# Patient Record
Sex: Male | Born: 1975 | Race: White | Hispanic: No | Marital: Single | Smoking: Former smoker
Health system: Southern US, Community
[De-identification: ages and names within clinical notes are randomized; demographics above are authoritative.]

## PROBLEM LIST (undated history)

## (undated) DIAGNOSIS — G4730 Sleep apnea, unspecified: Secondary | ICD-10-CM

## (undated) DIAGNOSIS — F909 Attention-deficit hyperactivity disorder, unspecified type: Secondary | ICD-10-CM

## (undated) DIAGNOSIS — G4733 Obstructive sleep apnea (adult) (pediatric): Secondary | ICD-10-CM

## (undated) DIAGNOSIS — K5720 Diverticulitis of large intestine with perforation and abscess without bleeding: Secondary | ICD-10-CM

## (undated) DIAGNOSIS — E119 Type 2 diabetes mellitus without complications: Secondary | ICD-10-CM

## (undated) DIAGNOSIS — E785 Hyperlipidemia, unspecified: Secondary | ICD-10-CM

## (undated) DIAGNOSIS — Z860100 Personal history of colon polyps, unspecified: Secondary | ICD-10-CM

## (undated) DIAGNOSIS — G4700 Insomnia, unspecified: Secondary | ICD-10-CM

## (undated) DIAGNOSIS — K429 Umbilical hernia without obstruction or gangrene: Secondary | ICD-10-CM

## (undated) DIAGNOSIS — I10 Essential (primary) hypertension: Secondary | ICD-10-CM

## (undated) DIAGNOSIS — E669 Obesity, unspecified: Secondary | ICD-10-CM

## (undated) HISTORY — DX: Diverticulitis of large intestine with perforation and abscess without bleeding: K57.20

## (undated) HISTORY — DX: Essential (primary) hypertension: I10

## (undated) HISTORY — PX: LASIK: SHX215

## (undated) HISTORY — DX: Obesity, unspecified: E66.9

## (undated) HISTORY — PX: COLONOSCOPY: SHX174

## (undated) HISTORY — DX: Umbilical hernia without obstruction or gangrene: K42.9

## (undated) HISTORY — DX: Sleep apnea, unspecified: G47.30

## (undated) HISTORY — DX: Obstructive sleep apnea (adult) (pediatric): G47.33

## (undated) HISTORY — DX: Type 2 diabetes mellitus without complications: E11.9

## (undated) HISTORY — DX: Hyperlipidemia, unspecified: E78.5

## (undated) HISTORY — DX: Attention-deficit hyperactivity disorder, unspecified type: F90.9

## (undated) HISTORY — PX: EYE SURGERY: SHX253

## (undated) NOTE — *Deleted (*Deleted)
PCP - DR PAZ       HE IS AWARE OF SURGERY Cardiologist -NA     -  EKG - 04/20/20 Stress Test - NA ECHO NA-  Cardiac Cath - NA  Sleep Study - YES CPAP - YES  Fasting Blood Sugar - ? Checks Blood Sugar __2___ times a day  : ERAS Protcol -YES PRE-SURGERY Ensure or G2- WATER GIVE WITH INSTRUCTIONS  COVID TEST- FOR TODAY   Anesthesia review: HX HTN  Patient denies shortness of breath, fever, cough and chest pain at PAT appointment   All instructions explained to the patient, with a verbal understanding of the material. Patient agrees to go over the instructions while at home for a better understanding. Patient also instructed to self quarantine after being tested for COVID-19. The opportunity to ask questions was provided.

---

## 2006-06-11 ENCOUNTER — Emergency Department (HOSPITAL_COMMUNITY): Admission: EM | Admit: 2006-06-11 | Discharge: 2006-06-11 | Payer: Self-pay | Admitting: Family Medicine

## 2007-01-26 ENCOUNTER — Emergency Department (HOSPITAL_COMMUNITY): Admission: EM | Admit: 2007-01-26 | Discharge: 2007-01-26 | Payer: Self-pay | Admitting: Emergency Medicine

## 2007-08-05 ENCOUNTER — Emergency Department (HOSPITAL_COMMUNITY): Admission: EM | Admit: 2007-08-05 | Discharge: 2007-08-05 | Payer: Self-pay | Admitting: Emergency Medicine

## 2007-12-10 ENCOUNTER — Emergency Department (HOSPITAL_COMMUNITY): Admission: EM | Admit: 2007-12-10 | Discharge: 2007-12-10 | Payer: Self-pay | Admitting: Family Medicine

## 2009-05-10 ENCOUNTER — Emergency Department (HOSPITAL_COMMUNITY): Admission: EM | Admit: 2009-05-10 | Discharge: 2009-05-10 | Payer: Self-pay | Admitting: Family Medicine

## 2009-10-17 ENCOUNTER — Emergency Department (HOSPITAL_COMMUNITY): Admission: EM | Admit: 2009-10-17 | Discharge: 2009-10-17 | Payer: Self-pay | Admitting: Family Medicine

## 2010-06-29 ENCOUNTER — Ambulatory Visit
Admission: RE | Admit: 2010-06-29 | Discharge: 2010-06-29 | Payer: Self-pay | Source: Home / Self Care | Attending: Internal Medicine | Admitting: Internal Medicine

## 2010-06-29 ENCOUNTER — Encounter: Payer: Self-pay | Admitting: Internal Medicine

## 2010-06-29 DIAGNOSIS — F17200 Nicotine dependence, unspecified, uncomplicated: Secondary | ICD-10-CM | POA: Insufficient documentation

## 2010-06-29 LAB — CONVERTED CEMR LAB
ALT: 27 units/L (ref 0–53)
AST: 18 units/L (ref 0–37)
Albumin: 5.1 g/dL (ref 3.5–5.2)
Alkaline Phosphatase: 84 units/L (ref 39–117)
BUN: 22 mg/dL (ref 6–23)
Basophils Absolute: 0.1 10*3/uL (ref 0.0–0.1)
Basophils Relative: 1 % (ref 0–1)
Bilirubin, Direct: 0.1 mg/dL (ref 0.0–0.3)
CO2: 21 meq/L (ref 19–32)
Calcium: 10.1 mg/dL (ref 8.4–10.5)
Chloride: 104 meq/L (ref 96–112)
Cholesterol: 236 mg/dL — ABNORMAL HIGH (ref 0–200)
Creatinine, Ser: 0.93 mg/dL (ref 0.40–1.50)
Eosinophils Absolute: 0.4 10*3/uL (ref 0.0–0.7)
Eosinophils Relative: 4 % (ref 0–5)
Glucose, Bld: 86 mg/dL (ref 70–99)
HCT: 47.7 % (ref 39.0–52.0)
HDL: 31 mg/dL — ABNORMAL LOW (ref >39)
Hemoglobin: 16.4 g/dL (ref 13.0–17.0)
Indirect Bilirubin: 0.4 mg/dL (ref 0.0–0.9)
LDL Cholesterol: 162 mg/dL — ABNORMAL HIGH (ref 0–99)
Lymphocytes Relative: 43 % (ref 12–46)
Lymphs Abs: 4.7 10*3/uL — ABNORMAL HIGH (ref 0.7–4.0)
MCHC: 34.4 g/dL (ref 30.0–36.0)
MCV: 84 fL (ref 78.0–100.0)
Monocytes Absolute: 0.7 10*3/uL (ref 0.1–1.0)
Monocytes Relative: 6 % (ref 3–12)
Neutro Abs: 5.1 10*3/uL (ref 1.7–7.7)
Neutrophils Relative %: 47 % (ref 43–77)
Platelets: 222 10*3/uL (ref 150–400)
Potassium: 4.7 meq/L (ref 3.5–5.3)
RBC: 5.68 M/uL (ref 4.22–5.81)
RDW: 14.3 % (ref 11.5–15.5)
Sodium: 139 meq/L (ref 135–145)
TSH: 1.99 microintl units/mL (ref 0.350–4.500)
Total Bilirubin: 0.5 mg/dL (ref 0.3–1.2)
Total CHOL/HDL Ratio: 7.6
Total Protein: 7.9 g/dL (ref 6.0–8.3)
Triglycerides: 214 mg/dL — ABNORMAL HIGH (ref <150)
Uric Acid, Serum: 9.8 mg/dL — ABNORMAL HIGH (ref 4.0–7.8)
VLDL: 43 mg/dL — ABNORMAL HIGH (ref 0–40)
WBC: 10.9 10*3/uL — ABNORMAL HIGH (ref 4.0–10.5)

## 2010-07-03 ENCOUNTER — Telehealth: Payer: Self-pay | Admitting: Internal Medicine

## 2010-07-17 ENCOUNTER — Encounter: Payer: Self-pay | Admitting: Internal Medicine

## 2010-07-19 NOTE — Progress Notes (Signed)
Summary: Lab Results Request/Nutrition Referral  Phone Note Call from Patient Call back at Home Phone [phone]   Caller: Patient Summary of Call: Message left on triage voicemail: Patient was seen Friday and would like to know if lab results avaliable. Patient was also told to see Nutritionist off [place], patient would like contact info or was we going to set up.   Marland KitchenShonna Chock CMA  July 03, 2010 1:36 PM   Follow-up for Phone Call        advise patient: cholesterol and uric acid moderately elevated ----> diet and weight loss will  help to get  these numbers down arrange a referral to see a nutritionist, try Claude [facility] at [place] , [facility]. WBCs are slightly elevated, needs to beRecheck when he comes back in 6 months  Follow-up by: Yitta Gongaware E. Radonna Bracher MD,  July 03, 2010 5:03 PM  Additional Follow-up for Phone Call Additional follow up Details #1::        I spoke with pt he is aware. Referral put in. Army Fossa CMA  July 04, 2010 8:55 AM \\par 

## 2010-07-19 NOTE — Assessment & Plan Note (Signed)
Summary: new to estab//ph   Vital Signs:  Patient profile:   35 year old male Height:      70.25 inches Weight:      315.50 pounds BMI:     45.11 Pulse rate:   90 / minute Pulse rhythm:   regular BP sitting:   134 / 90  (left arm) Cuff size:   regular  Vitals Entered By: Army Fossa CMA (June 29, 2010 2:47 PM) `CC: New to est. CPX Comments discuss quiting smoking possible hernia? [facility] [place] [facility] td[facility] today   History of Present Illness: CPX tried  Chantix before, would like another prescription. He was able to quit for 9 months, had no side effects except vivid dreams Interested in losing weight, admits to  no exercise, diet is "okay" but admits to need a better portion control noticed a umbilical hernia in November 2011, able to push it in without problems  Preventive Screening-Counseling & Management  Alcohol-Tobacco     Alcohol type: socially      Smoking Status: current     Packs/Day: 1 pack  Caffeine-Diet-Exercise     Does Patient Exercise: no      Drug Use:  no.    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 , started age 89 Alcohol use--yes, socially  Drug use-no Regular exercise-no  Risk Factors: Exercise: no (06/29/2010)  Risk Factors: Smoking Status: current (06/29/2010) Packs/Day: 1 pack (06/29/2010)  Past Medical History: Gout  Past Surgical History: no major   Family History: DM-- G aunt   Hypertension-- M MI-- M side  Stroke-- ?F Lung cancer-- MGF colon ca-- no prostate ca--no  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 CV:  Denies chest pain or discomfort and swelling of feet. Resp:  Denies cough, shortness of breath, and wheezing. GI:  Denies bloody stools, diarrhea, and nausea. GU:  Denies dysuria, hematuria, urinary frequency, and urinary hesitancy. Psych:  Denies anxiety and depression.  Physical Exam  General:  alert, well-developed, and overweight-appearing.   Neck:  no masses and no thyromegaly.   Lungs:  normal respiratory effort, no intercostal retractions, no accessory muscle use, and normal breath sounds.   Heart:  normal rate, regular rhythm, no murmur, and no gallop.   Abdomen:  soft, non-tender, no distention, no masses, no guarding, and no rigidity.  1.5 inch reducible, nontender umbilical hernia Extremities:  no lower extremity edema Psych:  Cognition and judgment appear intact. Alert and cooperative with normal attention span and concentration.  not anxious appearing and not depressed appearing.     Impression & Recommendations:  Problem # 1:  ROUTINE GENERAL MEDICAL EXAM@HEALTH  CARE FACL (ICD-V70.0) Td today got a flu shot this year I counseled the patient about exercise With her about different options for a healthier diet: Self reading, nutritionist referral, Weight Watchers, Optifast. He elected self education.  Orders: Venipuncture (59563) Specimen Handling (87564)  Problem # 2:  TOBACCO USER (  ICD-305.1) counseled Chantix  His updated medication list for this problem includes:    Chantix Starting Month Pak 0.5 Mg X 11 & 1 Mg X 42 Tabs (Varenicline tartrate) .Marland Kitchen... As directed    Chantix 1 Mg Tabs (Varenicline tartrate) .Marland Kitchen... 1 by mouth two times a day umbilical hernia options: Observation versus surgical referral Elected observation Aware of incarceration symptoms  Problem # 4:   GOUT (ICD-274.9) has episodes very seldom Labs Orders: Specimen Handling (60454)  Complete Medication List: 1)  Chantix Starting Month Pak 0.5 Mg X 11 & 1 Mg X 42 Tabs (Varenicline tartrate) .... As directed 2)  Chantix 1 Mg Tabs (Varenicline tartrate) .Marland Kitchen.. 1 by mouth two times a day  Other Orders: Tdap => 54yrs IM (09811) Admin 1st Vaccine (91478)  Patient Instructions: 1)  Please schedule a follow-up appointment in 6 months  to recheck your BP 2)  chantix x 3 months 3)  quit 2 weeks after you start Prescriptions: CHANTIX 1 MG TABS (VARENICLINE TARTRATE) 1 by mouth two times a day  #60 x 1   Entered and Authorized by:   Elita Quick E. Kaoir Loree MD   Signed by:   Nolon Rod. Topacio Cella MD on 06/29/2010   Method used:   Print then Give to Patient   RxID:   [id number] CHANTIX STARTING MONTH PAK 0.5 MG X 11 & 1 MG X 42 TABS (VARENICLINE TARTRATE) as directed  #1 x 0   Entered and Authorized by:   Nolon Rod. Devin Ganaway MD   Signed by:   Nolon Rod. Donnita Farina MD on 06/29/2010   Method used:   Print then Give to Patient   RxID:   [id number]    Orders Added: 1)  Tdap => 66yrs IM [90715] 2)  Admin 1st Vaccine [90471] 3)  Venipuncture [72536] 4)  Specimen Handling [99000] 5)  New Patient 18-39 years [99385]   Immunizations Administered:  Tetanus Vaccine:    Vaccine Type: Tdap    Site: right deltoid    Mfr: GlaxoSmithKline    Dose: 0.5 ml    Route: IM    Given by: Army Fossa CMA    Exp. Date: 04/05/2012    Lot #: [id number]   Immunizations Administered:  Tetanus Vaccine:    Vaccine Type: Tdap    Site: right deltoid    Mfr: GlaxoSmithKline    Dose: 0.5 ml    Route: IM    Given by: Army Fossa CMA    Exp. Date: 04/05/2012    Lot #: [id number]   Risk Factors:  Tobacco use:  current    Cigarettes:  Yes -- 1 pack pack(s) per day Drug use:  no Alcohol use:  yes    Type:  socially  Exercise:  no

## 2010-08-08 NOTE — Consult Note (Signed)
Summary: Dr. [facility]  Dr. [facility]   Imported By: Maryln Gottron 07/24/2010 15:32:15  _____________________________________________________________________  External Attachment:    Type:   Image     Comment:   External Document

## 2010-08-30 ENCOUNTER — Telehealth: Payer: Self-pay | Admitting: Internal Medicine

## 2010-09-03 ENCOUNTER — Telehealth: Payer: Self-pay | Admitting: *Deleted

## 2010-09-04 NOTE — Progress Notes (Signed)
Summary: problems w/ med   Phone Note Call from Patient Call back at Home Phone [phone]   Caller: Patient Summary of Call: Spoke w/ patient would like to know what he could do says that Chantix is making him lose focus and finding it difficult to concentrate since he is in school. Says that he has been diagnosed w/ ADD in the past. Although he has stopped smoking would like to know if there are any other decision about something he could take that may help w/ smoking cessation and concentration. Initial call taken by: Doristine Devoid CMA,  August 30, 2010 2:57 PM  Follow-up for Phone Call         if he feels that the lack of concentration is due to Chantix,  he needs to be focused due to the school,  Then he needs to discontinue Chantix (unfortunately).  We could try wellbutrin  100 mg twice a day to help with tobacco cessation.  if he has chronic lack of concentration and suspect ADHD, he needs to see psychology , confirm the diagnoses and be treated. Follow-up by: Nolon Rod. Bernadett Milian MD,  August 31, 2010 12:19 PM  Additional Follow-up for Phone Call Additional follow up Details #1::        I spoke w/ pt he states that he is going to continue w/ the Chantix this weekend, if he notices that his focus is still a problem then he will call Monday and get the Wellbutrin. Army Fossa CMA  August 31, 2010 1:19 PM

## 2010-09-13 NOTE — Progress Notes (Signed)
  Phone Note Call from Patient Call back at [facility] Phone [phone]   Summary of Call: Pt called back and states he would like to try the Wellbutrin and he needs a referral to a Psycholigist.   New Problems: * ?ADHD   New Problems: * ?ADHD New/Updated Medications: WELLBUTRIN 100 MG TABS (BUPROPION HCL) 1 by mouth two times a day. Prescriptions: WELLBUTRIN 100 MG TABS (BUPROPION HCL) 1 by mouth two times a day.  #60 x 0   Entered by:   Army Fossa CMA   Authorized by:   Nolon Rod. Paz MD   Signed by:   Army Fossa CMA on 09/03/2010   Method used:   Electronically to        [facility] [phone]* (retail)       [address]       [place], [place]  [address]       Ph: [phone]       Fax: [phone]   RxID:   [id number]

## 2011-03-11 LAB — CULTURE, FUNGUS WITHOUT SMEAR

## 2011-03-11 LAB — KOH PREP

## 2011-03-29 ENCOUNTER — Encounter: Payer: Self-pay | Admitting: Internal Medicine

## 2011-03-29 ENCOUNTER — Ambulatory Visit (INDEPENDENT_AMBULATORY_CARE_PROVIDER_SITE_OTHER): Payer: BC Managed Care – PPO | Admitting: Internal Medicine

## 2011-03-29 VITALS — BP 128/82 | HR 111 | Temp 98.3°F | Resp 18 | Wt 331.0 lb

## 2011-03-29 DIAGNOSIS — M109 Gout, unspecified: Secondary | ICD-10-CM

## 2011-03-29 DIAGNOSIS — F17200 Nicotine dependence, unspecified, uncomplicated: Secondary | ICD-10-CM

## 2011-03-29 DIAGNOSIS — R059 Cough, unspecified: Secondary | ICD-10-CM

## 2011-03-29 DIAGNOSIS — R05 Cough: Secondary | ICD-10-CM | POA: Insufficient documentation

## 2011-03-29 LAB — URIC ACID: Uric Acid, Serum: 8.5 mg/dL — ABNORMAL HIGH (ref 4.0–7.8)

## 2011-03-29 MED ORDER — AZITHROMYCIN 250 MG PO TABS: ORAL_TABLET | ORAL | Status: AC

## 2011-03-29 MED ORDER — VARENICLINE TARTRATE 0.5 MG X 11 & 1 MG X 42 PO MISC: ORAL | Status: AC

## 2011-03-29 NOTE — Assessment & Plan Note (Addendum)
See HPI, ready to try chantix x 1 month, then is considering wellbutrin Plan: chantix x 1 months Then will call : change to wellbutrin or take 2 months of additional chantix?

## 2011-03-29 NOTE — Patient Instructions (Signed)
For cough, take Mucinex DM twice a day as needed  Take the antibiotic as prescribed. zithromax Call if no better in few days Call anytime if the symptoms are severe Call after 1 month of Chantix : 2 additional months of chantix? Change to Wellbutrin? You are due for a physical at your convenience

## 2011-03-29 NOTE — Progress Notes (Signed)
  Subjective:    Patient ID: Spencer Good, male    DOB: 1976-03-05, 35 y.o.   MRN: [id number]  HPI Several issues A month ago developed a cold, it is better but continue with cough with occasional sputum production, sometimes green other times yellow. Tobacco abuse, would like to retry Chantix, in the past it helped but he had to switch to Wellbutrin because it was affecting his study habits. He still is in a [facility] program and needs to be very concentrated on reading. History of gout, would like his uric acid check, concerned about the gout causing inflammation.  Past Medical History  Diagnosis Date  . Gout   . Tobacco abuse    Past Surgical History  Procedure Date  . No past surgeries     Review of Systems Denies any fever or chills No chest congestion or wheezing No sinus pain but some congestion without postnasal dripping. Denies any podagra or acute joint pains in the last few months.     Objective:   Physical Exam  Constitutional: He is oriented to person, place, and time. He appears well-developed. No distress.       Obese appearing  HENT:  Head: Normocephalic and atraumatic.  Right Ear: External ear normal.  Left Ear: External ear normal.  Mouth/Throat: No oropharyngeal exudate.       Face symmetric, not tender to palpation  Cardiovascular: Normal rate, regular rhythm and normal heart sounds.   No murmur heard. Pulmonary/Chest: Effort normal. No respiratory distress. He has no wheezes. He has no rales.       Few rhonchi with cough, no wheezing.  Musculoskeletal: He exhibits no edema.  Neurological: He is alert and oriented to person, place, and time.  Skin: He is not diaphoretic.  Psychiatric: He has a normal mood and affect. His behavior is normal. Thought content normal.       Assessment & Plan:

## 2011-03-29 NOTE — Assessment & Plan Note (Addendum)
H/o elevated uric acid w/o obvious gout attacks. Patient wonder if elevated uric acid is causing "inflammation", I think he needs to see the bigger picture---> "inflammation" (increased CRP)  is likely cause by sedentary lifestyle, obesity and smoking. Check U acid, will consider treatment with medication only if it is extremely high. Has  indocin left over that he uses as needed

## 2011-03-29 NOTE — Assessment & Plan Note (Signed)
Atypical bronchitis? See instructions

## 2011-03-30 ENCOUNTER — Encounter: Payer: Self-pay | Admitting: Internal Medicine

## 2011-06-06 ENCOUNTER — Telehealth: Payer: Self-pay | Admitting: Internal Medicine

## 2011-06-06 NOTE — Telephone Encounter (Signed)
Patient has about a 5 days of chantix left - he would like to switch to welbutrin - [facility] he said dr Drue Novel had suggested welbutrin at [facility]

## 2011-06-07 MED ORDER — BUPROPION HCL 100 MG PO TABS: ORAL_TABLET | ORAL | Status: DC

## 2011-06-07 NOTE — Telephone Encounter (Signed)
Once he finish chantix, ok to start Bupropion 100 mg  1 a day x 2 weeks, then 1 po bid #60, 1 RF (notice is the plain bupropion not the SR or XL) Needs OV within 2 months

## 2011-06-07 NOTE — Telephone Encounter (Signed)
Discuss with patient Rx sent to pharmacy.

## 2011-07-08 ENCOUNTER — Ambulatory Visit (INDEPENDENT_AMBULATORY_CARE_PROVIDER_SITE_OTHER): Payer: BC Managed Care – PPO | Admitting: Internal Medicine

## 2011-07-08 ENCOUNTER — Encounter: Payer: Self-pay | Admitting: Internal Medicine

## 2011-07-08 DIAGNOSIS — Z0000 Encounter for general adult medical examination without abnormal findings: Secondary | ICD-10-CM

## 2011-07-08 DIAGNOSIS — Z23 Encounter for immunization: Secondary | ICD-10-CM

## 2011-07-08 DIAGNOSIS — F17200 Nicotine dependence, unspecified, uncomplicated: Secondary | ICD-10-CM

## 2011-07-08 LAB — CBC WITH DIFFERENTIAL/PLATELET
Basophils Absolute: 0.1 10*3/uL (ref 0.0–0.1)
Basophils Relative: 0.7 % (ref 0.0–3.0)
Eosinophils Absolute: 0.3 10*3/uL (ref 0.0–0.7)
Eosinophils Relative: 3 % (ref 0.0–5.0)
HCT: 43.8 % (ref 39.0–52.0)
Hemoglobin: 15 g/dL (ref 13.0–17.0)
Lymphocytes Relative: 37 % (ref 12.0–46.0)
Lymphs Abs: 3.3 10*3/uL (ref 0.7–4.0)
MCHC: 34.2 g/dL (ref 30.0–36.0)
MCV: 85.2 fl (ref 78.0–100.0)
Monocytes Absolute: 0.6 10*3/uL (ref 0.1–1.0)
Monocytes Relative: 6.3 % (ref 3.0–12.0)
Neutro Abs: 4.8 10*3/uL (ref 1.4–7.7)
Neutrophils Relative %: 53 % (ref 43.0–77.0)
Platelets: 216 10*3/uL (ref 150.0–400.0)
RBC: 5.14 Mil/uL (ref 4.22–5.81)
RDW: 14.3 % (ref 11.5–14.6)
WBC: 9 10*3/uL (ref 4.5–10.5)

## 2011-07-08 LAB — LIPID PANEL
Cholesterol: 216 mg/dL — ABNORMAL HIGH (ref 0–200)
HDL: 32 mg/dL — ABNORMAL LOW (ref >39.00)
Total CHOL/HDL Ratio: 7
Triglycerides: 241 mg/dL — ABNORMAL HIGH (ref 0.0–149.0)
VLDL: 48.2 mg/dL — ABNORMAL HIGH (ref 0.0–40.0)

## 2011-07-08 LAB — COMPREHENSIVE METABOLIC PANEL
ALT: 50 U/L (ref 0–53)
AST: 29 U/L (ref 0–37)
Albumin: 4.3 g/dL (ref 3.5–5.2)
Alkaline Phosphatase: 78 U/L (ref 39–117)
BUN: 17 mg/dL (ref 6–23)
CO2: 25 mEq/L (ref 19–32)
Calcium: 9 mg/dL (ref 8.4–10.5)
Chloride: 102 mEq/L (ref 96–112)
Creatinine, Ser: 1 mg/dL (ref 0.4–1.5)
GFR: 92.3 mL/min (ref >60.00)
Glucose, Bld: 94 mg/dL (ref 70–99)
Potassium: 3.9 mEq/L (ref 3.5–5.1)
Sodium: 138 mEq/L (ref 135–145)
Total Bilirubin: 0.6 mg/dL (ref 0.3–1.2)
Total Protein: 7.6 g/dL (ref 6.0–8.3)

## 2011-07-08 LAB — TSH: TSH: 2.73 u[IU]/mL (ref 0.35–5.50)

## 2011-07-08 LAB — LDL CHOLESTEROL, DIRECT: Direct LDL: 145.3 mg/dL

## 2011-07-08 MED ORDER — HYDROCORTISONE 1 % EX LOTN: TOPICAL_LOTION | Freq: Every day | CUTANEOUS | Status: DC

## 2011-07-08 NOTE — Assessment & Plan Note (Addendum)
Developed a facial rash consistent with seborrheic dermatitis. He also has some dryness in the scalp (Symptoms as noted before Wellbutrin, doubt allergies). We'll try steroids, recheck in 2 months Discontinue vit E

## 2011-07-08 NOTE — Assessment & Plan Note (Signed)
Patient quit tobacco with the help of Chantix and now still on  Wellbutrin. Praised, RTC 3 months

## 2011-07-08 NOTE — Assessment & Plan Note (Addendum)
Td 2012  got a flu shot today Patient quit tobacco with the help of Chantix and now still on  Wellbutrin. Praised, RTC 3 months Has also quit caffeine 2 weeks ago. We discussed diet and exercise Labs

## 2011-07-08 NOTE — Progress Notes (Signed)
  Subjective:    Patient ID: Spencer Good, male    DOB: 05-15-1976, 36 y.o.   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 He is also taking vyvanse as prescribed by psychiatry, his psychiatrist is aware that he is taking Wellbutrin. Developed a rash in the face and scalp, is itchy and scaly. Symptoms started November 2012. No chest pain or shortness of breath. No cough No nausea, vomiting, diarrhea. No ascites or depression per se.     Objective:   Physical Exam  Constitutional: He is oriented to person, place, and time. He appears well-developed. No distress.       Overweight appearing  HENT:  Head: Normocephalic and atraumatic.  Neck: No thyromegaly present.  Cardiovascular: Normal rate, regular rhythm and normal heart sounds.   No murmur heard. Pulmonary/Chest: Effort normal and breath sounds normal. No respiratory distress. He has no wheezes. He has no rales.  Abdominal: Soft. Bowel sounds are normal. He exhibits no distension. There is no tenderness. There is no rebound and no guarding.       Has a reducible umbilical hernia  Musculoskeletal: He exhibits no edema.  Neurological: He is alert and oriented to person, place, and time.  Skin: He is not diaphoretic.       Erythematous macular scaly rash on the face, mostly around the nose and eyebrows. Also in the ear; similar but milder findings in the scalp   Psychiatric: He has a normal mood and affect. His behavior is  normal. Judgment and thought content normal.      Assessment & Plan:

## 2011-07-11 ENCOUNTER — Encounter: Payer: Self-pay | Admitting: Internal Medicine

## 2011-08-02 ENCOUNTER — Telehealth: Payer: Self-pay | Admitting: Internal Medicine

## 2011-08-02 ENCOUNTER — Ambulatory Visit (INDEPENDENT_AMBULATORY_CARE_PROVIDER_SITE_OTHER): Payer: BC Managed Care – PPO | Admitting: Family Medicine

## 2011-08-02 ENCOUNTER — Encounter: Payer: Self-pay | Admitting: Family Medicine

## 2011-08-02 DIAGNOSIS — L219 Seborrheic dermatitis, unspecified: Secondary | ICD-10-CM

## 2011-08-02 MED ORDER — CICLOPIROX 1 % EX SHAM: 1.0000 "application " | MEDICATED_SHAMPOO | Freq: Every day | CUTANEOUS | Status: AC

## 2011-08-02 NOTE — Patient Instructions (Signed)
Use the shampoo daily x1 week and then twice weekly This can also be applied to eyebrows and beard Call with any questions or concerns Hang in there!

## 2011-08-02 NOTE — Telephone Encounter (Signed)
Patient has an appointment this afternoon with Dr. Katharina Caper Triage Call Report Triage Record Num: [id number] Operator: Caswell Corwin Patient Name: Spencer Good Call Date & Time: 08/02/2011 12:00:47PM Patient Phone: [phone] PCP: Nolon Rod. Paz Patient Gender: Male PCP Fax : Patient DOB: September 03, 1975 Practice Name: [facility] Reason for Call: Caller: [name]/Patient; PCP: Willow Ora; CB#: [phone]; ; ; Call regarding Rash/Hives on the face,scalp and ears. Started 11/12. The itching is intermittent and skin is flaking continuously. Pt saw Dr. Sherlyn Lick 1/13 and inst to use Hydrocortisone cream and not working. Pt has a F/U appt 09/05/11, but rash is worse now. Needs to be seen in 4 hrs. Appt made with Tabori at 1400 today. Protocol(s) Used: Rash Recommended Outcome per Protocol: See Provider within 4 hours Reason for Outcome: Evaluated by provider AND symptoms worsening when following recommended treatment plan Care Advice: ~

## 2011-08-02 NOTE — Progress Notes (Signed)
  Subjective:    Patient ID: Spencer Good, male    DOB: [birth year], 36 y.o.   MRN: [id number]  HPI Facial 'rash'- reports sxs started in Nov.  Tried OTC lotion, dandruff shampoos, hydrocortisone cream w/out relief.  Flaking in eyebrows, beard, nose, scalp, ears.  Very itchy.  Embarrassed by sxs.   Review of Systems For ROS see HPI     Objective:   Physical Exam  Vitals reviewed. Constitutional: He appears well-developed and well-nourished.  Skin: Skin is warm and dry. Rash noted. There is erythema (pt w/ flaking and scaling of eyebrows, beard, and scalp).          Assessment & Plan:

## 2011-08-04 NOTE — Assessment & Plan Note (Signed)
Deteriorated.  Start ciclopirox to treat severe sxs.  Reviewed supportive care and red flags that should prompt return.  Pt expressed understanding and is in agreement w/ plan.

## 2011-08-14 ENCOUNTER — Other Ambulatory Visit: Payer: Self-pay | Admitting: Internal Medicine

## 2011-08-14 NOTE — Telephone Encounter (Signed)
Refill done.  

## 2011-08-27 ENCOUNTER — Telehealth: Payer: Self-pay | Admitting: *Deleted

## 2011-08-27 NOTE — Telephone Encounter (Signed)
No appt available today with any providers. Pt does have pending appt schedule for 08-29-11.Marland KitchenPlease advise

## 2011-08-27 NOTE — Telephone Encounter (Signed)
Discuss with patient, appt scheduled. 

## 2011-08-27 NOTE — Telephone Encounter (Signed)
[facility] Triage Call Report Triage Record Num: [id number] Operator: Di Kindle Patient Name: Spencer Good" [name] Call Date & Time: 08/27/2011 11:32:09AM Patient Phone: [phone] PCP: Nolon Rod. Paz Patient Gender: Male PCP Fax : Patient DOB: 03/02/1976 Practice Name: [facility] Reason for Call: Caller: [name]/Patient; PCP: Willow Ora; CB#: [phone]; Call regarding Cough/Congestion; onset ` 1 week, using OTC, with acid reflux. Green productive cough, has been a smoker, stopped last three months. Guideline: Cough, with color, appt advised to be seen, offered appt 08/28/11, pt declines due to being out of town, appt 08/29/11 @ 1130 with Dr Laury Axon. OFFICE PLEASE: PT requests call if appt becomes avaialable 08/27/11, please Protocol(s) Used: Cough - Adult Recommended Outcome per Protocol: See Provider within 24 hours Reason for Outcome: Productive cough with colored sputum (other than clear or white sputum) Care Advice: ~ Use a cool mist humidifier to moisten air. Be sure to clean according to manufacturer's instructions. Call provider if fever greater than 101.5 F (38.6 C) or 100.5 F (38.1C) in an immunocompromised patient (such as diabetes, HIV/AIDS, renal disease, chemotherapy, organ transplant, or chronic steroid use) has not improved in 24 hours.

## 2011-08-27 NOTE — Telephone Encounter (Signed)
Patient returned your call.

## 2011-08-27 NOTE — Telephone Encounter (Signed)
Add on tomorrow at 4pm if he still needs to be seen; UC is an option as well

## 2011-08-28 ENCOUNTER — Ambulatory Visit (INDEPENDENT_AMBULATORY_CARE_PROVIDER_SITE_OTHER): Payer: BC Managed Care – PPO | Admitting: Internal Medicine

## 2011-08-28 VITALS — BP 168/96 | HR 108 | Temp 98.3°F | Wt 350.0 lb

## 2011-08-28 DIAGNOSIS — F17200 Nicotine dependence, unspecified, uncomplicated: Secondary | ICD-10-CM

## 2011-08-28 DIAGNOSIS — R000 Tachycardia, unspecified: Secondary | ICD-10-CM

## 2011-08-28 DIAGNOSIS — J40 Bronchitis, not specified as acute or chronic: Secondary | ICD-10-CM

## 2011-08-28 MED ORDER — AZITHROMYCIN 250 MG PO TABS: ORAL_TABLET | ORAL | Status: AC

## 2011-08-28 NOTE — Patient Instructions (Signed)
Rest, fluids , tylenol For cough, take Mucinex DM twice a day as needed   Take the antibiotic as prescribed  (zithromax ) Call if no better in few days Call anytime if the symptoms are severe  

## 2011-08-28 NOTE — Progress Notes (Signed)
  Subjective:    Patient ID: Spencer Good, male    DOB: Nov 07, 1975, 36 y.o.   MRN: [id number]  HPI Acute visit 5 days history of chest and nose congestion, mild aches and headache. Benadryl and Nasonex have not helped.  Past Medical History:  Gout  Past Surgical History:  no major   Review of Systems Quit tobacco a few months ago, doing great. Complaining of weight gain. Denies any fever or chills. Also having green nasal discharge He is having some on-and-off cough for the last 2 days with occasional green sputum. No wheezing. No nausea, vomiting, diarrhea    Objective:   Physical Exam Alert oriented x3, no apparent distress HEENT face symmetric, nontender to palpation, nose congested. Throat without redness. Ears without redness or bulging of the tympanic membranes. Lungs: Few rhonchi otherwise clear Cardiovascular : Tachycardic with a heart rate of around 100 when I checked, no murmur.       Assessment & Plan:  Bronchitis-- see instruction

## 2011-08-29 ENCOUNTER — Ambulatory Visit: Payer: BC Managed Care – PPO | Admitting: Family Medicine

## 2011-08-29 ENCOUNTER — Encounter: Payer: Self-pay | Admitting: Internal Medicine

## 2011-08-29 DIAGNOSIS — R000 Tachycardia, unspecified: Secondary | ICD-10-CM | POA: Insufficient documentation

## 2011-08-29 NOTE — Assessment & Plan Note (Signed)
Tachycardia, chronic, asx , recent labs normal EKG today-- sinus rhythm, rate 92. Due to Vyvanse ? Plan is observation

## 2011-08-29 NOTE — Assessment & Plan Note (Signed)
Quit 3months ago 

## 2011-09-05 ENCOUNTER — Ambulatory Visit: Payer: BC Managed Care – PPO | Admitting: Internal Medicine

## 2011-10-24 ENCOUNTER — Encounter: Payer: Self-pay | Admitting: Internal Medicine

## 2011-10-24 ENCOUNTER — Ambulatory Visit (INDEPENDENT_AMBULATORY_CARE_PROVIDER_SITE_OTHER): Payer: BC Managed Care – PPO | Admitting: Internal Medicine

## 2011-10-24 VITALS — BP 152/94 | HR 78 | Temp 98.1°F | Wt 355.0 lb

## 2011-10-24 DIAGNOSIS — J069 Acute upper respiratory infection, unspecified: Secondary | ICD-10-CM

## 2011-10-24 MED ORDER — PREDNISONE 10 MG PO TABS: ORAL_TABLET | ORAL | Status: DC

## 2011-10-24 MED ORDER — AMOXICILLIN 500 MG PO CAPS: 1000.0000 mg | ORAL_CAPSULE | Freq: Two times a day (BID) | ORAL | Status: AC

## 2011-10-24 MED ORDER — AZELASTINE HCL 0.1 % NA SOLN: 2.0000 | Freq: Two times a day (BID) | NASAL | Status: DC

## 2011-10-24 NOTE — Progress Notes (Signed)
  Subjective:    Patient ID: Spencer Good, male    DOB: [birth year], 36 y.o.   MRN: [id number]  HPI Acute visit Patient was taking Zyrtec daily and was doing well up until a week ago when he suddenly developed nose congestion, hoarseness, cough. 5 days ago he discontinue Zyrtec because he felt it wasn't helping. Symptoms continue about the same.  Past Medical History:  Gout  Past Surgical History:  no major    Review of Systems No fever or chills Some sneezing, no itchy eyes, nose is slightly irritated. No sore throat. Ears feel clogged and congested. Not able to sleep much at night due to the symptoms,specifically d/t nose congestion    Objective:   Physical Exam  General -- alert, well-developed, and overweight appearing. No apparent distress.  HEENT -- TM R slt red-bulge, no d/c, TM L wnl,  throat w/o redness, face symmetric and not tender to palpation, nose   congested Lungs -- normal respiratory effort, no intercostal retractions, no accessory muscle use, and few ronchi w/ cough.   Heart-- normal rate, regular rhythm, no murmur, and no gallop.   Psych-- Cognition and judgment appear intact. Alert and cooperative with normal attention span and concentration.  not anxious appearing and not depressed appearing.        Assessment & Plan:  URI-allergies: Is hard to distinguish between any upper respiratory infections or allergies. Will actually treat him for both. See instructions.

## 2011-10-24 NOTE — Patient Instructions (Signed)
Rest, fluids , go back to Zyrtec daily. For cough, take Mucinex DM twice a day as needed  For congestion use astelin nasal spray twice a day until you feel better Take the antibiotic as prescribed  (Amoxicillin) Prednisone as prescribed for 9 days Call if no better in few days Call anytime if the symptoms are severe

## 2011-12-13 ENCOUNTER — Other Ambulatory Visit: Payer: Self-pay | Admitting: Internal Medicine

## 2012-01-15 ENCOUNTER — Other Ambulatory Visit: Payer: Self-pay | Admitting: Internal Medicine

## 2012-01-15 NOTE — Telephone Encounter (Signed)
Refill done.  

## 2012-02-10 ENCOUNTER — Telehealth: Payer: Self-pay | Admitting: *Deleted

## 2012-02-10 NOTE — Telephone Encounter (Signed)
Discuss with patient  

## 2012-02-10 NOTE — Telephone Encounter (Signed)
Pt states that he was taking the Wellbutrin to quit smoking. Pt notes that he has since stop smoking and would like to know how to go about stopping the Wellbutrin.Please advise

## 2012-02-10 NOTE — Telephone Encounter (Signed)
Decrease to one tablet daily for 2 weeks, then stop Wellbutrin. Call if problems

## 2012-10-01 ENCOUNTER — Encounter: Payer: Self-pay | Admitting: Family Medicine

## 2012-10-01 ENCOUNTER — Ambulatory Visit (INDEPENDENT_AMBULATORY_CARE_PROVIDER_SITE_OTHER): Payer: BC Managed Care – PPO | Admitting: Family Medicine

## 2012-10-01 VITALS — BP 122/76 | HR 113 | Temp 99.1°F | Wt 352.4 lb

## 2012-10-01 DIAGNOSIS — J40 Bronchitis, not specified as acute or chronic: Secondary | ICD-10-CM

## 2012-10-01 MED ORDER — CEFUROXIME AXETIL 500 MG PO TABS: 500.0000 mg | ORAL_TABLET | Freq: Two times a day (BID) | ORAL | Status: DC

## 2012-10-01 MED ORDER — GUAIFENESIN-CODEINE 100-10 MG/5ML PO SYRP: ORAL_SOLUTION | ORAL | Status: DC

## 2012-10-01 MED ORDER — AZITHROMYCIN 250 MG PO TABS: ORAL_TABLET | ORAL | Status: DC

## 2012-10-01 NOTE — Progress Notes (Signed)
  Subjective:     Spencer Good is a 37 y.o. male who presents for evaluation of sinus pain. Symptoms include: congestion, cough, facial pain, nasal congestion and sinus pressure. Onset of symptoms was 3 weeks ago. Symptoms have been gradually worsening since that time. Past history is significant for no history of pneumonia or bronchitis. Patient is a former smoker.   The following portions of the patient's history were reviewed and updated as appropriate: allergies, current medications, past family history, past medical history, past social history, past surgical history and problem list.  Review of Systems Pertinent items are noted in HPI.   Objective:    BP 122/76  Pulse 113  Temp(Src) 99.1 F (37.3 C) (Oral)  Wt 352 lb 6.4 oz (159.848 kg)  BMI 47.78 kg/m2  SpO2 96% General appearance: alert, cooperative, appears stated age and no distress Ears: normal TM's and external ear canals both ears Nose: green discharge, mild congestion, turbinates red, swollen, sinus tenderness bilateral Throat: lips, mucosa, and tongue normal; teeth and gums normal Neck: no adenopathy, no carotid bruit, no JVD, supple, symmetrical, trachea midline and thyroid not enlarged, symmetric, no tenderness/mass/nodules Lungs: dec breath sounds Heart: S1, S2 normal    Assessment:    Acute bacterial bronchitis.    Plan:    Nasal steroids per medication orders. Antihistamines per medication orders. Zithromax per medication orders.

## 2012-10-01 NOTE — Patient Instructions (Addendum)

## 2012-12-01 ENCOUNTER — Encounter: Payer: Self-pay | Admitting: Internal Medicine

## 2012-12-01 ENCOUNTER — Ambulatory Visit (INDEPENDENT_AMBULATORY_CARE_PROVIDER_SITE_OTHER): Payer: BC Managed Care – PPO | Admitting: Internal Medicine

## 2012-12-01 VITALS — BP 132/82 | HR 96 | Temp 98.6°F | Ht 72.5 in | Wt 325.0 lb

## 2012-12-01 DIAGNOSIS — Z0000 Encounter for general adult medical examination without abnormal findings: Secondary | ICD-10-CM

## 2012-12-01 DIAGNOSIS — K429 Umbilical hernia without obstruction or gangrene: Secondary | ICD-10-CM | POA: Insufficient documentation

## 2012-12-01 DIAGNOSIS — F17200 Nicotine dependence, unspecified, uncomplicated: Secondary | ICD-10-CM

## 2012-12-01 DIAGNOSIS — F909 Attention-deficit hyperactivity disorder, unspecified type: Secondary | ICD-10-CM

## 2012-12-01 LAB — CBC WITH DIFFERENTIAL/PLATELET
Basophils Absolute: 0 10*3/uL (ref 0.0–0.1)
Basophils Relative: 0.3 % (ref 0.0–3.0)
Eosinophils Absolute: 0.4 10*3/uL (ref 0.0–0.7)
Eosinophils Relative: 3.3 % (ref 0.0–5.0)
HCT: 43 % (ref 39.0–52.0)
Hemoglobin: 14.6 g/dL (ref 13.0–17.0)
Lymphocytes Relative: 29.5 % (ref 12.0–46.0)
Lymphs Abs: 3.8 10*3/uL (ref 0.7–4.0)
MCHC: 33.9 g/dL (ref 30.0–36.0)
MCV: 84.3 fl (ref 78.0–100.0)
Monocytes Absolute: 0.7 10*3/uL (ref 0.1–1.0)
Monocytes Relative: 5.3 % (ref 3.0–12.0)
Neutro Abs: 8 10*3/uL — ABNORMAL HIGH (ref 1.4–7.7)
Neutrophils Relative %: 61.6 % (ref 43.0–77.0)
Platelets: 255 10*3/uL (ref 150.0–400.0)
RBC: 5.1 Mil/uL (ref 4.22–5.81)
RDW: 14.8 % — ABNORMAL HIGH (ref 11.5–14.6)
WBC: 12.9 10*3/uL — ABNORMAL HIGH (ref 4.5–10.5)

## 2012-12-01 LAB — COMPREHENSIVE METABOLIC PANEL
ALT: 51 U/L (ref 0–53)
AST: 22 U/L (ref 0–37)
Albumin: 3.6 g/dL (ref 3.5–5.2)
Alkaline Phosphatase: 112 U/L (ref 39–117)
BUN: 8 mg/dL (ref 6–23)
CO2: 22 mEq/L (ref 19–32)
Calcium: 9.2 mg/dL (ref 8.4–10.5)
Chloride: 106 mEq/L (ref 96–112)
Creatinine, Ser: 0.9 mg/dL (ref 0.4–1.5)
GFR: 97.17 mL/min (ref >60.00)
Glucose, Bld: 142 mg/dL — ABNORMAL HIGH (ref 70–99)
Potassium: 4.3 mEq/L (ref 3.5–5.1)
Sodium: 138 mEq/L (ref 135–145)
Total Bilirubin: 0.4 mg/dL (ref 0.3–1.2)
Total Protein: 7.2 g/dL (ref 6.0–8.3)

## 2012-12-01 LAB — LIPID PANEL
Cholesterol: 212 mg/dL — ABNORMAL HIGH (ref 0–200)
HDL: 22.5 mg/dL — ABNORMAL LOW (ref >39.00)
Total CHOL/HDL Ratio: 9
Triglycerides: 190 mg/dL — ABNORMAL HIGH (ref 0.0–149.0)
VLDL: 38 mg/dL (ref 0.0–40.0)

## 2012-12-01 LAB — URIC ACID: Uric Acid, Serum: 6.3 mg/dL (ref 4.0–7.8)

## 2012-12-01 MED ORDER — VARENICLINE TARTRATE 0.5 MG X 11 & 1 MG X 42 PO MISC: ORAL | Status: DC

## 2012-12-01 MED ORDER — VARENICLINE TARTRATE 1 MG PO TABS: 1.0000 mg | ORAL_TABLET | Freq: Two times a day (BID) | ORAL | Status: DC

## 2012-12-01 NOTE — Assessment & Plan Note (Signed)
See CPX

## 2012-12-01 NOTE — Assessment & Plan Note (Signed)
Size seems stable compared to previous visit, at some point may need to be referred to surgery, incarceration  signs and symptoms Discussed

## 2012-12-01 NOTE — Assessment & Plan Note (Addendum)
Td 2012  Patient quit tobacco before with the help of Chantix , also used  Wellbutrin before. Likes to try Chantix again, rx provided  Diet-- was able to loose wt following a program, praised, rec to get a sustainable program Exercise-- starting to be more active, praised OSA ??: BMI 43, ++ snoring, not sleepy (d/t vyvanse?) , will consider sleep study, rec to continue efforts to loose wt Labs RTC 6 months

## 2012-12-01 NOTE — Patient Instructions (Addendum)
Next visit 6 months 

## 2012-12-01 NOTE — Progress Notes (Signed)
  Subjective:    Patient ID: Spencer Good, male    DOB: 26-Dec-1975, 37 y.o.   MRN: [id number]  HPI CPX  Past Medical History  Diagnosis Date  . Gout   . Tobacco abuse   . ADHD (attention deficit hyperactivity disorder)     per Dr Tomasa Rand   Past Surgical History  Procedure Laterality Date  . No past surgeries     History   Social History  . Marital Status: Single    Spouse Name: N/A    Number of Children: 0  . Years of Education: N/A   Occupational History  . auto finance    Social History Main Topics  . Smoking status: Light Tobacco Smoker  . Smokeless tobacco: Never Used     Comment: quits on-off  . Alcohol Use: Yes     Comment: rarely  . Drug Use: No  . Sexually Active: Not on file   Other Topics Concern  . Not on file   Social History Narrative  . No narrative on file   Family History  Problem Relation Age of Onset  . Colon cancer Neg Hx   . Prostate cancer Neg Hx   . CAD Other     MGF  . Stroke Father     F, PGF  . Diabetes Neg Hx     distant fam members    Review of Systems Diet, was able to lose approximately 75 pounds during 1500-calorie diet, was taking some natural remedies along with program. Started to be more active. No chest pain or shortness of breath No nausea, vomiting, diarrhea or blood in the stools. No dysuria gross hematuria. No anxiety or depression. Patient reports snoring, energy level is normal, does not feel sleepy regular basis (takes vyvanse)     Objective:   Physical Exam BP 132/82  Pulse 96  Temp(Src) 98.6 F (37 C) (Oral)  Ht 6' 0.5" (1.842 m)  Wt 325 lb (147.419 kg)  BMI 43.45 kg/m2  SpO2 97%  General -- alert, well-developed, NAD .   Neck --no thyromegaly , normal carotid pulse Lungs -- normal respiratory effort, no intercostal retractions, no accessory muscle use, and normal breath sounds.   Heart-- normal rate, regular rhythm, no murmur, and no gallop.   Abdomen--soft, non-tender, no distention;  reducible, nontender umbilical hernia size approximately 2 cm  Extremities-- no pretibial edema bilaterally  Neurologic-- alert & oriented X3 and strength normal in all extremities. Psych-- Cognition and judgment appear intact. Alert and cooperative with normal attention span and concentration.  not anxious appearing and not depressed appearing.       Assessment & Plan:

## 2012-12-02 LAB — LDL CHOLESTEROL, DIRECT: Direct LDL: 157.1 mg/dL

## 2012-12-02 LAB — TSH: TSH: 2.2 u[IU]/mL (ref 0.35–5.50)

## 2012-12-03 ENCOUNTER — Ambulatory Visit: Payer: BC Managed Care – PPO

## 2012-12-03 DIAGNOSIS — R7309 Other abnormal glucose: Secondary | ICD-10-CM

## 2012-12-03 LAB — HEMOGLOBIN A1C: Hgb A1c MFr Bld: 7.1 % — ABNORMAL HIGH (ref 4.6–6.5)

## 2012-12-04 ENCOUNTER — Telehealth: Payer: Self-pay | Admitting: Internal Medicine

## 2012-12-04 ENCOUNTER — Encounter: Payer: BC Managed Care – PPO | Admitting: Internal Medicine

## 2012-12-04 NOTE — Telephone Encounter (Signed)
Please call the patient: His results showed mild diabetes and elevated cholesterol. CBC show a slightly elevated white count. Definitely needs to keep  improve his diet and exercise more frequently. Plan: Arrange a nutritionist referral Start metformin 150 mg one tablet twice a day #60 and 3 refills. Advise patient to take only half tablet of metformin twice a day the first week so he get use to it. If he develops side effects (persistent nausea, vomiting, diarrhea) let me know OV 3 months , Will recheck his cholesterol then and decide if he needs medication

## 2012-12-04 NOTE — Telephone Encounter (Signed)
Called pt, he states he would have to call me back.

## 2012-12-08 NOTE — Telephone Encounter (Signed)
Thank you :)

## 2012-12-08 NOTE — Telephone Encounter (Signed)
Discussed with pt, he declined the nutritionist referral as well as the new medication metformin. Pt states he would like to work on losing weight first.  Scheduled pt for a 3 month follow-up.

## 2012-12-29 ENCOUNTER — Other Ambulatory Visit: Payer: Self-pay | Admitting: Internal Medicine

## 2012-12-29 NOTE — Telephone Encounter (Signed)
Spoke with pharmacy, refill still left on file. Request sent in error.

## 2013-02-17 ENCOUNTER — Ambulatory Visit (INDEPENDENT_AMBULATORY_CARE_PROVIDER_SITE_OTHER): Payer: BC Managed Care – PPO | Admitting: Internal Medicine

## 2013-02-17 ENCOUNTER — Encounter: Payer: Self-pay | Admitting: Internal Medicine

## 2013-02-17 VITALS — BP 127/87 | HR 92 | Temp 98.1°F | Wt 349.4 lb

## 2013-02-17 DIAGNOSIS — E119 Type 2 diabetes mellitus without complications: Secondary | ICD-10-CM

## 2013-02-17 MED ORDER — METFORMIN HCL 850 MG PO TABS: 850.0000 mg | ORAL_TABLET | Freq: Two times a day (BID) | ORAL | Status: DC

## 2013-02-17 NOTE — Assessment & Plan Note (Addendum)
Diagnosed with diabetes with an A1c 7.1 ~ 2 months ago. Today I spent more than 15 minutes counseling the patient about diet, exercise. We discussed the concept hemoglobin A1c- CBG goals Plan: Referral to a nutritionist Ms. Spagnola He was recommended to start metformin but he was reluctant to do that, at this time he is willing to start--->  side effects discussed Improve diet and exercise, do some self learning. Followup in 2 months

## 2013-02-17 NOTE — Patient Instructions (Signed)
Start metformin one tablet a day for one week, then one tablet twice a day Watch for side effects Next visit in 2 months

## 2013-02-17 NOTE — Progress Notes (Signed)
  Subjective:    Patient ID: Spencer Good, male    DOB: [birth year], 37 y.o.   MRN: [id number]  HPI Visit. Since the last time he was here he was diagnosed with diabetes.  Past Medical History  Diagnosis Date  . Gout   . Tobacco abuse   . ADHD (attention deficit hyperactivity disorder)     per Dr Tomasa Rand   Past Surgical History  Procedure Laterality Date  . No past surgeries        Review of Systems In general feels well he He quit tobacco almost completely, on Chantix. Since he learned he has diabetes he is eating better however has been weight he thinks related to quit tobacco. Still not exercising as much as he needs to.    Objective:   Physical Exam BP 127/87  Pulse 92  Temp(Src) 98.1 F (36.7 C)  Wt 349 lb 6.4 oz (158.487 kg)  BMI 46.71 kg/m2  SpO2 97%  General -- alert, well-developed, NAD.   Psych-- Cognition and judgment appear intact. Alert and cooperative with normal attention span and concentration. not anxious appearing and not depressed appearing.         Assessment & Plan:

## 2013-03-01 ENCOUNTER — Ambulatory Visit: Payer: BC Managed Care – PPO | Admitting: Internal Medicine

## 2013-04-16 ENCOUNTER — Encounter: Payer: Self-pay | Admitting: Dietician

## 2013-04-16 ENCOUNTER — Encounter: Payer: BC Managed Care – PPO | Attending: Internal Medicine | Admitting: Dietician

## 2013-04-16 VITALS — Ht 72.5 in | Wt 360.4 lb

## 2013-04-16 DIAGNOSIS — E119 Type 2 diabetes mellitus without complications: Secondary | ICD-10-CM | POA: Insufficient documentation

## 2013-04-16 DIAGNOSIS — Z713 Dietary counseling and surveillance: Secondary | ICD-10-CM | POA: Insufficient documentation

## 2013-04-16 NOTE — Progress Notes (Signed)
Appt start time: 1100 end time:  1200.  Assessment:  Patient was seen on 31 OCT for individual diabetes education. Pt comes in today with recent Dx of type II DM, some hx of effective weight loss with diet and appetite suppressant usage. He states his normal weight is about 350 lbs, with some recent wt gain likely related to quitting smoking. He also noted a previous appt with an RD which he considered unimpressive. He admits to issues with portion control and overeating during after dinner snack period. He also expresses a good amount of basic knowledge of high fat, high sugar, and high calorie foods and some ability to cook. He states he is still in a bit of a state of denial in regards to his DM Dx, and is struggling with the thought of referring to himself as a diabetic for the remainder of his life.   Current HbA1c: 7.1 Other pertinent labs: TC 212, TG 190, HDL 22.5  Preferred Learning Style:   Auditory  Visual  Learning Readiness:   Contemplating  MEDICATIONS: see list.  DIETARY INTAKE: Usual eating pattern includes 2 meals and 1 snacks per day. Everyday foods include unsweet tea, water.  Avoided foods include none noted.    24-hr recall:  B ( AM): nothing or fast food biscuit and hash browns. Unsweet tea.   Snk ( AM): none  L ( PM): fast food and buffet foods most of time.  Snk ( PM): none D ( PM): when cooking, will have grilled chix or steak, asparagus, salad. Cooking comes in bunches (1 week on, 1 week off). When cooks, may take leftovers to work, or cook a separate meal.  Snk ( PM): inconsistent, but he can "sit down and just destroy food". Choices include mostly sweets like cookies or salty snacks like chips.  Beverages: water, sweet tea, occasionally large dose of soda, few drinks EtOH per month.  Pt admits to portion and impulse control issues driving Hx of obesity.   Usual physical activity: minimal. He claims to hate walking, but wants to do more anyway for his dog.  He does enjoy cycling, and in the past could do 20-30 miles in a single ride without issue other than limited time.   Progress Towards Goal(s):  In progress.   Nutritional Diagnosis:  NI-5.8.2 Excessive carbohydrate intake as related to DM type II, poor portion control, bouts of overeating snack foods as evidenced by HgbA1c 7.1, BMI>40.    Intervention:  Nutrition counseling provided.  Discussed diabetes disease process and treatment options.  Discussed physiology of diabetes and role of obesity on insulin resistance.  Encouraged moderate weight reduction to improve glucose levels.  Discussed role of medications and diet in glucose control  Provided education on macronutrients on glucose levels.  Provided education on carb counting, importance of regularly scheduled meals/snacks, and meal planning  Discussed effects of physical activity on glucose levels and long-term glucose control.  Recommended 150 minutes of physical activity/week.  Reviewed patient medications.  Discussed role of medication on blood glucose and possible side effects  Discussed blood glucose monitoring and interpretation.  Discussed recommended target ranges and individual ranges.    Described short-term complications: hyper- and hypo-glycemia.  Discussed causes,symptoms, and treatment options.  Discussed prevention, detection, and treatment of long-term complications.  Discussed the role of prolonged elevated glucose levels on body systems.  Discussed role of stress on blood glucose levels and discussed strategies to manage psychosocial issues.  Discussed recommendations for long-term diabetes self-care.  Established checklist for medical, dental, and emotional self-care.  Teaching Method Utilized: Visual Auditory  Handouts given during visit include:  Living Well with DM  Lean Protein options  Barriers to learning/adherence to lifestyle change: self-control.   Diabetes self-care support plan:   [facility]  support group  RD also reviewed with pt best options for eating out at a variety of restaurants, snack options, goals for physical activity (150 minutes per week now, progressing towards 420 minutes per week within 6 months).  Demonstrated degree of understanding via:  Teach Back   Monitoring/Evaluation:  Dietary intake, exercise, portion control, and body weight in 6 week(s).

## 2013-04-21 ENCOUNTER — Encounter: Payer: Self-pay | Admitting: Internal Medicine

## 2013-04-21 ENCOUNTER — Ambulatory Visit (INDEPENDENT_AMBULATORY_CARE_PROVIDER_SITE_OTHER): Payer: BC Managed Care – PPO | Admitting: Internal Medicine

## 2013-04-21 VITALS — BP 156/99 | HR 95 | Temp 98.4°F

## 2013-04-21 DIAGNOSIS — R0683 Snoring: Secondary | ICD-10-CM

## 2013-04-21 DIAGNOSIS — E119 Type 2 diabetes mellitus without complications: Secondary | ICD-10-CM

## 2013-04-21 DIAGNOSIS — R0609 Other forms of dyspnea: Secondary | ICD-10-CM

## 2013-04-21 DIAGNOSIS — L219 Seborrheic dermatitis, unspecified: Secondary | ICD-10-CM

## 2013-04-21 DIAGNOSIS — F17200 Nicotine dependence, unspecified, uncomplicated: Secondary | ICD-10-CM

## 2013-04-21 DIAGNOSIS — R0989 Other specified symptoms and signs involving the circulatory and respiratory systems: Secondary | ICD-10-CM

## 2013-04-21 LAB — ALT: ALT: 77 U/L — ABNORMAL HIGH (ref 0–53)

## 2013-04-21 LAB — AST: AST: 36 U/L (ref 0–37)

## 2013-04-21 LAB — HEMOGLOBIN A1C: Hgb A1c MFr Bld: 8.6 % — ABNORMAL HIGH (ref 4.6–6.5)

## 2013-04-21 NOTE — Progress Notes (Signed)
  Subjective:    Patient ID: Spencer Good, male    DOB: 1976-06-12, 37 y.o.   MRN: [id number]  HPI Followup visit Diabetes- saw a nutritionist, it was helpful . Decided not to take ambulatory CBGs which is okay. Good compliance with metformin. Seborrheic dermatitis--patient likes to see a dermatologist, will arrange Snoring--in the past we talked about his heavy snoring and would like to be tested. Tobacco abuse--finished Chantix, he quit tobacco!  Past Medical History  Diagnosis Date  . Gout   . Tobacco abuse   . ADHD (attention deficit hyperactivity disorder)     per Dr Tomasa Rand  . Diabetes    Past Surgical History  Procedure Laterality Date  . No past surgeries     History   Social History  . Marital Status: Single    Spouse Name: N/A    Number of Children: 0  . Years of Education: N/A   Occupational History  . auto finance    Social History Main Topics  . Smoking status: Former Smoker    Types: Cigarettes    Quit date: 12/24/2012  . Smokeless tobacco: Never Used     Comment: quits on-off  . Alcohol Use: Yes     Comment: rarely  . Drug Use: No  . Sexual Activity: Not on file   Other Topics Concern  . Not on file   Social History Narrative  . No narrative on file     Review of Systems Still not exercising as much as he would like to. Denies feeling sleepy throughout the day, takes ADHD medication Denies any nausea, vomiting, diarrhea or abdominal pain. No lower extremity paresthesias.     Objective:   Physical Exam  BP 156/99  Pulse 95  Temp(Src) 98.4 F (36.9 C)  SpO2 99% General -- alert, well-developed, NAD.  HEENT-- scaly -dry skin throughout the face Lungs -- normal respiratory effort, no intercostal retractions, no accessory muscle use, and normal breath sounds.  Heart-- normal rate, regular rhythm, no murmur.   Extremities-- no pretibial edema bilaterally  Neurologic--  alert & oriented X3. Speech normal, gait normal, strength  normal in all extremities.   Psych-- Cognition and judgment appear intact. Cooperative with normal attention span and concentration. No anxious appearing , no depressed appearing.      Assessment & Plan:

## 2013-04-21 NOTE — Assessment & Plan Note (Signed)
Requests a referral to dermatology, will arrange

## 2013-04-21 NOTE — Assessment & Plan Note (Addendum)
Saw a nutritionist, diet improved. No ambulatory CBGs, good compliance with metformin Plan: A1c, LFTs, exercise

## 2013-04-21 NOTE — Patient Instructions (Signed)
Get your blood work before you leave  Next visit in 3 months for a  diabetes   follow up . Fasting Please make an appointment

## 2013-04-21 NOTE — Assessment & Plan Note (Signed)
Quit after chantix! Praised

## 2013-04-21 NOTE — Assessment & Plan Note (Addendum)
Patient is concerned about  sleep apnea, he snores heavily but not sleepy throughout the day probably b/c  he takes vyvanse which may mask his symptoms. Plan: Sleep study, needs to be done before the end of the year

## 2013-04-27 MED ORDER — CANAGLIFLOZIN 100 MG PO TABS: 100.0000 mg | ORAL_TABLET | Freq: Every day | ORAL | Status: DC

## 2013-04-27 NOTE — Addendum Note (Signed)
Addended by: Eustace Quail on: 04/27/2013 02:34 PM   Modules accepted: Orders

## 2013-05-07 ENCOUNTER — Telehealth: Payer: Self-pay | Admitting: Internal Medicine

## 2013-05-07 NOTE — Telephone Encounter (Signed)
Can you place pulmonary referral for this patient please? His order for home sleep study also needs to be deleted/cancelled. Thank you.

## 2013-05-07 NOTE — Telephone Encounter (Signed)
Message copied by Marshell Garfinkel on Fri May 07, 2013  3:37 PM ------      Message from: Wanda Plump      Created: Tue May 04, 2013  5:16 PM      Regarding: RE: home sleep study       Arrange a pulmonary referral please                  ----- Message -----         From: Marshell Garfinkel         Sent: 05/04/2013   9:29 AM           To: Wanda Plump, MD      Subject: home sleep study                                         Dr. Drue Novel,      Home sleep study for this patient was denied by [facility]. Would you like to refer the patient to [facility] and let them take over, or would you like to see if they will cover the study in the sleep lab?       ------

## 2013-05-11 ENCOUNTER — Other Ambulatory Visit: Payer: Self-pay | Admitting: *Deleted

## 2013-05-11 DIAGNOSIS — R0683 Snoring: Secondary | ICD-10-CM

## 2013-05-11 NOTE — Telephone Encounter (Signed)
Referral rec'd. Can you cancel order for home sleep study? Thank you!

## 2013-05-11 NOTE — Telephone Encounter (Signed)
Home sleep study discontinued. JG//CMA

## 2013-05-11 NOTE — Telephone Encounter (Signed)
Referral entered into epic. JG//CMA

## 2013-05-24 ENCOUNTER — Encounter: Payer: BC Managed Care – PPO | Attending: Internal Medicine | Admitting: Dietician

## 2013-05-24 ENCOUNTER — Encounter: Payer: Self-pay | Admitting: Dietician

## 2013-05-24 VITALS — Ht 72.5 in | Wt 354.6 lb

## 2013-05-24 DIAGNOSIS — E119 Type 2 diabetes mellitus without complications: Secondary | ICD-10-CM

## 2013-05-24 DIAGNOSIS — Z713 Dietary counseling and surveillance: Secondary | ICD-10-CM | POA: Insufficient documentation

## 2013-05-24 NOTE — Progress Notes (Signed)
Spencer Good A: Pt reports at 354 lbs 9.6 oz. This is about 6 lb wt loss since 04/16/13. Pt began eating breakfast more often, usually eggs over spinach. However, this is not daily yet.  Pt has been altering lunch and dinner choices, getting some balance between higher fat, higher kcal lunch with lower kcal dinner with more veg. He has enjoyed more cooking lately, but this has been interrupted some by work travel. The travel has also been interrupting his grocery store plans and buying.  He admits to some more regular soda intake.  No regular workout schedule as of yet, largely affected by travel schedule.  Pt has reported a good amount of stress related to work lately, but has made a number of positive steps, including much more water drinking.   I: Continue plan of care. RD emphasized introducing some low to moderate intensity exercises available at a hotel gym as a means of controlling stress, aiding weight loss progress. Pt will see the sleep center on the 18th of December, and is hopeful some improvements in his sleep patterns may aid his wt loss efforts. RD reviewed a number of eating out options discussed during last encounter, strategies to control intake.   M/E: Monitor portion control, weight, exercise, and labs. F/U within 3 months.

## 2013-05-28 ENCOUNTER — Ambulatory Visit: Payer: BC Managed Care – PPO | Admitting: *Deleted

## 2013-06-01 ENCOUNTER — Ambulatory Visit: Payer: BC Managed Care – PPO | Admitting: Internal Medicine

## 2013-06-03 ENCOUNTER — Ambulatory Visit (INDEPENDENT_AMBULATORY_CARE_PROVIDER_SITE_OTHER): Payer: BC Managed Care – PPO | Admitting: Pulmonary Disease

## 2013-06-03 ENCOUNTER — Encounter: Payer: Self-pay | Admitting: Pulmonary Disease

## 2013-06-03 VITALS — BP 144/86 | HR 95 | Temp 98.0°F | Ht 72.0 in | Wt 356.0 lb

## 2013-06-03 DIAGNOSIS — G4733 Obstructive sleep apnea (adult) (pediatric): Secondary | ICD-10-CM

## 2013-06-03 NOTE — Assessment & Plan Note (Signed)
The patient's history is very suggestive of clinically significant sleep apnea. He is obese, has an abnormal upper airway, loud snoring, nonrestorative sleep, and sleepiness with inactivity during the day on an intermittent basis. It had a long discussion with him about the pathophysiology of sleep apnea, including its impact to his quality of life and cardiovascular health. At this point, I think he needs to have a sleep study done, and is an excellent candidate for home sleep testing. Patient is agreeable to this approach.

## 2013-06-03 NOTE — Progress Notes (Signed)
Subjective:    Patient ID: Spencer Good, male    DOB: [birth year], 37 y.o.   MRN: [id number]  HPI The patient is a 37 year old male who I've been asked to see for possible obstructive sleep apnea. He has been noted to have loud snoring, but does not have a consistent bed partner who can comment on an abnormal breathing pattern during sleep. He has had a history of choking arousals. He has increased awakenings intermittently, and never feels rested in the mornings upon arising regardless of his quantity of sleep. He states that he has decreased concentration and alertness during the day while at work, but has intermittent true sleepiness primarily in the afternoons. He also has intermittent sleepiness in the evenings watching television or movies. He does admit to having sleep pressure driving longer distances. The patient states that his weight is up a few pounds over the last few years, and his Epworth score today is very abnormal at 14.   Sleep Questionnaire What time do you typically go to bed?( Between what hours) 11pm - 1am 11pm - 1am at 1132 on 06/03/13 by Raford Pitcher, RN How long does it take you to fall asleep? 10 minutes 10 minutes at 1132 on 06/03/13 by Raford Pitcher, RN How many times during the night do you wake up? 22 2-3 at 1132 on 06/03/13 by Raford Pitcher, RN What time do you get out of bed to start your day? [id number] 7am-8am at 1132 on 06/03/13 by Raford Pitcher, RN Do you drive or operate heavy machinery in your occupation? No No at 1132 on 06/03/13 by Raford Pitcher, RN How much has your weight changed (up or down) over the past two years? (In pounds) 0 oz (0 kg) 0 oz (0 kg) at 1132 on 06/03/13 by Raford Pitcher, RN Have you ever had a sleep study before? No No at 1132 on 06/03/13 by Raford Pitcher, RN Do you currently use CPAP? No No at 1132 on 06/03/13 by Raford Pitcher, RN Do you wear oxygen at any time? No No at 1132 on  06/03/13 by Raford Pitcher, RN   Review of Systems  Constitutional: Negative for fever and unexpected weight change.  HENT: Negative for congestion, dental problem, ear pain, nosebleeds, postnasal drip, rhinorrhea, sinus pressure, sneezing, sore throat and trouble swallowing.   Eyes: Positive for redness. Negative for itching.  Respiratory: Negative for cough, chest tightness, shortness of breath and wheezing.   Cardiovascular: Negative for palpitations and leg swelling.  Gastrointestinal: Negative for nausea and vomiting.  Genitourinary: Negative for dysuria.  Musculoskeletal: Negative for joint swelling.  Skin: Negative for rash.  Neurological: Negative for headaches.  Hematological: Does not bruise/bleed easily.  Psychiatric/Behavioral: Negative for dysphoric mood. The patient is not nervous/anxious.        Objective:   Physical Exam Constitutional:  Obese male, no acute distress  HENT:  Nares patent without discharge, +large turbinates  Oropharynx without exudate, palate and uvula are very thick and elongated.  Small posterior pharynx.   Eyes:  Perrla, eomi, no scleral icterus  Neck:  No JVD, no TMG  Cardiovascular:  Normal rate, regular rhythm, no rubs or gallops.  No murmurs        Intact distal pulses  Pulmonary :  Normal breath sounds, no stridor or respiratory distress   No rales, rhonchi, or wheezing  Abdominal:  Soft, nondistended, bowel sounds present.  No tenderness noted.   Musculoskeletal:  No lower extremity edema noted.  Lymph Nodes:  No cervical lymphadenopathy noted  Skin:  No cyanosis noted  Neurologic: appears sleepy but appropriate, moves all 4 extremities without obvious deficit.         Assessment & Plan:

## 2013-06-03 NOTE — Patient Instructions (Signed)
Will see if we can get home study approved with your insurance.  If not, will schedule for sleep center. Will arrange followup with me once results are available.

## 2013-06-16 DIAGNOSIS — G4733 Obstructive sleep apnea (adult) (pediatric): Secondary | ICD-10-CM

## 2013-06-25 DIAGNOSIS — G4733 Obstructive sleep apnea (adult) (pediatric): Secondary | ICD-10-CM

## 2013-06-26 ENCOUNTER — Other Ambulatory Visit: Payer: Self-pay | Admitting: Internal Medicine

## 2013-06-28 NOTE — Telephone Encounter (Signed)
Metformin refilled per protocol. JG//CMA

## 2013-06-29 ENCOUNTER — Encounter: Payer: Self-pay | Admitting: Pulmonary Disease

## 2013-07-28 ENCOUNTER — Other Ambulatory Visit: Payer: Self-pay | Admitting: Internal Medicine

## 2013-08-03 ENCOUNTER — Ambulatory Visit: Payer: BC Managed Care – PPO | Admitting: Internal Medicine

## 2013-08-05 ENCOUNTER — Telehealth: Payer: Self-pay

## 2013-08-05 NOTE — Telephone Encounter (Signed)
lvmom to reschedule apt we canceled due to snow.  Called pt several times, along with emailed.

## 2013-08-30 ENCOUNTER — Other Ambulatory Visit: Payer: Self-pay | Admitting: Internal Medicine

## 2013-08-30 ENCOUNTER — Telehealth: Payer: Self-pay | Admitting: Pulmonary Disease

## 2013-08-30 DIAGNOSIS — E119 Type 2 diabetes mellitus without complications: Secondary | ICD-10-CM

## 2013-08-30 NOTE — Telephone Encounter (Signed)
Pt had a home sleep study done in 05/2013. He was never contacted about the results. Pt was requesting these results be faxed to him. Advised him since these results were never discussed with him he should come in and see KC. ROV has been scheduled for 09/10/13 at 9:45am.

## 2013-08-31 NOTE — Telephone Encounter (Signed)
Refill for Invokana sent to [facility]

## 2013-09-10 ENCOUNTER — Encounter: Payer: Self-pay | Admitting: Pulmonary Disease

## 2013-09-10 ENCOUNTER — Ambulatory Visit (INDEPENDENT_AMBULATORY_CARE_PROVIDER_SITE_OTHER): Payer: BC Managed Care – PPO | Admitting: Pulmonary Disease

## 2013-09-10 VITALS — BP 140/84 | HR 93 | Temp 98.0°F | Ht 72.0 in | Wt 346.0 lb

## 2013-09-10 DIAGNOSIS — G4733 Obstructive sleep apnea (adult) (pediatric): Secondary | ICD-10-CM

## 2013-09-10 NOTE — Assessment & Plan Note (Signed)
The patient has moderate OSA by his recent study, but he is clearly very symptomatic both during sleep and during his waking hours. I have recommended a trial of weight loss while wearing CPAP, and the patient is agreeable to this approach. I will set the patient up on cpap at a moderate pressure level to allow for desensitization, and will troubleshoot the device over the next 4-6weeks if needed.  The pt is to call me if having issues with tolerance.  Will then optimize the pressure once patient is able to wear cpap on a consistent basis.

## 2013-09-10 NOTE — Progress Notes (Signed)
   Subjective:    Patient ID: Spencer Good, male    DOB: 1975/09/05, 38 y.o.   MRN: [id number]  HPI Patient comes in today for followup of his recent sleep study. He was found to have moderate OSA, with an AHI of 33 events per hour. I have reviewed the study with him in detail, and answered all of his questions.   Review of Systems  Constitutional: Negative for fever and unexpected weight change.  HENT: Negative for congestion, dental problem, ear pain, nosebleeds, postnasal drip, rhinorrhea, sinus pressure, sneezing, sore throat and trouble swallowing.   Eyes: Negative for redness and itching.  Respiratory: Negative for cough, chest tightness, shortness of breath and wheezing.   Cardiovascular: Negative for palpitations and leg swelling.  Gastrointestinal: Negative for nausea and vomiting.  Genitourinary: Negative for dysuria.  Musculoskeletal: Negative for joint swelling.  Skin: Negative for rash.  Neurological: Negative for headaches.  Hematological: Does not bruise/bleed easily.  Psychiatric/Behavioral: Negative for dysphoric mood. The patient is not nervous/anxious.        Objective:   Physical Exam Morbidly obese male in no acute distress Nose without purulence or discharge noted No skin breakdown or pressure necrosis from the CPAP Neck without lymphadenopathy or thyromegaly Lower extremities without significant edema, no cyanosis Alert and oriented, moves all 4 extremities       Assessment & Plan:

## 2013-09-10 NOTE — Patient Instructions (Signed)
Will start on cpap at a moderate pressure level.  Please call if tolerance issues. Work on weight loss followup with me in 8 weeks.  

## 2013-09-14 ENCOUNTER — Encounter: Payer: Self-pay | Admitting: Internal Medicine

## 2013-09-14 ENCOUNTER — Ambulatory Visit (INDEPENDENT_AMBULATORY_CARE_PROVIDER_SITE_OTHER): Payer: BC Managed Care – PPO | Admitting: Internal Medicine

## 2013-09-14 VITALS — BP 155/105 | HR 98 | Temp 98.4°F | Wt 344.0 lb

## 2013-09-14 DIAGNOSIS — G4733 Obstructive sleep apnea (adult) (pediatric): Secondary | ICD-10-CM

## 2013-09-14 DIAGNOSIS — F17200 Nicotine dependence, unspecified, uncomplicated: Secondary | ICD-10-CM

## 2013-09-14 DIAGNOSIS — L219 Seborrheic dermatitis, unspecified: Secondary | ICD-10-CM

## 2013-09-14 DIAGNOSIS — E119 Type 2 diabetes mellitus without complications: Secondary | ICD-10-CM

## 2013-09-14 LAB — COMPREHENSIVE METABOLIC PANEL
ALT: 40 U/L (ref 0–53)
AST: 28 U/L (ref 0–37)
Albumin: 4.4 g/dL (ref 3.5–5.2)
Alkaline Phosphatase: 73 U/L (ref 39–117)
BUN: 17 mg/dL (ref 6–23)
CO2: 25 mEq/L (ref 19–32)
Calcium: 9.3 mg/dL (ref 8.4–10.5)
Chloride: 102 mEq/L (ref 96–112)
Creatinine, Ser: 1 mg/dL (ref 0.4–1.5)
GFR: 89.04 mL/min (ref >60.00)
Glucose, Bld: 118 mg/dL — ABNORMAL HIGH (ref 70–99)
Potassium: 4.5 mEq/L (ref 3.5–5.1)
Sodium: 138 mEq/L (ref 135–145)
Total Bilirubin: 0.4 mg/dL (ref 0.3–1.2)
Total Protein: 7.7 g/dL (ref 6.0–8.3)

## 2013-09-14 LAB — HEMOGLOBIN A1C: Hgb A1c MFr Bld: 7.3 % — ABNORMAL HIGH (ref 4.6–6.5)

## 2013-09-14 MED ORDER — VARENICLINE TARTRATE 0.5 MG PO TABS: 0.5000 mg | ORAL_TABLET | Freq: Two times a day (BID) | ORAL | Status: DC

## 2013-09-14 MED ORDER — GLUCOSE BLOOD VI STRP: ORAL_STRIP | Status: DC

## 2013-09-14 MED ORDER — ONETOUCH LANCETS MISC: 1.0000 | Freq: Once | Status: DC

## 2013-09-14 MED ORDER — VARENICLINE TARTRATE 0.5 MG X 11 & 1 MG X 42 PO MISC: ORAL | Status: DC

## 2013-09-14 NOTE — Assessment & Plan Note (Addendum)
Based on the last A1c was prescribed invokana 100 mg, good compliance and tolerance, no apparent side effects. Feet exam normal today except for dry heels Plan: Labs, most likely will need to Increase invokana  Diet exercise! Recommend an eye exam Also rec to check amb BPs and CBGs Recheck cholesterol once diabetes is better

## 2013-09-14 NOTE — Patient Instructions (Signed)
Get your blood work before you leave   Next visit is for a physical exam in 3 months , fasting Please make an appointment    Start checking your blood sugars once a day: Sometimes before meals, sometimes after meals, write readings in a log, bring log when you come back   Check the  blood pressure 2 or 3 times a month be sure it is between 110/60 and 140/85. Ideal blood pressure is 120/80. If it is consistently higher or lower, let me know  See the eye doctor yearly (due to diabetes)

## 2013-09-14 NOTE — Progress Notes (Signed)
Pre visit review using our clinic review tool, if applicable. No additional management support is needed unless otherwise documented below in the visit note. 

## 2013-09-14 NOTE — Assessment & Plan Note (Signed)
Already saw pulmonary, was prescribed a CPAP which is pending

## 2013-09-14 NOTE — Assessment & Plan Note (Signed)
Patient had a relapse, ready to try Chantix again. Prescription provided, encourage and praised  for his efforts

## 2013-09-14 NOTE — Progress Notes (Signed)
Gave patient new meter and reviewed instructions on usage. Sent in new supplies to [facility] on [address]

## 2013-09-14 NOTE — Assessment & Plan Note (Signed)
Saw dermatology, prescribed shampoo, doing better

## 2013-09-14 NOTE — Progress Notes (Signed)
Subjective:    Patient ID: Spencer Good, male    DOB: July 18, 1975, 38 y.o.   MRN: [id number]  DOS:  09/14/2013 Type of  visit:  Followup from previous visit  Diabetes--started invokana, not ambulatory CBGs, has lost 2 pounds, has improved his diet to some extent Eczema, saw dermatology, prescribed a shampoo, doing better. Tobacco, went back to smoking, he was very stressed at work but is ready to quit again, needs a prescription for Chantix.  sleep apnea, diagnosed in 2014, already saw pulmonary, CPAP pending. BP today slightly elevated, ambulatory BPs usually well controlled   ROS Diet-- improved, healthier Exercise-- no improvment DM--- (-) LE paresthesias , (-) visual disturbances   No  CP, SOB Denies  nausea, vomiting diarrhea Denies  blood in the stools No genital rash. No dysuria, gross hematuria, difficulty urinating     Past Medical History  Diagnosis Date  . Gout   . Tobacco abuse   . ADHD (attention deficit hyperactivity disorder)     per Dr Candis Schatz  . Diabetes   . OSA (obstructive sleep apnea) dx 05-2013    Rx a Cpap 3-15    Past Surgical History  Procedure Laterality Date  . No past surgeries      History   Social History  . Marital Status: Single    Spouse Name: N/A    Number of Children: 0  . 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 Smoking status: Former Smoker -- 1.00 packs/day for 20 years    Types: Cigarettes    Quit date: 12/24/2012  . Smokeless tobacco: Never Used     Comment: quits on-off  . Alcohol Use: Yes     Comment: rarely 1-2 monthly  . Drug Use: No  . Sexual Activity: Not on file   Other Topics Concern  . Not on file   Social History Narrative  . No narrative on file        Medication List       This list is accurate as of: 09/14/13  5:26 PM.  Always use your most recent med list.               azelastine 137 MCG/SPRAY nasal spray   Commonly known as:  ASTELIN  Place 2 sprays into the nose 2 (two) times daily as needed. Use in each nostril as directed     b complex vitamins tablet  Take 1 tablet by mouth daily.     glucose blood test strip  Use as instructed     INVOKANA 100 MG Tabs  Generic drug:  Canagliflozin  TAKE 1 TABLET BY MOUTH EVERY DAY     lisdexamfetamine 40 MG capsule  Commonly known as:  VYVANSE  Take 50 mg by mouth every morning.     metFORMIN 850 MG tablet  Commonly known as:  GLUCOPHAGE  TAKE 1 TABLET BY MOUTH TWICE DAILY WITH A MEAL     multivitamin tablet  Take 1 tablet by mouth daily.     ONE TOUCH LANCETS Misc  1 each by Does not apply route once.     varenicline 0.5 MG X 11 & 1 MG X 42 tablet  Commonly known as:  CHANTIX STARTING MONTH PAK  Take one 0.5 mg tablet by mouth once daily for 3 days, then increase to one 0.5 mg tablet twice daily for 4 days, then increase  to one 1 mg tablet twice daily.     varenicline 0.5 MG tablet  Commonly known as:  CHANTIX  Take 1 tablet (0.5 mg total) by mouth 2 (two) times daily.     zaleplon 10 MG capsule  Commonly known as:  SONATA  Take 10 mg by mouth as needed.           Objective:   Physical Exam BP 155/105  Pulse 98  Temp(Src) 98.4 F (36.9 C)  Wt 344 lb (156.037 kg)  SpO2 99%  General -- alert, well-developed, NAD.   Lungs -- normal respiratory effort, no intercostal retractions, no accessory muscle use, and normal breath sounds.  Heart-- normal rate, regular rhythm, no murmur.  DIABETIC FEET EXAM: No lower extremity edema Normal pedal pulses bilaterally Skin : dry, callous heel; nails normal  Pinprick examination of the feet normal. Neurologic--  alert & oriented X3. Speech normal, gait normal, strength normal in all extremities.  Psych-- Cognition and judgment appear intact. Cooperative with normal attention span and concentration. No anxious or depressed appearing.       Assessment & Plan:

## 2013-09-16 ENCOUNTER — Telehealth: Payer: Self-pay | Admitting: Pulmonary Disease

## 2013-09-16 DIAGNOSIS — G4733 Obstructive sleep apnea (adult) (pediatric): Secondary | ICD-10-CM

## 2013-09-16 MED ORDER — CANAGLIFLOZIN 300 MG PO TABS: 300.0000 mg | ORAL_TABLET | Freq: Every day | ORAL | Status: DC

## 2013-09-16 NOTE — Addendum Note (Signed)
Addended by: Peggyann Shoals on: 09/16/2013 09:49 AM   Modules accepted: Orders

## 2013-09-16 NOTE — Telephone Encounter (Signed)
Spoke with pt. He states that he is wanting to change DME companies. Order was sent to [facility] for CPAP set up. Pt states that he is having a hard time with customer service. They are demanding to have a credit care on file for the pt. Pt refuses to do this so [facility] will not supply him with CPAP machine. He does have insurance and a HRA that should cover whatever insurance doesn't.    [place] - please advise on DME change.

## 2013-09-17 NOTE — Telephone Encounter (Signed)
Ok with me as long as it is not advanced.

## 2013-09-20 NOTE — Telephone Encounter (Signed)
Order was sent to [facility] for CPAP through any DME but [facility], [facility], or [facility]  LMTCB for the pt

## 2013-09-20 NOTE — Telephone Encounter (Signed)
Spoke with the pt and advised that we have sent order to [facility] for new DME  He verbalized understanding and nothing further needed

## 2013-09-20 NOTE — Telephone Encounter (Signed)
Pt has called back. °

## 2013-09-28 ENCOUNTER — Telehealth: Payer: Self-pay

## 2013-09-28 NOTE — Telephone Encounter (Signed)
Relevant patient education assigned to patient using Emmi. ° °

## 2013-11-11 ENCOUNTER — Encounter: Payer: Self-pay | Admitting: Pulmonary Disease

## 2013-11-11 ENCOUNTER — Ambulatory Visit (INDEPENDENT_AMBULATORY_CARE_PROVIDER_SITE_OTHER): Payer: BC Managed Care – PPO | Admitting: Pulmonary Disease

## 2013-11-11 VITALS — BP 160/98 | HR 95 | Temp 98.6°F | Ht 72.0 in | Wt 348.6 lb

## 2013-11-11 DIAGNOSIS — G4733 Obstructive sleep apnea (adult) (pediatric): Secondary | ICD-10-CM

## 2013-11-11 NOTE — Progress Notes (Signed)
   Subjective:    Patient ID: Spencer Good, male    DOB: 10-Sep-1975, 38 y.o.   MRN: [id number]  HPI The patient comes in today for followup of his obstructive sleep apnea. He was started on CPAP at the last visit, and has had significant issues with mask fit and possibly pressure. He is pulling the mask off during the middle of the night for unknown reasons, and his download shows that he is only getting about one to 2 hours of usage at night. He is trying to wear the device almost every night. The download also show significant mask leak. The patient feels that we have not found a mass that totally fits comfortably on his face.   Review of Systems  Constitutional: Negative for fever and unexpected weight change.  HENT: Negative for congestion, dental problem, ear pain, nosebleeds, postnasal drip, rhinorrhea, sinus pressure, sneezing, sore throat and trouble swallowing.   Eyes: Negative for redness and itching.  Respiratory: Negative for cough, chest tightness, shortness of breath and wheezing.   Cardiovascular: Negative for palpitations and leg swelling.  Gastrointestinal: Negative for nausea and vomiting.  Genitourinary: Negative for dysuria.  Musculoskeletal: Negative for joint swelling.  Skin: Negative for rash.  Neurological: Negative for headaches.  Hematological: Does not bruise/bleed easily.  Psychiatric/Behavioral: Negative for dysphoric mood. The patient is not nervous/anxious.        Objective:   Physical Exam Obese male in no acute distress Nose without purulence or discharge noted No skin breakdown or pressure necrosis from the CPAP mask Neck without lymphadenopathy or thyromegaly Lower extremities with minimal edema, no cyanosis Alert and oriented, moves all 4 extremities.       Assessment & Plan:

## 2013-11-11 NOTE — Assessment & Plan Note (Signed)
The patient is having significant issues with CPAP tolerance, and I suspect is a combination of mask fit with leaking and possibly his pressure delivery. I would like to try him on the automatic setting, and will also refer him to the sleep Center for a mask fitting. It may be that he simply needs more time to get used to the device. I've also encouraged him to work aggressively on weight loss.

## 2013-11-11 NOTE — Patient Instructions (Signed)
Will put your cpap machine on the auto setting to see if this is better tolerated. Will arrange for fitting session at the sleep center.  Please take your various masks with you. Work on weight loss Please call me after you have been on new pressure with better fitting mask for at least 3 weeks. followup with me again in 40mos.

## 2013-11-16 ENCOUNTER — Ambulatory Visit (HOSPITAL_BASED_OUTPATIENT_CLINIC_OR_DEPARTMENT_OTHER): Payer: BC Managed Care – PPO | Attending: Pulmonary Disease | Admitting: Radiology

## 2013-11-16 DIAGNOSIS — G4733 Obstructive sleep apnea (adult) (pediatric): Secondary | ICD-10-CM

## 2013-11-16 DIAGNOSIS — Z9989 Dependence on other enabling machines and devices: Secondary | ICD-10-CM

## 2013-11-22 ENCOUNTER — Telehealth: Payer: Self-pay | Admitting: Pulmonary Disease

## 2013-11-22 DIAGNOSIS — G4733 Obstructive sleep apnea (adult) (pediatric): Secondary | ICD-10-CM

## 2013-11-22 NOTE — Telephone Encounter (Signed)
It is supposed to go between 5-15cm.  Can change it to 5-20cm,.  He may be looking at the heater setting on his humidifier, which is a fixed number. Ok to send order to dme for 5-20cm  Thanks.

## 2013-11-22 NOTE — Telephone Encounter (Signed)
Per OV 11/11/13;  Patient Instructions      Will put your cpap machine on the auto setting to see if this is better tolerated. Will arrange for fitting session at the sleep center.  Please take your various masks with you. Work on weight loss Please call me after you have been on new pressure with better fitting mask for at least 3 weeks. followup with me again in 29mos  --  Called spoke with pt. He reports his machine is on auto setting 5-15CM. He noticed Friday he felt like he is not getting enough air. When he was taking a deep breathe he just felt the air was not enough. He is wanting pressure adjusted. He has been monitoring his machine and notices the pressure does not get above 4 cm. Please advise [place] thanks

## 2013-11-22 NOTE — Telephone Encounter (Signed)
Pt aware of recs. Order sent

## 2013-12-22 ENCOUNTER — Telehealth: Payer: Self-pay | Admitting: *Deleted

## 2013-12-22 NOTE — Telephone Encounter (Addendum)
Medication List and allergies: Reviewed and updated  90 Day supply/mail order: N/A Local prescriptions: [facility], [place]  Immunizations Due: PNA, Shingles  A/P FH, PSH, or Personal Hx: Reviewed and updated Flu vaccine: 03/2013 Tdap:Td 06/2010 ZOX:[id number] Shingles:Never   To Discuss with Provider: Hernia discomfort, wants to come off metformin

## 2013-12-23 ENCOUNTER — Encounter: Payer: Self-pay | Admitting: Internal Medicine

## 2013-12-23 ENCOUNTER — Ambulatory Visit (INDEPENDENT_AMBULATORY_CARE_PROVIDER_SITE_OTHER): Payer: BC Managed Care – PPO | Admitting: Internal Medicine

## 2013-12-23 VITALS — BP 153/91 | HR 80 | Temp 98.0°F | Ht 72.3 in | Wt 343.0 lb

## 2013-12-23 DIAGNOSIS — E119 Type 2 diabetes mellitus without complications: Secondary | ICD-10-CM

## 2013-12-23 DIAGNOSIS — G4733 Obstructive sleep apnea (adult) (pediatric): Secondary | ICD-10-CM

## 2013-12-23 DIAGNOSIS — Z0000 Encounter for general adult medical examination without abnormal findings: Secondary | ICD-10-CM

## 2013-12-23 LAB — CBC WITH DIFFERENTIAL/PLATELET
Basophils Absolute: 0.1 10*3/uL (ref 0.0–0.1)
Basophils Relative: 0.5 % (ref 0.0–3.0)
Eosinophils Absolute: 0.3 10*3/uL (ref 0.0–0.7)
Eosinophils Relative: 3 % (ref 0.0–5.0)
HCT: 44.7 % (ref 39.0–52.0)
Hemoglobin: 14.9 g/dL (ref 13.0–17.0)
Lymphocytes Relative: 37.3 % (ref 12.0–46.0)
Lymphs Abs: 4 10*3/uL (ref 0.7–4.0)
MCHC: 33.4 g/dL (ref 30.0–36.0)
MCV: 83.2 fl (ref 78.0–100.0)
Monocytes Absolute: 0.8 10*3/uL (ref 0.1–1.0)
Monocytes Relative: 7.2 % (ref 3.0–12.0)
Neutro Abs: 5.6 10*3/uL (ref 1.4–7.7)
Neutrophils Relative %: 52 % (ref 43.0–77.0)
Platelets: 239 10*3/uL (ref 150.0–400.0)
RBC: 5.37 Mil/uL (ref 4.22–5.81)
RDW: 15.4 % (ref 11.5–15.5)
WBC: 10.7 10*3/uL — ABNORMAL HIGH (ref 4.0–10.5)

## 2013-12-23 LAB — LIPID PANEL
Cholesterol: 205 mg/dL — ABNORMAL HIGH (ref 0–200)
HDL: 24 mg/dL — ABNORMAL LOW (ref >39.00)
NonHDL: 181
Total CHOL/HDL Ratio: 9
Triglycerides: 455 mg/dL — ABNORMAL HIGH (ref 0.0–149.0)
VLDL: 91 mg/dL — ABNORMAL HIGH (ref 0.0–40.0)

## 2013-12-23 LAB — MICROALBUMIN / CREATININE URINE RATIO
Creatinine,U: 128.6 mg/dL
Microalb Creat Ratio: 0.2 mg/g (ref 0.0–30.0)
Microalb, Ur: 0.3 mg/dL (ref 0.0–1.9)

## 2013-12-23 LAB — BASIC METABOLIC PANEL
BUN: 19 mg/dL (ref 6–23)
CO2: 26 mEq/L (ref 19–32)
Calcium: 9.3 mg/dL (ref 8.4–10.5)
Chloride: 102 mEq/L (ref 96–112)
Creatinine, Ser: 1.1 mg/dL (ref 0.4–1.5)
GFR: 83.17 mL/min (ref >60.00)
Glucose, Bld: 112 mg/dL — ABNORMAL HIGH (ref 70–99)
Potassium: 4.1 mEq/L (ref 3.5–5.1)
Sodium: 136 mEq/L (ref 135–145)

## 2013-12-23 LAB — HEMOGLOBIN A1C: Hgb A1c MFr Bld: 6.6 % — ABNORMAL HIGH (ref 4.6–6.5)

## 2013-12-23 NOTE — Progress Notes (Signed)
Subjective:    Patient ID: Spencer Good, male    DOB: 06-29-1975, 38 y.o.   MRN: [id number]  DOS:  12/23/2013 Type of visit - description: CPX   ROS No  CP, SOB No palpitations, no lower extremity edema  Denies  nausea, vomiting diarrhea, blood in the stools (-) cough, sputum production (-) wheezing, chest congestion No dysuria, gross hematuria, difficulty urinating  n o anxiety, depression    Past Medical History  Diagnosis Date  . Gout   . Tobacco abuse   . ADHD (attention deficit hyperactivity disorder)     per Dr Candis Schatz  . Diabetes   . OSA (obstructive sleep apnea) dx 05-2013    Rx a Cpap 3-15    Past Surgical History  Procedure Laterality Date  . No past surgeries      History   Social History  . Marital Status: Single    Spouse Name: N/A    Number of Children: 0  . 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 Smoking status: Former Smoker -- 1.00 packs/day for 20 years    Types: Cigarettes    Quit date: 12/24/2012  . Smokeless tobacco: Never Used     Comment: quits on-off   . Alcohol Use: Yes     Comment: rarely 1-2 monthly  . Drug Use: No  . Sexual Activity: Not on file   Other Topics Concern  . Not on file   Social History Narrative  . No narrative on file     Family History  Problem Relation Age of Onset  . Colon cancer Neg Hx   . Prostate cancer Neg Hx   . CAD Other     MGF  . Stroke Father     F, PGF  . Diabetes Neg Hx     distant fam members  . Heart disease Maternal Grandfather       Medication List       This list is accurate as of: 12/23/13  4:11 PM.  Always use your most recent med list.               b complex vitamins tablet  Take 1 tablet by mouth daily.     Canagliflozin 300 MG Tabs  Take 1 tablet (300 mg total) by mouth daily.     glucose blood test strip  Use as instructed     lisdexamfetamine 40 MG capsule  Commonly known as:   VYVANSE  Take 50 mg by mouth every morning.     metFORMIN 850 MG tablet  Commonly known as:  GLUCOPHAGE  TAKE 1 TABLET BY MOUTH TWICE DAILY WITH A MEAL     multivitamin tablet  Take 1 tablet by mouth daily.     ONE TOUCH LANCETS Misc  1 each by Does not apply route once.     varenicline 0.5 MG tablet  Commonly known as:  CHANTIX  Take 1 tablet (0.5 mg total) by mouth 2 (two) times daily.     zaleplon 10 MG capsule  Commonly known as:  SONATA  Take 10 mg by mouth as needed.           Objective:   Physical Exam BP 153/91  Pulse 80  Temp(Src) 98 F (36.7 C)  Ht 6' 0.3" (1.836 m)  Wt 343 lb (155.584 kg)  BMI 46.16 kg/m2  SpO2 95% General -- alert,  well-developed, NAD.  Neck --no thyromegaly  HEENT-- Not pale.  Lungs -- normal respiratory effort, no intercostal retractions, no accessory muscle use, and normal breath sounds.  Heart-- normal rate, regular rhythm, no murmur.  Abdomen-- Not distended, good bowel sounds,soft, non-tender. Extremities-- trace  pretibial edema bilaterally  Neurologic--  alert & oriented X3. Speech normal, gait appropriate for age, strength symmetric and appropriate for age.  Psych-- Cognition and judgment appear intact. Cooperative with normal attention span and concentration. No anxious or depressed appearing.        Assessment & Plan:

## 2013-12-23 NOTE — Assessment & Plan Note (Signed)
So far unable to use a cpap consistently

## 2013-12-23 NOTE — Progress Notes (Signed)
Pre visit review using our clinic review tool, if applicable. No additional management support is needed unless otherwise documented below in the visit note. 

## 2013-12-23 NOTE — Assessment & Plan Note (Signed)
CBG range 90-160, usually 120s

## 2013-12-23 NOTE — Patient Instructions (Signed)
Get your blood work before you leave     Next visit is for routine check up regards your blood sugar  in 4 months  No need to come back fasting Please make an appointment

## 2013-12-23 NOTE — Assessment & Plan Note (Addendum)
Td 2012  Still on Chantix, rarely smokes. Diet, Exercise--  Extensive discussion , a # of suggestions provided  BP slt elevated, at home consistently ~ 120/80  Labs RTC 4 months

## 2013-12-24 ENCOUNTER — Other Ambulatory Visit: Payer: Self-pay | Admitting: Internal Medicine

## 2013-12-24 DIAGNOSIS — D72829 Elevated white blood cell count, unspecified: Secondary | ICD-10-CM

## 2014-01-10 ENCOUNTER — Other Ambulatory Visit: Payer: Self-pay | Admitting: Internal Medicine

## 2014-01-11 ENCOUNTER — Telehealth: Payer: Self-pay | Admitting: *Deleted

## 2014-01-11 DIAGNOSIS — G4733 Obstructive sleep apnea (adult) (pediatric): Secondary | ICD-10-CM

## 2014-01-11 NOTE — Telephone Encounter (Signed)
Per pt email: Message ----- From: [name] Sent: 01/11/2014 10:31 AM EDT To: Kathee Delton, MD Subject: RE: Questionnaire I cannot seem to reach someone at your office or leave a voicemail, so I am sending this to let you know I will be turning in the cpap machine on July 31. Feel free to contact me if you have questions.

## 2014-01-11 NOTE — Telephone Encounter (Signed)
Pt reports he is turning in his cpap. Reports he takes it off in the middle of night w/o realizing this and then feels aweful the next day. Also reports he has tried 6 different masks and still can't find it comfortable. Pt reports he has now stopped wearing the CPAP. Pt aware [place] out of the office this week,. Please advise thanks

## 2014-01-12 NOTE — Telephone Encounter (Signed)
lmomtcb x1 for pt 

## 2014-01-12 NOTE — Telephone Encounter (Signed)
See if he would consider a dental appliance made by a dentist to help with sleep apnea.  Could also consider wearing just oxygen at night while sleeping.  Let me know what he decides.

## 2014-01-12 NOTE — Telephone Encounter (Signed)
Pt returned Mindy's call & can be reached at [phone].  Satira Anis

## 2014-01-12 NOTE — Telephone Encounter (Signed)
I called spoke with pt. He would like dental appliance. Please advise [place] thanks

## 2014-01-17 NOTE — Telephone Encounter (Signed)
Ok to refer to dental medicine ([facility]) for possible dental appliance.

## 2014-01-17 NOTE — Telephone Encounter (Signed)
Referral placed.  Pt aware of recs.  Nothing further needed.

## 2014-01-26 ENCOUNTER — Telehealth: Payer: Self-pay | Admitting: Hematology & Oncology

## 2014-01-26 NOTE — Telephone Encounter (Signed)
Spoke w NEW PATIENT today to remind them of their appointment with Dr. Ennever. Also, advised them to bring all medication bottles and insurance card information. ° °

## 2014-01-27 ENCOUNTER — Telehealth: Payer: Self-pay | Admitting: Hematology & Oncology

## 2014-01-27 ENCOUNTER — Other Ambulatory Visit (HOSPITAL_BASED_OUTPATIENT_CLINIC_OR_DEPARTMENT_OTHER): Payer: BC Managed Care – PPO | Admitting: Lab

## 2014-01-27 ENCOUNTER — Ambulatory Visit: Payer: BC Managed Care – PPO

## 2014-01-27 ENCOUNTER — Encounter: Payer: Self-pay | Admitting: Family

## 2014-01-27 ENCOUNTER — Ambulatory Visit (HOSPITAL_BASED_OUTPATIENT_CLINIC_OR_DEPARTMENT_OTHER): Payer: BC Managed Care – PPO | Admitting: Family

## 2014-01-27 VITALS — BP 156/103 | HR 89 | Temp 97.8°F | Resp 20 | Ht 71.0 in | Wt 346.0 lb

## 2014-01-27 DIAGNOSIS — D72829 Elevated white blood cell count, unspecified: Secondary | ICD-10-CM

## 2014-01-27 DIAGNOSIS — E119 Type 2 diabetes mellitus without complications: Secondary | ICD-10-CM

## 2014-01-27 DIAGNOSIS — D649 Anemia, unspecified: Secondary | ICD-10-CM

## 2014-01-27 LAB — CBC WITH DIFFERENTIAL (CANCER CENTER ONLY)
BASO#: 0 10*3/uL (ref 0.0–0.2)
BASO%: 0.4 % (ref 0.0–2.0)
EOS%: 2.1 % (ref 0.0–7.0)
Eosinophils Absolute: 0.2 10*3/uL (ref 0.0–0.5)
HCT: 45.3 % (ref 38.7–49.9)
HGB: 15.5 g/dL (ref 13.0–17.1)
LYMPH#: 4.3 10*3/uL — ABNORMAL HIGH (ref 0.9–3.3)
LYMPH%: 41.4 % (ref 14.0–48.0)
MCH: 27.8 pg — ABNORMAL LOW (ref 28.0–33.4)
MCHC: 34.2 g/dL (ref 32.0–35.9)
MCV: 81 fL — ABNORMAL LOW (ref 82–98)
MONO#: 0.7 10*3/uL (ref 0.1–0.9)
MONO%: 6.5 % (ref 0.0–13.0)
NEUT#: 5.2 10*3/uL (ref 1.5–6.5)
NEUT%: 49.6 % (ref 40.0–80.0)
Platelets: 217 10*3/uL (ref 145–400)
RBC: 5.57 10*6/uL (ref 4.20–5.70)
RDW: 14.7 % (ref 11.1–15.7)
WBC: 10.4 10*3/uL — ABNORMAL HIGH (ref 4.0–10.0)

## 2014-01-27 LAB — CHCC SATELLITE - SMEAR

## 2014-01-27 NOTE — Progress Notes (Signed)
Hematology/Oncology Consultation   Name: Spencer Good      MRN: [id number]    Location: Room/bed info not found  Date: 01/27/2014 Time:1:48 PM   REFERRING PHYSICIAN:  Jose E Paz  REASON FOR CONSULT:  Leukocytosis   DIAGNOSIS: Leukocytosis  HISTORY OF PRESENT ILLNESS:  [name] is a very pleasant 38 yo male with leukocytosis. His WBC today is 10.4. He has some issues with sleeping. He has obstructive sleep apnea. He has tried wearing a CPAP but takes it off in his sleep. He stopped smoking 2 months ago but isn't sure for how long. He denies fever, chills, n/v, cough, rash, headaches, dizziness, SOB, chest pain, palpitations, abdominal pain, constipation, diarrhea, blood in urine or stool. He has had no bleeding or pain. He denies swelling, tenderness, numbness or tingling in his extremities. He denies any recent infections. He was recently diagnosed with type II diabetes and is on Metformin and Vyvanse. He has no history of blood disorder. His Father was a smoker and had lung cancer and had a lobectomy and later died of a stroke. He is trying to lose weight.   ROS: All other 10 point review of systems is negative.   PAST MEDICAL HISTORY:   Past Medical History  Diagnosis Date  . Gout   . Tobacco abuse   . ADHD (attention deficit hyperactivity disorder)     per Dr Candis Schatz  . Diabetes   . OSA (obstructive sleep apnea) dx 05-2013    Rx a Cpap 3-15   ALLERGIES: No Known Allergies    MEDICATIONS:  Current Outpatient Prescriptions on File Prior to Visit  Medication Sig Dispense Refill  . b complex vitamins tablet Take 1 tablet by mouth daily.      . Canagliflozin 300 MG TABS Take 1 tablet (300 mg total) by mouth daily.  30 tablet  5  . glucose blood test strip Use as instructed  100 each  3  . metFORMIN (GLUCOPHAGE) 850 MG tablet TAKE 1 TABLET BY MOUTH TWICE DAILY WITH A MEAL  60 tablet  0  . Multiple Vitamin (MULTIVITAMIN) tablet Take 1 tablet by mouth daily.      . ONE TOUCH  LANCETS MISC 1 each by Does not apply route once.  1 each  3  . zaleplon (SONATA) 10 MG capsule Take 10 mg by mouth as needed.       No current facility-administered medications on file prior to visit.    PAST SURGICAL HISTORY Past Surgical History  Procedure Laterality Date  . No past surgeries     FAMILY HISTORY: Family History  Problem Relation Age of Onset  . Colon cancer Neg Hx   . Prostate cancer Neg Hx   . CAD Other     MGF  . Stroke Father     F, PGF  . Diabetes Neg Hx     distant fam members  . Heart disease Maternal Grandfather    SOCIAL HISTORY:  reports that he quit smoking about 13 months ago. His smoking use included Cigarettes. He has a 20 pack-year smoking history. He has never used smokeless tobacco. He reports that he drinks alcohol. He reports that he does not use illicit drugs.  PERFORMANCE STATUS: The patient's performance status is 0 - Asymptomatic  PHYSICAL EXAM: Most Recent Vital Signs: Blood pressure 156/103, pulse 89, temperature 97.8 F (36.6 C), temperature source Oral, resp. rate 20, height 5\' 11"  (1.803 m), weight 346 lb (156.945 kg). BP 156/103  Pulse 89  Temp(Src) 97.8 F (36.6 C) (Oral)  Resp 20  Ht 5\' 11"  (1.803 m)  Wt 346 lb (156.945 kg)  BMI 48.28 kg/m2  General Appearance:    Alert, cooperative, no distress, appears stated age  Head:    Normocephalic, without obvious abnormality, atraumatic  Eyes:    PERRL, conjunctiva/corneas clear, EOM's intact, fundi    benign, both eyes             Throat:   Lips, mucosa, and tongue normal; teeth and gums normal  Neck:   Supple, symmetrical, trachea midline, no adenopathy;       thyroid:  No enlargement/tenderness/nodules; no carotid   bruit or JVD  Back:     Symmetric, no curvature, ROM normal, no CVA tenderness  Lungs:     Clear to auscultation bilaterally, respirations unlabored  Chest wall:    No tenderness or deformity  Heart:    Regular rate and rhythm, S1 and S2 normal, no murmur,  rub   or gallop  Abdomen:     Soft, non-tender, bowel sounds active all four quadrants,    no masses, no organomegaly        Extremities:   Extremities normal, atraumatic, no cyanosis or edema  Pulses:   2+ and symmetric all extremities  Skin:   Skin color, texture, turgor normal, no rashes or lesions  Lymph nodes:   Cervical, supraclavicular, and axillary nodes normal  Neurologic:   CNII-XII intact. Normal strength, sensation and reflexes      throughout   LABORATORY DATA:  Results for orders placed in visit on 01/27/14 (from the past 48 hour(s))  CBC WITH DIFFERENTIAL ([place])     Status: Abnormal   Collection Time    01/27/14  1:12 PM      Result Value Ref Range   WBC 10.4 (*) 4.0 - 10.0 10e3/uL   RBC 5.57  4.20 - 5.70 10e6/uL   HGB 15.5  13.0 - 17.1 g/dL   HCT 45.3  38.7 - 49.9 %   MCV 81 (*) 82 - 98 fL   MCH 27.8 (*) 28.0 - 33.4 pg   MCHC 34.2  32.0 - 35.9 g/dL   RDW 14.7  11.1 - 15.7 %   Platelets 217  145 - 400 10e3/uL   NEUT# 5.2  1.5 - 6.5 10e3/uL   LYMPH# 4.3 (*) 0.9 - 3.3 10e3/uL   MONO# 0.7  0.1 - 0.9 10e3/uL   Eosinophils Absolute 0.2  0.0 - 0.5 10e3/uL   BASO# 0.0  0.0 - 0.2 10e3/uL   NEUT% 49.6  40.0 - 80.0 %   LYMPH% 41.4  14.0 - 48.0 %   MONO% 6.5  0.0 - 13.0 %   EOS% 2.1  0.0 - 7.0 %   BASO% 0.4  0.0 - 2.0 %  [facility] - SMEAR     Status: None   Collection Time    01/27/14  1:12 PM      Result Value Ref Range   Smear Result Smear Available       RADIOGRAPHY: No results found.     PATHOLOGY:  None   ASSESSMENT/PLAN:  [name] is a very pleasant 38 yo male with leukocytosis. His WBC today is 10.4. He has no recent infections. He is asymptomatic.  His blood smear was unremarkable.  We will see him back in 6 months for labs and follow-up.  All questions were answered. The patient knows to call the clinic with  any problems, questions or concerns. We can certainly see the patient much sooner if necessary.  The patient discussed with and  also seen by Dr. Marin Olp and he is in agreement with the aforementioned.   [place] by Dr. Marin Olp:    ADDENDUM:  I saw and examined the patient with Sarah. We looked at his blood under the microscope. His white cells were mature. He had a few large lymphocytes. I did not see any immature myeloid lymphoid cells. He had no hypersegmented polys. He had some microcytic red cells. I saw no nucleated red cells. He had normal platelets.  His physical exam was primus unremarkable.  He's had this mild leukocytosis for 3 years. It is possible that it might be related to her medications. I saw nothing on his blood smear that looked suspicious for a bone marrow disorder.  I do not see an indication for any bone marrow test that needs to be done. I don't see any indication for any x-ray studies.  I would like to see her back in about 6 months. If all looks stable at that point on, benefit we can let him go from the clinic.  We spent about 45 minutes with him. We answered all his questions.

## 2014-01-27 NOTE — Patient Instructions (Signed)
Smoking Cessation Quitting smoking is important to your health and has many advantages. However, it is not always easy to quit since nicotine is a very addictive drug. Oftentimes, people try 3 times or more before being able to quit. This document explains the best ways for you to prepare to quit smoking. Quitting takes hard work and a lot of effort, but you can do it. ADVANTAGES OF QUITTING SMOKING  You will live longer, feel better, and live better.  Your body will feel the impact of quitting smoking almost immediately.  Within 20 minutes, blood pressure decreases. Your pulse returns to its normal level.  After 8 hours, carbon monoxide levels in the blood return to normal. Your oxygen level increases.  After 24 hours, the chance of having a heart attack starts to decrease. Your breath, hair, and body stop smelling like smoke.  After 48 hours, damaged nerve endings begin to recover. Your sense of taste and smell improve.  After 72 hours, the body is virtually free of nicotine. Your bronchial tubes relax and breathing becomes easier.  After 2 to 12 weeks, lungs can hold more air. Exercise becomes easier and circulation improves.  The risk of having a heart attack, stroke, cancer, or lung disease is greatly reduced.  After 1 year, the risk of coronary heart disease is cut in half.  After 5 years, the risk of stroke falls to the same as a nonsmoker.  After 10 years, the risk of lung cancer is cut in half and the risk of other cancers decreases significantly.  After 15 years, the risk of coronary heart disease drops, usually to the level of a nonsmoker.  If you are pregnant, quitting smoking will improve your chances of having a healthy baby.  The people you live with, especially any children, will be healthier.  You will have extra money to spend on things other than cigarettes. QUESTIONS TO THINK ABOUT BEFORE ATTEMPTING TO QUIT You may want to talk about your answers with your  health care provider.  Why do you want to quit?  If you tried to quit in the past, what helped and what did not?  What will be the most difficult situations for you after you quit? How will you plan to handle them?  Who can help you through the tough times? Your family? Friends? A health care provider?  What pleasures do you get from smoking? What ways can you still get pleasure if you quit? Here are some questions to ask your health care provider:  How can you help me to be successful at quitting?  What medicine do you think would be best for me and how should I take it?  What should I do if I need more help?  What is smoking withdrawal like? How can I get information on withdrawal? GET READY  Set a quit date.  Change your environment by getting rid of all cigarettes, ashtrays, matches, and lighters in your home, car, or work. Do not let people smoke in your home.  Review your past attempts to quit. Think about what worked and what did not. GET SUPPORT AND ENCOURAGEMENT You have a better chance of being successful if you have help. You can get support in many ways.  Tell your family, friends, and coworkers that you are going to quit and need their support. Ask them not to smoke around you.  Get individual, group, or telephone counseling and support. Programs are available at local hospitals and health centers. Call   your local health department for information about programs in your area.  Spiritual beliefs and practices may help some smokers quit.  Download a "quit meter" on your computer to keep track of quit statistics, such as how long you have gone without smoking, cigarettes not smoked, and money saved.  Get a self-help book about quitting smoking and staying off tobacco. LEARN NEW SKILLS AND BEHAVIORS  Distract yourself from urges to smoke. Talk to someone, go for a walk, or occupy your time with a task.  Change your normal routine. Take a different route to work.  Drink tea instead of coffee. Eat breakfast in a different place.  Reduce your stress. Take a hot bath, exercise, or read a book.  Plan something enjoyable to do every day. Reward yourself for not smoking.  Explore interactive web-based programs that specialize in helping you quit. GET MEDICINE AND USE IT CORRECTLY Medicines can help you stop smoking and decrease the urge to smoke. Combining medicine with the above behavioral methods and support can greatly increase your chances of successfully quitting smoking.  Nicotine replacement therapy helps deliver nicotine to your body without the negative effects and risks of smoking. Nicotine replacement therapy includes nicotine gum, lozenges, inhalers, nasal sprays, and skin patches. Some may be available over-the-counter and others require a prescription.  Antidepressant medicine helps people abstain from smoking, but how this works is unknown. This medicine is available by prescription.  Nicotinic receptor partial agonist medicine simulates the effect of nicotine in your brain. This medicine is available by prescription. Ask your health care provider for advice about which medicines to use and how to use them based on your health history. Your health care provider will tell you what side effects to look out for if you choose to be on a medicine or therapy. Carefully read the information on the package. Do not use any other product containing nicotine while using a nicotine replacement product.  RELAPSE OR DIFFICULT SITUATIONS Most relapses occur within the first 3 months after quitting. Do not be discouraged if you start smoking again. Remember, most people try several times before finally quitting. You may have symptoms of withdrawal because your body is used to nicotine. You may crave cigarettes, be irritable, feel very hungry, cough often, get headaches, or have difficulty concentrating. The withdrawal symptoms are only temporary. They are strongest  when you first quit, but they will go away within 10-14 days. To reduce the chances of relapse, try to:  Avoid drinking alcohol. Drinking lowers your chances of successfully quitting.  Reduce the amount of caffeine you consume. Once you quit smoking, the amount of caffeine in your body increases and can give you symptoms, such as a rapid heartbeat, sweating, and anxiety.  Avoid smokers because they can make you want to smoke.  Do not let weight gain distract you. Many smokers will gain weight when they quit, usually less than 10 pounds. Eat a healthy diet and stay active. You can always lose the weight gained after you quit.  Find ways to improve your mood other than smoking. FOR MORE INFORMATION  www.smokefree.gov  Document Released: 05/28/2001 Document Revised: 10/18/2013 Document Reviewed: 09/12/2011 Exit[employer] Patient Information 2015 [employer]. This information is not intended to replace advice given to you by your health care provider. Make sure you discuss any questions you have with your health care provider.  

## 2014-01-27 NOTE — Telephone Encounter (Signed)
Mailed February schedule

## 2014-02-10 ENCOUNTER — Other Ambulatory Visit: Payer: Self-pay | Admitting: Physician Assistant

## 2014-02-11 ENCOUNTER — Other Ambulatory Visit: Payer: Self-pay | Admitting: Internal Medicine

## 2014-03-17 ENCOUNTER — Other Ambulatory Visit: Payer: Self-pay | Admitting: Internal Medicine

## 2014-04-25 ENCOUNTER — Ambulatory Visit (INDEPENDENT_AMBULATORY_CARE_PROVIDER_SITE_OTHER): Payer: BC Managed Care – PPO | Admitting: Internal Medicine

## 2014-04-25 ENCOUNTER — Encounter: Payer: Self-pay | Admitting: Internal Medicine

## 2014-04-25 ENCOUNTER — Ambulatory Visit (INDEPENDENT_AMBULATORY_CARE_PROVIDER_SITE_OTHER): Payer: BC Managed Care – PPO

## 2014-04-25 VITALS — BP 179/119 | HR 106 | Temp 97.9°F | Wt 341.4 lb

## 2014-04-25 DIAGNOSIS — F902 Attention-deficit hyperactivity disorder, combined type: Secondary | ICD-10-CM

## 2014-04-25 DIAGNOSIS — E119 Type 2 diabetes mellitus without complications: Secondary | ICD-10-CM

## 2014-04-25 DIAGNOSIS — G4733 Obstructive sleep apnea (adult) (pediatric): Secondary | ICD-10-CM

## 2014-04-25 DIAGNOSIS — D72829 Elevated white blood cell count, unspecified: Secondary | ICD-10-CM

## 2014-04-25 DIAGNOSIS — Z23 Encounter for immunization: Secondary | ICD-10-CM

## 2014-04-25 LAB — BASIC METABOLIC PANEL
BUN: 18 mg/dL (ref 6–23)
CO2: 21 mEq/L (ref 19–32)
Calcium: 9.4 mg/dL (ref 8.4–10.5)
Chloride: 102 mEq/L (ref 96–112)
Creatinine, Ser: 1.1 mg/dL (ref 0.4–1.5)
GFR: 83.92 mL/min (ref >60.00)
Glucose, Bld: 113 mg/dL — ABNORMAL HIGH (ref 70–99)
Potassium: 4 mEq/L (ref 3.5–5.1)
Sodium: 136 mEq/L (ref 135–145)

## 2014-04-25 LAB — HEMOGLOBIN A1C: Hgb A1c MFr Bld: 6.4 % (ref 4.6–6.5)

## 2014-04-25 NOTE — Progress Notes (Signed)
Subjective:    Patient ID: Spencer Good, male    DOB: [birth year], 38 y.o.   MRN: [id number]  DOS:  04/25/2014 Type of visit - description : rov  Interval history: Since the last office visit, he is doing well. Medications reviewed, good compliance Labs reviewed, due for a BMP and A1c.  ROS The patient is definitely exercising more, for the last 6 weeks is going to the gym almost daily. Has changed his diet as well and is loosing some weight. BP today is elevated but ambulatory BPs are usually in the 130s. Unable to use CPAP. Somehow concerned about drinking too much water, he drinks more than 3 liters a day. he also urinates a lot. Denies any genital rash, on invokana Back smoking, on Chantix  Past Medical History  Diagnosis Date  . Gout   . Tobacco abuse   . ADHD (attention deficit hyperactivity disorder)     per Dr Candis Schatz  . Diabetes   . OSA (obstructive sleep apnea) dx 05-2013    Rx a Cpap 3-15, can't use     Past Surgical History  Procedure Laterality Date  . No past surgeries      History   Social History  . Marital Status: Single    Spouse Name: N/A    Number of Children: 0  . 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 Smoking status: Former Smoker -- 1.00 packs/day for 20 years    Types: Cigarettes    Start date: 07/30/1992    Quit date: 12/24/2012  . Smokeless tobacco: Never Used     Comment: quit 1 year ago  then restarted  . Alcohol Use: Yes     Comment: rarely 1-2 monthly  . Drug Use: No  . Sexual Activity: Not on file   Other Topics Concern  . Not on file   Social History Narrative        Medication List       This list is accurate as of: 04/25/14  7:38 PM.  Always use your most recent med list.               b complex vitamins tablet  Take 1 tablet by mouth daily.     CHANTIX PO  Take 1 tablet by mouth 2 (two) times daily.     glucose blood test  strip  Use as instructed     INVOKANA 300 MG Tabs tablet  Generic drug:  canagliflozin  TAKE 1 TABLET BY MOUTH EVERY DAY     metFORMIN 850 MG tablet  Commonly known as:  GLUCOPHAGE  TAKE 1 TABLET BY MOUTH TWICE DAILY WITH MEALS     multivitamin tablet  Take 1 tablet by mouth daily.     ONE TOUCH LANCETS Misc  1 each by Does not apply route once.     VYVANSE 50 MG capsule  Generic drug:  lisdexamfetamine  Take 50 mg by mouth daily. Per Dr Karl Luke     zaleplon 10 MG capsule  Commonly known as:  SONATA  Take 10 mg by mouth as needed.           Objective:   Physical Exam BP 179/119 mmHg  Pulse 106  Temp(Src) 97.9 F (36.6 C) (Oral)  Wt 341 lb 6 oz (154.847 kg)  SpO2 99% General -- alert, well-developed, NAD.    Lungs -- normal respiratory effort,  no intercostal retractions, no accessory muscle use, and normal breath sounds.  Heart-- normal rate, regular rhythm, no murmur.   Extremities-- trace pretibial edema bilaterally  Neurologic--  alert & oriented X3. Speech normal, gait appropriate for age, strength symmetric and appropriate for age.  Psych-- Cognition and judgment appear intact. Cooperative with normal attention span and concentration. No anxious or depressed appearing.      Assessment & Plan:

## 2014-04-25 NOTE — Assessment & Plan Note (Signed)
Follow-up by psychiatry, Dr. Candis Schatz

## 2014-04-25 NOTE — Progress Notes (Signed)
Pre visit review using our clinic review tool, if applicable. No additional management support is needed unless otherwise documented below in the visit note. 

## 2014-04-25 NOTE — Patient Instructions (Signed)
Get your blood work before you leave    Check the  blood pressure 2 or 3 times a   Week  Be sure your blood pressure is between  145/85  and 110/65.  if it is consistently higher or lower, let me know     Please come back to the office in 4 months for a routine check up    Come back fasting

## 2014-04-25 NOTE — Assessment & Plan Note (Addendum)
Recently seen at hematology,no further w/u was needed at the time

## 2014-04-25 NOTE — Assessment & Plan Note (Addendum)
Doing better with diet and exercise, praised Drinking a lot of water, excessively?. Continue invokana and metformin Plan: BMP, A1c

## 2014-04-25 NOTE — Assessment & Plan Note (Signed)
CPAP intolerant, he returned the equipment

## 2014-05-15 ENCOUNTER — Other Ambulatory Visit: Payer: Self-pay | Admitting: Internal Medicine

## 2014-06-17 DIAGNOSIS — K429 Umbilical hernia without obstruction or gangrene: Secondary | ICD-10-CM

## 2014-06-17 HISTORY — DX: Umbilical hernia without obstruction or gangrene: K42.9

## 2014-07-17 ENCOUNTER — Other Ambulatory Visit: Payer: Self-pay | Admitting: Internal Medicine

## 2014-07-28 ENCOUNTER — Ambulatory Visit (HOSPITAL_BASED_OUTPATIENT_CLINIC_OR_DEPARTMENT_OTHER): Payer: BLUE CROSS/BLUE SHIELD | Admitting: Hematology & Oncology

## 2014-07-28 ENCOUNTER — Encounter: Payer: Self-pay | Admitting: Hematology & Oncology

## 2014-07-28 ENCOUNTER — Other Ambulatory Visit (HOSPITAL_BASED_OUTPATIENT_CLINIC_OR_DEPARTMENT_OTHER): Payer: BLUE CROSS/BLUE SHIELD | Admitting: Lab

## 2014-07-28 VITALS — BP 162/96 | HR 82 | Temp 98.0°F | Resp 20 | Ht 69.0 in | Wt 340.0 lb

## 2014-07-28 DIAGNOSIS — D649 Anemia, unspecified: Secondary | ICD-10-CM

## 2014-07-28 DIAGNOSIS — D72829 Elevated white blood cell count, unspecified: Secondary | ICD-10-CM

## 2014-07-28 LAB — CBC WITH DIFFERENTIAL (CANCER CENTER ONLY)
BASO#: 0.1 10*3/uL (ref 0.0–0.2)
BASO%: 0.5 % (ref 0.0–2.0)
EOS%: 3.4 % (ref 0.0–7.0)
Eosinophils Absolute: 0.4 10*3/uL (ref 0.0–0.5)
HCT: 44.5 % (ref 38.7–49.9)
HGB: 15.2 g/dL (ref 13.0–17.1)
LYMPH#: 4.4 10*3/uL — ABNORMAL HIGH (ref 0.9–3.3)
LYMPH%: 39.9 % (ref 14.0–48.0)
MCH: 27.8 pg — ABNORMAL LOW (ref 28.0–33.4)
MCHC: 34.2 g/dL (ref 32.0–35.9)
MCV: 82 fL (ref 82–98)
MONO#: 0.9 10*3/uL (ref 0.1–0.9)
MONO%: 8 % (ref 0.0–13.0)
NEUT#: 5.3 10*3/uL (ref 1.5–6.5)
NEUT%: 48.2 % (ref 40.0–80.0)
Platelets: 204 10*3/uL (ref 145–400)
RBC: 5.46 10*6/uL (ref 4.20–5.70)
RDW: 14.9 % (ref 11.1–15.7)
WBC: 10.9 10*3/uL — ABNORMAL HIGH (ref 4.0–10.0)

## 2014-07-28 LAB — CHCC SATELLITE - SMEAR

## 2014-07-28 NOTE — Progress Notes (Signed)
Hematology and Oncology Follow Up Visit  KEIL PICKERING [id number] 1976-05-30 39 y.o. 07/28/2014   Principle Diagnosis:   Mild, reactive leukocytosis  Current Therapy:    Observation     Interim History:  Mr.  Cerullo is in for his second office visit. We first saw back in August. His white count was minimally elevated at that point time.  He's doing okay. He is status post smoking. He is exercising more.  He's had no problems with infections. He's had no rashes. He's had no fever. He's had no change in bowel or bladder habits.  He's had no cough. He's had no leg swelling. He's had no palpable lymph glands.    Medications:  Current outpatient prescriptions:  .  lisdexamfetamine (VYVANSE) 50 MG capsule, Take 50 mg by mouth daily. Per Dr Karl Luke, Disp: , Rfl:  .  Multiple Vitamin (MULTIVITAMIN) tablet, Take 1 tablet by mouth daily., Disp: , Rfl:  .  zaleplon (SONATA) 10 MG capsule, Take 10 mg by mouth as needed., Disp: , Rfl:   Allergies: No Known Allergies  Past Medical History, Surgical history, Social history, and Family History were reviewed and updated.  Review of Systems: As above  Physical Exam:  height is 5\' 9"  (1.753 m) and weight is 340 lb (154.223 kg). His oral temperature is 98 F (36.7 C). His blood pressure is 162/96 and his pulse is 82. His respiration is 20.   Obese white gentleman in no obvious distress. Head and neck exam shows no ocular or oral lesions. He has no palpable cervical or supraclavicular lymph nodes. Lungs are clear. Cardiac exam regular rate and rhythm with no murmurs, rubs or bruits. Abdomen is soft. He is obese. He has no fluid wave. There is no palpable liver or spleen tip. Back exam shows no tenderness over the spine, ribs or hips. Extremities shows no clubbing, cyanosis or edema. He has good range of motion of his joints. He has good strength in his extremities. Skin exam shows no rashes, ecchymoses or petechia. Neurological exam is  nonfocal.  Lab Results  Component Value Date   WBC 10.9* 07/28/2014   HGB 15.2 07/28/2014   HCT 44.5 07/28/2014   MCV 82 07/28/2014   PLT 204 07/28/2014     Chemistry      Component Value Date/Time   NA 136 04/25/2014 0929   K 4.0 04/25/2014 0929   CL 102 04/25/2014 0929   CO2 21 04/25/2014 0929   BUN 18 04/25/2014 0929   CREATININE 1.1 04/25/2014 0929      Component Value Date/Time   CALCIUM 9.4 04/25/2014 0929   ALKPHOS 73 09/14/2013 0930   AST 28 09/14/2013 0930   ALT 40 09/14/2013 0930   BILITOT 0.4 09/14/2013 0930         Impression and Plan: Mr. Desai is 39 year old white gentleman with minimal leukocytosis. I looked his blood smear. He has good maturation of his white blood cells. I do not see any immature myeloid or lymphoid cells. There are no hypersegmented polys. He has normal red cell morphology. Platelets are adequate in number and size.  I just do not see any hematologic issue with him. his blood smear is unremarkable. His physical exam is unremarkable.  I just think that the leukocytosis is somewhat reactive. He is asymptomatic with this. I don't see any evidence of a bone marrow problem.  As nice as he is, I just don't think we have to get him back to the office.  I spent about 30 minutes with him I went over his labs and explained my recommendations. He is definitely okay with not having to come back here. Volanda Napoleon, MD 2/11/20165:32 PM

## 2014-07-29 LAB — IRON AND TIBC CHCC
%SAT: 22 % (ref 20–55)
Iron: 72 ug/dL (ref 42–163)
TIBC: 328 ug/dL (ref 202–409)
UIBC: 256 ug/dL (ref 117–376)

## 2014-07-29 LAB — FERRITIN CHCC: Ferritin: 154 ng/ml (ref 22–316)

## 2014-08-24 ENCOUNTER — Ambulatory Visit: Payer: BC Managed Care – PPO | Admitting: Internal Medicine

## 2014-09-29 ENCOUNTER — Ambulatory Visit (INDEPENDENT_AMBULATORY_CARE_PROVIDER_SITE_OTHER): Payer: BLUE CROSS/BLUE SHIELD | Admitting: Internal Medicine

## 2014-09-29 ENCOUNTER — Encounter: Payer: Self-pay | Admitting: Internal Medicine

## 2014-09-29 VITALS — BP 142/82 | HR 100 | Temp 98.1°F | Ht 69.0 in | Wt 339.5 lb

## 2014-09-29 DIAGNOSIS — E119 Type 2 diabetes mellitus without complications: Secondary | ICD-10-CM

## 2014-09-29 LAB — HM DIABETES FOOT EXAM: HM Diabetic Foot Exam: NORMAL

## 2014-09-29 LAB — HEMOGLOBIN A1C: Hgb A1c MFr Bld: 6.4 % (ref 4.6–6.5)

## 2014-09-29 NOTE — Assessment & Plan Note (Addendum)
Last A1c was excellent flow he was taking invokana-metformin. He improved his lifestyle significantly, lost 6 pounds and decided to stop the medication 06-2014. He plans to continue doing well with diet and exercise despite the fact that he will be traveling for work for the next 2 months. Feet exam negative today. Plan: Check A1c, depending on results he is willing to go back on medication. Follow-up 12-2014

## 2014-09-29 NOTE — Progress Notes (Signed)
   Subjective:    Patient ID: Spencer Good, male    DOB: 12/28/1975, 39 y.o.   MRN: [id number]  DOS:  09/29/2014 Type of visit - description : rov Interval history: Patient self discontinue diabetes medications 06-2014 as he improved his lifestyle  With medications, blood sugar was around 90, without meds but w/ a  better lifestyle sugar was around 110 postprandial.  Review of Systems  denies chest pain or difficulty breathing No nausea, vomiting, diarrhea   Past Medical History  Diagnosis Date  . Gout   . Tobacco abuse   . ADHD (attention deficit hyperactivity disorder)     per Dr Candis Schatz  . Diabetes   . OSA (obstructive sleep apnea) dx 05-2013    Rx a Cpap 3-15, can't use     Past Surgical History  Procedure Laterality Date  . No past surgeries      History   Social History  . Marital Status: Single    Spouse Name: N/A  . Number of Children: 0  . 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 Smoking status: Former Smoker -- 1.00 packs/day for 20 years    Types: Cigarettes    Start date: 07/30/1992    Quit date: 04/26/2014  . Smokeless tobacco: Never Used     Comment: Nov. 2015  . Alcohol Use: 0.0 oz/week    0 Standard drinks or equivalent per week     Comment: rarely 1-2 monthly  . Drug Use: No  . Sexual Activity: Not on file   Other Topics Concern  . Not on file   Social History Narrative        Medication List       This list is accurate as of: 09/29/14  6:09 PM.  Always use your most recent med list.               multivitamin tablet  Take 1 tablet by mouth daily.     VYVANSE 50 MG capsule  Generic drug:  lisdexamfetamine  Take 50 mg by mouth daily. Per Dr Karl Luke     zaleplon 10 MG capsule  Commonly known as:  SONATA  Take 10 mg by mouth as needed.           Objective:   Physical Exam BP 142/82 mmHg  Pulse 100  Temp(Src) 98.1 F (36.7 C) (Oral)  Ht  5\' 9"  (1.753 m)  Wt 339 lb 8 oz (153.996 kg)  BMI 50.11 kg/m2  SpO2 94% General:   Well developed, well nourished . NAD.  HEENT:  Normocephalic . Face symmetric, atraumatic Lungs:  CTA B Normal respiratory effort, no intercostal retractions, no accessory muscle use. Heart: RRR,  no murmur.  Diabetes foot exam: Good pedal pulses, normal pinprick examination, skin is rough and dry but no evidence of infection. Psych--  Cognition and judgment appear intact.  Cooperative with normal attention span and concentration.  Behavior appropriate. No anxious or depressed appearing.      Assessment & Plan:

## 2014-09-29 NOTE — Patient Instructions (Signed)
Get your blood work before you leave    Come back to the office by 12-2014   for a physical exam  Please schedule an appointment at the front desk    Come back fasting

## 2014-09-29 NOTE — Progress Notes (Signed)
Pre visit review using our clinic review tool, if applicable. No additional management support is needed unless otherwise documented below in the visit note. 

## 2014-10-09 ENCOUNTER — Encounter: Payer: Self-pay | Admitting: Internal Medicine

## 2014-10-18 ENCOUNTER — Ambulatory Visit: Payer: Self-pay | Admitting: Internal Medicine

## 2014-10-21 ENCOUNTER — Emergency Department (INDEPENDENT_AMBULATORY_CARE_PROVIDER_SITE_OTHER): Payer: BLUE CROSS/BLUE SHIELD

## 2014-10-21 ENCOUNTER — Encounter (HOSPITAL_COMMUNITY): Payer: Self-pay | Admitting: Emergency Medicine

## 2014-10-21 ENCOUNTER — Emergency Department (HOSPITAL_COMMUNITY)
Admission: EM | Admit: 2014-10-21 | Discharge: 2014-10-21 | Disposition: A | Discharge status: Home / Self Care | Payer: BLUE CROSS/BLUE SHIELD | Source: Home / Self Care | Attending: Family Medicine | Admitting: Family Medicine

## 2014-10-21 DIAGNOSIS — K429 Umbilical hernia without obstruction or gangrene: Secondary | ICD-10-CM

## 2014-10-21 DIAGNOSIS — R141 Gas pain: Secondary | ICD-10-CM | POA: Diagnosis not present

## 2014-10-21 DIAGNOSIS — R1084 Generalized abdominal pain: Secondary | ICD-10-CM

## 2014-10-21 NOTE — ED Provider Notes (Signed)
CSN: [id number]     Arrival date & time 10/21/14  0816 History   First MD Initiated Contact with Patient 10/21/14 [phone]     Chief Complaint  Patient presents with  . Abdominal Pain   (Consider location/radiation/quality/duration/timing/severity/associated sxs/prior Treatment) HPI Comments: 39 year old obese male complaining of generalized abdominal pain for one week. The pain has been coming and going. It is worse across the lower abdomen. Yesterday he took 4 doses of milk of magnesia and no bowel movement. He has had some liquid stool but still feels like he has a mass of stool in his lower abdomen. Yesterday while on the toilet he felt sweaty, weak and so he will going to pass out but did not lose consciousness or have a syncopal episode. He also has a chronic umbilical hernia which has been getting larger recently and now much more tender.   Past Medical History  Diagnosis Date  . Gout   . Tobacco abuse   . ADHD (attention deficit hyperactivity disorder)     per Dr Candis Schatz  . Diabetes   . OSA (obstructive sleep apnea) dx 05-2013    Rx a Cpap 3-15, can't use    Past Surgical History  Procedure Laterality Date  . No past surgeries     Family History  Problem Relation Age of Onset  . Colon cancer Neg Hx   . Prostate cancer Neg Hx   . CAD Other     MGF  . Stroke Father     F, PGF  . Diabetes Neg Hx     distant fam members  . Heart disease Maternal Grandfather    History  Substance Use Topics  . Smoking status: Former Smoker -- 1.00 packs/day for 20 years    Types: Cigarettes    Start date: 07/30/1992    Quit date: 04/26/2014  . Smokeless tobacco: Never Used     Comment: Nov. 2015  . Alcohol Use: 0.0 oz/week    0 Standard drinks or equivalent per week     Comment: rarely 1-2 monthly    Review of Systems  Constitutional: Positive for activity change. Negative for fever and fatigue.  Respiratory: Negative.   Cardiovascular: Negative.   Gastrointestinal: Positive for  abdominal pain, diarrhea, constipation and abdominal distention. Negative for nausea and vomiting.  Genitourinary: Negative.   Musculoskeletal: Negative.   Skin: Negative.     Allergies  Review of patient's allergies indicates no known allergies.  Home Medications   Prior to Admission medications   Medication Sig Start Date End Date Taking? Authorizing Provider  lisdexamfetamine (VYVANSE) 50 MG capsule Take 50 mg by mouth daily. Per Dr Karl Luke   Yes Historical Provider, MD  Multiple Vitamin (MULTIVITAMIN) tablet Take 1 tablet by mouth daily.    Historical Provider, MD  zaleplon (SONATA) 10 MG capsule Take 10 mg by mouth as needed.    Historical Provider, MD   BP 145/97 mmHg  Pulse 119  Temp(Src) 98.1 F (36.7 C) (Oral)  Resp 18  SpO2 95% Physical Exam  Constitutional: He is oriented to person, place, and time. He appears well-developed and well-nourished. No distress.  Neck: Normal range of motion. Neck supple.  Cardiovascular: Normal rate, regular rhythm and normal heart sounds.   Pulmonary/Chest: Effort normal and breath sounds normal. No respiratory distress. He has no wheezes.  Abdominal: Soft. Bowel sounds are normal. He exhibits distension.  Generalized abdominal tenderness. There is a small umbilical hernia which is tender. It enlarges with increase in intra  abd pressure. When supine recudible but with pain.  There is tenderness across the lower abdomen to light palpation. Most of the abdomen percusses dull to flat with tympany in the epigastrium.  Musculoskeletal: He exhibits no edema.  Neurological: He is alert and oriented to person, place, and time. He exhibits normal muscle tone.  Skin: Skin is warm and dry.  Psychiatric: He has a normal mood and affect.  Nursing note and vitals reviewed.   ED Course  Procedures (including critical care time) Labs Review Labs Reviewed - No data to display  Imaging Review Dg Abd 2 Views  10/21/2014   CLINICAL DATA:  39 year old  male with a history of stomach pain for 1 week  EXAM: ABDOMEN - 2 VIEW  COMPARISON:  None.  FINDINGS: Gas within central small bowel and colon. No abnormally distended small bowel loops or colonic loops. Small amount of gas within the stomach, which is not distended.  No abnormal air-fluid levels.  No unexpected radiopaque foreign bodies. No unexpected calcifications.  Visualized silhouette of the liver, spleen, bilateral kidneys unremarkable.  No displaced fracture.  IMPRESSION: Plain film is negative for acute abnormality.  .   Electronically Signed   By: Corrie Mckusick D.O.   On: 10/21/2014 09:32     MDM   1. Generalized abdominal pain   2. Abdominal gas pain   3. Umbilical hernia without obstruction or gangrene    MiraLAX as directed. If after having good bowel movements you continue to have abdominal pain follow-up with your doctor or if pain is moderate to severe go to the emergency department. No straining. After further discussion with the patient regarding his umbilical hernia he states it really no worse than it usually is. He states is always tender and has not changed and years. He is advised to see a surgeon for any problems that develop regarding hernia. Tickly, if there is a bulge that will not call away, if there is discoloration, increased pain he should seek medical attention promptly.    Janne Napoleon, NP 10/21/14 1011

## 2014-10-21 NOTE — Discharge Instructions (Signed)
Abdominal Pain Many things can cause abdominal pain. Usually, abdominal pain is not caused by a disease and will improve without treatment. It can often be observed and treated at home. Your health care provider will do a physical exam and possibly order blood tests and X-rays to help determine the seriousness of your pain. However, in many cases, more time must pass before a clear cause of the pain can be found. Before that point, your health care provider may not know if you need more testing or further treatment. HOME CARE INSTRUCTIONS  Monitor your abdominal pain for any changes. The following actions may help to alleviate any discomfort you are experiencing:  Only take over-the-counter or prescription medicines as directed by your health care provider.  Do not take laxatives unless directed to do so by your health care provider.  Try a clear liquid diet (broth, tea, or water) as directed by your health care provider. Slowly move to a bland diet as tolerated. SEEK MEDICAL CARE IF:  You have unexplained abdominal pain.  You have abdominal pain associated with nausea or diarrhea.  You have pain when you urinate or have a bowel movement.  You experience abdominal pain that wakes you in the night.  You have abdominal pain that is worsened or improved by eating food.  You have abdominal pain that is worsened with eating fatty foods.  You have a fever. SEEK IMMEDIATE MEDICAL CARE IF:   Your pain does not go away within 2 hours.  You keep throwing up (vomiting).  Your pain is felt only in portions of the abdomen, such as the right side or the left lower portion of the abdomen.  You pass bloody or black tarry stools. MAKE SURE YOU:  Understand these instructions.   Will watch your condition.   Will get help right away if you are not doing well or get worse.  Document Released: 03/13/2005 Document Revised: 06/08/2013 Document Reviewed: 02/10/2013 [facility] Patient Information  2015 [place], [place]. This information is not intended to replace advice given to you by your health care provider. Make sure you discuss any questions you have with your health care provider.  Hernia A hernia happens when an organ inside your body pushes out through a weak spot in your belly (abdominal) wall. Most hernias get worse over time. They can often be pushed back into place (reduced). Surgery may be needed to repair hernias that cannot be pushed into place. HOME CARE  Keep doing normal activities.  Avoid lifting more than 10 pounds (4.5 kilograms).  Cough gently and avoid straining. Over time, these things will:  Increase your hernia size.  Irritate your hernia.  Break down hernia repairs.  Stop smoking.  Do not wear anything tight over your hernia. Do not keep the hernia in with an outside bandage.  Eat food that is high in fiber (fruit, vegetables, whole grains).  Drink enough fluids to keep your pee (urine) clear or pale yellow.  Take medicines to make your poop soft (stool softeners) if you cannot poop (constipated). GET HELP RIGHT AWAY IF:   You have a fever.  You have belly pain that gets worse.  You feel sick to your stomach (nauseous) and throw up (vomit).  Your skin starts to bulge out.  Your hernia turns a different color, feels hard, or is tender.  You have increased pain or puffiness (swelling) around the hernia.  You poop more or less often.  Your poop does not look the way normally  does.  You have watery poop (diarrhea).  You cannot push the hernia back in place by applying gentle pressure while lying down. MAKE SURE YOU:   Understand these instructions.  Will watch your condition.  Will get help right away if you are not doing well or get worse. Document Released: 11/21/2009 Document Revised: 08/26/2011 Document Reviewed: 11/21/2009 [facility] Patient Information 2015 [place], [place]. This information is not intended to replace advice  given to you by your health care provider. Make sure you discuss any questions you have with your health care provider.

## 2014-10-21 NOTE — ED Notes (Signed)
C/o lower abd pain onset 1 week; last 2 days has been getting worse w/diarrhea Feels like he still has to go to the bathroom after each loose stools Yest sx included light headed, fevers, chills, diaphoresis Hx of hernia Alert, no signs of acute distress.

## 2014-10-23 ENCOUNTER — Emergency Department (HOSPITAL_COMMUNITY): Payer: BLUE CROSS/BLUE SHIELD

## 2014-10-23 ENCOUNTER — Inpatient Hospital Stay (HOSPITAL_COMMUNITY)
Admission: EM | Admit: 2014-10-23 | Discharge: 2014-10-25 | DRG: 392 | Disposition: A | Discharge status: Home / Self Care | Payer: BLUE CROSS/BLUE SHIELD | Attending: Internal Medicine | Admitting: Internal Medicine

## 2014-10-23 ENCOUNTER — Encounter (HOSPITAL_COMMUNITY): Payer: Self-pay | Admitting: Emergency Medicine

## 2014-10-23 DIAGNOSIS — D649 Anemia, unspecified: Secondary | ICD-10-CM

## 2014-10-23 DIAGNOSIS — K5720 Diverticulitis of large intestine with perforation and abscess without bleeding: Secondary | ICD-10-CM | POA: Diagnosis present

## 2014-10-23 DIAGNOSIS — M109 Gout, unspecified: Secondary | ICD-10-CM | POA: Diagnosis present

## 2014-10-23 DIAGNOSIS — E119 Type 2 diabetes mellitus without complications: Secondary | ICD-10-CM

## 2014-10-23 DIAGNOSIS — F909 Attention-deficit hyperactivity disorder, unspecified type: Secondary | ICD-10-CM | POA: Diagnosis present

## 2014-10-23 DIAGNOSIS — G4733 Obstructive sleep apnea (adult) (pediatric): Secondary | ICD-10-CM | POA: Diagnosis present

## 2014-10-23 DIAGNOSIS — F17200 Nicotine dependence, unspecified, uncomplicated: Secondary | ICD-10-CM | POA: Diagnosis present

## 2014-10-23 DIAGNOSIS — K5900 Constipation, unspecified: Secondary | ICD-10-CM | POA: Diagnosis present

## 2014-10-23 DIAGNOSIS — K429 Umbilical hernia without obstruction or gangrene: Secondary | ICD-10-CM | POA: Diagnosis present

## 2014-10-23 DIAGNOSIS — Z87891 Personal history of nicotine dependence: Secondary | ICD-10-CM

## 2014-10-23 DIAGNOSIS — R109 Unspecified abdominal pain: Secondary | ICD-10-CM | POA: Diagnosis not present

## 2014-10-23 DIAGNOSIS — D72829 Elevated white blood cell count, unspecified: Secondary | ICD-10-CM

## 2014-10-23 LAB — COMPREHENSIVE METABOLIC PANEL
ALT: 37 U/L (ref 17–63)
AST: 22 U/L (ref 15–41)
Albumin: 3.4 g/dL — ABNORMAL LOW (ref 3.5–5.0)
Alkaline Phosphatase: 92 U/L (ref 38–126)
Anion gap: 11 (ref 5–15)
BUN: 9 mg/dL (ref 6–20)
CO2: 24 mmol/L (ref 22–32)
Calcium: 9.1 mg/dL (ref 8.9–10.3)
Chloride: 103 mmol/L (ref 101–111)
Creatinine, Ser: 0.89 mg/dL (ref 0.61–1.24)
GFR calc Af Amer: >60 mL/min (ref >60)
GFR calc non Af Amer: >60 mL/min (ref >60)
Glucose, Bld: 134 mg/dL — ABNORMAL HIGH (ref 70–99)
Potassium: 3.8 mmol/L (ref 3.5–5.1)
Sodium: 138 mmol/L (ref 135–145)
Total Bilirubin: 0.9 mg/dL (ref 0.3–1.2)
Total Protein: 7 g/dL (ref 6.5–8.1)

## 2014-10-23 LAB — CBC WITH DIFFERENTIAL/PLATELET
Basophils Absolute: 0 10*3/uL (ref 0.0–0.1)
Basophils Relative: 0 % (ref 0–1)
Eosinophils Absolute: 0.3 10*3/uL (ref 0.0–0.7)
Eosinophils Relative: 2 % (ref 0–5)
HCT: 42 % (ref 39.0–52.0)
Hemoglobin: 14.1 g/dL (ref 13.0–17.0)
Lymphocytes Relative: 25 % (ref 12–46)
Lymphs Abs: 3 10*3/uL (ref 0.7–4.0)
MCH: 27.5 pg (ref 26.0–34.0)
MCHC: 33.6 g/dL (ref 30.0–36.0)
MCV: 82 fL (ref 78.0–100.0)
Monocytes Absolute: 0.9 10*3/uL (ref 0.1–1.0)
Monocytes Relative: 7 % (ref 3–12)
Neutro Abs: 7.9 10*3/uL — ABNORMAL HIGH (ref 1.7–7.7)
Neutrophils Relative %: 66 % (ref 43–77)
Platelets: 192 10*3/uL (ref 150–400)
RBC: 5.12 MIL/uL (ref 4.22–5.81)
RDW: 14.9 % (ref 11.5–15.5)
WBC: 12 10*3/uL — ABNORMAL HIGH (ref 4.0–10.5)

## 2014-10-23 LAB — GLUCOSE, CAPILLARY
Glucose-Capillary: 113 mg/dL — ABNORMAL HIGH (ref 70–99)
Glucose-Capillary: 96 mg/dL (ref 70–99)
Glucose-Capillary: 93 mg/dL (ref 70–99)

## 2014-10-23 MED ORDER — ENOXAPARIN SODIUM 40 MG/0.4ML ~~LOC~~ SOLN
40.0000 mg | Freq: Every day | SUBCUTANEOUS | Status: DC
  Filled 2014-10-23: qty 0.4

## 2014-10-23 MED ORDER — PIPERACILLIN-TAZOBACTAM 3.375 G IVPB
3.3750 g | Freq: Once | INTRAVENOUS | Status: DC
  Administered 2014-10-23: 3.375 g via INTRAVENOUS
  Filled 2014-10-23: qty 50

## 2014-10-23 MED ORDER — ENOXAPARIN SODIUM 80 MG/0.8ML ~~LOC~~ SOLN
70.0000 mg | SUBCUTANEOUS | Status: DC
  Administered 2014-10-23 – 2014-10-24 (×2): 70 mg via SUBCUTANEOUS
  Filled 2014-10-23 (×3): qty 0.8

## 2014-10-23 MED ORDER — ACETAMINOPHEN 325 MG PO TABS: 650.0000 mg | ORAL_TABLET | Freq: Four times a day (QID) | ORAL | Status: DC | PRN

## 2014-10-23 MED ORDER — SODIUM CHLORIDE 0.9 % IV SOLN
INTRAVENOUS | Status: DC
  Administered 2014-10-23 – 2014-10-24 (×2): via INTRAVENOUS

## 2014-10-23 MED ORDER — IOHEXOL 300 MG/ML  SOLN
25.0000 mL | Freq: Once | INTRAMUSCULAR | Status: AC | PRN
  Administered 2014-10-23: 25 mL via ORAL

## 2014-10-23 MED ORDER — ONDANSETRON HCL 4 MG/2ML IJ SOLN: 4.0000 mg | Freq: Four times a day (QID) | INTRAMUSCULAR | Status: DC | PRN

## 2014-10-23 MED ORDER — ONDANSETRON HCL 4 MG PO TABS: 4.0000 mg | ORAL_TABLET | Freq: Four times a day (QID) | ORAL | Status: DC | PRN

## 2014-10-23 MED ORDER — METRONIDAZOLE IN NACL 5-0.79 MG/ML-% IV SOLN
500.0000 mg | Freq: Three times a day (TID) | INTRAVENOUS | Status: DC
  Administered 2014-10-23 – 2014-10-25 (×6): 500 mg via INTRAVENOUS
  Filled 2014-10-23 (×8): qty 100

## 2014-10-23 MED ORDER — ZOLPIDEM TARTRATE 5 MG PO TABS
5.0000 mg | ORAL_TABLET | Freq: Every evening | ORAL | Status: DC | PRN
  Administered 2014-10-23 – 2014-10-24 (×2): 5 mg via ORAL
  Filled 2014-10-23 (×2): qty 1

## 2014-10-23 MED ORDER — IOHEXOL 300 MG/ML  SOLN
100.0000 mL | Freq: Once | INTRAMUSCULAR | Status: AC | PRN
  Administered 2014-10-23: 100 mL via INTRAVENOUS

## 2014-10-23 MED ORDER — ACETAMINOPHEN 650 MG RE SUPP: 650.0000 mg | Freq: Four times a day (QID) | RECTAL | Status: DC | PRN

## 2014-10-23 MED ORDER — CIPROFLOXACIN IN D5W 400 MG/200ML IV SOLN
400.0000 mg | Freq: Two times a day (BID) | INTRAVENOUS | Status: DC
Start: 2014-10-23 — End: 2014-10-25
  Administered 2014-10-23 – 2014-10-25 (×4): 400 mg via INTRAVENOUS
  Filled 2014-10-23 (×5): qty 200

## 2014-10-23 MED ORDER — MORPHINE SULFATE 2 MG/ML IJ SOLN: 1.0000 mg | INTRAMUSCULAR | Status: DC | PRN

## 2014-10-23 MED ORDER — PNEUMOCOCCAL VAC POLYVALENT 25 MCG/0.5ML IJ INJ
0.5000 mL | INJECTION | INTRAMUSCULAR | Status: AC
  Administered 2014-10-24: 0.5 mL via INTRAMUSCULAR
  Filled 2014-10-23: qty 0.5

## 2014-10-23 NOTE — ED Notes (Signed)
Hospitalist at bedside 

## 2014-10-23 NOTE — ED Provider Notes (Signed)
39 year old male, no prior abdominal surgical history, known umbilical hernia, presents with abdominal pain and intermittent colicky crampy pain, occasional liquid stools, acutely worsened after using milk of magnesia multiple times in one day. X-rays from 2 days ago reviewed showing no acute process, on exam has diffuse mildly tender abdomen without guarding or peritoneal signs, no focality, decreased bowel sounds, no tympanitic sounds to percussion, reproducible umbilical hernia mildly tender, heart and lung sounds normal, no acute distress. Due to the ongoing nature of the patient's symptoms and the diffuse nature of his pain we'll obtain a CT scan to further evaluate the source including colitis, inflammatory bowel disease or bowel obstruction though this would be less likely given no vomiting. Labs pending, pain medications for comfort.  CT with contained perforation, admitted for abx and pain control  Medical screening examination/treatment/procedure(s) were conducted as a shared visit with non-physician practitioner(s) and myself.  I personally evaluated the patient during the encounter.  Clinical Impression:   Final diagnoses:  Diverticulitis of large intestine with perforation without bleeding         Noemi Chapel, MD 10/23/14 [phone]

## 2014-10-23 NOTE — ED Notes (Signed)
Pt c/o abdominal pain and intermittent bouts of constipation for about 2 weeks. Pt has tried milk of magnesia without relief.  Pt has history of umbilical hernia and tried to push it in recently which caused him to feel lightheaded and become diaphoretic. Pt has had small episodes of slimy green diarrhea.

## 2014-10-23 NOTE — Progress Notes (Signed)
New A dmission Note:   Arrival Method:  Via wheelchair from ED Mental Orientation:  A & O x 4 Telemetry: None Assessment: Completed Skin:  Intact IV:  Left FA Pain: Refuses pain medication Tubes:  None Safety Measures: Safety Fall Prevention Plan has been given, discussed and signed Admission: Completed [address] Orientation: Patient has been orientated to the room, unit and staff.  Family:  None at bedside  Orders have been reviewed and implemented. Will continue to monitor the patient. Call light has been placed within reach and bed alarm has been activated.   Earleen Reaper RN- [place], [place] Phone number: [phone]

## 2014-10-23 NOTE — Progress Notes (Signed)
ANTICOAGULATION CONSULT NOTE - Initial Consult  Pharmacy Consult for Enoxaparin Indication: VTE prophylaxis  No Known Allergies  Patient Measurements: Height: 6' (182.9 cm) Weight: (!) 325 lb (147.419 kg) IBW/kg (Calculated) : 77.6  Vital Signs: Temp: 98.5 F (36.9 C) (05/08 1107) Temp Source: Oral (05/08 1107) BP: 144/84 mmHg (05/08 1107) Pulse Rate: 89 (05/08 1107)  Labs:  Recent Labs  10/23/14 0747  HGB 14.1  HCT 42.0  PLT 192  CREATININE 0.89    Estimated Creatinine Clearance: 171.2 mL/min (by C-G formula based on Cr of 0.89).   Medical History: Past Medical History  Diagnosis Date  . Gout   . Tobacco abuse   . ADHD (attention deficit hyperactivity disorder)     per Dr Candis Schatz  . Diabetes   . OSA (obstructive sleep apnea) dx 05-2013    Rx a Cpap 3-15, can't use    Assessment: 39 yo M presents on 5/8 with bilateral leg pain x 3 weeks. Pharmacy consulted to dose lovenox for VTE prophylaxis. Due to BMI > 30, will give 0.5mg /kg SQ Q24.  Goal of Therapy:  VTE prophylaxis Monitor platelets by anticoagulation protocol: Yes   Plan:  Start lovenox 70mg  SQ Q24 Monitor CBC, s/s of bleed  Jumana Paccione J 10/23/2014,1:19 PM

## 2014-10-23 NOTE — H&P (Signed)
[facility] History and Physical  TRAYSHAWN DURKIN LFY:[id number] DOB: September 03, 1975 DOA: 10/23/2014   PCP: Kathlene November, MD  Specialists: Followed by a psychiatrist  Chief Complaint: Abdominal pain, worse since Thursday  HPI: Spencer Good is a 39 y.o. male with a past medical history of diet-controlled diabetes, obstructive sleep apnea intolerant of CPAP, ADHD, who was in his usual state of health till about 2 weeks ago when he started noticing some discomfort in his lower abdomen and in the sides. However, he was able to eat and was having bowel movements, although he did have a feeling of incomplete evacuation at times. This past Wednesday he did not have a bowel movement. On Thursday, the pain got worse. He took milk of magnesia with no relief. He has a chronic umbilical hernia which he tried to push in and then got dizzy and lightheaded as a result of the same. He took some chicken broth with no relief. Finally, on Friday he went to the urgent care center. They did a x-ray and then prescribed him miralax. The pain got better. However, he did not have any bowel movements. Saturday afternoon he tried to eat and gave himself an enema. Did not have any relief, so decided to come into the hospital today. Pain has been 5-6/10 in intensity. Sharp pain without radiation. It got aggravated by pushing on his hernia. No relieving factors. No precipitant factors. Denies any nausea, vomiting. No fever, no chills. He's never had similar symptoms before. He's never had colonoscopy. Denies any chronic constipation. Does not strain in order to have a bowel movement usually.  Home Medications: Prior to Admission medications   Medication Sig Start Date End Date Taking? Authorizing Provider  lisdexamfetamine (VYVANSE) 50 MG capsule Take 50 mg by mouth daily. Per Dr Karl Luke   Yes Historical Provider, MD  Multiple Vitamin (MULTIVITAMIN) tablet Take 1 tablet by mouth daily.   Yes Historical Provider, MD  zaleplon  (SONATA) 10 MG capsule Take 10 mg by mouth as needed.   Yes Historical Provider, MD    Allergies: No Known Allergies  Past Medical History: Past Medical History  Diagnosis Date  . Gout   . Tobacco abuse   . ADHD (attention deficit hyperactivity disorder)     per Dr Candis Schatz  . Diabetes   . OSA (obstructive sleep apnea) dx 05-2013    Rx a Cpap 3-15, can't use     Past Surgical History  Procedure Laterality Date  . No past surgeries      Social History: Lives in [place] by himself. He works as a Occupational psychologist for [employer]. Smokes occasionally, drinks occasionally. No illicit drug use. Independent with daily activities.  Family History:  Family History  Problem Relation Age of Onset  . Colon cancer Neg Hx   . Prostate cancer Neg Hx   . Diabetes Neg Hx     distant fam members  . CAD Other     MGF  . Stroke Father     F, PGF  . Heart disease Maternal Grandfather      Review of Systems - History obtained from the patient General ROS: positive for  - fatigue Psychological ROS: negative Ophthalmic ROS: negative ENT ROS: negative Allergy and Immunology ROS: negative Hematological and Lymphatic ROS: negative Endocrine ROS: negative Respiratory ROS: no cough, shortness of breath, or wheezing Cardiovascular ROS: no chest pain or dyspnea on exertion Gastrointestinal ROS: as in hpi Genito-Urinary ROS: no dysuria, trouble voiding, or  hematuria Musculoskeletal ROS: negative Neurological ROS: no TIA or stroke symptoms Dermatological ROS: negative  Physical Examination  Filed Vitals:   10/23/14 0825 10/23/14 0900 10/23/14 0915 10/23/14 0945  BP:  146/84 143/75 152/85  Pulse:   90 91  Temp: 99.4 F (37.4 C)     TempSrc: Oral     Resp:      Height:      Weight:      SpO2:   95% 94%    BP 152/85 mmHg  Pulse 91  Temp(Src) 99.4 F (37.4 C) (Oral)  Resp 18  Ht 6' (1.829 m)  Wt 147.419 kg (325 lb)  BMI 44.07 kg/m2  SpO2 94%  General  appearance: alert, cooperative, appears stated age, no distress and moderately obese Head: Normocephalic, without obvious abnormality, atraumatic Eyes: conjunctivae/corneas clear. PERRL, EOM's intact.  Throat: lips, mucosa, and tongue normal; teeth and gums normal Neck: no adenopathy, no carotid bruit, no JVD, supple, symmetrical, trachea midline and thyroid not enlarged, symmetric, no tenderness/mass/nodules Resp: clear to auscultation bilaterally Cardio: regular rate and rhythm, S1, S2 normal, no murmur, click, rub or gallop GI: Abdomen is soft. Obese. Umbilical hernia is present, which is somewhat reducible. Tender in the lower abdomen without any rebound, rigidity or guarding. No masses or organomegaly. Extremities: extremities normal, atraumatic, no cyanosis or edema Pulses: 2+ and symmetric Skin: Skin color, texture, turgor normal. No rashes or lesions Lymph nodes: Cervical, supraclavicular, and axillary nodes normal. Neurologic: No focal deficits.  Laboratory Data: Results for orders placed or performed during the hospital encounter of 10/23/14 (from the past 48 hour(s))  CBC with Differential     Status: Abnormal   Collection Time: 10/23/14  7:47 AM  Result Value Ref Range   WBC 12.0 (H) 4.0 - 10.5 K/uL   RBC 5.12 4.22 - 5.81 MIL/uL   Hemoglobin 14.1 13.0 - 17.0 g/dL   HCT 42.0 39.0 - 52.0 %   MCV 82.0 78.0 - 100.0 fL   MCH 27.5 26.0 - 34.0 pg   MCHC 33.6 30.0 - 36.0 g/dL   RDW 14.9 11.5 - 15.5 %   Platelets 192 150 - 400 K/uL   Neutrophils Relative % 66 43 - 77 %   Neutro Abs 7.9 (H) 1.7 - 7.7 K/uL   Lymphocytes Relative 25 12 - 46 %   Lymphs Abs 3.0 0.7 - 4.0 K/uL   Monocytes Relative 7 3 - 12 %   Monocytes Absolute 0.9 0.1 - 1.0 K/uL   Eosinophils Relative 2 0 - 5 %   Eosinophils Absolute 0.3 0.0 - 0.7 K/uL   Basophils Relative 0 0 - 1 %   Basophils Absolute 0.0 0.0 - 0.1 K/uL  Comprehensive metabolic panel     Status: Abnormal   Collection Time: 10/23/14  7:47 AM    Result Value Ref Range   Sodium 138 135 - 145 mmol/L   Potassium 3.8 3.5 - 5.1 mmol/L   Chloride 103 101 - 111 mmol/L   CO2 24 22 - 32 mmol/L   Glucose, Bld 134 (H) 70 - 99 mg/dL   BUN 9 6 - 20 mg/dL   Creatinine, Ser 0.89 0.61 - 1.24 mg/dL   Calcium 9.1 8.9 - 10.3 mg/dL   Total Protein 7.0 6.5 - 8.1 g/dL   Albumin 3.4 (L) 3.5 - 5.0 g/dL   AST 22 15 - 41 U/L   ALT 37 17 - 63 U/L   Alkaline Phosphatase 92 38 - 126 U/L  Total Bilirubin 0.9 0.3 - 1.2 mg/dL   GFR calc non Af Amer >60 >60 mL/min   GFR calc Af Amer >60 >60 mL/min    Comment: (NOTE) The eGFR has been calculated using the CKD EPI equation. This calculation has not been validated in all clinical situations. eGFR's persistently <60 mL/min signify possible Chronic Kidney Disease.    Anion gap 11 5 - 15    Radiology Reports: Ct Abdomen Pelvis W Contrast  10/23/2014   CLINICAL DATA:  Abdominal pain for 1 week. Symptoms worsening over the last 2 days. Diarrhea. Constipation.  EXAM: CT ABDOMEN AND PELVIS WITH CONTRAST  TECHNIQUE: Multidetector CT imaging of the abdomen and pelvis was performed using the standard protocol following bolus administration of intravenous contrast.  CONTRAST:  121m OMNIPAQUE IOHEXOL 300 MG/ML  SOLN  COMPARISON:  Radiographs 10/21/2014.  FINDINGS: Musculoskeletal: No aggressive osseous lesions. Tiny benign bone island in the lumbar spine.  Lung Bases: Apparent 6 mm nodular density in the RIGHT lower lobe adjacent to the major fissure on axial image number 3 is artifactual. This is not seen on the coronal and sagittal reconstructions. There is respiratory motion artifact at this level.  Liver: Liver span is 22 cm, probably normal for body habitus. Nonspecific low-density lesion in the central RIGHT hepatic lobe measuring 16 mm. Statistically this probably represents either a cyst or benign hemangioma. Another low-density lesion is present measuring 3 cm near the porta hepatis. Although both of these are  nonspecific, the have benign imaging features and no further follow-up is recommended. This recommendation follows ACR consensus guidelines: Managing Incidental Findings on Abdominal CT: White Paper of the ACR Incidental Findings Committee. J Am Coll Radiol 2010;7:754-773  Spleen:  Normal.  Gallbladder:  Contracted.  No calcified stones.  Common bile duct:  Normal.  Pancreas:  Normal.  Adrenal glands:  Normal bilaterally.  Kidneys: Normal enhancement. Normal delayed excretion of contrast. LEFT ureter appears normal. RIGHT ureter appears normal.  Stomach:  Normal.  Small bowel:  Normal.  Colon: Normal appendix. Ascending colon appears normal. Transverse colon normal. Descending colon collapsed. Sigmoid diverticulosis is present. There is acute diverticulitis in a redundant loop of the sigmoid colon just to the RIGHT of midline in the anatomic pelvis. Contained perforation is present into the sigmoid mesocolon (image 76 series 2). No abscess.  Pelvic Genitourinary:  Normal.  Peritoneum: No free air.  No free fluid.  Vascular/lymphatic: Mesenteric phlegmon without adenopathy. 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  Body Wall: Moderate size fat containing periumbilical hernia. Fat containing LEFT inguinal hernia.  IMPRESSION: 1. Contained perforation of acute sigmoid diverticulitis. No abscess or intra-abdominal free air. 2. 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 3. Critical Value/emergent results were called by telephone at the time of interpretation on 10/23/2014 at 9:22 am to Dr. MSabra Heck who verbally acknowledged these results.   Electronically Signed   By: GDereck LigasM.D.   On: 10/23/2014 09:23     Problem List  Principal Problem:   Diverticulitis of colon with perforation Active Problems:   TOBACCO USER   Umbilical hernia   ADHD (attention deficit hyperactivity disorder)   OSA (obstructive sleep apnea)   Diet-controlled diabetes mellitus   Assessment: This is a 39year old Caucasian male with a past  medical history as stated earlier, who presents with abdominal pain ongoing for about 2 weeks. This got worse on Thursday. He has been constipated for these last few days. CT scan shows contained perforation of acute sigmoid diverticulitis.  Plan: #1 acute diverticulitis with  contained perforation: General surgery has been consulted and they would like for medicine to admit. We will place him on Cipro and Flagyl. Keep him nothing by mouth. IV fluids. Pain management. Check TSH due to mention of constipation.  #2 dehydration: Give IV fluids.  #3 Umbilical hernia. This is chronic for him. General surgery to address this further.  #4 Diet-controlled diabetes mellitus: He used to be on Invokana and metformin but hasn't taken this since December and, according to him the HbA1c is now below the diabetic range. We will check another HbA1c in the hospital. We will check his blood glucose levels periodically.  #5 history of obstructive sleep apnea: He does not tolerate CPAP. He's lost weight (which he has been trying to) and he says that he is sleeping better now. Continue to monitor.  #6 history of ADHD: Stable. Resume his medications when he is able to take orally.   DVT Prophylaxis: Lovenox adjusted for weight Code Status: Full code Family Communication: Discussed with the patient  Disposition Plan: Admit to MedSurg   Further management decisions will depend on results of further testing and patient's response to treatment.   [facility]  [facility] Pager [phone]  If 7PM-7AM, please contact night-coverage [email or web address] Password [email or web address]  10/23/2014, 10:07 AM

## 2014-10-23 NOTE — ED Provider Notes (Signed)
CSN: [id number]     Arrival date & time 10/23/14  0714 History   First MD Initiated Contact with Patient 10/23/14 [phone]     Chief Complaint  Patient presents with  . Abdominal Pain  . Constipation     (Consider location/radiation/quality/duration/timing/severity/associated sxs/prior Treatment) HPI Comments: Patient presents today with abdominal pain.  Pain has been for the past 4 days.  He states that the pain is generalized, but worse across the lower abdomen.  He states that the pain feels like a cramping pain, but at times becomes more sharp.  He thought that he was constipated so he took [place].  He states that the Milk of Mag caused some diarrhea, but did not help with the pain.  He states that the pain worsened two days ago, which caused him to go to Urgent Care.  At that time an abdominal xray was done, which did not show an acute abnormality.  No evidence of constipation.  However, he was instructed to take Miralax.  He states that he has been taking the Miralax without improvement.  He reports that his stool has been a green liquid.  He denies nausea or vomiting.  Denies fever or chills. Denies urinary symptoms.  Denies scrotal pain or swelling.  Denies prior abdominal surgeries.  He has not taken any pain medication.  Patient reports that he does not want any pain medication at this time.  Patient is a 39 y.o. male presenting with abdominal pain and constipation. The history is provided by the patient.  Abdominal Pain Associated symptoms: constipation   Constipation Associated symptoms: abdominal pain     Past Medical History  Diagnosis Date  . Gout   . Tobacco abuse   . ADHD (attention deficit hyperactivity disorder)     per Dr Candis Schatz  . Diabetes   . OSA (obstructive sleep apnea) dx 05-2013    Rx a Cpap 3-15, can't use    Past Surgical History  Procedure Laterality Date  . No past surgeries     Family History  Problem Relation Age of Onset  . Colon cancer  Neg Hx   . Prostate cancer Neg Hx   . CAD Other     MGF  . Stroke Father     F, PGF  . Diabetes Neg Hx     distant fam members  . Heart disease Maternal Grandfather    History  Substance Use Topics  . Smoking status: Former Smoker -- 1.00 packs/day for 20 years    Types: Cigarettes    Start date: 07/30/1992    Quit date: 04/26/2014  . Smokeless tobacco: Never Used     Comment: Nov. 2015  . Alcohol Use: 0.0 oz/week    0 Standard drinks or equivalent per week     Comment: rarely 1-2 monthly    Review of Systems  Gastrointestinal: Positive for abdominal pain and constipation.  All other systems reviewed and are negative.     Allergies  Review of patient's allergies indicates no known allergies.  Home Medications   Prior to Admission medications   Medication Sig Start Date End Date Taking? Authorizing Provider  lisdexamfetamine (VYVANSE) 50 MG capsule Take 50 mg by mouth daily. Per Dr Karl Luke    Historical Provider, MD  Multiple Vitamin (MULTIVITAMIN) tablet Take 1 tablet by mouth daily.    Historical Provider, MD  zaleplon (SONATA) 10 MG capsule Take 10 mg by mouth as needed.    Historical Provider, MD  BP 143/90 mmHg  Pulse 101  Resp 18  Ht 6' (1.829 m)  Wt 325 lb (147.419 kg)  BMI 44.07 kg/m2  SpO2 94% Physical Exam  Constitutional: He appears well-developed and well-nourished.  HENT:  Head: Normocephalic and atraumatic.  Mouth/Throat: Oropharynx is clear and moist.  Neck: Normal range of motion. Neck supple.  Cardiovascular: Normal rate, regular rhythm and normal heart sounds.   Pulmonary/Chest: Effort normal and breath sounds normal.  Abdominal: Soft. Bowel sounds are normal. There is generalized tenderness. There is no rebound and no guarding.    Genitourinary: Right testis shows no mass, no swelling and no tenderness. Right testis is descended. Left testis shows no mass, no swelling and no tenderness. Left testis is descended. Uncircumcised.    Musculoskeletal: Normal range of motion.  Neurological: He is alert.  Skin: Skin is warm and dry.  Psychiatric: He has a normal mood and affect.  Nursing note and vitals reviewed.   ED Course  Procedures (including critical care time) Labs Review Labs Reviewed  CBC WITH DIFFERENTIAL/PLATELET  COMPREHENSIVE METABOLIC PANEL    Imaging Review Ct Abdomen Pelvis W Contrast  10/23/2014   CLINICAL DATA:  Abdominal pain for 1 week. Symptoms worsening over the last 2 days. Diarrhea. Constipation.  EXAM: CT ABDOMEN AND PELVIS WITH CONTRAST  TECHNIQUE: Multidetector CT imaging of the abdomen and pelvis was performed using the standard protocol following bolus administration of intravenous contrast.  CONTRAST:  1110mL OMNIPAQUE IOHEXOL 300 MG/ML  SOLN  COMPARISON:  Radiographs 10/21/2014.  FINDINGS: Musculoskeletal: No aggressive osseous lesions. Tiny benign bone island in the lumbar spine.  Lung Bases: Apparent 6 mm nodular density in the RIGHT lower lobe adjacent to the major fissure on axial image number 3 is artifactual. This is not seen on the coronal and sagittal reconstructions. There is respiratory motion artifact at this level.  Liver: Liver span is 22 cm, probably normal for body habitus. Nonspecific low-density lesion in the central RIGHT hepatic lobe measuring 16 mm. Statistically this probably represents either a cyst or benign hemangioma. Another low-density lesion is present measuring 3 cm near the porta hepatis. Although both of these are nonspecific, the have benign imaging features and no further follow-up is recommended. This recommendation follows ACR consensus guidelines: Managing Incidental Findings on Abdominal CT: White Paper of the ACR Incidental Findings Committee. J Am Coll Radiol 2010;7:754-773  Spleen:  Normal.  Gallbladder:  Contracted.  No calcified stones.  Common bile duct:  Normal.  Pancreas:  Normal.  Adrenal glands:  Normal bilaterally.  Kidneys: Normal enhancement. Normal  delayed excretion of contrast. LEFT ureter appears normal. RIGHT ureter appears normal.  Stomach:  Normal.  Small bowel:  Normal.  Colon: Normal appendix. Ascending colon appears normal. Transverse colon normal. Descending colon collapsed. Sigmoid diverticulosis is present. There is acute diverticulitis in a redundant loop of the sigmoid colon just to the RIGHT of midline in the anatomic pelvis. Contained perforation is present into the sigmoid mesocolon (image 76 series 2). No abscess.  Pelvic Genitourinary:  Normal.  Peritoneum: No free air.  No free fluid.  Vascular/lymphatic: Mesenteric phlegmon without adenopathy. 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  Body Wall: Moderate size fat containing periumbilical hernia. Fat containing LEFT inguinal hernia.  IMPRESSION: 1. Contained perforation of acute sigmoid diverticulitis. No abscess or intra-abdominal free air. 2. 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 3. Critical Value/emergent results were called by telephone at the time of interpretation on 10/23/2014 at 9:22 am to Dr. Sabra Heck, who verbally acknowledged these results.  Electronically Signed   By: Dereck Ligas M.D.   On: 10/23/2014 09:23     EKG Interpretation None      9:30 AM Discussed CT results and plan with patient.  He continues to say that he does not want any medication for pain at this time.  9:38 AM Discussed with General Surgery.  They report that they will consult on the patient. 9:46 AM Discussed with Dr. [place] Pink with [facility] who has agreed to admit the patient.   MDM   Final diagnoses:  None   Patient presents today with a chief complaint of abdominal pain.  Pain is diffuse and has been present for the past 4 days.  He was seen at [facility] two days ago and had an xray, which did not show heavy stool burden.  He has been taking Miralax without improvement.  He is afebrile.  On exam, abdomen is soft with diffuse tenderness.  CT ab/pelvis today showing a contained perforated  Diverticulitis.  Patient started on IV Zosyn.  General surgery consulted.  Patient admitted to [facility] for further management.      Hyman Bible, PA-C 10/23/14 1000  Noemi Chapel, MD 10/23/14 [phone]

## 2014-10-23 NOTE — Consult Note (Signed)
Reason for Consult:ab pain Referring Physician: Dr Bonnielee Haff  Spencer Good is an 39 y.o. male.  HPI: 36 yom with history of add, diabetes, osa who for last three weeks has bilateral lq pain.  He was eating some and having bms (mostly loose he states).  Not passing as much flatus, No fevers.  He has known umbo hernia that is always out.  He tried to take mom and palpate hernia this week and had increased pain.  He came to er today to be evaluated. Was found on ct to have diverticulitis.  No n/v.  No prior csc.  Past Medical History  Diagnosis Date  . Gout   . Tobacco abuse   . ADHD (attention deficit hyperactivity disorder)     per Dr Candis Schatz  . Diabetes   . OSA (obstructive sleep apnea) dx 05-2013    Rx a Cpap 3-15, can't use     Past Surgical History  Procedure Laterality Date  . No past surgeries      Family History  Problem Relation Age of Onset  . Colon cancer Neg Hx   . Prostate cancer Neg Hx   . Diabetes Neg Hx     distant fam members  . CAD Other     MGF  . Stroke Father     F, PGF  . Heart disease Maternal Grandfather     Social History:  reports that he has been smoking Cigarettes.  He started smoking about 22 years ago. He has a 20 pack-year smoking history. He has never used smokeless tobacco. He reports that he drinks alcohol. He reports that he does not use illicit drugs.  Allergies: No Known Allergies  Medications: I have reviewed the patient's current medications.  Results for orders placed or performed during the hospital encounter of 10/23/14 (from the past 48 hour(s))  CBC with Differential     Status: Abnormal   Collection Time: 10/23/14  7:47 AM  Result Value Ref Range   WBC 12.0 (H) 4.0 - 10.5 K/uL   RBC 5.12 4.22 - 5.81 MIL/uL   Hemoglobin 14.1 13.0 - 17.0 g/dL   HCT 42.0 39.0 - 52.0 %   MCV 82.0 78.0 - 100.0 fL   MCH 27.5 26.0 - 34.0 pg   MCHC 33.6 30.0 - 36.0 g/dL   RDW 14.9 11.5 - 15.5 %   Platelets 192 150 - 400 K/uL    Neutrophils Relative % 66 43 - 77 %   Neutro Abs 7.9 (H) 1.7 - 7.7 K/uL   Lymphocytes Relative 25 12 - 46 %   Lymphs Abs 3.0 0.7 - 4.0 K/uL   Monocytes Relative 7 3 - 12 %   Monocytes Absolute 0.9 0.1 - 1.0 K/uL   Eosinophils Relative 2 0 - 5 %   Eosinophils Absolute 0.3 0.0 - 0.7 K/uL   Basophils Relative 0 0 - 1 %   Basophils Absolute 0.0 0.0 - 0.1 K/uL  Comprehensive metabolic panel     Status: Abnormal   Collection Time: 10/23/14  7:47 AM  Result Value Ref Range   Sodium 138 135 - 145 mmol/L   Potassium 3.8 3.5 - 5.1 mmol/L   Chloride 103 101 - 111 mmol/L   CO2 24 22 - 32 mmol/L   Glucose, Bld 134 (H) 70 - 99 mg/dL   BUN 9 6 - 20 mg/dL   Creatinine, Ser 0.89 0.61 - 1.24 mg/dL   Calcium 9.1 8.9 - 10.3 mg/dL   Total  Protein 7.0 6.5 - 8.1 g/dL   Albumin 3.4 (L) 3.5 - 5.0 g/dL   AST 22 15 - 41 U/L   ALT 37 17 - 63 U/L   Alkaline Phosphatase 92 38 - 126 U/L   Total Bilirubin 0.9 0.3 - 1.2 mg/dL   GFR calc non Af Amer >60 >60 mL/min   GFR calc Af Amer >60 >60 mL/min    Comment: (NOTE) The eGFR has been calculated using the CKD EPI equation. This calculation has not been validated in all clinical situations. eGFR's persistently <60 mL/min signify possible Chronic Kidney Disease.    Anion gap 11 5 - 15    Ct Abdomen Pelvis W Contrast  10/23/2014   CLINICAL DATA:  Abdominal pain for 1 week. Symptoms worsening over the last 2 days. Diarrhea. Constipation.  EXAM: CT ABDOMEN AND PELVIS WITH CONTRAST  TECHNIQUE: Multidetector CT imaging of the abdomen and pelvis was performed using the standard protocol following bolus administration of intravenous contrast.  CONTRAST:  174m OMNIPAQUE IOHEXOL 300 MG/ML  SOLN  COMPARISON:  Radiographs 10/21/2014.  FINDINGS: Musculoskeletal: No aggressive osseous lesions. Tiny benign bone island in the lumbar spine.  Lung Bases: Apparent 6 mm nodular density in the RIGHT lower lobe adjacent to the major fissure on axial image number 3 is artifactual. This  is not seen on the coronal and sagittal reconstructions. There is respiratory motion artifact at this level.  Liver: Liver span is 22 cm, probably normal for body habitus. Nonspecific low-density lesion in the central RIGHT hepatic lobe measuring 16 mm. Statistically this probably represents either a cyst or benign hemangioma. Another low-density lesion is present measuring 3 cm near the porta hepatis. Although both of these are nonspecific, the have benign imaging features and no further follow-up is recommended. This recommendation follows ACR consensus guidelines: Managing Incidental Findings on Abdominal CT: White Paper of the ACR Incidental Findings Committee. J Am Coll Radiol 2010;7:754-773  Spleen:  Normal.  Gallbladder:  Contracted.  No calcified stones.  Common bile duct:  Normal.  Pancreas:  Normal.  Adrenal glands:  Normal bilaterally.  Kidneys: Normal enhancement. Normal delayed excretion of contrast. LEFT ureter appears normal. RIGHT ureter appears normal.  Stomach:  Normal.  Small bowel:  Normal.  Colon: Normal appendix. Ascending colon appears normal. Transverse colon normal. Descending colon collapsed. Sigmoid diverticulosis is present. There is acute diverticulitis in a redundant loop of the sigmoid colon just to the RIGHT of midline in the anatomic pelvis. Contained perforation is present into the sigmoid mesocolon (image 76 series 2). No abscess.  Pelvic Genitourinary:  Normal.  Peritoneum: No free air.  No free fluid.  Vascular/lymphatic: Mesenteric phlegmon without adenopathy. 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  Body Wall: Moderate size fat containing periumbilical hernia. Fat containing LEFT inguinal hernia.  IMPRESSION: 1. Contained perforation of acute sigmoid diverticulitis. No abscess or intra-abdominal free air. 2. 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 3. Critical Value/emergent results were called by telephone at the time of interpretation on 10/23/2014 at 9:22 am to Dr. MSabra Heck who verbally  acknowledged these results.   Electronically Signed   By: GDereck LigasM.D.   On: 10/23/2014 09:23    Review of Systems  Constitutional: Negative for fever and chills.  Respiratory: Negative for cough and shortness of breath.   Cardiovascular: Negative for chest pain.  Gastrointestinal: Positive for abdominal pain and diarrhea. Negative for nausea, vomiting and blood in stool.  Genitourinary: Negative for dysuria, urgency and frequency.   Blood pressure 144/84, pulse 89,  temperature 98.5 F (36.9 C), temperature source Oral, resp. rate 18, height 6' (1.829 m), weight 147.419 kg (325 lb), SpO2 96 %. Physical Exam  Vitals reviewed. Constitutional: He appears well-developed and well-nourished.  Eyes: No scleral icterus.  Neck: Neck supple.  Cardiovascular: Normal rate, regular rhythm and normal heart sounds.   Respiratory: Effort normal and breath sounds normal. He has no wheezes. He has no rales.  GI: Soft. Bowel sounds are normal. He exhibits no distension. There is tenderness (mild bilateral lq). A hernia (chronically incarcerated umbo hernia) is present.    Assessment/Plan: Diverticulitis  Abx, npo and appears this will likely heal conservatively. Can discuss csc after this episode  Umbilical hernia  Would recommend elective repair once diverticulitis resolves  Cathalina Barcia 10/23/2014, 11:47 AM

## 2014-10-24 LAB — CBC
HCT: 40.6 % (ref 39.0–52.0)
Hemoglobin: 13.1 g/dL (ref 13.0–17.0)
MCH: 27.1 pg (ref 26.0–34.0)
MCHC: 32.3 g/dL (ref 30.0–36.0)
MCV: 83.9 fL (ref 78.0–100.0)
Platelets: 209 10*3/uL (ref 150–400)
RBC: 4.84 MIL/uL (ref 4.22–5.81)
RDW: 15.1 % (ref 11.5–15.5)
WBC: 9 10*3/uL (ref 4.0–10.5)

## 2014-10-24 LAB — TSH: TSH: 2.659 u[IU]/mL (ref 0.350–4.500)

## 2014-10-24 LAB — COMPREHENSIVE METABOLIC PANEL
ALT: 33 U/L (ref 17–63)
AST: 17 U/L (ref 15–41)
Albumin: 3.3 g/dL — ABNORMAL LOW (ref 3.5–5.0)
Alkaline Phosphatase: 81 U/L (ref 38–126)
Anion gap: 11 (ref 5–15)
BUN: 10 mg/dL (ref 6–20)
CO2: 24 mmol/L (ref 22–32)
Calcium: 8.9 mg/dL (ref 8.9–10.3)
Chloride: 104 mmol/L (ref 101–111)
Creatinine, Ser: 0.89 mg/dL (ref 0.61–1.24)
GFR calc Af Amer: >60 mL/min (ref >60)
GFR calc non Af Amer: >60 mL/min (ref >60)
Glucose, Bld: 105 mg/dL — ABNORMAL HIGH (ref 70–99)
Potassium: 4 mmol/L (ref 3.5–5.1)
Sodium: 139 mmol/L (ref 135–145)
Total Bilirubin: 1 mg/dL (ref 0.3–1.2)
Total Protein: 6.5 g/dL (ref 6.5–8.1)

## 2014-10-24 LAB — GLUCOSE, CAPILLARY
Glucose-Capillary: 109 mg/dL — ABNORMAL HIGH (ref 70–99)
Glucose-Capillary: 89 mg/dL (ref 70–99)

## 2014-10-24 NOTE — Progress Notes (Addendum)
[facility] PROGRESS NOTE  Spencer Good PZW:[id number] DOB: [birth year] DOA: 10/23/2014 PCP: Kathlene November, MD  Assessment/Plan: 1. Acute Diverticulitis with contained perforation -improving -continue IV Abx-Cipro/flagyl -start clears -appreciate CCS consult -needs interval colonoscopy  2. Chronic Umbilical hernia -needs non urgent repair  3. DM/diet controlled -FU Hbaic, controlled CBGs -also has age advanced atherosclerosis on CT-needs lifestyle modification, continued weight loss, monitor lipids  4. OSA  5. H/o ADHD -stable, vyvanse on hold now  DVT proph: lovenox  Code Status: Full COde Family Communication: none at bedside Disposition Plan: home tomorrow if stable   Consultants:  CCS  HPI/Subjective: abd pain continues to improve, NO N/V  Objective: Filed Vitals:   10/24/14 0616  BP: 119/90  Pulse: 91  Temp: 97.8 F (36.6 C)  Resp: 22    Intake/Output Summary (Last 24 hours) at 10/24/14 0950 Last data filed at 10/24/14 0650  Gross per 24 hour  Intake 2136.67 ml  Output    953 ml  Net 1183.67 ml   Filed Weights   10/23/14 0725 10/23/14 2133  Weight: 147.419 kg (325 lb) 143.382 kg (316 lb 1.6 oz)    Exam:   General:  AAOx3, no distress  Cardiovascular: S1S2/RRR  Respiratory: CTAB  Abdomen: soft, obese, NT, BS present, no peritoneal signs  Musculoskeletal: no edema c/c  Data Reviewed: Basic Metabolic Panel:  Recent Labs Lab 10/23/14 0747 10/24/14 0535  NA 138 139  K 3.8 4.0  CL 103 104  CO2 24 24  GLUCOSE 134* 105*  BUN 9 10  CREATININE 0.89 0.89  CALCIUM 9.1 8.9   Liver Function Tests:  Recent Labs Lab 10/23/14 0747 10/24/14 0535  AST 22 17  ALT 37 33  ALKPHOS 92 81  BILITOT 0.9 1.0  PROT 7.0 6.5  ALBUMIN 3.4* 3.3*   No results for input(s): LIPASE, AMYLASE in the last 168 hours. No results for input(s): AMMONIA in the last 168 hours. CBC:  Recent Labs Lab 10/23/14 0747 10/24/14 0535  WBC 12.0* 9.0   NEUTROABS 7.9*  --   HGB 14.1 13.1  HCT 42.0 40.6  MCV 82.0 83.9  PLT 192 209   Cardiac Enzymes: No results for input(s): CKTOTAL, CKMB, CKMBINDEX, TROPONINI in the last 168 hours. BNP (last 3 results) No results for input(s): BNP in the last 8760 hours.  ProBNP (last 3 results) No results for input(s): PROBNP in the last 8760 hours.  CBG:  Recent Labs Lab 10/23/14 1147 10/23/14 1623 10/23/14 2127 10/24/14 0822  GLUCAP 113* 96 93 109*    No results found for this or any previous visit (from the past 240 hour(s)).   Studies: Ct Abdomen Pelvis W Contrast  10/23/2014   CLINICAL DATA:  Abdominal pain for 1 week. Symptoms worsening over the last 2 days. Diarrhea. Constipation.  EXAM: CT ABDOMEN AND PELVIS WITH CONTRAST  TECHNIQUE: Multidetector CT imaging of the abdomen and pelvis was performed using the standard protocol following bolus administration of intravenous contrast.  CONTRAST:  140mL OMNIPAQUE IOHEXOL 300 MG/ML  SOLN  COMPARISON:  Radiographs 10/21/2014.  FINDINGS: Musculoskeletal: No aggressive osseous lesions. Tiny benign bone island in the lumbar spine.  Lung Bases: Apparent 6 mm nodular density in the RIGHT lower lobe adjacent to the major fissure on axial image number 3 is artifactual. This is not seen on the coronal and sagittal reconstructions. There is respiratory motion artifact at this level.  Liver: Liver span is 22 cm, probably normal for body habitus. Nonspecific low-density lesion  in the central RIGHT hepatic lobe measuring 16 mm. Statistically this probably represents either a cyst or benign hemangioma. Another low-density lesion is present measuring 3 cm near the porta hepatis. Although both of these are nonspecific, the have benign imaging features and no further follow-up is recommended. This recommendation follows ACR consensus guidelines: Managing Incidental Findings on Abdominal CT: White Paper of the ACR Incidental Findings Committee. J Am Coll Radiol  2010;7:754-773  Spleen:  Normal.  Gallbladder:  Contracted.  No calcified stones.  Common bile duct:  Normal.  Pancreas:  Normal.  Adrenal glands:  Normal bilaterally.  Kidneys: Normal enhancement. Normal delayed excretion of contrast. LEFT ureter appears normal. RIGHT ureter appears normal.  Stomach:  Normal.  Small bowel:  Normal.  Colon: Normal appendix. Ascending colon appears normal. Transverse colon normal. Descending colon collapsed. Sigmoid diverticulosis is present. There is acute diverticulitis in a redundant loop of the sigmoid colon just to the RIGHT of midline in the anatomic pelvis. Contained perforation is present into the sigmoid mesocolon (image 76 series 2). No abscess.  Pelvic Genitourinary:  Normal.  Peritoneum: No free air.  No free fluid.  Vascular/lymphatic: Mesenteric phlegmon without adenopathy. 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  Body Wall: Moderate size fat containing periumbilical hernia. Fat containing LEFT inguinal hernia.  IMPRESSION: 1. Contained perforation of acute sigmoid diverticulitis. No abscess or intra-abdominal free air. 2. 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 3. Critical Value/emergent results were called by telephone at the time of interpretation on 10/23/2014 at 9:22 am to Dr. Sabra Heck, who verbally acknowledged these results.   Electronically Signed   By: Dereck Ligas M.D.   On: 10/23/2014 09:23    Scheduled Meds: . ciprofloxacin  400 mg Intravenous Q12H  . enoxaparin (LOVENOX) injection  70 mg Subcutaneous Q24H  . metronidazole  500 mg Intravenous Q8H   Continuous Infusions: . sodium chloride 75 mL/hr at 10/24/14 0940   Antibiotics Given (last 72 hours)    Date/Time Action Medication Dose Rate   10/23/14 1521 Given   metroNIDAZOLE (FLAGYL) IVPB 500 mg 500 mg 100 mL/hr   10/23/14 1723 Given   ciprofloxacin (CIPRO) IVPB 400 mg 400 mg 200 mL/hr   10/23/14 2224 Given   metroNIDAZOLE (FLAGYL) IVPB 500 mg 500 mg 100 mL/hr   10/24/14 0255 Given    ciprofloxacin (CIPRO) IVPB 400 mg 400 mg 200 mL/hr   10/24/14 1610 Given   metroNIDAZOLE (FLAGYL) IVPB 500 mg 500 mg 100 mL/hr      Principal Problem:   Diverticulitis of colon with perforation Active Problems:   TOBACCO USER   Umbilical hernia   ADHD (attention deficit hyperactivity disorder)   OSA (obstructive sleep apnea)   Diet-controlled diabetes mellitus    Time spent: 16min    Lailanie Hasley  [facility] Pager [phone]. If 7PM-7AM, please contact night-coverage at [email or web address], password [facility] 10/24/2014, 9:50 AM  LOS: 1 day

## 2014-10-24 NOTE — Plan of Care (Signed)
Problem: Food- and Nutrition-Related Knowledge Deficit (NB-1.1) Goal: Nutrition education Formal process to instruct or train a patient/client in a skill or to impart knowledge to help patients/clients voluntarily manage or modify food choices and eating behavior to maintain or improve health.  Outcome: Completed/Met Date Met:  10/24/14 RD consulted for a diet education regarding diverticulitis.  Pt was given " Low Fiber Nutrition Therapy" from the Academy of Nutrition and Dietetics manual. A list of foods recommended and not recommended were reviewed and discussed. Of note pt is following the Atkins diet. Pt's primary MD is aware. Pt was recommended 6-10 grams of fiber per day and to increase to standard recommendations of 20-35 grams a day. Teach back method used. RD contact information given.   Body mass index is 42.86 kg/(m^2). Morbid obesity. Pt has been intentionally losing weight.   Pt's diet order is a full liquid diet with 100% meal completion. Labs and medications reviewed. No further nutrition interventions at this time. If nutritional issues arise, re-consult RD.  Kallie Locks, MS, RD, LDN Pager # [phone] After hours/ weekend pager # [phone]

## 2014-10-24 NOTE — Progress Notes (Signed)
Patient ID: Spencer Good, male   DOB: 10-24-1975, 39 y.o.   MRN: [id number]   LOS: 1 day   Subjective: Feels better, pain improved, denies N/V.   Objective: Vital signs in last 24 hours: Temp:  [97.8 F (36.6 C)-98.5 F (36.9 C)] 97.8 F (36.6 C) (05/09 0616) Pulse Rate:  [85-91] 91 (05/09 0616) Resp:  [18-22] 22 (05/09 0616) BP: (119-152)/(79-90) 119/90 mmHg (05/09 0616) SpO2:  [94 %-97 %] 97 % (05/09 0616) Weight:  [143.382 kg (316 lb 1.6 oz)] 143.382 kg (316 lb 1.6 oz) (05/08 2133) Last BM Date: 10/23/14   Laboratory  CBC  Recent Labs  10/23/14 0747 10/24/14 0535  WBC 12.0* 9.0  HGB 14.1 13.1  HCT 42.0 40.6  PLT 192 209   BMET  Recent Labs  10/23/14 0747 10/24/14 0535  NA 138 139  K 3.8 4.0  CL 103 104  CO2 24 24  GLUCOSE 134* 105*  BUN 9 10  CREATININE 0.89 0.89  CALCIUM 9.1 8.9    Physical Exam General appearance: alert and no distress Resp: clear to auscultation bilaterally Cardio: regular rate and rhythm GI: Soft, +BS, mild diffuse TTP   Assessment/Plan: Diverticulitis -- Will give clears, continue abx. Afebrile and WBC returned to normal. Umbilical hernia  -- Elective repair as OP    Lisette Abu, PA-C Pager: [phone] 10/24/2014

## 2014-10-25 LAB — CBC
HCT: 39.9 % (ref 39.0–52.0)
Hemoglobin: 13 g/dL (ref 13.0–17.0)
MCH: 27 pg (ref 26.0–34.0)
MCHC: 32.6 g/dL (ref 30.0–36.0)
MCV: 82.8 fL (ref 78.0–100.0)
Platelets: 219 10*3/uL (ref 150–400)
RBC: 4.82 MIL/uL (ref 4.22–5.81)
RDW: 14.7 % (ref 11.5–15.5)
WBC: 9.5 10*3/uL (ref 4.0–10.5)

## 2014-10-25 LAB — GLUCOSE, CAPILLARY: Glucose-Capillary: 94 mg/dL (ref 70–99)

## 2014-10-25 LAB — HEMOGLOBIN A1C
Hgb A1c MFr Bld: 6.2 % — ABNORMAL HIGH (ref 4.8–5.6)
Mean Plasma Glucose: 131 mg/dL

## 2014-10-25 MED ORDER — CIPROFLOXACIN HCL 500 MG PO TABS: 500.0000 mg | ORAL_TABLET | Freq: Two times a day (BID) | ORAL | Status: DC

## 2014-10-25 MED ORDER — METRONIDAZOLE 500 MG PO TABS: 500.0000 mg | ORAL_TABLET | Freq: Three times a day (TID) | ORAL | Status: DC

## 2014-10-25 NOTE — Care Management Note (Signed)
Case Management Note  Patient Details  Name: Spencer Good MRN: [id number] Date of Birth: August 05, 1975  Subjective/Objective:                    Action/Plan:   Expected Discharge Date:  10/25/2014           Expected Discharge Plan:  Home/Self Care  In-House Referral:  NA  Discharge planning Services  NA  Post Acute Care Choice:  NA Choice offered to:  NA  DME Arranged:    DME Agency:     HH Arranged:    [place] Agency:     Status of Service:     Medicare Important Message Given:  No Date Medicare IM Given:    Medicare IM give by:    Date Additional Medicare IM Given:    Additional Medicare Important Message give by:     If discussed at [place] of Stay Meetings, dates discussed:    Additional Comments:  Adron Bene, RN 10/25/2014, 2:44 PM

## 2014-10-25 NOTE — Progress Notes (Signed)
Patient ID: Spencer Good, male   DOB: Oct 17, 1975, 39 y.o.   MRN: [id number]    Subjective: Pt doing well.  Tolerated solid diet for breakfast  Objective: Vital signs in last 24 hours: Temp:  [98 F (36.7 C)-98.8 F (37.1 C)] 98 F (36.7 C) (05/10 0525) Pulse Rate:  [82-87] 85 (05/10 0525) Resp:  [19-20] 20 (05/10 0525) BP: (135-140)/(74-101) 140/78 mmHg (05/10 0525) SpO2:  [97 %-98 %] 97 % (05/10 0525) Weight:  [144.22 kg (317 lb 15.2 oz)] 144.22 kg (317 lb 15.2 oz) (05/09 2045) Last BM Date: 10/24/14  Intake/Output from previous day: 05/09 0701 - 05/10 0700 In: 1600 [P.O.:1200; IV Piggyback:400] Out: 3175 [Urine:3175] Intake/Output this shift:    PE: Abd: soft, Nt, Nd, +BS  Lab Results:   Recent Labs  10/24/14 0535 10/25/14 0553  WBC 9.0 9.5  HGB 13.1 13.0  HCT 40.6 39.9  PLT 209 219   BMET  Recent Labs  10/23/14 0747 10/24/14 0535  NA 138 139  K 3.8 4.0  CL 103 104  CO2 24 24  GLUCOSE 134* 105*  BUN 9 10  CREATININE 0.89 0.89  CALCIUM 9.1 8.9   PT/INR No results for input(s): LABPROT, INR in the last 72 hours. CMP     Component Value Date/Time   NA 139 10/24/2014 0535   K 4.0 10/24/2014 0535   CL 104 10/24/2014 0535   CO2 24 10/24/2014 0535   GLUCOSE 105* 10/24/2014 0535   BUN 10 10/24/2014 0535   CREATININE 0.89 10/24/2014 0535   CALCIUM 8.9 10/24/2014 0535   PROT 6.5 10/24/2014 0535   ALBUMIN 3.3* 10/24/2014 0535   AST 17 10/24/2014 0535   ALT 33 10/24/2014 0535   ALKPHOS 81 10/24/2014 0535   BILITOT 1.0 10/24/2014 0535   GFRNONAA >60 10/24/2014 0535   GFRAA >60 10/24/2014 0535   Lipase  No results found for: LIPASE     Studies/Results: No results found.  Anti-infectives: Anti-infectives    Start     Dose/Rate Route Frequency Ordered Stop   10/25/14 0000  ciprofloxacin (CIPRO) 500 MG tablet     500 mg Oral 2 times daily 10/25/14 0946     10/25/14 0000  metroNIDAZOLE (FLAGYL) 500 MG tablet     500 mg Oral 3 times daily  10/25/14 0946     10/23/14 1400  ciprofloxacin (CIPRO) IVPB 400 mg     400 mg 200 mL/hr over 60 Minutes Intravenous Every 12 hours 10/23/14 1309     10/23/14 1400  metroNIDAZOLE (FLAGYL) IVPB 500 mg     500 mg 100 mL/hr over 60 Minutes Intravenous Every 8 hours 10/23/14 1309     10/23/14 0945  piperacillin-tazobactam (ZOSYN) IVPB 3.375 g  Status:  Discontinued     3.375 g 12.5 mL/hr over 240 Minutes Intravenous  Once 10/23/14 0935 10/23/14 1309       Assessment/Plan   Diverticulitis -- tolerating a solid diet and minimal pain.  Ok from surgery standpoint for dc home. -cont abx therapy for a total of 7 days Umbilical hernia -- Elective repair as OP, may follow up with Dr. Donne Hazel prn   LOS: 2 days    Shruthi Northrup E 10/25/2014, 10:18 AM Pager: [phone]

## 2014-10-25 NOTE — Progress Notes (Signed)
Discharged instruction and d/c paper given to patient.Instructed to call MD for follow up.Instructed to pick up prescription for antibiotic at his [facility] pharmacy.Patient verbalized understanding. Arrietty Dercole, Wonda Cheng, Therapist, sports

## 2014-10-25 NOTE — Discharge Summary (Signed)
Physician Discharge Summary  Spencer Good JAS:[id number] DOB: 1976-03-29 DOA: 10/23/2014  PCP: Kathlene November, MD  Admit date: 10/23/2014 Discharge date: 10/25/2014  Time spent:45 minutes  Recommendations for Outpatient Follow-up:  1. PCP in 1 week, needs referral to GI for colonscopy 2. Dr.Wakefield for elective Umbilical hernia repair  Discharge Diagnoses:  Principal Problem:   Diverticulitis of colon with perforation Active Problems:   TOBACCO USER   Umbilical hernia   ADHD (attention deficit hyperactivity disorder)   OSA (obstructive sleep apnea)   Diet-controlled diabetes mellitus   Discharge Condition: stable  Diet recommendation: bland diet advance as tolerated  Filed Weights   10/23/14 0725 10/23/14 2133 10/24/14 2045  Weight: 147.419 kg (325 lb) 143.382 kg (316 lb 1.6 oz) 144.22 kg (317 lb 15.2 oz)    History of present illness:  Chief Complaint: Abdominal pain x 2days HPI: Spencer Good is a 39 y.o. male with a past medical history of diet-controlled diabetes, obstructive sleep apnea intolerant of CPAP, ADHD, who was in his usual state of health till about 2 weeks ago when he started noticing some discomfort in his lower abdomen and in the sides, then progressively worsened and presented to the ER  Hospital Course:  1. Acute Diverticulitis with contained perforation-noted on CT -improved with IV Abx, conservative management -Also seen by [facility] Dr.Wakefield in consultation -Afebrile without leukocytosis and tolerating diet with significant improvement in symptoms at the time of discharge. -Needs GI follow Up and interval colonoscopy -discharged home on PO Cipro/Flagyl to complete course  2. Chronic Umbilical hernia -needs non urgent repair -advised to FU with [facility]  3. DM/diet controlled -Hbaic 6.2 -also has age advanced atherosclerosis on CT-needs lifestyle modification, continued weight loss, monitor lipids-per PCP  4. OSA  5. H/o ADHD -stable, vyvanse resumed  at DC   Consultations:  [facility] Dr.Wakefield  Discharge Exam: Filed Vitals:   10/25/14 0915  BP: 136/76  Pulse: 90  Temp: 98.5 F (36.9 C)  Resp: 20    General:AAOx3 Cardiovascular: S1S2/RRR Respiratory: CTAB  Discharge Instructions   Discharge Instructions    Discharge instructions    Complete by:  As directed   Soft, bland diet, advance as tolerated     Increase activity slowly    Complete by:  As directed           Discharge Medication List as of 10/25/2014 10:57 AM    START taking these medications   Details  ciprofloxacin (CIPRO) 500 MG tablet Take 1 tablet (500 mg total) by mouth 2 (two) times daily. For 8days, Starting 10/25/2014, Until Discontinued, Normal    metroNIDAZOLE (FLAGYL) 500 MG tablet Take 1 tablet (500 mg total) by mouth 3 (three) times daily. For 8days, Starting 10/25/2014, Until Discontinued, Normal      CONTINUE these medications which have NOT CHANGED   Details  lisdexamfetamine (VYVANSE) 50 MG capsule Take 50 mg by mouth daily. Per Dr Karl Luke, Until Discontinued, Historical Med    Multiple Vitamin (MULTIVITAMIN) tablet Take 1 tablet by mouth daily., Until Discontinued, Historical Med    zaleplon (SONATA) 10 MG capsule Take 10 mg by mouth as needed., Until Discontinued, Historical Med       No Known Allergies Follow-up Information    Follow up with Kathlene November, MD. Schedule an appointment as soon as possible for a visit in 1 week.   Specialty:  Internal Medicine   Why:  also need referral to gastroenterologist for Colonoscopy   Contact information:   [address] [place] [address] [phone]       Follow up with Rolm Bookbinder, MD.   Specialty:  General Surgery   Why:  As needed, for umbilical hernia   Contact information:   [address] [place] [address] [phone]        The results of significant diagnostics from this hospitalization (including imaging, microbiology, ancillary and  laboratory) are listed below for reference.    Significant Diagnostic Studies: Ct Abdomen Pelvis W Contrast  10/23/2014   CLINICAL DATA:  Abdominal pain for 1 week. Symptoms worsening over the last 2 days. Diarrhea. Constipation.  EXAM: CT ABDOMEN AND PELVIS WITH CONTRAST  TECHNIQUE: Multidetector CT imaging of the abdomen and pelvis was performed using the standard protocol following bolus administration of intravenous contrast.  CONTRAST:  161mL OMNIPAQUE IOHEXOL 300 MG/ML  SOLN  COMPARISON:  Radiographs 10/21/2014.  FINDINGS: Musculoskeletal: No aggressive osseous lesions. Tiny benign bone island in the lumbar spine.  Lung Bases: Apparent 6 mm nodular density in the RIGHT lower lobe adjacent to the major fissure on axial image number 3 is artifactual. This is not seen on the coronal and sagittal reconstructions. There is respiratory motion artifact at this level.  Liver: Liver span is 22 cm, probably normal for body habitus. Nonspecific low-density lesion in the central RIGHT hepatic lobe measuring 16 mm. Statistically this probably represents either a cyst or benign hemangioma. Another low-density lesion is present measuring 3 cm near the porta hepatis. Although both of these are nonspecific, the have benign imaging features and no further follow-up is recommended. This recommendation follows ACR consensus guidelines: Managing Incidental Findings on Abdominal CT: White Paper of the ACR Incidental Findings Committee. J Am Coll Radiol 2010;7:754-773  Spleen:  Normal.  Gallbladder:  Contracted.  No calcified stones.  Common bile duct:  Normal.  Pancreas:  Normal.  Adrenal glands:  Normal bilaterally.  Kidneys: Normal enhancement. Normal delayed excretion of contrast. LEFT ureter appears normal. RIGHT ureter appears normal.  Stomach:  Normal.  Small bowel:  Normal.  Colon: Normal appendix. Ascending colon appears normal. Transverse colon normal. Descending colon collapsed. Sigmoid diverticulosis is present. There  is acute diverticulitis in a redundant loop of the sigmoid colon just to the RIGHT of midline in the anatomic pelvis. Contained perforation is present into the sigmoid mesocolon (image 76 series 2). No abscess.  Pelvic Genitourinary:  Normal.  Peritoneum: No free air.  No free fluid.  Vascular/lymphatic: Mesenteric phlegmon without adenopathy. 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  Body Wall: Moderate size fat containing periumbilical hernia. Fat containing LEFT inguinal hernia.  IMPRESSION: 1. Contained perforation of acute sigmoid diverticulitis. No abscess or intra-abdominal free air. 2. 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 3. Critical Value/emergent results were called by telephone at the time of interpretation on 10/23/2014 at 9:22 am to Dr. Sabra Heck, who verbally acknowledged these results.   Electronically Signed   By: Dereck Ligas M.D.   On: 10/23/2014 09:23   Dg Abd 2 Views  10/21/2014   CLINICAL DATA:  39 year old male with a history of stomach pain for 1 week  EXAM: ABDOMEN - 2 VIEW  COMPARISON:  None.  FINDINGS: Gas within central small bowel and colon. No abnormally distended small bowel loops or colonic loops. Small amount of gas within the stomach, which is not distended.  No abnormal air-fluid levels.  No unexpected radiopaque foreign bodies. No unexpected calcifications.  Visualized silhouette of the liver, spleen, bilateral kidneys unremarkable.  No displaced  fracture.  IMPRESSION: Plain film is negative for acute abnormality.  .   Electronically Signed   By: Corrie Mckusick D.O.   On: 10/21/2014 09:32    Microbiology: No results found for this or any previous visit (from the past 240 hour(s)).   Labs: Basic Metabolic Panel:  Recent Labs Lab 10/23/14 0747 10/24/14 0535  NA 138 139  K 3.8 4.0  CL 103 104  CO2 24 24  GLUCOSE 134* 105*  BUN 9 10  CREATININE 0.89 0.89  CALCIUM 9.1 8.9   Liver Function Tests:  Recent Labs Lab 10/23/14 0747 10/24/14 0535  AST 22 17  ALT 37 33   ALKPHOS 92 81  BILITOT 0.9 1.0  PROT 7.0 6.5  ALBUMIN 3.4* 3.3*   No results for input(s): LIPASE, AMYLASE in the last 168 hours. No results for input(s): AMMONIA in the last 168 hours. CBC:  Recent Labs Lab 10/23/14 0747 10/24/14 0535 10/25/14 0553  WBC 12.0* 9.0 9.5  NEUTROABS 7.9*  --   --   HGB 14.1 13.1 13.0  HCT 42.0 40.6 39.9  MCV 82.0 83.9 82.8  PLT 192 209 219   Cardiac Enzymes: No results for input(s): CKTOTAL, CKMB, CKMBINDEX, TROPONINI in the last 168 hours. BNP: BNP (last 3 results) No results for input(s): BNP in the last 8760 hours.  ProBNP (last 3 results) No results for input(s): PROBNP in the last 8760 hours.  CBG:  Recent Labs Lab 10/23/14 1623 10/23/14 2127 10/24/14 0822 10/24/14 1644 10/25/14 0001  GLUCAP 96 93 109* 89 94       Signed:  Brittny Spangle  [facility] 10/25/2014, 3:28 PM

## 2014-10-31 ENCOUNTER — Encounter: Payer: Self-pay | Admitting: Internal Medicine

## 2014-10-31 ENCOUNTER — Ambulatory Visit (INDEPENDENT_AMBULATORY_CARE_PROVIDER_SITE_OTHER): Payer: BLUE CROSS/BLUE SHIELD | Admitting: Internal Medicine

## 2014-10-31 VITALS — BP 132/84 | HR 98 | Temp 97.8°F | Ht 69.0 in | Wt 322.0 lb

## 2014-10-31 DIAGNOSIS — E119 Type 2 diabetes mellitus without complications: Secondary | ICD-10-CM | POA: Diagnosis not present

## 2014-10-31 DIAGNOSIS — K429 Umbilical hernia without obstruction or gangrene: Secondary | ICD-10-CM | POA: Diagnosis not present

## 2014-10-31 DIAGNOSIS — K5732 Diverticulitis of large intestine without perforation or abscess without bleeding: Secondary | ICD-10-CM | POA: Diagnosis not present

## 2014-10-31 NOTE — Progress Notes (Signed)
Subjective:    Patient ID: Spencer Good, male    DOB: 09-20-1975, 39 y.o.   MRN: [id number]  DOS:  10/31/2014 Type of visit - description : Hospital follow-up Interval history:  Admitted 10/23/2014, stay for 2 days. Diagnosis was diverticulitis with colon perforation, he was treated conservatively. He improved with IV antibiotics, surgery was consulted. Was recommended to see GI on follow-up. Discharge in Cipro and Flagyl. Also has a chronic umbilical hernia, needs a non-urgent repair.  Labs reviewed.. Kidney function stable,no anemia, CT scan IMPRESSION: 1. Contained perforation of acute sigmoid diverticulitis. No abscess or intra-abdominal free air. 2. Age advanced atherosclerosis @ the ileofemoral area  Liver: Liver span is 22 cm, probably normal for body habitus. Nonspecific low-density lesion in the central RIGHT hepatic lobe measuring 16 mm. Statistically this probably represents either a cyst or benign hemangioma. Another low-density lesion is present measuring 3 cm near the porta hepatis. Although both of these are nonspecific, the have benign imaging features and no further follow-up is recommended.      Review of Systems Since he left the hospital he is feeling better. No fever chills, appetite is decreased. No nausea, vomiting, he is having around 2 BMs daily, stools are loose/watery. No blood or mucus in the stools. No dysuria, gross hematuria difficulty urinating. Good compliance with antibiotics  Past Medical History  Diagnosis Date  . Gout   . Tobacco abuse   . ADHD (attention deficit hyperactivity disorder)     per Dr Candis Schatz  . Diabetes   . OSA (obstructive sleep apnea) dx 05-2013    Rx a Cpap 3-15, can't use     Past Surgical History  Procedure Laterality Date  . No past surgeries      History   Social History  . Marital Status: Single    Spouse Name: N/A  . Number of Children: 0  . Years of Education: N/A   Occupational History    . 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 Smoking status: Current Some Day Smoker -- 1.00 packs/day for 20 years    Types: Cigarettes    Start date: 07/30/1992    Last Attempt to Quit: 04/26/2014  . Smokeless tobacco: Never Used     Comment: Nov. 2015  . Alcohol Use: 0.0 oz/week    0 Standard drinks or equivalent per week     Comment: rarely 1-2 monthly  . Drug Use: No  . Sexual Activity: Not Currently   Other Topics Concern  . Not on file   Social History Narrative        Medication List       This list is accurate as of: 10/31/14 11:48 AM.  Always use your most recent med list.               ciprofloxacin 500 MG tablet  Commonly known as:  CIPRO  Take 1 tablet (500 mg total) by mouth 2 (two) times daily. For 8days     metroNIDAZOLE 500 MG tablet  Commonly known as:  FLAGYL  Take 1 tablet (500 mg total) by mouth 3 (three) times daily. For 8days     multivitamin tablet  Take 1 tablet by mouth daily.     VYVANSE 50 MG capsule  Generic drug:  lisdexamfetamine  Take 50 mg by mouth daily. Per Dr Karl Luke     zaleplon 10 MG capsule  Commonly known as:  SONATA  Take 10 mg  by mouth as needed.           Objective:   Physical Exam BP 132/84 mmHg  Pulse 98  Temp(Src) 97.8 F (36.6 C) (Oral)  Ht 5\' 9"  (1.753 m)  Wt 322 lb (146.058 kg)  BMI 47.53 kg/m2  SpO2 98%  General:   Well developed, well nourished . NAD.  HEENT:  Normocephalic . Face symmetric, atraumatic Lungs:  CTA B Normal respiratory effort, no intercostal retractions, no accessory muscle use. Heart: RRR,  no murmur.  no pretibial edema bilaterally  Abdomen:  Not distended, soft, slightly tender at the lower abdomen bilaterally without rebound or rigidity. No mass,organomegaly, reducible umbilical hernia. Skin: Not pale. Not jaundice Neurologic:  alert & oriented X3.  Speech normal, gait appropriate for age and unassisted Psych--  Cognition and judgment appear  intact.  Cooperative with normal attention span and concentration.  Behavior appropriate. No anxious or depressed appearing.      Assessment & Plan:    Diverticulitis, Admitted with the first episode of diverticulitis this month. Recovering well, good compliance with antibiotics. Having diarrhea without mucus or blood. Extensive discussion about his diet and what diverticulitis is. Plan: Finished antibiotics, refer to GI, gradually moved from a low fiber diet to a high-fiber diet. Call if few days if the diarrhea is not better, will consider stool studies for C. difficile etc.  Abnormal CT abdomen 10-2014 Shows extensive atherosclerosis, patient aware of the results, plan is to control cardiovascular risk factors, also, abnormal liver imaging but no further imaging is recommended.  Diabetes, No change, follow up in September.  Umbilical hernia Currently asymptomatic, plans to call surgery at some point this year for elective repair

## 2014-10-31 NOTE — Progress Notes (Signed)
Pre visit review using our clinic review tool, if applicable. No additional management support is needed unless otherwise documented below in the visit note. 

## 2014-10-31 NOTE — Patient Instructions (Signed)
Continue with your current diet and gradually go back to a more fiber rich-Atkins  diet.  Call if the diarrhea is not gradually resolving in the next 10 days  Okay to take a probiotic OTCs such as align  Next visit in 3 months

## 2014-11-02 ENCOUNTER — Encounter: Payer: Self-pay | Admitting: Physician Assistant

## 2014-11-15 ENCOUNTER — Other Ambulatory Visit: Payer: Self-pay | Admitting: Internal Medicine

## 2014-11-17 ENCOUNTER — Encounter: Payer: Self-pay | Admitting: Physician Assistant

## 2014-11-17 ENCOUNTER — Ambulatory Visit (INDEPENDENT_AMBULATORY_CARE_PROVIDER_SITE_OTHER)
Admission: RE | Admit: 2014-11-17 | Discharge: 2014-11-17 | Disposition: A | Discharge status: Home / Self Care | Payer: BLUE CROSS/BLUE SHIELD | Source: Ambulatory Visit | Attending: Physician Assistant | Admitting: Physician Assistant

## 2014-11-17 ENCOUNTER — Telehealth: Payer: Self-pay

## 2014-11-17 ENCOUNTER — Other Ambulatory Visit: Payer: Self-pay

## 2014-11-17 ENCOUNTER — Other Ambulatory Visit (INDEPENDENT_AMBULATORY_CARE_PROVIDER_SITE_OTHER): Payer: BLUE CROSS/BLUE SHIELD

## 2014-11-17 ENCOUNTER — Ambulatory Visit (INDEPENDENT_AMBULATORY_CARE_PROVIDER_SITE_OTHER): Payer: BLUE CROSS/BLUE SHIELD | Admitting: Physician Assistant

## 2014-11-17 VITALS — BP 138/86 | HR 88 | Ht 71.25 in | Wt 326.0 lb

## 2014-11-17 DIAGNOSIS — K5732 Diverticulitis of large intestine without perforation or abscess without bleeding: Secondary | ICD-10-CM | POA: Diagnosis not present

## 2014-11-17 DIAGNOSIS — R1032 Left lower quadrant pain: Secondary | ICD-10-CM

## 2014-11-17 LAB — CBC WITH DIFFERENTIAL/PLATELET
Basophils Absolute: 0.1 10*3/uL (ref 0.0–0.1)
Basophils Relative: 0.6 % (ref 0.0–3.0)
Eosinophils Absolute: 0.3 10*3/uL (ref 0.0–0.7)
Eosinophils Relative: 2.5 % (ref 0.0–5.0)
HCT: 43.6 % (ref 39.0–52.0)
Hemoglobin: 14.5 g/dL (ref 13.0–17.0)
Lymphocytes Relative: 35.1 % (ref 12.0–46.0)
Lymphs Abs: 3.8 10*3/uL (ref 0.7–4.0)
MCHC: 33.3 g/dL (ref 30.0–36.0)
MCV: 81.6 fl (ref 78.0–100.0)
Monocytes Absolute: 0.7 10*3/uL (ref 0.1–1.0)
Monocytes Relative: 6.7 % (ref 3.0–12.0)
Neutro Abs: 6 10*3/uL (ref 1.4–7.7)
Neutrophils Relative %: 55.1 % (ref 43.0–77.0)
Platelets: 278 10*3/uL (ref 150.0–400.0)
RBC: 5.35 Mil/uL (ref 4.22–5.81)
RDW: 14.7 % (ref 11.5–15.5)
WBC: 10.9 10*3/uL — ABNORMAL HIGH (ref 4.0–10.5)

## 2014-11-17 LAB — BASIC METABOLIC PANEL
BUN: 14 mg/dL (ref 6–23)
CO2: 29 mEq/L (ref 19–32)
Calcium: 9.8 mg/dL (ref 8.4–10.5)
Chloride: 102 mEq/L (ref 96–112)
Creatinine, Ser: 0.96 mg/dL (ref 0.40–1.50)
GFR: 93.79 mL/min (ref >60.00)
Glucose, Bld: 112 mg/dL — ABNORMAL HIGH (ref 70–99)
Potassium: 4.8 mEq/L (ref 3.5–5.1)
Sodium: 135 mEq/L (ref 135–145)

## 2014-11-17 MED ORDER — IOHEXOL 300 MG/ML  SOLN
100.0000 mL | Freq: Once | INTRAMUSCULAR | Status: AC | PRN
  Administered 2014-11-17: 100 mL via INTRAVENOUS

## 2014-11-17 MED ORDER — METRONIDAZOLE 500 MG PO TABS: ORAL_TABLET | ORAL | Status: DC

## 2014-11-17 MED ORDER — CIPROFLOXACIN HCL 500 MG PO TABS
ORAL_TABLET | ORAL | Status: DC
Start: 2014-11-17 — End: 2014-12-02

## 2014-11-17 NOTE — Progress Notes (Signed)
Patient ID: Spencer Good, male   DOB: April 01, 1976, 39 y.o.   MRN: [id number]   Subjective:    Patient ID: Spencer Good, male    DOB: [birth year], 39 y.o.   MRN: [id number]  HPI Spencer Good is a 39 year old white male new to GI today referred by Dr. Larose Good after recent hospitalization for acute diverticulitis with contained perforation. Patient has history of ADHD, obstructive sleep apnea, adult-onset diabetes mellitus and obesity. He was admitted to the hospital 58 through 10/25/2014 on the hospitalist service and was seen by Dr. Donne Good for surgery,GI was not involved. Patient says he had onset of some vague abdominal discomfort a couple of weeks prior to being admitted and then the day before his admission developed increased lower abdominal pain. He initially thought he was just constipated and had also been quite stressed over work issues. He tried taking a laxative that evening that worsened his pain. He went to urgent care and was told that he was constipated and to continue laxatives. The following morning he felt worse and also had no appetite which is quite unusual for him and went to the emergency room for evaluation. CT scan of the abdomen and pelvis showed an acute diverticulitis of the sigmoid Spencer with a contained perforation. With IV antibiotics and then discharged home on an eight-day course of Cipro and Flagyl which he has completed. Patient says he feels much better than he did but did notice some mild lower abdominal discomfort this past weekend while mowing his lawn. He also feels that his energy level has not returned to normal. His bowel movements have been fine his been eating without difficulty no fevers chills or sweats.  Review of Systems Pertinent positive and negative review of systems were noted in the above HPI section.  All other review of systems was otherwise negative.  Outpatient Encounter Prescriptions as of 11/17/2014  Medication Sig  . ciprofloxacin (CIPRO) 500 MG  tablet Take 1 tablet (500 mg total) by mouth 2 (two) times daily. For 8days  . fluticasone (FLONASE) 50 MCG/ACT nasal spray Place 1 spray into both nostrils as needed.   Marland Kitchen glucose blood (ONE TOUCH ULTRA TEST) test strip Check blood sugars no more than twice daily.  Marland Kitchen lisdexamfetamine (VYVANSE) 50 MG capsule Take 50 mg by mouth daily. Per Dr Spencer Good  . Multiple Vitamin (MULTIVITAMIN) tablet Take 1 tablet by mouth daily.  . zaleplon (SONATA) 10 MG capsule Take 10 mg by mouth as needed.  . [DISCONTINUED] metroNIDAZOLE (FLAGYL) 500 MG tablet Take 1 tablet (500 mg total) by mouth 3 (three) times daily. For 8days   No facility-administered encounter medications on file as of 11/17/2014.   No Known Allergies Patient Active Problem List   Diagnosis Date Noted  . Diverticulitis of Spencer with perforation 10/23/2014  . Diet-controlled diabetes mellitus 10/23/2014  . Elevated WBCs 01/27/2014  . OSA (obstructive sleep apnea) 04/21/2013  . Diabetes 02/17/2013  . Umbilical hernia 24/40/1027  . ADHD (attention deficit hyperactivity disorder)   . Tachycardia 08/29/2011  . Annual physical exam 07/08/2011  . Dermatitis, seborrheic 07/08/2011  . GOUT 06/29/2010  . TOBACCO USER 06/29/2010   History   Social History  . Marital Status: Single    Spouse Name: N/A  . Number of Children: 0  . 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 Smoking status: Current Some Day Smoker -- 1.00 packs/day for 20  years    Types: Cigarettes    Start date: 07/30/1992  . Smokeless tobacco: Never Used     Comment: Nov. 2015  . Alcohol Use: 0.0 oz/week    0 Standard drinks or equivalent per week     Comment: rarely 1-2 monthly  . Drug Use: No  . Sexual Activity: Not Currently   Other Topics Concern  . Not on file   Social History Narrative    Mr. Willetts's family history includes CAD in his other; Heart disease in his maternal  grandfather; Stroke in his father. There is no history of Spencer cancer, Prostate cancer, Diabetes, Spencer polyps, Esophageal cancer, Gallbladder disease, or Kidney disease.      Objective:    Filed Vitals:   11/17/14 1014  BP: 138/86  Pulse:     Physical Exam  well-developed white male, obese, in no acute distress quite pleasant blood pressure 136/86 pulse in the 80s. BMI 45.1. HEENT ;nontraumatic normocephalic EOMI PERRLA sclera anicteric, Supple; no JVD, Cardiovascular; regular rate and rhythm with S1-S2 no murmur or gallop, Pulmonary; clear bilaterally, Abdomen ;obese soft he does have a reducible umbilical hernia is tender in the suprapubic area guarding or rebound no palpable mass or hepatosplenomegaly bowel sounds are present, Rectal; exam not done, EXt;no clubbing cyanosis or edema skin warm and dry, Psych; mood and affect appropriate       Assessment & Plan:   #1 39 yo male with recent hospitalization with acute sigmoid diverticulitis with contained  Perforation. He is significantly improved but continues to have lower abdominal tenderness on exam- will need to r/o partially treated diverticulitis, doubt abscess but need to r/o #2 obesity #3 AODM #4 ADHD #5 umbilical hernia  Plan; Will check CBC, and BMET Schedule for CT adb/pelvis today, then determine if needs another coarse of antibiotics He will also be scheduled for Colonoscopy with DR. Henrene Good - thiw will be schedule 6-8 weeks from now to assure diverticulitis is completely resolved     Spencer Ferguson PA-C 11/17/2014   Cc: Spencer Branch, MD

## 2014-11-17 NOTE — Progress Notes (Signed)
Agree with initial assessment and plans. Need to make sure CT, as planned, okay before colonoscopy

## 2014-11-17 NOTE — Patient Instructions (Signed)
Please go to the basement level to have your labs drawn.  You have been scheduled for a colonoscopy. Please follow written instructions given to you at your visit today.  Please pick up your prep supplies at the pharmacy within the next 1-3 days. [facility] [address]/ [place] If you use inhalers (even only as needed), please bring them with you on the day of your procedure. Your physician has requested that you go to www.startemmi.com and enter the access code given to you at your visit today. This web site gives a general overview about your procedure. However, you should still follow specific instructions given to you by our office regarding your preparation for the procedure.   You have been scheduled for a CT scan of the abdomen and pelvis at [facility] ([address]---this is in the same building as Press [facility]).   You are scheduled on  11-17-2014 at  2:00 PM  You should arrive 15 minutes prior to your appointment time for registration. Please follow the written instructions below on the day of your exam:  WARNING: IF YOU ARE ALLERGIC TO IODINE/X-RAY DYE, PLEASE NOTIFY RADIOLOGY IMMEDIATELY AT [phone]! YOU WILL BE GIVEN A 13 HOUR PREMEDICATION PREP.  1) Do not eat or drink anything after 10:00 am (4 hours prior to your test) 2) You have been given 2 bottles of oral contrast to drink. The solution may taste  better if refrigerated, but do NOT add ice or any other liquid to this solution. Shake  well before drinking.    Drink 1 bottle of contrast @ 1:00 Pm  (2 hours prior to your exam)  Drink 1 bottle of contrast @ 2:00 Pm  (1 hour prior to your exam)  You may take any medications as prescribed with a small amount of water except for the following: Metformin, Glucophage, Glucovance, Avandamet, Riomet, Fortamet, Actoplus Met, Janumet, Glumetza or Metaglip. The above medications must be held the day of the exam AND 48 hours after the exam.  The purpose of  you drinking the oral contrast is to aid in the visualization of your intestinal tract. The contrast solution may cause some diarrhea. Before your exam is started, you will be given a small amount of fluid to drink. Depending on your individual set of symptoms, you may also receive an intravenous injection of x-ray contrast/dye. Plan on being at [facility] for 30 minutes or long, depending on the type of exam you are having performed.  If you have any questions regarding your exam or if you need to reschedule, you may call the CT department at [phone] between the hours of 8:00 am and 5:00 pm, Monday-Friday.  ________________________________________________________________________

## 2014-11-17 NOTE — Telephone Encounter (Signed)
Spoke with the patient about the CT results. Advised to pick up ATB's (Cipro and Flagyl) today and get started on them. Follow up appointment is 12/02/14. Patient expresses understanding of the plan.

## 2014-11-28 ENCOUNTER — Other Ambulatory Visit: Payer: Self-pay | Admitting: Internal Medicine

## 2014-11-28 ENCOUNTER — Encounter: Payer: Self-pay | Admitting: Internal Medicine

## 2014-11-28 MED ORDER — VARENICLINE TARTRATE 0.5 MG PO TABS: 0.5000 mg | ORAL_TABLET | Freq: Two times a day (BID) | ORAL | Status: DC

## 2014-11-28 MED ORDER — VARENICLINE TARTRATE 0.5 MG X 11 & 1 MG X 42 PO MISC: ORAL | Status: DC

## 2014-11-28 NOTE — Telephone Encounter (Signed)
Rx for Chantix faxed to [facility]. Pt informed via MyChart by Dr. Larose Kells that Rx has been sent.

## 2014-12-02 ENCOUNTER — Ambulatory Visit (INDEPENDENT_AMBULATORY_CARE_PROVIDER_SITE_OTHER): Payer: BLUE CROSS/BLUE SHIELD | Admitting: Physician Assistant

## 2014-12-02 ENCOUNTER — Encounter: Payer: Self-pay | Admitting: Physician Assistant

## 2014-12-02 VITALS — BP 132/92 | HR 107 | Ht 71.25 in | Wt 333.0 lb

## 2014-12-02 DIAGNOSIS — K5732 Diverticulitis of large intestine without perforation or abscess without bleeding: Secondary | ICD-10-CM

## 2014-12-02 MED ORDER — MOVIPREP 100 G PO SOLR: 1.0000 | ORAL | Status: DC

## 2014-12-02 NOTE — Patient Instructions (Signed)
You are scheduled for 01-02-2015 . Arrive at 1:00 Pm . Instructions given you today. We sent the prep to [facility] [address]   Make appointment to see Dr Donne Hazel regarding colon resection. [facility] [phone]

## 2014-12-02 NOTE — Progress Notes (Signed)
Patient ID: Spencer Good, male   DOB: 03/18/1976, 39 y.o.   MRN: [id number]   Subjective:    Patient ID: Spencer Good, male    DOB: 01-Feb-1976, 39 y.o.   MRN: [id number]  HPI Spencer Good is a pleasant 39 year old male who was initially recently seen on 11/17/2014 in referral by Dr. Tessie Good after recent hospitalization for acute diverticulitis with contained perforation. He had been hospitalized 10/23/2014 through 10/25/2014 on the hospitalist service and had been seen by Dr. Donne Good for surgery, GI was not involved. CT scan of the abdomen and pelvis on admission showed an acute diverticulitis of the sigmoid colon with a contained perforation.  He was treated with IV antibiotics and discharged home with an eight-day course of Cipro and Flagyl. He completed that in when seen on the office in the office on 11/17/2014 he was significantly improved but continued to have some mild lower abdominal tenderness on exam. CT scan of the abdomen and pelvis was repeated and this showed when compared to the recent exam inflammatory changes in the sigmoid mesocolon adjacent to numerous colonic diverticuli have decreased stents of colonic wall thickening throughout the mid sigmoid colon and 2 tiny locules of gas within the sigmoid mesocolon suggestive of a focally contained perforation no discrete abscess, also noticed age advanced atherosclerosis. Patient was given another 2 week course of Cipro and Flagyl which he completed yesterday. He says he feels well and hasn't had any abdominal discomfort over the last 10 days. His bowel movements are normal. No fevers chills or sweats. He says he really didn't have horrible of pain with the original episode of diverticulitis neck concerns him regarding how to no if it recurs. He does have a lot of intra-abdominal fat which may have contributed to decreased mesenteric sensitivity. Patient is artery scheduled for a colonoscopy with Dr. Henrene Good next month and plans to see Dr.  Donne Good in follow-up after that.   Review of Systems  Pertinent positive and negative review of systems were noted in the above HPI section.  All other review of systems was otherwise negative.  Outpatient Encounter Prescriptions as of 12/02/2014  Medication Sig  . fluticasone (FLONASE) 50 MCG/ACT nasal spray Place 1 spray into both nostrils as needed.   Marland Kitchen glucose blood (ONE TOUCH ULTRA TEST) test strip Check blood sugars no more than twice daily.  Marland Kitchen lisdexamfetamine (VYVANSE) 50 MG capsule Take 50 mg by mouth daily. Per Dr Spencer Good  . Multiple Vitamin (MULTIVITAMIN) tablet Take 1 tablet by mouth daily.  . varenicline (CHANTIX) 0.5 MG tablet Take 1 tablet (0.5 mg total) by mouth 2 (two) times daily.  . zaleplon (SONATA) 10 MG capsule Take 10 mg by mouth as needed.  Marland Kitchen MOVIPREP 100 G SOLR Take 1 kit (200 g total) by mouth as directed.  . varenicline (CHANTIX PAK) 0.5 MG X 11 & 1 MG X 42 tablet Take one 0.5 mg tablet by mouth once daily for 3 days, then increase to one 0.5 mg tablet twice daily for 4 days, then increase to one 1 mg tablet twice daily. (Patient not taking: Reported on 12/02/2014)  . [DISCONTINUED] ciprofloxacin (CIPRO) 500 MG tablet 1 tablet by mouth twice daily for 14 days  . [DISCONTINUED] metroNIDAZOLE (FLAGYL) 500 MG tablet Take 1 tablet by mouth twice daily for 14 days   No facility-administered encounter medications on file as of 12/02/2014.   No Known Allergies Patient Active Problem List   Diagnosis Date Noted  . Diverticulitis of colon  with perforation 10/23/2014  . Diet-controlled diabetes mellitus 10/23/2014  . Elevated WBCs 01/27/2014  . OSA (obstructive sleep apnea) 04/21/2013  . Diabetes 02/17/2013  . Umbilical hernia 51/89/8421  . ADHD (attention deficit hyperactivity disorder)   . Tachycardia 08/29/2011  . Annual physical exam 07/08/2011  . Dermatitis, seborrheic 07/08/2011  . GOUT 06/29/2010  . TOBACCO USER 06/29/2010   History   Social History  .  Marital Status: Single    Spouse Name: N/A  . Number of Children: 0  . 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 Smoking status: Current Some Day Smoker -- 1.00 packs/day for 20 years    Types: Cigarettes    Start date: 07/30/1992  . Smokeless tobacco: Never Used     Comment: Nov. 2015  . Alcohol Use: 0.0 oz/week    0 Standard drinks or equivalent per week     Comment: rarely 1-2 monthly  . Drug Use: No  . Sexual Activity: Not Currently   Other Topics Concern  . Not on file   Social History Narrative    Spencer Good's family history includes CAD in his other; Heart disease in his maternal grandfather; Stroke in his father. There is no history of Colon cancer, Prostate cancer, Diabetes, Colon polyps, Esophageal cancer, Gallbladder disease, or Kidney disease.      Objective:    Filed Vitals:   12/02/14 1509  BP: 132/92  Pulse: 107    Physical Exam  well-developed white male in no acute distress, pleasant blood pressure 132/90 pulse 100 height 5 foot 11 weight 333, BMI 46 . HEENT; nontraumatic normocephalic EOMI PERRLA sclera anicteric, Cardiovascular; regular rate and rhythm with S1-S2 no murmur or gallop, Pulmonary ;clear bilaterally, Abdomen; obese soft nontender nondistended bowel sounds are active there is no palpable mass or hepatosplenomegaly bowel, Rectal ;exam not done, Extremities ;no clubbing cyanosis or edema skin warm and dry, Psych ;mood and affect appropriate       Assessment & Plan:   #1  39 yo male with acute sigmoid diverticulitis with contained perforation 10/23/2014. Persistent diverticulitis on CT 11/17/2014 and has completed another 2 week course of Cipro/Flagyl. He is currently asymptomatic #2 morbid obesity #3 OSA #4 umbilical hernia  #5 ADHD  Plan;  Proceed with colonoscopy with Dr. Henrene Good which had previously been scheduled  Pt will make an appt to see Dr Spencer Good  After  colonoscopy to discuss elective segmental colon resection.  Pt aware to call for any problems with recurrent pain in the interim    Spencer Ferguson PA-C 12/02/2014   Cc: Colon Branch, MD

## 2014-12-06 NOTE — Progress Notes (Signed)
Agree with assessment and plans as outlined 

## 2014-12-26 ENCOUNTER — Telehealth: Payer: Self-pay | Admitting: Internal Medicine

## 2014-12-26 DIAGNOSIS — K429 Umbilical hernia without obstruction or gangrene: Secondary | ICD-10-CM

## 2014-12-26 NOTE — Telephone Encounter (Signed)
Referral placed to general surgery, Dr. Donne Hazel

## 2014-12-26 NOTE — Telephone Encounter (Signed)
Caller name: Petr  Relation to pt: Call back number: [phone] Pharmacy:  Reason for call:   Patient states that he is having a colonoscopy on Monday and is also wanting to schedule a surgical consult with the physician whom he saw while he was in the hospital.(he cannot remember name) to have his hernia fixed.

## 2014-12-26 NOTE — Telephone Encounter (Signed)
Please arrange a surgical referral, no urgent, DX umbilical hernia w/ Dr Rolm Bookbinder

## 2014-12-26 NOTE — Telephone Encounter (Signed)
Please advise 

## 2015-01-02 ENCOUNTER — Encounter: Payer: Self-pay | Admitting: Internal Medicine

## 2015-01-02 ENCOUNTER — Ambulatory Visit (AMBULATORY_SURGERY_CENTER): Payer: BLUE CROSS/BLUE SHIELD | Admitting: Internal Medicine

## 2015-01-02 VITALS — BP 147/90 | HR 81 | Temp 97.9°F | Resp 12 | Ht 71.0 in | Wt 333.0 lb

## 2015-01-02 DIAGNOSIS — R935 Abnormal findings on diagnostic imaging of other abdominal regions, including retroperitoneum: Secondary | ICD-10-CM | POA: Diagnosis not present

## 2015-01-02 DIAGNOSIS — K5732 Diverticulitis of large intestine without perforation or abscess without bleeding: Secondary | ICD-10-CM | POA: Diagnosis present

## 2015-01-02 DIAGNOSIS — D120 Benign neoplasm of cecum: Secondary | ICD-10-CM | POA: Diagnosis not present

## 2015-01-02 MED ORDER — SODIUM CHLORIDE 0.9 % IV SOLN: 500.0000 mL | INTRAVENOUS | Status: DC

## 2015-01-02 NOTE — Patient Instructions (Addendum)
YOU HAD AN ENDOSCOPIC PROCEDURE TODAY AT [facility]:   Refer to the procedure report that was given to you for any specific questions about what was found during the examination.  If the procedure report does not answer your questions, please call your gastroenterologist to clarify.  If you requested that your care partner not be given the details of your procedure findings, then the procedure report has been included in a sealed envelope for you to review at your convenience later.  YOU SHOULD EXPECT: Some feelings of bloating in the abdomen. Passage of more gas than usual.  Walking can help get rid of the air that was put into your GI tract during the procedure and reduce the bloating. If you had a lower endoscopy (such as a colonoscopy or flexible sigmoidoscopy) you may notice spotting of blood in your stool or on the toilet paper. If you underwent a bowel prep for your procedure, you may not have a normal bowel movement for a few days.  Please Note:  You might notice some irritation and congestion in your nose or some drainage.  This is from the oxygen used during your procedure.  There is no need for concern and it should clear up in a day or so.  SYMPTOMS TO REPORT IMMEDIATELY:   Following lower endoscopy (colonoscopy or flexible sigmoidoscopy):  Excessive amounts of blood in the stool  Significant tenderness or worsening of abdominal pains  Swelling of the abdomen that is new, acute  Fever of 100F or higher  For urgent or emergent issues, a gastroenterologist can be reached at any hour by calling [phone].   DIET: Your first meal following the procedure should be a small meal and then it is ok to progress to your normal diet. Heavy or fried foods are harder to digest and may make you feel nauseous or bloated.  Likewise, meals heavy in dairy and vegetables can increase bloating.  Drink plenty of fluids but you should avoid alcoholic beverages for 24  hours.  ACTIVITY:  You should plan to take it easy for the rest of today and you should NOT DRIVE or use heavy machinery until tomorrow (because of the sedation medicines used during the test).    FOLLOW UP: Our staff will call the number listed on your records the next business day following your procedure to check on you and address any questions or concerns that you may have regarding the information given to you following your procedure. If we do not reach you, we will leave a message.  However, if you are feeling well and you are not experiencing any problems, there is no need to return our call.  We will assume that you have returned to your regular daily activities without incident.  If any biopsies were taken you will be contacted by phone or by letter within the next 1-3 weeks.  Please call us at [phone] if you have not heard about the biopsies in 3 weeks.    SIGNATURES/CONFIDENTIALITY: You and/or your care partner have signed paperwork which will be entered into your electronic medical record.  These signatures attest to the fact that that the information above on your After Visit Summary has been reviewed and is understood.  Full responsibility of the confidentiality of this discharge information lies with you and/or your care-partner.    Handouts were given to your care partner on polyps, diverticulosis, and a high fiber diet with liberal fluid intake. You may resume your  current medications today. Await biopsy results. Keep surgical follow-up as already planned per Dr. Henrene Pastor. Please call if any questions or concerns.

## 2015-01-02 NOTE — Progress Notes (Signed)
Pt is HIPPA pt.  His mother was not brought back to the recovery room. maw

## 2015-01-02 NOTE — Progress Notes (Signed)
Called to room to assist during endoscopic procedure.  Patient ID and intended procedure confirmed with present staff. Received instructions for my participation in the procedure from the performing physician.  

## 2015-01-02 NOTE — Op Note (Signed)
[place]  [address]. [place], [address]   COLONOSCOPY PROCEDURE REPORT  PATIENT: Spencer Good, Spencer Good  MR#: [id number] BIRTHDATE: 1975-08-06 , 38  yrs. old GENDER: male ENDOSCOPIST: Eustace Quail, MD REFERRED BL:[id number] Donne Hazel, M.D. PROCEDURE DATE:  01/02/2015 PROCEDURE:   Colonoscopy, diagnostic and Colonoscopy with snare polypectomy x 1 First Screening Colonoscopy - Avg.  risk and is 50 yrs.  old or older - No.  Prior Negative Screening - Now for repeat screening. N/A  History of Adenoma - Now for follow-up colonoscopy & has been > or = to 3 yrs.  N/A  Polyps removed today? Yes ASA CLASS:   Class II INDICATIONS:an abnormal CT. Probable diverticulitis with microperforation. MEDICATIONS: Monitored anesthesia care and Propofol 450 mg IV  DESCRIPTION OF PROCEDURE:   After the risks benefits and alternatives of the procedure were thoroughly explained, informed consent was obtained.  The digital rectal exam revealed no abnormalities of the rectum.   The LB [id number]  endoscope was introduced through the anus and advanced to the cecum, which was identified by both the appendix and ileocecal valve. No adverse events experienced.   The quality of the prep was excellent. (MoviPrep was used)  The instrument was then slowly withdrawn as the colon was fully examined. Estimated blood loss is zero unless otherwise noted in this procedure report.    COLON FINDINGS: A single polyp measuring 2 mm in size was found at the cecum.  A polypectomy was performed with a cold snare.  The resection was complete, the polyp tissue was completely retrieved and sent to histology.   There was moderate diverticulosis noted in the left colon.   The examination was otherwise normal. Retroflexed views revealed internal hemorrhoids. The time to cecum = 1.8 Withdrawal time = 8.6   The scope was withdrawn and the procedure completed. COMPLICATIONS: There were no immediate  complications.  ENDOSCOPIC IMPRESSION: 1.   Single polyp was found at the cecum; polypectomy was performed with a cold snare 2.   Moderate diverticulosis was noted in the left colon 3.   The examination was otherwise normal  RECOMMENDATIONS: 1. Repeat colonoscopy in 5 years if polyp adenomatous; otherwise age 76 2. Keep surgical follow-up as already planned  eSigned:  Eustace Quail, MD 01/02/2015 1:59 PM   cc: Rolm Bookbinder, MD, Kathlene November, MD, and The Patient

## 2015-01-02 NOTE — Progress Notes (Signed)
A/ox3, pleased with MAC, report to RN 

## 2015-01-03 ENCOUNTER — Telehealth: Payer: Self-pay | Admitting: *Deleted

## 2015-01-03 NOTE — Telephone Encounter (Signed)
MESSAGE LEFT

## 2015-01-05 ENCOUNTER — Encounter: Payer: Self-pay | Admitting: Internal Medicine

## 2015-01-19 ENCOUNTER — Other Ambulatory Visit: Payer: Self-pay | Admitting: General Surgery

## 2015-01-24 ENCOUNTER — Other Ambulatory Visit: Payer: Self-pay

## 2015-01-24 ENCOUNTER — Encounter (HOSPITAL_COMMUNITY)
Admission: RE | Admit: 2015-01-24 | Discharge: 2015-01-24 | Disposition: A | Discharge status: Home / Self Care | Payer: BLUE CROSS/BLUE SHIELD | Source: Ambulatory Visit | Attending: General Surgery | Admitting: General Surgery

## 2015-01-24 ENCOUNTER — Encounter (HOSPITAL_COMMUNITY): Payer: Self-pay

## 2015-01-24 DIAGNOSIS — Z01812 Encounter for preprocedural laboratory examination: Secondary | ICD-10-CM | POA: Insufficient documentation

## 2015-01-24 DIAGNOSIS — Z01810 Encounter for preprocedural cardiovascular examination: Secondary | ICD-10-CM | POA: Diagnosis not present

## 2015-01-24 DIAGNOSIS — K429 Umbilical hernia without obstruction or gangrene: Secondary | ICD-10-CM | POA: Diagnosis not present

## 2015-01-24 HISTORY — DX: Personal history of colon polyps, unspecified: Z86.0100

## 2015-01-24 HISTORY — DX: Personal history of colonic polyps: Z86.010

## 2015-01-24 HISTORY — DX: Insomnia, unspecified: G47.00

## 2015-01-24 LAB — CBC WITH DIFFERENTIAL/PLATELET
Basophils Absolute: 0 10*3/uL (ref 0.0–0.1)
Basophils Relative: 0 % (ref 0–1)
Eosinophils Absolute: 0.3 10*3/uL (ref 0.0–0.7)
Eosinophils Relative: 3 % (ref 0–5)
HCT: 42.6 % (ref 39.0–52.0)
Hemoglobin: 14.7 g/dL (ref 13.0–17.0)
Lymphocytes Relative: 39 % (ref 12–46)
Lymphs Abs: 3.8 10*3/uL (ref 0.7–4.0)
MCH: 28.4 pg (ref 26.0–34.0)
MCHC: 34.5 g/dL (ref 30.0–36.0)
MCV: 82.4 fL (ref 78.0–100.0)
Monocytes Absolute: 0.5 10*3/uL (ref 0.1–1.0)
Monocytes Relative: 5 % (ref 3–12)
Neutro Abs: 5.2 10*3/uL (ref 1.7–7.7)
Neutrophils Relative %: 53 % (ref 43–77)
Platelets: 205 10*3/uL (ref 150–400)
RBC: 5.17 MIL/uL (ref 4.22–5.81)
RDW: 15.3 % (ref 11.5–15.5)
WBC: 9.8 10*3/uL (ref 4.0–10.5)

## 2015-01-24 LAB — BASIC METABOLIC PANEL
Anion gap: 12 (ref 5–15)
BUN: 15 mg/dL (ref 6–20)
CO2: 20 mmol/L — ABNORMAL LOW (ref 22–32)
Calcium: 8.9 mg/dL (ref 8.9–10.3)
Chloride: 104 mmol/L (ref 101–111)
Creatinine, Ser: 0.91 mg/dL (ref 0.61–1.24)
GFR calc Af Amer: >60 mL/min (ref >60)
GFR calc non Af Amer: >60 mL/min (ref >60)
Glucose, Bld: 158 mg/dL — ABNORMAL HIGH (ref 65–99)
Potassium: 4.6 mmol/L (ref 3.5–5.1)
Sodium: 136 mmol/L (ref 135–145)

## 2015-01-24 LAB — GLUCOSE, CAPILLARY: Glucose-Capillary: 168 mg/dL — ABNORMAL HIGH (ref 65–99)

## 2015-01-24 NOTE — Pre-Procedure Instructions (Signed)
NAEL PETROSYAN  01/24/2015      [facility] - [place], [place] - [address] [place] [address] Phone: [phone] Fax: [phone]    Your procedure is scheduled on Wed, Aug 17 @ 8:30 AM  Report to [facility] Admitting at 6:30 AM  Call this number if you have problems the morning of surgery:  [phone]   Remember:  Do not eat food or drink liquids after midnight.  Take these medicines the morning of surgery with A SIP OF WATER Flonase(Fluticasone)              No Goody's,BC's,Aleve,Aspirin,Ibuprofen,Fish Oil,or any Herbal Medications.   Do not wear jewelry.  Do not wear lotions, powders, or colognes.  You may wear deodorant.  Men may shave face and neck.  Do not bring valuables to the hospital.  [facility] is not responsible for any belongings or valuables.  Contacts, dentures or bridgework may not be worn into surgery.  Leave your suitcase in the car.  After surgery it may be brought to your room.  For patients admitted to the hospital, discharge time will be determined by your treatment team.  Patients discharged the day of surgery will not be allowed to drive home.    Special instructions:  [facility] - Preparing for Surgery  Before surgery, you can play an important role.  Because skin is not sterile, your skin needs to be as free of germs as possible.  You can reduce the number of germs on you skin by washing with CHG (chlorahexidine gluconate) soap before surgery.  CHG is an antiseptic cleaner which kills germs and bonds with the skin to continue killing germs even after washing.  Please DO NOT use if you have an allergy to CHG or antibacterial soaps.  If your skin becomes reddened/irritated stop using the CHG and inform your nurse when you arrive at Short Stay.  Do not shave (including legs and underarms) for at least 48 hours prior to the first CHG shower.  You may shave  your face.  Please follow these instructions carefully:   1.  Shower with CHG Soap the night before surgery and the                                morning of Surgery.  2.  If you choose to wash your hair, wash your hair first as usual with your       normal shampoo.  3.  After you shampoo, rinse your hair and body thoroughly to remove the                      Shampoo.  4.  Use CHG as you would any other liquid soap.  You can apply chg directly       to the skin and wash gently with scrungie or a clean washcloth.  5.  Apply the CHG Soap to your body ONLY FROM THE NECK DOWN.        Do not use on open wounds or open sores.  Avoid contact with your eyes,       ears, mouth and genitals (private parts).  Wash genitals (private parts)       with your normal soap.  6.  Wash thoroughly, paying special attention to the area  where your surgery        will be performed.  7.  Thoroughly rinse your body with warm water from the neck down.  8.  DO NOT shower/wash with your normal soap after using and rinsing off       the CHG Soap.  9.  Pat yourself dry with a clean towel.            10.  Wear clean pajamas.            11.  Place clean sheets on your bed the night of your first shower and do not        sleep with pets.  Day of Surgery  Do not apply any lotions/deoderants the morning of surgery.  Please wear clean clothes to the hospital/surgery center.    Please read over the following fact sheets that you were given. Pain Booklet, Coughing and Deep Breathing and Surgical Site Infection Prevention

## 2015-01-24 NOTE — Progress Notes (Signed)
Pt doesn't have a cardiologist  Medical Md is Dr.Joe Larose Kells  Denies ever having a heart cath,stress test,echo  Denies EKG or CXR in past yr

## 2015-01-25 LAB — HEMOGLOBIN A1C
Hgb A1c MFr Bld: 6.5 % — ABNORMAL HIGH (ref 4.8–5.6)
Mean Plasma Glucose: 140 mg/dL

## 2015-01-31 MED ORDER — HEPARIN SODIUM (PORCINE) 5000 UNIT/ML IJ SOLN
5000.0000 [IU] | INTRAMUSCULAR | Status: AC
Start: 2015-02-01 — End: 2015-02-01
  Administered 2015-02-01: 5000 [IU] via SUBCUTANEOUS
  Filled 2015-01-31: qty 1

## 2015-01-31 MED ORDER — DEXTROSE 5 % IV SOLN
3.0000 g | INTRAVENOUS | Status: AC
  Administered 2015-02-01: 3 g via INTRAVENOUS
  Filled 2015-01-31: qty 3000

## 2015-02-01 ENCOUNTER — Telehealth: Payer: Self-pay | Admitting: Internal Medicine

## 2015-02-01 ENCOUNTER — Ambulatory Visit (HOSPITAL_COMMUNITY): Payer: BLUE CROSS/BLUE SHIELD | Admitting: Anesthesiology

## 2015-02-01 ENCOUNTER — Observation Stay (HOSPITAL_COMMUNITY)
Admission: RE | Admit: 2015-02-01 | Discharge: 2015-02-02 | Disposition: A | Discharge status: Home / Self Care | Payer: BLUE CROSS/BLUE SHIELD | Source: Ambulatory Visit | Attending: General Surgery | Admitting: General Surgery

## 2015-02-01 ENCOUNTER — Encounter (HOSPITAL_COMMUNITY): Payer: Self-pay | Admitting: Certified Registered Nurse Anesthetist

## 2015-02-01 ENCOUNTER — Encounter (HOSPITAL_COMMUNITY)
Admission: RE | Disposition: A | Discharge status: Home / Self Care | Payer: Self-pay | Source: Ambulatory Visit | Attending: General Surgery

## 2015-02-01 DIAGNOSIS — Z87891 Personal history of nicotine dependence: Secondary | ICD-10-CM | POA: Insufficient documentation

## 2015-02-01 DIAGNOSIS — F329 Major depressive disorder, single episode, unspecified: Secondary | ICD-10-CM | POA: Insufficient documentation

## 2015-02-01 DIAGNOSIS — E119 Type 2 diabetes mellitus without complications: Secondary | ICD-10-CM | POA: Diagnosis not present

## 2015-02-01 DIAGNOSIS — K420 Umbilical hernia with obstruction, without gangrene: Principal | ICD-10-CM | POA: Diagnosis present

## 2015-02-01 DIAGNOSIS — Z6841 Body Mass Index (BMI) 40.0 and over, adult: Secondary | ICD-10-CM | POA: Insufficient documentation

## 2015-02-01 HISTORY — PX: INSERTION OF MESH: SHX5868

## 2015-02-01 HISTORY — PX: UMBILICAL HERNIA REPAIR: SHX196

## 2015-02-01 LAB — GLUCOSE, CAPILLARY
Glucose-Capillary: 162 mg/dL — ABNORMAL HIGH (ref 65–99)
Glucose-Capillary: 149 mg/dL — ABNORMAL HIGH (ref 65–99)
Glucose-Capillary: 198 mg/dL — ABNORMAL HIGH (ref 65–99)
Glucose-Capillary: 202 mg/dL — ABNORMAL HIGH (ref 65–99)
Glucose-Capillary: 123 mg/dL — ABNORMAL HIGH (ref 65–99)

## 2015-02-01 SURGERY — REPAIR, HERNIA, UMBILICAL, LAPAROSCOPIC
Anesthesia: General

## 2015-02-01 MED ORDER — PROMETHAZINE HCL 25 MG/ML IJ SOLN: 6.2500 mg | INTRAMUSCULAR | Status: DC | PRN

## 2015-02-01 MED ORDER — GLYCOPYRROLATE 0.2 MG/ML IJ SOLN
INTRAMUSCULAR | Status: AC
  Filled 2015-02-01: qty 3

## 2015-02-01 MED ORDER — VARENICLINE TARTRATE 0.5 MG PO TABS
0.5000 mg | ORAL_TABLET | Freq: Two times a day (BID) | ORAL | Status: DC
  Administered 2015-02-01: 0.5 mg via ORAL
  Filled 2015-02-01 (×4): qty 1

## 2015-02-01 MED ORDER — INSULIN ASPART 100 UNIT/ML ~~LOC~~ SOLN
0.0000 [IU] | Freq: Three times a day (TID) | SUBCUTANEOUS | Status: DC
  Administered 2015-02-01: 5 [IU] via SUBCUTANEOUS
  Administered 2015-02-02: 2 [IU] via SUBCUTANEOUS

## 2015-02-01 MED ORDER — ONDANSETRON HCL 4 MG/2ML IJ SOLN
INTRAMUSCULAR | Status: DC | PRN
  Administered 2015-02-01: 4 mg via INTRAVENOUS

## 2015-02-01 MED ORDER — GLYCOPYRROLATE 0.2 MG/ML IJ SOLN
INTRAMUSCULAR | Status: DC | PRN
Start: 2015-02-01 — End: 2015-02-01
  Administered 2015-02-01: 0.6 mg via INTRAVENOUS

## 2015-02-01 MED ORDER — NEOSTIGMINE METHYLSULFATE 10 MG/10ML IV SOLN
INTRAVENOUS | Status: AC
Start: 2015-02-01 — End: 2015-02-01
  Filled 2015-02-01: qty 1

## 2015-02-01 MED ORDER — MIDAZOLAM HCL 2 MG/2ML IJ SOLN
INTRAMUSCULAR | Status: AC
  Filled 2015-02-01: qty 4

## 2015-02-01 MED ORDER — SODIUM CHLORIDE 0.9 % IR SOLN
Status: DC | PRN
  Administered 2015-02-01: 1000 mL

## 2015-02-01 MED ORDER — LACTATED RINGERS IV SOLN
INTRAVENOUS | Status: DC | PRN
  Administered 2015-02-01 (×2): via INTRAVENOUS

## 2015-02-01 MED ORDER — ROCURONIUM BROMIDE 50 MG/5ML IV SOLN
INTRAVENOUS | Status: AC
  Filled 2015-02-01: qty 1

## 2015-02-01 MED ORDER — METHOCARBAMOL 500 MG PO TABS
500.0000 mg | ORAL_TABLET | Freq: Four times a day (QID) | ORAL | Status: DC | PRN
  Administered 2015-02-01 (×2): 500 mg via ORAL
  Filled 2015-02-01 (×2): qty 1

## 2015-02-01 MED ORDER — ROCURONIUM BROMIDE 100 MG/10ML IV SOLN
INTRAVENOUS | Status: DC | PRN
  Administered 2015-02-01: 50 mg via INTRAVENOUS

## 2015-02-01 MED ORDER — FENTANYL CITRATE (PF) 250 MCG/5ML IJ SOLN
INTRAMUSCULAR | Status: AC
  Filled 2015-02-01: qty 5

## 2015-02-01 MED ORDER — SODIUM CHLORIDE 0.9 % IV SOLN
INTRAVENOUS | Status: DC
  Administered 2015-02-01: 13:00:00 via INTRAVENOUS

## 2015-02-01 MED ORDER — MORPHINE SULFATE (PF) 2 MG/ML IV SOLN
INTRAVENOUS | Status: AC
  Filled 2015-02-01: qty 1

## 2015-02-01 MED ORDER — ACETAMINOPHEN 650 MG RE SUPP: 650.0000 mg | Freq: Four times a day (QID) | RECTAL | Status: DC | PRN

## 2015-02-01 MED ORDER — LISDEXAMFETAMINE DIMESYLATE 20 MG PO CAPS: 20.0000 mg | ORAL_CAPSULE | Freq: Every day | ORAL | Status: DC

## 2015-02-01 MED ORDER — SUCCINYLCHOLINE CHLORIDE 20 MG/ML IJ SOLN
INTRAMUSCULAR | Status: AC
  Filled 2015-02-01: qty 1

## 2015-02-01 MED ORDER — ONDANSETRON HCL 4 MG/2ML IJ SOLN: 4.0000 mg | Freq: Four times a day (QID) | INTRAMUSCULAR | Status: DC | PRN

## 2015-02-01 MED ORDER — ENOXAPARIN SODIUM 40 MG/0.4ML ~~LOC~~ SOLN
40.0000 mg | SUBCUTANEOUS | Status: DC
  Administered 2015-02-01: 40 mg via SUBCUTANEOUS
  Filled 2015-02-01: qty 0.4

## 2015-02-01 MED ORDER — ACETAMINOPHEN 325 MG PO TABS: 650.0000 mg | ORAL_TABLET | Freq: Four times a day (QID) | ORAL | Status: DC | PRN

## 2015-02-01 MED ORDER — HYDROMORPHONE HCL 1 MG/ML IJ SOLN
0.2500 mg | INTRAMUSCULAR | Status: DC | PRN
  Administered 2015-02-01: 0.5 mg via INTRAVENOUS

## 2015-02-01 MED ORDER — BUPIVACAINE-EPINEPHRINE (PF) 0.25% -1:200000 IJ SOLN
INTRAMUSCULAR | Status: AC
  Filled 2015-02-01: qty 30

## 2015-02-01 MED ORDER — MIDAZOLAM HCL 5 MG/5ML IJ SOLN
INTRAMUSCULAR | Status: DC | PRN
  Administered 2015-02-01: 2 mg via INTRAVENOUS

## 2015-02-01 MED ORDER — OXYCODONE HCL 5 MG PO TABS
5.0000 mg | ORAL_TABLET | ORAL | Status: DC | PRN
  Administered 2015-02-01 – 2015-02-02 (×3): 10 mg via ORAL
  Filled 2015-02-01 (×3): qty 2

## 2015-02-01 MED ORDER — LISDEXAMFETAMINE DIMESYLATE 50 MG PO CAPS
50.0000 mg | ORAL_CAPSULE | Freq: Every day | ORAL | Status: DC
  Filled 2015-02-01: qty 1

## 2015-02-01 MED ORDER — MORPHINE SULFATE (PF) 2 MG/ML IV SOLN: 2.0000 mg | INTRAVENOUS | Status: DC | PRN

## 2015-02-01 MED ORDER — HYDROMORPHONE HCL 1 MG/ML IJ SOLN
INTRAMUSCULAR | Status: AC
  Filled 2015-02-01: qty 1

## 2015-02-01 MED ORDER — BUPIVACAINE-EPINEPHRINE 0.25% -1:200000 IJ SOLN
INTRAMUSCULAR | Status: DC | PRN
  Administered 2015-02-01: 7 mL

## 2015-02-01 MED ORDER — PROPOFOL 10 MG/ML IV BOLUS
INTRAVENOUS | Status: DC | PRN
  Administered 2015-02-01: 50 mg via INTRAVENOUS
  Administered 2015-02-01: 200 mg via INTRAVENOUS

## 2015-02-01 MED ORDER — KETOROLAC TROMETHAMINE 15 MG/ML IJ SOLN
15.0000 mg | Freq: Four times a day (QID) | INTRAMUSCULAR | Status: DC | PRN
  Administered 2015-02-01: 15 mg via INTRAVENOUS
  Filled 2015-02-01: qty 1

## 2015-02-01 MED ORDER — NEOSTIGMINE METHYLSULFATE 10 MG/10ML IV SOLN
INTRAVENOUS | Status: DC | PRN
  Administered 2015-02-01: 4 mg via INTRAVENOUS

## 2015-02-01 MED ORDER — PROPOFOL 10 MG/ML IV BOLUS
INTRAVENOUS | Status: AC
  Filled 2015-02-01: qty 20

## 2015-02-01 MED ORDER — LISDEXAMFETAMINE DIMESYLATE 30 MG PO CAPS: 30.0000 mg | ORAL_CAPSULE | Freq: Every day | ORAL | Status: DC

## 2015-02-01 MED ORDER — FLUTICASONE PROPIONATE 50 MCG/ACT NA SUSP
1.0000 | NASAL | Status: DC | PRN
  Filled 2015-02-01: qty 16

## 2015-02-01 MED ORDER — DEXAMETHASONE SODIUM PHOSPHATE 4 MG/ML IJ SOLN
INTRAMUSCULAR | Status: AC
  Filled 2015-02-01: qty 1

## 2015-02-01 MED ORDER — ZOLPIDEM TARTRATE 5 MG PO TABS
5.0000 mg | ORAL_TABLET | Freq: Every evening | ORAL | Status: DC | PRN
  Administered 2015-02-01: 5 mg via ORAL
  Filled 2015-02-01: qty 1

## 2015-02-01 MED ORDER — SUCCINYLCHOLINE CHLORIDE 20 MG/ML IJ SOLN
INTRAMUSCULAR | Status: DC | PRN
  Administered 2015-02-01: 120 mg via INTRAVENOUS

## 2015-02-01 MED ORDER — INSULIN ASPART 100 UNIT/ML ~~LOC~~ SOLN: 0.0000 [IU] | Freq: Every day | SUBCUTANEOUS | Status: DC

## 2015-02-01 MED ORDER — FENTANYL CITRATE (PF) 100 MCG/2ML IJ SOLN
INTRAMUSCULAR | Status: DC | PRN
  Administered 2015-02-01: 100 ug via INTRAVENOUS
  Administered 2015-02-01: 150 ug via INTRAVENOUS
  Administered 2015-02-01: 100 ug via INTRAVENOUS
  Administered 2015-02-01: 50 ug via INTRAVENOUS

## 2015-02-01 MED ORDER — ONDANSETRON 4 MG PO TBDP: 4.0000 mg | ORAL_TABLET | Freq: Four times a day (QID) | ORAL | Status: DC | PRN

## 2015-02-01 SURGICAL SUPPLY — 56 items
APPLIER CLIP 5 13 M/L LIGAMAX5 (MISCELLANEOUS) ×2
APPLIER CLIP ROT 10 11.4 M/L (STAPLE)
CANISTER SUCTION 2500CC (MISCELLANEOUS) IMPLANT
CHLORAPREP W/TINT 26ML (MISCELLANEOUS) ×2 IMPLANT
CLIP APPLIE 5 13 M/L LIGAMAX5 (MISCELLANEOUS) ×1 IMPLANT
CLIP APPLIE ROT 10 11.4 M/L (STAPLE) IMPLANT
CLSR STERI-STRIP ANTIMIC 1/2X4 (GAUZE/BANDAGES/DRESSINGS) ×2 IMPLANT
COVER SURGICAL LIGHT HANDLE (MISCELLANEOUS) ×2 IMPLANT
DECANTER SPIKE VIAL GLASS SM (MISCELLANEOUS) ×2 IMPLANT
DEVICE RELIATACK FIXATION (MISCELLANEOUS) ×2 IMPLANT
DEVICE SECURE STRAP 25 ABSORB (INSTRUMENTS) ×2 IMPLANT
DEVICE TROCAR PUNCTURE CLOSURE (ENDOMECHANICALS) ×2 IMPLANT
DRAPE INCISE IOBAN 66X45 STRL (DRAPES) ×2 IMPLANT
DRAPE LAPAROSCOPIC ABDOMINAL (DRAPES) ×2 IMPLANT
DRSG TEGADERM 4X4.75 (GAUZE/BANDAGES/DRESSINGS) ×4 IMPLANT
ELECT REM PT RETURN 9FT ADLT (ELECTROSURGICAL) ×2
ELECTRODE REM PT RTRN 9FT ADLT (ELECTROSURGICAL) ×1 IMPLANT
GAUZE SPONGE 2X2 8PLY NS (GAUZE/BANDAGES/DRESSINGS) ×2 IMPLANT
GAUZE SPONGE 2X2 8PLY STRL LF (GAUZE/BANDAGES/DRESSINGS) ×1 IMPLANT
GAUZE SPONGE 4X4 12PLY STRL (GAUZE/BANDAGES/DRESSINGS) ×2 IMPLANT
GLOVE BIO SURGEON STRL SZ7 (GLOVE) ×2 IMPLANT
GLOVE BIOGEL PI IND STRL 7.0 (GLOVE) ×4 IMPLANT
GLOVE BIOGEL PI INDICATOR 7.0 (GLOVE) ×4
GLOVE SURG SS PI 7.0 STRL IVOR (GLOVE) ×6 IMPLANT
GOWN STRL REUS W/ TWL LRG LVL3 (GOWN DISPOSABLE) ×3 IMPLANT
GOWN STRL REUS W/TWL LRG LVL3 (GOWN DISPOSABLE) ×3
KIT BASIN OR (CUSTOM PROCEDURE TRAY) ×2 IMPLANT
KIT ROOM TURNOVER OR (KITS) ×2 IMPLANT
LIQUID BAND (GAUZE/BANDAGES/DRESSINGS) ×2 IMPLANT
MARKER SKIN DUAL TIP RULER LAB (MISCELLANEOUS) ×2 IMPLANT
MESH VENTRALIGHT ST 6IN CRC (Mesh General) ×2 IMPLANT
NEEDLE SPNL 22GX3.5 QUINCKE BK (NEEDLE) ×2 IMPLANT
NS IRRIG 1000ML POUR BTL (IV SOLUTION) ×2 IMPLANT
PAD ARMBOARD 7.5X6 YLW CONV (MISCELLANEOUS) ×4 IMPLANT
RELOAD RELIATACK 10 (MISCELLANEOUS) IMPLANT
RELOAD RELIATACK 5 (MISCELLANEOUS) ×2 IMPLANT
SCALPEL HARMONIC ACE (MISCELLANEOUS) IMPLANT
SCISSORS LAP 5X35 DISP (ENDOMECHANICALS) IMPLANT
SET IRRIG TUBING LAPAROSCOPIC (IRRIGATION / IRRIGATOR) IMPLANT
SLEEVE ENDOPATH XCEL 5M (ENDOMECHANICALS) ×2 IMPLANT
SPONGE GAUZE 2X2 STER 10/PKG (GAUZE/BANDAGES/DRESSINGS) ×1
STRIP CLOSURE SKIN 1/2X4 (GAUZE/BANDAGES/DRESSINGS) ×2 IMPLANT
SUT MNCRL AB 4-0 PS2 18 (SUTURE) ×4 IMPLANT
SUT PROLENE 0 CT 1 CR/8 (SUTURE) ×2 IMPLANT
SUT VICRYL 0 UR6 27IN ABS (SUTURE) ×2 IMPLANT
TACKER 5MM HERNIA 3.5CML NAB (ENDOMECHANICALS) ×4 IMPLANT
TOWEL OR 17X24 6PK STRL BLUE (TOWEL DISPOSABLE) ×2 IMPLANT
TOWEL OR 17X26 10 PK STRL BLUE (TOWEL DISPOSABLE) ×2 IMPLANT
TRAY FOLEY CATH 14FRSI W/METER (CATHETERS) ×2 IMPLANT
TRAY LAPAROSCOPIC MC (CUSTOM PROCEDURE TRAY) ×2 IMPLANT
TROCAR XCEL BLUNT TIP 100MML (ENDOMECHANICALS) ×2 IMPLANT
TROCAR XCEL NON-BLD 11X100MML (ENDOMECHANICALS) ×2 IMPLANT
TROCAR XCEL NON-BLD 5MMX100MML (ENDOMECHANICALS) ×2 IMPLANT
TUBING INSUF HEATED (TUBING) ×2 IMPLANT
TUBING INSUFF HIGH FLOW RTP (TUBING) ×2 IMPLANT
WATER STERILE IRR 1000ML POUR (IV SOLUTION) IMPLANT

## 2015-02-01 NOTE — Anesthesia Postprocedure Evaluation (Signed)
Anesthesia Post Note  Patient: Spencer Good  Procedure(s) Performed: Procedure(s) (LRB): LAPAROSCOPIC UMBILICAL HERNIA REPAIR WITH MESH (N/A) INSERTION OF MESH (N/A)  Anesthesia type: General  Patient location: PACU  Post pain: Pain level controlled  Post assessment: Post-op Vital signs reviewed  Last Vitals: BP 145/89 mmHg  Pulse 98  Temp(Src) 36.4 C (Oral)  Resp 20  Ht 6' (1.829 m)  Wt 348 lb 12 oz (158.192 kg)  BMI 47.29 kg/m2  SpO2 96%  Post vital signs: Reviewed  Level of consciousness: sedated  Complications: No apparent anesthesia complications

## 2015-02-01 NOTE — Op Note (Addendum)
Preoperative diagnosis: Incarcerated umbilical hernia Postoperative diagnosis: Same as above Procedure: Laparoscopic repair of incarcerated umbilical hernia with 15 cm circular ventralight mesh Surgeon: Dr. Serita Grammes Anesthesia: Gen. Estimated blood loss: Minimal Specimens: None Drains: None Complications: None Sponge and needle count was correct at end of operation Disposition to recovery in stable condition  Indications: This is a 39 year old male who was admitted for diverticular disease. This has resolved with a couple rounds of antibiotics and we decided to monitor this conservatively. He also has a umbilical hernia that has incarcerated omentum and is becoming increasingly symptomatic. We discussed a laparoscopic repair due to his body habitus.  Procedure: After informed consent was obtained the patient was taken to the operating room. He was given 3 g of cefazolin. Sequential compression devices were on his legs. He was then placed under general anesthesia without complication. An orogastric tube and a Foley were placed. He was then prepped and draped in the standard sterile surgical fashion. Charlie Pitter was placed over his abdomen. A surgical timeout was then performed.  I infiltrated Marcaine in his left upper quadrant. I made a small incision. I then accessed his peritoneal cavity using a 5 mm Optiview trocar. There was no evidence of an entry injury. His abdomen was then insufflated to 15 mmHg pressure. I then inserted a 10 mm trocar in the left mid abdomen as well as a 5 mm trocar in the left lower quadrant. He had a fair amount of his omentum incarcerated in this hernia. I was able to reduce this. I did place a clip on one small vessel. Once I had done this the hernia was about 1 cm. I elected due to his body habitus to place a 15 cm circular mesh. I placed a #1 Prolene suture in each of the cardinal positions around the mesh. I then inserted the mesh into the 10 mm trocar. I laid this  flat. I used the Endo Close device to bring my sutures through the abdominal wall and tied these down putting the hernia in the middle of the mesh. I then used the Reliatack tacker to tack the edges. The mesh was in good position. Hemostasis was observed. I then removed my 10 mm trocar. I closed this with a 0 Vicryl and the Endo Close device. The remaining trocars were removed and the abdomen was desufflated. I then closed the incisions with 4-0 Monocryl. All of them were closed with glue. A binder was placed. He tolerated this well was extubated and transferred to the recovery room in stable condition.

## 2015-02-01 NOTE — Discharge Instructions (Signed)
[facility] LAPAROSCOPIC SURGERY: POST OP INSTRUCTIONS  Always review your discharge instruction sheet given to you by the facility where your surgery was performed. IF YOU HAVE DISABILITY OR FAMILY LEAVE FORMS, YOU MUST BRING THEM TO THE OFFICE FOR PROCESSING.   DO NOT GIVE THEM TO YOUR DOCTOR.  1. A prescription for pain medication may be given to you upon discharge.  Take your pain medication as prescribed, if needed.  If narcotic pain medicine is not needed, then you may take acetaminophen (Tylenol), naprosyn (Alleve), or ibuprofen (Advil) as needed. 2. Take your usually prescribed medications unless otherwise directed. 3. If you need a refill on your pain medication, please contact your pharmacy.  They will contact our office to request authorization. Prescriptions will not be filled after 5pm or on week-ends. 4. You should follow a light diet the first few days after arrival home, such as soup and crackers, etc.  Be sure to include lots of fluids daily. 5. Most patients will experience some swelling and bruising in the area of the incisions.  Ice packs will help.  Swelling and bruising can take several days to resolve.  6. It is common to experience some constipation if taking pain medication after surgery.  Increasing fluid intake and taking a stool softener (such as Colace) will usually help or prevent this problem from occurring.  A mild laxative (Milk of Magnesia or Miralax) should be taken according to package instructions if there are no bowel movements after 48 hours. 7. Unless discharge instructions indicate otherwise, you may remove your bandages 48 hours after surgery, and you may shower at that time.  You may have steri-strips (small skin tapes) in place directly over the incision.  These strips should be left on the skin for 7-10 days.  If your surgeon used skin glue on the incision, you may shower in 24 hours.  The glue will flake  off over the next 2-3 weeks.  Any sutures or staples will be removed at the office during your follow-up visit. 8. ACTIVITIES:  You may resume regular (light) daily activities beginning the next day--such as daily self-care, walking, climbing stairs--gradually increasing activities as tolerated.  You may have sexual intercourse when it is comfortable.  Refrain from any heavy lifting or straining until approved by your doctor. a. You may drive when you are no longer taking prescription pain medication, you can comfortably wear a seatbelt, and you can safely maneuver your car and apply brakes. b. RETURN TO WORK:  __________________________________________________________ 9. You should see your doctor in the office for a follow-up appointment approximately 2-3 weeks after your surgery.  Make sure that you call for this appointment within a day or two after you arrive home to insure a convenient appointment time. 10. OTHER INSTRUCTIONS: __________________________________________________________________________________________________________________________ __________________________________________________________________________________________________________________________ WHEN TO CALL YOUR DOCTOR: 1. Fever over 101.0 2. Inability to urinate 3. Continued bleeding from incision. 4. Increased pain, redness, or drainage from the incision. 5. Increasing abdominal pain  The clinic staff is available to answer your questions during regular business hours.  Please don't hesitate to call and ask to speak to one of the nurses for clinical concerns.  If you have a medical emergency, go to the nearest emergency room or call 911.  A surgeon from [facility] is always on call at the hospital. [address], [address], [place], [place]  [address] ? [address] [address], [place], [place]   [address] [phone] ? [phone] ? [phone] Web site: [email or web address]  

## 2015-02-01 NOTE — Anesthesia Procedure Notes (Signed)
Procedure Name: Intubation Date/Time: 02/01/2015 8:43 AM Performed by: [name], Jeannine Pennisi Pre-anesthesia Checklist: Patient identified, Patient being monitored, Timeout performed, Emergency Drugs available and Suction available Patient Re-evaluated:Patient Re-evaluated prior to inductionOxygen Delivery Method: Circle System Utilized Preoxygenation: Pre-oxygenation with 100% oxygen Intubation Type: IV induction Ventilation: Mask ventilation without difficulty Laryngoscope Size: Miller and 2 Grade View: Grade I Tube type: Oral Tube size: 7.0 mm Number of attempts: 1 Airway Equipment and Method: Stylet Placement Confirmation: ETT inserted through vocal cords under direct vision,  positive ETCO2 and breath sounds checked- equal and bilateral Secured at: 21 cm Tube secured with: Tape Dental Injury: Teeth and Oropharynx as per pre-operative assessment

## 2015-02-01 NOTE — Anesthesia Preprocedure Evaluation (Addendum)
Anesthesia Evaluation  Patient identified by MRN, date of birth, ID band Patient awake    Reviewed: Allergy & Precautions, NPO status   Airway Mallampati: II  TM Distance: >3 FB Neck ROM: Full    Dental  (+) Teeth Intact   Pulmonary Current Smoker,  breath sounds clear to auscultation        Cardiovascular Rhythm:Regular Rate:Normal     Neuro/Psych    GI/Hepatic   Endo/Other  diabetesMorbid obesity  Renal/GU      Musculoskeletal   Abdominal (+) + obese,   Peds  Hematology   Anesthesia Other Findings   Reproductive/Obstetrics                            Anesthesia Physical Anesthesia Plan  ASA: III  Anesthesia Plan: General   Post-op Pain Management:    Induction: Intravenous  Airway Management Planned: Oral ETT and Video Laryngoscope Planned  Additional Equipment:   Intra-op Plan:   Post-operative Plan: Extubation in OR  Informed Consent: I have reviewed the patients History and Physical, chart, labs and discussed the procedure including the risks, benefits and alternatives for the proposed anesthesia with the patient or authorized representative who has indicated his/her understanding and acceptance.   Dental advisory given  Plan Discussed with: CRNA and Surgeon  Anesthesia Plan Comments:        Anesthesia Quick Evaluation

## 2015-02-01 NOTE — Progress Notes (Signed)
assisted pt to stand at bedside attempted to void in urinal voided scant amt..placed back to recliner instead of bed.pt states more comfortable and breaqths better in recliner

## 2015-02-01 NOTE — Interval H&P Note (Signed)
History and Physical Interval Note:  02/01/2015 8:11 AM  Spencer Good  has presented today for surgery, with the diagnosis of UMBILICAL HERNIA  The various methods of treatment have been discussed with the patient and family. After consideration of risks, benefits and other options for treatment, the patient has consented to  Procedure(s): [place] (N/A) INSERTION OF MESH (N/A) as a surgical intervention .  The patient's history has been reviewed, patient examined, no change in status, stable for surgery.  I have reviewed the patient's chart and labs.  Questions were answered to the patient's satisfaction.     Kabria Hetzer

## 2015-02-01 NOTE — H&P (Signed)
28 yom who was admitted to [facility] for diverticulitis with contained perforation. He was in hospital from 5/8-5/10. he completed abx and still had some tenderness. had a ct repeated that showed some inflammation and was treated with an additional 2 week course of c/f. he now feels fine and having bms. underwent a colonoscopy with single polyp in cecum and moderate diverticulosis. this was tubular adenoma. He returns today to discuss his UH. this has been enlarging, never reduces and he is interested in having this fixed due to symptoms. he has quit smoking.   Other Problems Elbert Ewings, CMA; 01/13/2015 9:32 AM) Depression Diabetes Mellitus Diverticulosis Sleep Apnea Umbilical Hernia Repair  Past Surgical History Elbert Ewings, CMA; 01/13/2015 9:32 AM) Colon Polyp Removal - Colonoscopy  Diagnostic Studies History Elbert Ewings, CMA; 01/13/2015 9:32 AM) Colonoscopy within last year  Allergies Elbert Ewings, CMA; 01/13/2015 9:33 AM) No Known Drug Allergies07/29/2016  Medication History Elbert Ewings, CMA; 01/13/2015 9:34 AM) Chantix (0.5MG  Tablet, Oral) Active. Zaleplon (10MG  Capsule, Oral) Active. Vyvanse (50MG  Capsule, Oral) Active. OneTouch Ultra Blue (In Vitro) Active. Flonase (50MCG/ACT Suspension, Nasal) Active. Medications Reconciled  Social History Elbert Ewings, [place]; 01/13/2015 9:32 AM) Alcohol use Occasional alcohol use. Caffeine use Carbonated beverages, Tea. Illicit drug use Remotely quit drug use. Tobacco use Former smoker.  Family History Elbert Ewings, [place]; 01/13/2015 9:32 AM) Alcohol Abuse Father. Cancer Family Members In General. Cerebrovascular Accident Father. Hypertension Father, Mother.  Review of Systems Elbert Ewings CMA; 01/13/2015 9:32 AM) General Not Present- Appetite Loss, Chills, Fatigue, Fever, Night Sweats, Weight Gain and Weight Loss. Skin Not Present- Change in Wart/Mole, Dryness, Hives, Jaundice, New Lesions, Non-Healing Wounds,  Rash and Ulcer. HEENT Not Present- Earache, Hearing Loss, Hoarseness, Nose Bleed, Oral Ulcers, Ringing in the Ears, Seasonal Allergies, Sinus Pain, Sore Throat, Visual Disturbances, Wears glasses/contact lenses and Yellow Eyes. Respiratory Present- Snoring. Not Present- Bloody sputum, Chronic Cough, Difficulty Breathing and Wheezing. Breast Not Present- Breast Mass, Breast Pain, Nipple Discharge and Skin Changes. Cardiovascular Not Present- Chest Pain, Difficulty Breathing Lying Down, Leg Cramps, Palpitations, Rapid Heart Rate, Shortness of Breath and Swelling of Extremities. Gastrointestinal Not Present- Abdominal Pain, Bloating, Bloody Stool, Change in Bowel Habits, Chronic diarrhea, Constipation, Difficulty Swallowing, Excessive gas, Gets full quickly at meals, Hemorrhoids, Indigestion, Nausea, Rectal Pain and Vomiting. Male Genitourinary Not Present- Blood in Urine, Change in Urinary Stream, Frequency, Impotence, Nocturia, Painful Urination, Urgency and Urine Leakage. Musculoskeletal Not Present- Back Pain, Joint Pain, Joint Stiffness, Muscle Pain, Muscle Weakness and Swelling of Extremities. Neurological Not Present- Decreased Memory, Fainting, Headaches, Numbness, Seizures, Tingling, Tremor, Trouble walking and Weakness. Psychiatric Not Present- Anxiety, Bipolar, Change in Sleep Pattern, Depression, Fearful and Frequent crying. Endocrine Not Present- Cold Intolerance, Excessive Hunger, Hair Changes, Heat Intolerance, Hot flashes and New Diabetes. Hematology Not Present- Easy Bruising, Excessive bleeding, Gland problems, HIV and Persistent Infections.   Vitals Elbert Ewings CMA; 01/13/2015 9:34 AM) 01/13/2015 9:34 AM Weight: 341 lb Height: 72in Body Surface Area: 2.8 m Body Mass Index: 46.25 kg/m Temp.: 98.49F(Oral)  Pulse: 92 (Regular)  BP: 152/82 (Sitting, Left Arm, Standard)    Physical Exam Rolm Bookbinder MD; 01/13/2015 2:30 PM) General Mental  Status-Alert. Orientation-Oriented X3.  Chest and Lung Exam Chest and lung exam reveals -on auscultation, normal breath sounds, no adventitious sounds and normal vocal resonance.  Cardiovascular Cardiovascular examination reveals -normal heart sounds, regular rate and rhythm with no murmurs.  Abdomen Note: obese soft incarcerated (preperitoneal fat) uh that is mildly tender  Assessment & Plan (  Rolm Bookbinder MD; 01/13/2015 2:32 PM) HERNIA, UMBILICAL (034.9  Y11.9) Story: Laparoscopic umbilical hernia repair with mesh He has quit smoking. Ideally weight loss beforehand would be ideal but this is incarcerated now and symptomatic. I dont think he needs colectomy at this point and we discussed that today. We discussed risks/recovery of a laparoscopic uh repair and rationale for mesh. Will plan on doing this soon. Understands specifically there is recurrence risk.

## 2015-02-01 NOTE — Transfer of Care (Signed)
Immediate Anesthesia Transfer of Care Note  Patient: Spencer Good  Procedure(s) Performed: Procedure(s): LAPAROSCOPIC UMBILICAL HERNIA REPAIR WITH MESH (N/A) INSERTION OF MESH (N/A)  Patient Location: PACU  Anesthesia Type:General  Level of Consciousness: awake, alert , oriented, patient cooperative and responds to stimulation  Airway & Oxygen Therapy: Patient Spontanous Breathing and Patient connected to nasal cannula oxygen  Post-op Assessment: Report given to RN, Post -op Vital signs reviewed and stable and Patient moving all extremities X 4  Post vital signs: Reviewed  Last Vitals:  Filed Vitals:   02/01/15 0708  BP: 154/102  Pulse: 94  Temp: 36.7 C  Resp: 20    Complications: No apparent anesthesia complications

## 2015-02-01 NOTE — Telephone Encounter (Signed)
pre visit letter mailed 01/26/15 °

## 2015-02-01 NOTE — Progress Notes (Signed)
Report given to sahron rn as caregiver

## 2015-02-02 ENCOUNTER — Encounter (HOSPITAL_COMMUNITY): Payer: Self-pay | Admitting: General Surgery

## 2015-02-02 DIAGNOSIS — K420 Umbilical hernia with obstruction, without gangrene: Secondary | ICD-10-CM | POA: Diagnosis not present

## 2015-02-02 LAB — GLUCOSE, CAPILLARY: Glucose-Capillary: 135 mg/dL — ABNORMAL HIGH (ref 65–99)

## 2015-02-02 MED ORDER — OXYCODONE HCL 5 MG PO TABS: 5.0000 mg | ORAL_TABLET | ORAL | Status: DC | PRN

## 2015-02-02 NOTE — Progress Notes (Signed)
Patient discharged to home with instructions. 

## 2015-02-02 NOTE — Discharge Summary (Signed)
Physician Discharge Summary  Patient ID: Spencer Good MRN: [id number] DOB/AGE: 37-Jan-1977 37 y.o.  Admit date: 02/01/2015 Discharge date: 02/02/2015  Admission Diagnoses: Incarcerated uh History diverticulitis Diabetes mellitus  Discharge Diagnoses:  Active Problems:   Umbilical hernia, incarcerated   Discharged Condition: good  Hospital Course: 39 yom who I know from episode of diverticulitis which has resolved after two rounds of abx.  He also has chronically incarcerated UH that is becoming increasingly symptomatic.  We did a laparoscopic repair with mesh.  The following day he is tolerating a regular diet, voiding, ambulating and pain is controlled. Will plan on dc home.   Consults: None  Significant Diagnostic Studies: none  Treatments: surgery: as above  Discharge Exam: Blood pressure 137/90, pulse 96, temperature 97.7 F (36.5 C), temperature source Oral, resp. rate 17, height 6' (1.829 m), weight 158.192 kg (348 lb 12 oz), SpO2 97 %. abdomen soft approp tender incisions clean  Disposition: 01-Home or Self Care     Medication List    TAKE these medications        ALIGN 4 MG Caps  Take 1 capsule by mouth daily.     fluticasone 50 MCG/ACT nasal spray  Commonly known as:  FLONASE  Place 1 spray into both nostrils as needed.     glucose blood test strip  Commonly known as:  ONE TOUCH ULTRA TEST  Check blood sugars no more than twice daily.     multivitamin tablet  Take 1 tablet by mouth daily. Vegan Multi-Pak     oxyCODONE 5 MG immediate release tablet  Commonly known as:  Oxy IR/ROXICODONE  Take 1-2 tablets (5-10 mg total) by mouth every 4 (four) hours as needed for moderate pain.     varenicline 0.5 MG tablet  Commonly known as:  CHANTIX  Take 1 tablet (0.5 mg total) by mouth 2 (two) times daily.     VYVANSE 50 MG capsule  Generic drug:  lisdexamfetamine  Take 50 mg by mouth daily. Per Dr Karl Luke     zaleplon 10 MG capsule  Commonly known as:   SONATA  Take 10 mg by mouth at bedtime as needed for sleep.           Follow-up Information    Follow up with [facility], MD In 3 weeks.   Specialty:  General Surgery   Contact information:   [address] [place] [address] [phone]       Signed: Rolm Bookbinder 02/02/2015, 7:31 AM

## 2015-02-16 ENCOUNTER — Encounter: Payer: BLUE CROSS/BLUE SHIELD | Admitting: Internal Medicine

## 2015-05-09 ENCOUNTER — Ambulatory Visit: Payer: Self-pay | Admitting: Internal Medicine

## 2015-09-06 ENCOUNTER — Ambulatory Visit (INDEPENDENT_AMBULATORY_CARE_PROVIDER_SITE_OTHER): Payer: BLUE CROSS/BLUE SHIELD | Admitting: Internal Medicine

## 2015-09-06 ENCOUNTER — Encounter: Payer: Self-pay | Admitting: Internal Medicine

## 2015-09-06 ENCOUNTER — Inpatient Hospital Stay (HOSPITAL_COMMUNITY)
Admission: EM | Admit: 2015-09-06 | Discharge: 2015-09-15 | DRG: 378 | Disposition: A | Discharge status: Home / Self Care | Payer: BLUE CROSS/BLUE SHIELD | Attending: Internal Medicine | Admitting: Internal Medicine

## 2015-09-06 ENCOUNTER — Encounter (HOSPITAL_COMMUNITY): Payer: Self-pay | Admitting: *Deleted

## 2015-09-06 ENCOUNTER — Emergency Department (HOSPITAL_COMMUNITY): Payer: BLUE CROSS/BLUE SHIELD

## 2015-09-06 VITALS — BP 144/84 | HR 133 | Temp 100.4°F | Ht 71.25 in | Wt 340.0 lb

## 2015-09-06 DIAGNOSIS — E876 Hypokalemia: Secondary | ICD-10-CM | POA: Diagnosis not present

## 2015-09-06 DIAGNOSIS — Z794 Long term (current) use of insulin: Secondary | ICD-10-CM | POA: Diagnosis not present

## 2015-09-06 DIAGNOSIS — K922 Gastrointestinal hemorrhage, unspecified: Secondary | ICD-10-CM | POA: Diagnosis not present

## 2015-09-06 DIAGNOSIS — E872 Acidosis, unspecified: Secondary | ICD-10-CM

## 2015-09-06 DIAGNOSIS — Z86010 Personal history of colonic polyps: Secondary | ICD-10-CM | POA: Diagnosis not present

## 2015-09-06 DIAGNOSIS — K5792 Diverticulitis of intestine, part unspecified, without perforation or abscess without bleeding: Secondary | ICD-10-CM

## 2015-09-06 DIAGNOSIS — K651 Peritoneal abscess: Secondary | ICD-10-CM

## 2015-09-06 DIAGNOSIS — K625 Hemorrhage of anus and rectum: Secondary | ICD-10-CM | POA: Diagnosis not present

## 2015-09-06 DIAGNOSIS — E1165 Type 2 diabetes mellitus with hyperglycemia: Secondary | ICD-10-CM | POA: Diagnosis present

## 2015-09-06 DIAGNOSIS — K5720 Diverticulitis of large intestine with perforation and abscess without bleeding: Secondary | ICD-10-CM | POA: Diagnosis not present

## 2015-09-06 DIAGNOSIS — E871 Hypo-osmolality and hyponatremia: Secondary | ICD-10-CM | POA: Diagnosis present

## 2015-09-06 DIAGNOSIS — E875 Hyperkalemia: Secondary | ICD-10-CM

## 2015-09-06 DIAGNOSIS — F17210 Nicotine dependence, cigarettes, uncomplicated: Secondary | ICD-10-CM | POA: Diagnosis present

## 2015-09-06 DIAGNOSIS — K5733 Diverticulitis of large intestine without perforation or abscess with bleeding: Secondary | ICD-10-CM | POA: Diagnosis not present

## 2015-09-06 DIAGNOSIS — G4700 Insomnia, unspecified: Secondary | ICD-10-CM | POA: Diagnosis present

## 2015-09-06 DIAGNOSIS — E669 Obesity, unspecified: Secondary | ICD-10-CM | POA: Diagnosis present

## 2015-09-06 DIAGNOSIS — G4733 Obstructive sleep apnea (adult) (pediatric): Secondary | ICD-10-CM

## 2015-09-06 DIAGNOSIS — R1031 Right lower quadrant pain: Secondary | ICD-10-CM | POA: Diagnosis not present

## 2015-09-06 DIAGNOSIS — Z9114 Patient's other noncompliance with medication regimen: Secondary | ICD-10-CM

## 2015-09-06 DIAGNOSIS — K5732 Diverticulitis of large intestine without perforation or abscess without bleeding: Secondary | ICD-10-CM | POA: Diagnosis not present

## 2015-09-06 DIAGNOSIS — F909 Attention-deficit hyperactivity disorder, unspecified type: Secondary | ICD-10-CM | POA: Diagnosis present

## 2015-09-06 DIAGNOSIS — R630 Anorexia: Secondary | ICD-10-CM | POA: Diagnosis present

## 2015-09-06 DIAGNOSIS — E860 Dehydration: Secondary | ICD-10-CM

## 2015-09-06 DIAGNOSIS — K5721 Diverticulitis of large intestine with perforation and abscess with bleeding: Principal | ICD-10-CM | POA: Diagnosis present

## 2015-09-06 DIAGNOSIS — Z6841 Body Mass Index (BMI) 40.0 and over, adult: Secondary | ICD-10-CM | POA: Diagnosis not present

## 2015-09-06 DIAGNOSIS — R109 Unspecified abdominal pain: Secondary | ICD-10-CM | POA: Diagnosis present

## 2015-09-06 DIAGNOSIS — N5089 Other specified disorders of the male genital organs: Secondary | ICD-10-CM | POA: Diagnosis not present

## 2015-09-06 DIAGNOSIS — E119 Type 2 diabetes mellitus without complications: Secondary | ICD-10-CM

## 2015-09-06 DIAGNOSIS — Z09 Encounter for follow-up examination after completed treatment for conditions other than malignant neoplasm: Secondary | ICD-10-CM

## 2015-09-06 DIAGNOSIS — D62 Acute posthemorrhagic anemia: Secondary | ICD-10-CM | POA: Diagnosis not present

## 2015-09-06 LAB — COMPREHENSIVE METABOLIC PANEL
ALT: 52 U/L (ref 17–63)
AST: 98 U/L — ABNORMAL HIGH (ref 15–41)
Albumin: 3.8 g/dL (ref 3.5–5.0)
Alkaline Phosphatase: 95 U/L (ref 38–126)
Anion gap: 14 (ref 5–15)
BUN: 7 mg/dL (ref 6–20)
CO2: 15 mmol/L — ABNORMAL LOW (ref 22–32)
Calcium: 8.8 mg/dL — ABNORMAL LOW (ref 8.9–10.3)
Chloride: 100 mmol/L — ABNORMAL LOW (ref 101–111)
Creatinine, Ser: 0.92 mg/dL (ref 0.61–1.24)
GFR calc Af Amer: >60 mL/min (ref >60)
GFR calc non Af Amer: >60 mL/min (ref >60)
Glucose, Bld: 441 mg/dL — ABNORMAL HIGH (ref 65–99)
Potassium: 6.9 mmol/L (ref 3.5–5.1)
Sodium: 129 mmol/L — ABNORMAL LOW (ref 135–145)
Total Bilirubin: 4.2 mg/dL — ABNORMAL HIGH (ref 0.3–1.2)
Total Protein: 7.2 g/dL (ref 6.5–8.1)

## 2015-09-06 LAB — BASIC METABOLIC PANEL
Anion gap: 12 (ref 5–15)
BUN: 7 mg/dL (ref 6–20)
CO2: 15 mmol/L — ABNORMAL LOW (ref 22–32)
Calcium: 8.4 mg/dL — ABNORMAL LOW (ref 8.9–10.3)
Chloride: 101 mmol/L (ref 101–111)
Creatinine, Ser: 0.88 mg/dL (ref 0.61–1.24)
GFR calc Af Amer: >60 mL/min (ref >60)
GFR calc non Af Amer: >60 mL/min (ref >60)
Glucose, Bld: 334 mg/dL — ABNORMAL HIGH (ref 65–99)
Potassium: >7.5 mmol/L (ref 3.5–5.1)
Sodium: 128 mmol/L — ABNORMAL LOW (ref 135–145)

## 2015-09-06 LAB — URINALYSIS, ROUTINE W REFLEX MICROSCOPIC
Bilirubin Urine: NEGATIVE
Glucose, UA: >1000 mg/dL — AB
Hgb urine dipstick: NEGATIVE
Ketones, ur: >80 mg/dL — AB
Leukocytes, UA: NEGATIVE
Nitrite: NEGATIVE
Protein, ur: NEGATIVE mg/dL
Specific Gravity, Urine: >1.046 — ABNORMAL HIGH (ref 1.005–1.030)
pH: 5.5 (ref 5.0–8.0)

## 2015-09-06 LAB — URINE MICROSCOPIC-ADD ON: WBC, UA: NONE SEEN WBC/hpf (ref 0–5)

## 2015-09-06 LAB — CBC
HCT: 42.5 % (ref 39.0–52.0)
Hemoglobin: 15.4 g/dL (ref 13.0–17.0)
MCH: 29.8 pg (ref 26.0–34.0)
MCHC: 36.2 g/dL — ABNORMAL HIGH (ref 30.0–36.0)
MCV: 82.4 fL (ref 78.0–100.0)
Platelets: 179 10*3/uL (ref 150–400)
RBC: 5.16 MIL/uL (ref 4.22–5.81)
RDW: 15.4 % (ref 11.5–15.5)
WBC: 14.6 10*3/uL — ABNORMAL HIGH (ref 4.0–10.5)

## 2015-09-06 LAB — SODIUM, URINE, RANDOM: Sodium, Ur: 23 mmol/L

## 2015-09-06 LAB — I-STAT CG4 LACTIC ACID, ED
Lactic Acid, Venous: 1.99 mmol/L (ref 0.5–2.0)
Lactic Acid, Venous: 1.28 mmol/L (ref 0.5–2.0)

## 2015-09-06 LAB — CREATININE, URINE, RANDOM: Creatinine, Urine: 94.61 mg/dL

## 2015-09-06 LAB — CBG MONITORING, ED: Glucose-Capillary: 268 mg/dL — ABNORMAL HIGH (ref 65–99)

## 2015-09-06 LAB — LIPASE, BLOOD: Lipase: 18 U/L (ref 11–51)

## 2015-09-06 LAB — OSMOLALITY, URINE: Osmolality, Ur: 933 mOsm/kg — ABNORMAL HIGH (ref 300–900)

## 2015-09-06 MED ORDER — MORPHINE SULFATE (PF) 4 MG/ML IV SOLN
4.0000 mg | INTRAVENOUS | Status: DC | PRN
  Administered 2015-09-07 – 2015-09-10 (×8): 4 mg via INTRAVENOUS
  Filled 2015-09-06 (×8): qty 1

## 2015-09-06 MED ORDER — HYDROMORPHONE HCL 1 MG/ML IJ SOLN
1.0000 mg | Freq: Once | INTRAMUSCULAR | Status: AC
  Administered 2015-09-06: 1 mg via INTRAVENOUS
  Filled 2015-09-06: qty 1

## 2015-09-06 MED ORDER — IOHEXOL 300 MG/ML  SOLN
100.0000 mL | Freq: Once | INTRAMUSCULAR | Status: AC | PRN
  Administered 2015-09-06: 100 mL via INTRAVENOUS

## 2015-09-06 MED ORDER — ONDANSETRON HCL 4 MG/2ML IJ SOLN
4.0000 mg | Freq: Four times a day (QID) | INTRAMUSCULAR | Status: DC | PRN
  Administered 2015-09-07: 4 mg via INTRAVENOUS
  Filled 2015-09-06: qty 2

## 2015-09-06 MED ORDER — FENTANYL CITRATE (PF) 100 MCG/2ML IJ SOLN
50.0000 ug | INTRAMUSCULAR | Status: AC | PRN
  Administered 2015-09-06 (×2): 50 ug via NASAL
  Filled 2015-09-06: qty 2

## 2015-09-06 MED ORDER — FENTANYL CITRATE (PF) 100 MCG/2ML IJ SOLN
INTRAMUSCULAR | Status: AC
  Filled 2015-09-06: qty 2

## 2015-09-06 MED ORDER — SODIUM CHLORIDE 0.9 % IV BOLUS (SEPSIS)
1000.0000 mL | Freq: Once | INTRAVENOUS | Status: AC
  Administered 2015-09-06: 1000 mL via INTRAVENOUS

## 2015-09-06 MED ORDER — HYDROCODONE-ACETAMINOPHEN 5-325 MG PO TABS
1.0000 | ORAL_TABLET | ORAL | Status: DC | PRN
  Administered 2015-09-07 – 2015-09-10 (×12): 2 via ORAL
  Administered 2015-09-10: 1 via ORAL
  Administered 2015-09-10 – 2015-09-15 (×21): 2 via ORAL
  Filled 2015-09-06 (×34): qty 2

## 2015-09-06 MED ORDER — INSULIN ASPART 100 UNIT/ML ~~LOC~~ SOLN
0.0000 [IU] | SUBCUTANEOUS | Status: DC
  Administered 2015-09-07: 3 [IU] via SUBCUTANEOUS
  Administered 2015-09-07: 2 [IU] via SUBCUTANEOUS
  Administered 2015-09-07 (×2): 5 [IU] via SUBCUTANEOUS
  Administered 2015-09-07 (×2): 3 [IU] via SUBCUTANEOUS
  Administered 2015-09-08 (×6): 2 [IU] via SUBCUTANEOUS
  Administered 2015-09-08: 3 [IU] via SUBCUTANEOUS
  Administered 2015-09-09 (×2): 2 [IU] via SUBCUTANEOUS
  Administered 2015-09-09: 1 [IU] via SUBCUTANEOUS
  Administered 2015-09-09 – 2015-09-11 (×11): 2 [IU] via SUBCUTANEOUS

## 2015-09-06 MED ORDER — SODIUM CHLORIDE 0.9 % IV SOLN
INTRAVENOUS | Status: AC
  Administered 2015-09-06: via INTRAVENOUS

## 2015-09-06 MED ORDER — CIPROFLOXACIN IN D5W 400 MG/200ML IV SOLN
400.0000 mg | Freq: Two times a day (BID) | INTRAVENOUS | Status: DC
  Filled 2015-09-06: qty 200

## 2015-09-06 MED ORDER — MORPHINE SULFATE (PF) 4 MG/ML IV SOLN
4.0000 mg | INTRAVENOUS | Status: DC | PRN
  Administered 2015-09-06: 4 mg via INTRAVENOUS
  Filled 2015-09-06: qty 1

## 2015-09-06 MED ORDER — CIPROFLOXACIN IN D5W 400 MG/200ML IV SOLN
400.0000 mg | Freq: Once | INTRAVENOUS | Status: AC
  Administered 2015-09-06: 400 mg via INTRAVENOUS
  Filled 2015-09-06: qty 200

## 2015-09-06 MED ORDER — METRONIDAZOLE IN NACL 5-0.79 MG/ML-% IV SOLN
500.0000 mg | Freq: Three times a day (TID) | INTRAVENOUS | Status: DC
  Administered 2015-09-07 – 2015-09-09 (×7): 500 mg via INTRAVENOUS
  Filled 2015-09-06 (×8): qty 100

## 2015-09-06 MED ORDER — ACETAMINOPHEN 325 MG PO TABS: 650.0000 mg | ORAL_TABLET | Freq: Once | ORAL | Status: DC

## 2015-09-06 MED ORDER — METRONIDAZOLE IN NACL 5-0.79 MG/ML-% IV SOLN
500.0000 mg | Freq: Once | INTRAVENOUS | Status: AC
  Administered 2015-09-06: 500 mg via INTRAVENOUS
  Filled 2015-09-06: qty 100

## 2015-09-06 MED ORDER — SODIUM BICARBONATE 8.4 % IV SOLN
50.0000 meq | Freq: Once | INTRAVENOUS | Status: AC
  Administered 2015-09-06: 50 meq via INTRAVENOUS
  Filled 2015-09-06: qty 50

## 2015-09-06 MED ORDER — ACETAMINOPHEN 325 MG PO TABS
ORAL_TABLET | ORAL | Status: AC
  Filled 2015-09-06: qty 2

## 2015-09-06 MED ORDER — ACETAMINOPHEN 325 MG PO TABS
650.0000 mg | ORAL_TABLET | Freq: Once | ORAL | Status: AC | PRN
  Administered 2015-09-06: 650 mg via ORAL

## 2015-09-06 MED ORDER — ONDANSETRON HCL 4 MG PO TABS: 4.0000 mg | ORAL_TABLET | Freq: Four times a day (QID) | ORAL | Status: DC | PRN

## 2015-09-06 MED ORDER — LORAZEPAM 2 MG/ML IJ SOLN
1.0000 mg | Freq: Once | INTRAMUSCULAR | Status: AC | PRN
  Administered 2015-09-06: 1 mg via INTRAVENOUS
  Filled 2015-09-06: qty 1

## 2015-09-06 MED ORDER — SODIUM CHLORIDE 0.9% FLUSH
3.0000 mL | Freq: Two times a day (BID) | INTRAVENOUS | Status: DC
  Administered 2015-09-11 – 2015-09-14 (×2): 3 mL via INTRAVENOUS

## 2015-09-06 MED ORDER — INSULIN ASPART 100 UNIT/ML ~~LOC~~ SOLN
10.0000 [IU] | Freq: Once | SUBCUTANEOUS | Status: DC
  Filled 2015-09-06: qty 1

## 2015-09-06 MED ORDER — ACETAMINOPHEN 650 MG RE SUPP: 650.0000 mg | Freq: Four times a day (QID) | RECTAL | Status: DC | PRN

## 2015-09-06 MED ORDER — SODIUM CHLORIDE 0.9 % IV SOLN
1.0000 g | Freq: Once | INTRAVENOUS | Status: AC
  Administered 2015-09-06: 1 g via INTRAVENOUS
  Filled 2015-09-06: qty 10

## 2015-09-06 MED ORDER — ZOLPIDEM TARTRATE 5 MG PO TABS: 5.0000 mg | ORAL_TABLET | Freq: Once | ORAL | Status: DC

## 2015-09-06 MED ORDER — ACETAMINOPHEN 325 MG PO TABS: 650.0000 mg | ORAL_TABLET | Freq: Four times a day (QID) | ORAL | Status: DC | PRN

## 2015-09-06 NOTE — ED Notes (Signed)
Attempted report 

## 2015-09-06 NOTE — Patient Instructions (Signed)
Please go to the ER at [facility] now, I already spoke with the ER doctor, they will have your records

## 2015-09-06 NOTE — H&P (Addendum)
PCP: Kathlene November, MD  [facility]  Referring provider [place]   Chief Complaint: Abdominal pain  HPI: Spencer Good is a 40 y.o. male   has a past medical history of Gout; ADHD (attention deficit hyperactivity disorder); OSA (obstructive sleep apnea) (dx 05-2013); Diverticulitis; Hernia, umbilical ([id number]); Insomnia; History of colon polyps; Diabetes ([place]); and Obesity.   Presented with right flank pain for the past 2 days patient has been having waxing and waning pain.  This is similar to his prior symptoms of the diverticulitis but worse. He's been having occasional minor blood per rectum it was on the surface of the stool not enough to change the water color.  Had a bowel movement this morning. Have not had any vomiting although somewhat nauseous report some  fevers associated with this.  IN ER: Was noted to have sodium of 128 potassium initially above 7.5 although thought to be phenolized Bicarbonate 15 anion gap 12. Lactic acid within normal limits ketones in urine glucose 334. WBC elevated 14.6  CT of abdomen was done showing persistent recurrent diverticulitis involving sigmoid colon cannot rule out thumb x-ray changes to adjacent terminal ileum and appendix  Spoke to Dr. Georgette Dover recommend conservative management with antibiotics for now. Will see patient in consult He was started on Cipro and Flagyl Given hyperkalemia he was given calcium gluconate insulin 10 units and bicarbonate Regarding pertinent past history: In May patient was admitted for diverticulitis with contained perforation  patient has history of chronically incarcerated umbilical hernia has been increasingly symptomatic status post laparoscopic repair with mesh in August. Has hx of DM but has not been checking his BG or taking any medications for it states diet controlled.  Has sleep apenia but not on CPAP  Hospitalist was called for admission for diverticulitis and  hyperkalemia  Review of Systems:    Pertinent positives include: Fevers, abdominal pain, nausea, blood in stool,  Constitutional:  No weight loss, night sweats,  chills, fatigue, weight loss  HEENT:  No headaches, Difficulty swallowing,Tooth/dental problems,Sore throat,  No sneezing, itching, ear ache, nasal congestion, post nasal drip,  Cardio-vascular:  No chest pain, Orthopnea, PND, anasarca, dizziness, palpitations.no Bilateral lower extremity swelling  GI:  No heartburn, indigestion, vomiting, diarrhea, change in bowel habits, loss of appetite, melena,  hematemesis Resp:  no shortness of breath at rest. No dyspnea on exertion, No excess mucus, no productive cough, No non-productive cough, No coughing up of blood.No change in color of mucus.No wheezing. Skin:  no rash or lesions. No jaundice GU:  no dysuria, change in color of urine, no urgency or frequency. No straining to urinate.  No flank pain.  Musculoskeletal:  No joint pain or no joint swelling. No decreased range of motion. No back pain.  Psych:  No change in mood or affect. No depression or anxiety. No memory loss.  Neuro: no localizing neurological complaints, no tingling, no weakness, no double vision, no gait abnormality, no slurred speech, no confusion  Otherwise ROS are negative except for above, 10 systems were reviewed  Past Medical History: Past Medical History  Diagnosis Date  . Gout     doesn't take any meds   . ADHD (attention deficit hyperactivity disorder)     takes Vyvanse daily  . OSA (obstructive sleep apnea) dx 05-2013    Rx a Cpap 3-15, can't use   . Diverticulitis     takes Electronics engineer daily  . Hernia, umbilical [id number]    Laparoscopic umbilical  hernia repair with mesh  . Insomnia     takes Sonata nightly as needed  . History of colon polyps     precancerous  . Diabetes ([place])     but doesn't take any meds  . Obesity    Past Surgical History  Procedure Laterality Date  . Colonoscopy    .  Lasik    . Umbilical hernia repair  02/01/2015    with mesh  . Umbilical hernia repair N/A 02/01/2015    Procedure: LAPAROSCOPIC UMBILICAL HERNIA REPAIR WITH MESH;  Surgeon: Rolm Bookbinder, MD;  Location: [place];  Service: General;  Laterality: N/A;  . Insertion of mesh N/A 02/01/2015    Procedure: INSERTION OF MESH;  Surgeon: Rolm Bookbinder, MD;  Location: [place];  Service: General;  Laterality: N/A;     Medications: Prior to Admission medications   Medication Sig Start Date End Date Taking? Authorizing Provider  Multiple Vitamin (MULTIVITAMIN) tablet Take 1 tablet by mouth daily. Vegan Multi-Pak   Yes Historical Provider, MD  oxyCODONE (OXY IR/ROXICODONE) 5 MG immediate release tablet Take 1-2 tablets (5-10 mg total) by mouth every 4 (four) hours as needed for moderate pain. 02/02/15  Yes Rolm Bookbinder, MD  glucose blood (ONE TOUCH ULTRA TEST) test strip Check blood sugars no more than twice daily. Patient not taking: Reported on 09/06/2015 11/15/14   Colon Branch, MD    Allergies:  No Known Allergies  Social History:  Ambulatory  Independently  Lives at home alone      reports that he has been smoking Cigarettes.  He started smoking about 23 years ago. He has a 20 pack-year smoking history. He has never used smokeless tobacco. He reports that he does not drink alcohol or use illicit drugs.    Family History: family history includes CAD in his other; Heart disease in his maternal grandfather; Stroke in his father. There is no history of Colon cancer, Prostate cancer, Diabetes, Colon polyps, Esophageal cancer, Gallbladder disease, or Kidney disease.    Physical Exam: Patient Vitals for the past 24 hrs:  BP Temp Temp src Pulse Resp SpO2  09/06/15 1800 133/88 mmHg - - (!) 124 21 95 %  09/06/15 1748 - 98.1 F (36.7 C) Oral - - -  09/06/15 1745 139/90 mmHg - - 118 (!) 28 93 %  09/06/15 1730 136/81 mmHg - - (!) 124 (!) 34 94 %  09/06/15 1715 115/71 mmHg - - 118 18 94 %   09/06/15 1700 128/92 mmHg - - (!) 125 19 95 %  09/06/15 1645 136/91 mmHg - - (!) 123 20 96 %  09/06/15 1556 138/88 mmHg 100.4 F (38 C) Oral (!) 125 20 95 %    1. General:  in No Acute distress 2. Psychological: Alert and  Oriented 3. Head/ENT:    Dry Mucous Membranes                          Head Non traumatic, neck supple                          Normal   Dentition 4. SKIN:   decreased Skin turgor,  Skin clean Dry and intact no rash 5. Heart: Regular rate and rhythm no Murmur, Rub or gallop 6. Lungs: no wheezes or crackles  distant 7. Abdomen: Soft, RLQ tenderness, Non distended, obese 8. Lower extremities: no clubbing, cyanosis, or edema 9. Neurologically Grossly  intact, moving all 4 extremities equally 10. MSK: Normal range of motion  body mass index is unknown because there is no weight on file.   Labs on Admission:   Results for orders placed or performed during the hospital encounter of 09/06/15 (from the past 24 hour(s))  Lipase, blood     Status: None   Collection Time: 09/06/15  3:55 PM  Result Value Ref Range   Lipase 18 11 - 51 U/L  Comprehensive metabolic panel     Status: Abnormal   Collection Time: 09/06/15  3:55 PM  Result Value Ref Range   Sodium 129 (L) 135 - 145 mmol/L   Potassium 6.9 (HH) 3.5 - 5.1 mmol/L   Chloride 100 (L) 101 - 111 mmol/L   CO2 15 (L) 22 - 32 mmol/L   Glucose, Bld 441 (H) 65 - 99 mg/dL   BUN 7 6 - 20 mg/dL   Creatinine, Ser 0.92 0.61 - 1.24 mg/dL   Calcium 8.8 (L) 8.9 - 10.3 mg/dL   Total Protein 7.2 6.5 - 8.1 g/dL   Albumin 3.8 3.5 - 5.0 g/dL   AST 98 (H) 15 - 41 U/L   ALT 52 17 - 63 U/L   Alkaline Phosphatase 95 38 - 126 U/L   Total Bilirubin 4.2 (H) 0.3 - 1.2 mg/dL   GFR calc non Af Amer >60 >60 mL/min   GFR calc Af Amer >60 >60 mL/min   Anion gap 14 5 - 15  CBC     Status: Abnormal   Collection Time: 09/06/15  3:55 PM  Result Value Ref Range   WBC 14.6 (H) 4.0 - 10.5 K/uL   RBC 5.16 4.22 - 5.81 MIL/uL   Hemoglobin 15.4  13.0 - 17.0 g/dL   HCT 42.5 39.0 - 52.0 %   MCV 82.4 78.0 - 100.0 fL   MCH 29.8 26.0 - 34.0 pg   MCHC 36.2 (H) 30.0 - 36.0 g/dL   RDW 15.4 11.5 - 15.5 %   Platelets 179 150 - 400 K/uL  I-Stat CG4 Lactic Acid, ED     Status: None   Collection Time: 09/06/15  4:05 PM  Result Value Ref Range   Lactic Acid, Venous 1.99 0.5 - 2.0 mmol/L  Basic metabolic panel     Status: Abnormal   Collection Time: 09/06/15  6:16 PM  Result Value Ref Range   Sodium 128 (L) 135 - 145 mmol/L   Potassium >7.5 (HH) 3.5 - 5.1 mmol/L   Chloride 101 101 - 111 mmol/L   CO2 15 (L) 22 - 32 mmol/L   Glucose, Bld 334 (H) 65 - 99 mg/dL   BUN 7 6 - 20 mg/dL   Creatinine, Ser 0.88 0.61 - 1.24 mg/dL   Calcium 8.4 (L) 8.9 - 10.3 mg/dL   GFR calc non Af Amer >60 >60 mL/min   GFR calc Af Amer >60 >60 mL/min   Anion gap 12 5 - 15  I-Stat CG4 Lactic Acid, ED     Status: None   Collection Time: 09/06/15  6:36 PM  Result Value Ref Range   Lactic Acid, Venous 1.28 0.5 - 2.0 mmol/L    UA no evidence of UTI positive ketones  Lab Results  Component Value Date   HGBA1C 6.5* 01/24/2015    Estimated Creatinine Clearance: 174.3 mL/min (by C-G formula based on Cr of 0.88).  BNP (last 3 results) No results for input(s): PROBNP in the last 8760 hours.  Other results:  I have  pearsonaly reviewed this: ECG REPORT  Rate: 117  Rhythm: Sinus tachycardia ST&T Change: No ischemic changes QTC 445  There were no vitals filed for this visit.   Cultures:    Component Value Date/Time   SDES OTHER 08/05/2007 1055   SDES OTHER 08/05/2007 1055   SPECREQUEST SKIN SCRAPINGS 08/05/2007 1055   SPECREQUEST SKIN SCRAPINGS 08/05/2007 1055   CULT No Fungi Isolated in 4 Weeks 08/05/2007 1055   REPTSTATUS 08/06/2007 FINAL 08/05/2007 1055   REPTSTATUS 09/03/2007 FINAL 08/05/2007 1055     Radiological Exams on Admission: Ct Abdomen Pelvis W Contrast  09/06/2015  CLINICAL DATA:  Right lower quadrant pain since Monday, worsening  over the past 2 days. EXAM: CT ABDOMEN AND PELVIS WITH CONTRAST TECHNIQUE: Multidetector CT imaging of the abdomen and pelvis was performed using the standard protocol following bolus administration of intravenous contrast. CONTRAST:  151mL OMNIPAQUE IOHEXOL 300 MG/ML  SOLN COMPARISON:  11/17/2014 FINDINGS: The lung bases are clear.  Focal coronary artery calcifications. Prominent diffuse fatty infiltration of the liver with enlarged liver. The gallbladder, spleen, pancreas, adrenal glands, kidneys, abdominal aorta, inferior vena cava, and retroperitoneal lymph nodes are unremarkable. Stomach, small bowel, and colon are not abnormally distended. No free air or free fluid in the abdomen. Small periumbilical hernia containing fat. Pelvis: The sigmoid colon swings over to the right side of the pelvis. In the right sigmoid colon, there is focal wall thickening associated with multiple diverticula. Infiltration in the pelvic fat around the sigmoid colon and extending to the right lower quadrant. Changes are consistent with acute diverticulitis. Appearance is similar to previous study. No abscess is identified but extraluminal gas is present adjacent to the Foley. Colonic wall thickening may be due to inflammatory process although underlying neoplasm should be excluded and evaluation the colon after resolution of acute process is suggested. There is associated inflammatory wall thickening of the adjacent terminal ileum. Scattered mesenteric lymph nodes are present. These are likely reactive. The appendix is retrocecal and is thickened since previous study, with diameter now measuring 11 mm. Reactive or coexisting appendicitis may be present. Bladder wall is not thickened. Prostate gland is not enlarged. No free or loculated pelvic fluid collections. No destructive bone lesions. IMPRESSION: Persistent or recurrent diverticulitis involving the sigmoid colon. Colonic wall thickening may be due to inflammation although on  underlying colon lesion is not excluded. Edema and stranding around the sigmoid colon. No discrete abscess. There is reactive inflammatory wall thickening of the adjacent terminal ileum. The appendix is also dilated and reactive or coexisting appendicitis is not excluded. Diffuse fatty infiltration of the liver. Electronically Signed   By: Lucienne Capers M.D.   On: 09/06/2015 19:03    Chart has been reviewed  Family not at  Bedside    Assessment/Plan  40 year old gentleman for recurrent diverticulitis, presents with another episode  concomitant appendicitis could not be excluded. surgery is aware we'll see in consult continue with IV antibiotics. Was noted to have evidence of metabolic acidosis and hyperkalemia as well as bright red blood per rectum  Present on Admission:   . Hyperkalemia - has been treated in emergency department will repeat, received calcium and bicarb in ER  . Hyponatremia most likely secondary to dehydration will obtain urine electrolytes and repeat . Diverticulitis continue the IV antibiotics Flagyl and Cipro and keep NPO, surgical consult. If no imporvement will likely need laparoscopic investigation.  . Dehydration - administer IV fluids and follow creatinine  . Blood per rectum - likely  diverticular bleed, very mild,  CBC stable, continue to follow . OSA (obstructive sleep apnea) - not on CPAP Metabolic acidosis will repeat labs after was given bicarb and IV fluids Prophylaxis: SCD    CODE STATUS:  FULL CODE  as per patient    Disposition:  To home once workup is complete and patient is stable  Other plan as per orders.  I have spent a total of 65 min on this admission   Extra time was spent to discuss case with Dr. Erick Blinks 09/06/2015, 8:44 PM    [facility]  Pager [phone]   after 2 AM please page floor coverage PA If 7AM-7PM, please contact the day team taking care of the patient  [email or web address]  Password [email or web address]

## 2015-09-06 NOTE — ED Provider Notes (Signed)
CSN: [id number]     Arrival date & time 09/06/15  1538 History   First MD Initiated Contact with Patient 09/06/15 1623     Chief Complaint  Patient presents with  . Abdominal Pain     (Consider location/radiation/quality/duration/timing/severity/associated sxs/prior Treatment) HPI  39yM with abdominal pain. Gradual onset yesterday. It feels like "there is a midget in there running around with a Tiki torch." Constant and progressive since yesterday. Has a past history of diverticulitis and says it feels similar. Reports pain was also R sided with previous sigmoid diverticulitis. No appreciable exacerbating or relieving factors. No urinary complaints. Has felt hot/sweaty, but "I'm a fat guy and I always sweat a lot. Anorexia. No n/v. Abdominal surgical hx significant for umbilical hernia repair. Has noticed some BRB in toilet with BM recently. No rectal pain. Didn't notice blood when wiped. Seen by PCP prior to arrival and referred to ED because felt that he may need imaging.   Past Medical History  Diagnosis Date  . Gout     doesn't take any meds   . ADHD (attention deficit hyperactivity disorder)     takes Vyvanse daily  . OSA (obstructive sleep apnea) dx 05-2013    Rx a Cpap 3-15, can't use   . Diverticulitis     takes Electronics engineer daily  . Hernia, umbilical [id number]    Laparoscopic umbilical hernia repair with mesh  . Insomnia     takes Sonata nightly as needed  . History of colon polyps     precancerous  . Diabetes ([place])     but doesn't take any meds  . Obesity    Past Surgical History  Procedure Laterality Date  . Colonoscopy    . Lasik    . Umbilical hernia repair  02/01/2015    with mesh  . Umbilical hernia repair N/A 02/01/2015    Procedure: LAPAROSCOPIC UMBILICAL HERNIA REPAIR WITH MESH;  Surgeon: Rolm Bookbinder, MD;  Location: [facility];  Service: General;  Laterality: N/A;  . Insertion of mesh N/A 02/01/2015    Procedure: INSERTION OF MESH;  Surgeon: Rolm Bookbinder, MD;   Location: [facility] OR;  Service: General;  Laterality: N/A;   Family History  Problem Relation Age of Onset  . Colon cancer Neg Hx   . Prostate cancer Neg Hx   . Diabetes Neg Hx     distant fam members  . CAD Other     MGF  . Stroke Father     F, PGF  . Heart disease Maternal Grandfather   . Colon polyps Neg Hx   . Esophageal cancer Neg Hx   . Gallbladder disease Neg Hx   . Kidney disease Neg Hx    Social History  Substance Use Topics  . Smoking status: Current Some Day Smoker -- 1.00 packs/day for 20 years    Types: Cigarettes    Start date: 07/30/1992  . Smokeless tobacco: Never Used     Comment: quit smoiking July 1,2016  . Alcohol Use: No    Review of Systems All systems reviewed and negative, other than as noted in HPI.    Allergies  Review of patient's allergies indicates no known allergies.  Home Medications   Prior to Admission medications   Medication Sig Start Date End Date Taking? Authorizing Provider  glucose blood (ONE TOUCH ULTRA TEST) test strip Check blood sugars no more than twice daily. Patient not taking: Reported on 09/06/2015 11/15/14   Colon Branch, MD  Multiple Vitamin (MULTIVITAMIN)  tablet Take 1 tablet by mouth daily. [place]    Historical Provider, MD  oxyCODONE (OXY IR/ROXICODONE) 5 MG immediate release tablet Take 1-2 tablets (5-10 mg total) by mouth every 4 (four) hours as needed for moderate pain. Patient not taking: Reported on 09/06/2015 02/02/15   Rolm Bookbinder, MD  zaleplon (SONATA) 10 MG capsule Take 10 mg by mouth at bedtime as needed for sleep. Reported on 09/06/2015    Historical Provider, MD   BP 138/88 mmHg  Pulse 125  Temp(Src) 100.4 F (38 C) (Oral)  Resp 20  SpO2 95% Physical Exam  Constitutional: He appears well-developed and well-nourished. No distress.  sitting up in bed.  Pleasant. Doesn't seem distressed.  HENT:  Head: Normocephalic and atraumatic.  Eyes: Conjunctivae are normal. Right eye exhibits no discharge.  Left eye exhibits no discharge.  Neck: Neck supple.  Cardiovascular: Regular rhythm and normal heart sounds.  Exam reveals no gallop and no friction rub.   No murmur heard. tachycardia  Pulmonary/Chest: Effort normal and breath sounds normal. No respiratory distress.  Abdominal: Soft. He exhibits no distension. There is tenderness.  Suprapubic and right lower quadrant tenderness without rebound or guarding. No distention.  Musculoskeletal: He exhibits no edema or tenderness.  Neurological: He is alert.  Skin: Skin is warm and dry.  Psychiatric: He has a normal mood and affect. His behavior is normal. Thought content normal.  Nursing note and vitals reviewed.   ED Course  Procedures (including critical care time) Labs Review Labs Reviewed  COMPREHENSIVE METABOLIC PANEL - Abnormal; Notable for the following:    Sodium 129 (*)    Potassium 6.9 (*)    Chloride 100 (*)    CO2 15 (*)    Glucose, Bld 441 (*)    Calcium 8.8 (*)    AST 98 (*)    Total Bilirubin 4.2 (*)    All other components within normal limits  CBC - Abnormal; Notable for the following:    WBC 14.6 (*)    MCHC 36.2 (*)    All other components within normal limits  URINALYSIS, ROUTINE W REFLEX MICROSCOPIC (NOT AT [facility]) - Abnormal; Notable for the following:    Specific Gravity, Urine >1.046 (*)    Glucose, UA >1000 (*)    Ketones, ur >80 (*)    All other components within normal limits  BASIC METABOLIC PANEL - Abnormal; Notable for the following:    Sodium 128 (*)    Potassium >7.5 (*)    CO2 15 (*)    Glucose, Bld 334 (*)    Calcium 8.4 (*)    All other components within normal limits  HEMOGLOBIN A1C - Abnormal; Notable for the following:    Hgb A1c MFr Bld 13.1 (*)    All other components within normal limits  URINE MICROSCOPIC-ADD ON - Abnormal; Notable for the following:    Squamous Epithelial / LPF 0-5 (*)    Bacteria, UA RARE (*)    All other components within normal limits  OSMOLALITY, URINE -  Abnormal; Notable for the following:    Osmolality, Ur 933 (*)    All other components within normal limits  COMPREHENSIVE METABOLIC PANEL - Abnormal; Notable for the following:    Sodium 128 (*)    Potassium 5.9 (*)    Chloride 98 (*)    CO2 19 (*)    Glucose, Bld 285 (*)    BUN 5 (*)    Calcium 8.1 (*)    Total  Protein >12.0 (*)    Albumin 3.1 (*)    AST 75 (*)    Total Bilirubin 3.2 (*)    All other components within normal limits  HEMOGLOBIN A1C - Abnormal; Notable for the following:    Hgb A1c MFr Bld 13.9 (*)    All other components within normal limits  PHOSPHORUS - Abnormal; Notable for the following:    Phosphorus 2.4 (*)    All other components within normal limits  COMPREHENSIVE METABOLIC PANEL - Abnormal; Notable for the following:    Sodium 134 (*)    Glucose, Bld 249 (*)    BUN <5 (*)    Calcium 8.4 (*)    Total Protein 6.1 (*)    Albumin 3.2 (*)    AST 59 (*)    Total Bilirubin 2.1 (*)    All other components within normal limits  CBC - Abnormal; Notable for the following:    WBC 10.9 (*)    HCT 38.6 (*)    Platelets 141 (*)    All other components within normal limits  PROTIME-INR - Abnormal; Notable for the following:    Prothrombin Time 15.3 (*)    All other components within normal limits  GLUCOSE, CAPILLARY - Abnormal; Notable for the following:    Glucose-Capillary 267 (*)    All other components within normal limits  GLUCOSE, CAPILLARY - Abnormal; Notable for the following:    Glucose-Capillary 189 (*)    All other components within normal limits  GLUCOSE, CAPILLARY - Abnormal; Notable for the following:    Glucose-Capillary 219 (*)    All other components within normal limits  GLUCOSE, CAPILLARY - Abnormal; Notable for the following:    Glucose-Capillary 241 (*)    All other components within normal limits  GLUCOSE, CAPILLARY - Abnormal; Notable for the following:    Glucose-Capillary 271 (*)    All other components within normal limits   CBC - Abnormal; Notable for the following:    WBC 11.2 (*)    Hemoglobin 12.8 (*)    HCT 38.2 (*)    Platelets 125 (*)    All other components within normal limits  GLUCOSE, CAPILLARY - Abnormal; Notable for the following:    Glucose-Capillary 246 (*)    All other components within normal limits  GLUCOSE, CAPILLARY - Abnormal; Notable for the following:    Glucose-Capillary 179 (*)    All other components within normal limits  GLUCOSE, CAPILLARY - Abnormal; Notable for the following:    Glucose-Capillary 200 (*)    All other components within normal limits  GLUCOSE, CAPILLARY - Abnormal; Notable for the following:    Glucose-Capillary 205 (*)    All other components within normal limits  GLUCOSE, CAPILLARY - Abnormal; Notable for the following:    Glucose-Capillary 181 (*)    All other components within normal limits  GLUCOSE, CAPILLARY - Abnormal; Notable for the following:    Glucose-Capillary 172 (*)    All other components within normal limits  GLUCOSE, CAPILLARY - Abnormal; Notable for the following:    Glucose-Capillary 200 (*)    All other components within normal limits  GLUCOSE, CAPILLARY - Abnormal; Notable for the following:    Glucose-Capillary 161 (*)    All other components within normal limits  GLUCOSE, CAPILLARY - Abnormal; Notable for the following:    Glucose-Capillary 185 (*)    All other components within normal limits  GLUCOSE, CAPILLARY - Abnormal; Notable for the following:  Glucose-Capillary 179 (*)    All other components within normal limits  GLUCOSE, CAPILLARY - Abnormal; Notable for the following:    Glucose-Capillary 194 (*)    All other components within normal limits  CBC - Abnormal; Notable for the following:    Hemoglobin 11.5 (*)    HCT 35.5 (*)    RDW 15.8 (*)    All other components within normal limits  BASIC METABOLIC PANEL - Abnormal; Notable for the following:    Potassium 3.0 (*)    Glucose, Bld 193 (*)    BUN <5 (*)     Calcium 8.2 (*)    All other components within normal limits  GLUCOSE, CAPILLARY - Abnormal; Notable for the following:    Glucose-Capillary 150 (*)    All other components within normal limits  GLUCOSE, CAPILLARY - Abnormal; Notable for the following:    Glucose-Capillary 175 (*)    All other components within normal limits  GLUCOSE, CAPILLARY - Abnormal; Notable for the following:    Glucose-Capillary 173 (*)    All other components within normal limits  GLUCOSE, CAPILLARY - Abnormal; Notable for the following:    Glucose-Capillary 168 (*)    All other components within normal limits  GLUCOSE, CAPILLARY - Abnormal; Notable for the following:    Glucose-Capillary 166 (*)    All other components within normal limits  BASIC METABOLIC PANEL - Abnormal; Notable for the following:    Potassium 3.4 (*)    Glucose, Bld 202 (*)    BUN <5 (*)    Calcium 8.3 (*)    All other components within normal limits  GLUCOSE, CAPILLARY - Abnormal; Notable for the following:    Glucose-Capillary 162 (*)    All other components within normal limits  GLUCOSE, CAPILLARY - Abnormal; Notable for the following:    Glucose-Capillary 168 (*)    All other components within normal limits  GLUCOSE, CAPILLARY - Abnormal; Notable for the following:    Glucose-Capillary 199 (*)    All other components within normal limits  GLUCOSE, CAPILLARY - Abnormal; Notable for the following:    Glucose-Capillary 172 (*)    All other components within normal limits  GLUCOSE, CAPILLARY - Abnormal; Notable for the following:    Glucose-Capillary 170 (*)    All other components within normal limits  CBC - Abnormal; Notable for the following:    Hemoglobin 11.6 (*)    HCT 36.5 (*)    RDW 15.8 (*)    All other components within normal limits  BASIC METABOLIC PANEL - Abnormal; Notable for the following:    Glucose, Bld 179 (*)    BUN <5 (*)    Calcium 8.4 (*)    All other components within normal limits  GLUCOSE,  CAPILLARY - Abnormal; Notable for the following:    Glucose-Capillary 185 (*)    All other components within normal limits  GLUCOSE, CAPILLARY - Abnormal; Notable for the following:    Glucose-Capillary 217 (*)    All other components within normal limits  GLUCOSE, CAPILLARY - Abnormal; Notable for the following:    Glucose-Capillary 184 (*)    All other components within normal limits  CBG MONITORING, ED - Abnormal; Notable for the following:    Glucose-Capillary 268 (*)    All other components within normal limits  LIPASE, BLOOD  CREATININE, URINE, RANDOM  SODIUM, URINE, RANDOM  MAGNESIUM  TSH  I-STAT CG4 LACTIC ACID, ED  I-STAT CG4 LACTIC ACID, ED  Imaging Review No results found. I have personally reviewed and evaluated these images and lab results as part of my medical decision-making.   EKG Interpretation None      MDM   Final diagnoses:  None    Discussed with Dr Georgette Dover. No good way to discriminate reactive inflammatory changes from adjacent diverticulitis versus acute appendicitis. With active diverticulitis, would not be in any hurry to take to OR at this point. He is about to start case. Will evaluate patient once available. With additional medical problems, will admit to hospitalist service.   Repeat potassium elevated. Blood is lipemic. Sample has to be spun which results in hemolysis. Not sure if this is "real." Renal function is ok. Will treat as such though. Insulin. He is hyperglycemic anyways. Loel Dubonnet, MD 09/12/15 1044

## 2015-09-06 NOTE — Progress Notes (Signed)
Subjective:    Patient ID: Spencer Good, male    DOB: December 02, 1975, 40 y.o.   MRN: [id number]  DOS:  09/06/2015 Type of visit - description : Acute visit Interval history: Sx started 2 days ago, complaining of RLQ and R flank, pain, pain is  steady  with on and off exacerbation. Symptoms were right-sided from the onset. Also has seen some red blood per rectum, not when he wipes but at the commode.  He thinks he has another episode of diverticulitis. Last bowel movement: This morning, a very small amount. Has been taking some oxycodone left over from previous surgery.  Review of Systems  Admits to fevers, appetite is gone. Has some nausea but no vomiting today. No dysuria, gross hematuria or difficulty urinating Past Medical History  Diagnosis Date  . Gout     doesn't take any meds   . ADHD (attention deficit hyperactivity disorder)     takes Vyvanse daily  . OSA (obstructive sleep apnea) dx 05-2013    Rx a Cpap 3-15, can't use   . Diverticulitis     takes Electronics engineer daily  . Hernia, umbilical [id number]    Laparoscopic umbilical hernia repair with mesh  . Insomnia     takes Sonata nightly as needed  . History of colon polyps     precancerous  . Diabetes ([place])     but doesn't take any meds  . Obesity     Past Surgical History  Procedure Laterality Date  . Colonoscopy    . Lasik    . Umbilical hernia repair  02/01/2015    with mesh  . Umbilical hernia repair N/A 02/01/2015    Procedure: LAPAROSCOPIC UMBILICAL HERNIA REPAIR WITH MESH;  Surgeon: Rolm Bookbinder, MD;  Location: [facility];  Service: General;  Laterality: N/A;  . Insertion of mesh N/A 02/01/2015    Procedure: INSERTION OF MESH;  Surgeon: Rolm Bookbinder, MD;  Location: [facility];  Service: General;  Laterality: N/A;    Social History   Social History  . Marital Status: Single    Spouse Name: N/A  . Number of Children: 0  . Years of Education: N/A   Occupational History  . 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 Smoking status: Former Smoker -- 1.00 packs/day for 20 years    Types: Cigarettes    Start date: 07/30/1992  . Smokeless tobacco: Never Used     Comment: quit smoiking July 1,2016  . Alcohol Use: No  . Drug Use: No  . Sexual Activity: Not Currently   Other Topics Concern  . Not on file   Social History Narrative        Medication List       This list is accurate as of: 09/06/15  4:11 PM.  Always use your most recent med list.               glucose blood test strip  Commonly known as:  ONE TOUCH ULTRA TEST  Check blood sugars no more than twice daily.     multivitamin tablet  Take 1 tablet by mouth daily. Vegan Multi-Pak     oxyCODONE 5 MG immediate release tablet  Commonly known as:  Oxy IR/ROXICODONE  Take 1-2 tablets (5-10 mg total) by mouth every 4 (four) hours as needed for moderate pain.     zaleplon 10 MG capsule  Commonly known as:  SONATA  Take 10 mg by mouth at  bedtime as needed for sleep. Reported on 09/06/2015           Objective:   Physical Exam  Abdominal:     BP 144/84 mmHg  Pulse 133  Temp(Src) 100.4 F (38 C) (Axillary)  Ht 5' 11.25" (1.81 m)  Wt 340 lb (154.223 kg)  BMI 47.08 kg/m2  SpO2 93%  General:   Well developed, well nourished, febrile, tachycardic but not toxic appearing.  HEENT:  Normocephalic . Face symmetric, atraumatic. Not pale or jaundice. Lungs:  CTA B Normal respiratory effort, no intercostal retractions, no accessory muscle use. Heart: RRR,  no murmur.  No pretibial edema bilaterally  Abdomen:  Not distended, soft, quite tender at the right lower quadrant with ? Mild  rebound. Some tenderness at the mid right abdomen as well. No mass. Bowel sounds present. Skin: Exposed areas without rash. Not pale. Not jaundice Neurologic:  alert & oriented X3.  Speech normal, gait appropriate for age and unassisted Strength symmetric and appropriate for age.  Psych: Cognition and judgment  appear intact.  Cooperative with normal attention span and concentration.  Behavior appropriate. No anxious or depressed appearing.    Assessment & Plan:   Assessment DM Gout OSA ADHD Insomnia H/o diverticulitis, sigmoid,w/ perf, conservative Rx 10-2014   Plan: Right lower quadrant abdominal pain: Could be related to diverticulitis (last year, he had a sigmoid diverticulitis but sx were more on the right side) versus an acute appendicitis. Needs to be seen immediately at the ER. I contacted Dr. Alvino Chapel who agreed to see the patient. We discussed transfer by ambulance, he states that he is fine to drive, he does not look toxic. We agreed he will go to the ER now. Encourage to come back for routine checkups,  has diabetes etc.

## 2015-09-06 NOTE — Progress Notes (Signed)
Pre visit review using our clinic review tool, if applicable. No additional management support is needed unless otherwise documented below in the visit note. 

## 2015-09-06 NOTE — ED Notes (Signed)
Pt sent here from pcp office due to RLQ pain since Monday, radiates into lower abd. Also reports nausea and blood in stools recently.

## 2015-09-06 NOTE — Consult Note (Signed)
Reason for Consult: Recurrent diverticulitis versus possible appendicitis Referring Physician: [name] is an 40 y.o. male.  HPI: This is a 40 year old male who was admitted in May 2016 for sigmoid diverticulitis with contained perforation. The patient has a fairly redundant sigmoid colon that tracks over to the right side of the abdomen. His diverticulitis was treated with antibiotics. He had a fairly symptomatic umbilical hernia and this was fixed laparoscopically by Dr. Rolm Bookbinder on 02/01/15.  He presents now with a 2 day history of right lower quadrant and right flank pain. This pain has not been migrating. He has seen some bright red blood per rectum. He had a bowel movement this morning. He was referred to the emergency department for evaluation. He states that this pain hurts more than previous episodes.  Past Medical History  Diagnosis Date  . Gout     doesn't take any meds   . ADHD (attention deficit hyperactivity disorder)     takes Vyvanse daily  . OSA (obstructive sleep apnea) dx 05-2013    Rx a Cpap 3-15, can't use   . Diverticulitis     takes Electronics engineer daily  . Hernia, umbilical 8938    Laparoscopic umbilical hernia repair with mesh  . Insomnia     takes Sonata nightly as needed  . History of colon polyps     precancerous  . Diabetes ([place])     but doesn't take any meds  . Obesity     Past Surgical History  Procedure Laterality Date  . Colonoscopy    . Lasik    . Umbilical hernia repair  02/01/2015    with mesh  . Umbilical hernia repair N/A 02/01/2015    Procedure: LAPAROSCOPIC UMBILICAL HERNIA REPAIR WITH MESH;  Surgeon: Rolm Bookbinder, MD;  Location: [place];  Service: General;  Laterality: N/A;  . Insertion of mesh N/A 02/01/2015    Procedure: INSERTION OF MESH;  Surgeon: Rolm Bookbinder, MD;  [facility] OR;  Service: General;  Laterality: N/A;    Family History  Problem Relation Age of Onset  . Colon cancer Neg Hx   . Prostate cancer  Neg Hx   . Diabetes Neg Hx     distant fam members  . CAD Other     MGF  . Stroke Father     F, PGF  . Heart disease Maternal Grandfather   . Colon polyps Neg Hx   . Esophageal cancer Neg Hx   . Gallbladder disease Neg Hx   . Kidney disease Neg Hx     Social History:  reports that he has quit smoking. His smoking use included Cigarettes. He started smoking about 23 years ago. He has a 20 pack-year smoking history. He has never used smokeless tobacco. He reports that he does not drink alcohol or use illicit drugs.  Allergies: No Known Allergies  Medications:  Prior to Admission medications   Medication Sig Start Date End Date Taking? Authorizing Provider  Multiple Vitamin (MULTIVITAMIN) tablet Take 1 tablet by mouth daily. Vegan Multi-Pak   Yes Historical Provider, MD  oxyCODONE (OXY IR/ROXICODONE) 5 MG immediate release tablet Take 1-2 tablets (5-10 mg total) by mouth every 4 (four) hours as needed for moderate pain. 02/02/15  Yes Rolm Bookbinder, MD  glucose blood (ONE TOUCH ULTRA TEST) test strip Check blood sugars no more than twice daily. Patient not taking: Reported on 09/06/2015 11/15/14   Colon Branch, MD     Results for orders placed  or performed during the hospital encounter of 09/06/15 (from the past 48 hour(s))  Lipase, blood     Status: None   Collection Time: 09/06/15  3:55 PM  Result Value Ref Range   Lipase 18 11 - 51 U/L  Comprehensive metabolic panel     Status: Abnormal   Collection Time: 09/06/15  3:55 PM  Result Value Ref Range   Sodium 129 (L) 135 - 145 mmol/L   Potassium 6.9 (HH) 3.5 - 5.1 mmol/L    Comment: HEMOLYSIS AT THIS LEVEL MAY AFFECT RESULT POST-ULTRACENTRIFUGATION CRITICAL RESULT CALLED TO, READ BACK BY AND VERIFIED WITH: N STEPHENSON,RN [id number] 09/05/05 WBOND    Chloride 100 (L) 101 - 111 mmol/L   CO2 15 (L) 22 - 32 mmol/L   Glucose, Bld 441 (H) 65 - 99 mg/dL   BUN 7 6 - 20 mg/dL   Creatinine, Ser 0.92 0.61 - 1.24 mg/dL   Calcium 8.8 (L) 8.9  - 10.3 mg/dL   Total Protein 7.2 6.5 - 8.1 g/dL   Albumin 3.8 3.5 - 5.0 g/dL   AST 98 (H) 15 - 41 U/L   ALT 52 17 - 63 U/L   Alkaline Phosphatase 95 38 - 126 U/L   Total Bilirubin 4.2 (H) 0.3 - 1.2 mg/dL   GFR calc non Af Amer >60 >60 mL/min   GFR calc Af Amer >60 >60 mL/min    Comment: (NOTE) The eGFR has been calculated using the CKD EPI equation. This calculation has not been validated in all clinical situations. eGFR's persistently <60 mL/min signify possible Chronic Kidney Disease.    Anion gap 14 5 - 15  CBC     Status: Abnormal   Collection Time: 09/06/15  3:55 PM  Result Value Ref Range   WBC 14.6 (H) 4.0 - 10.5 K/uL   RBC 5.16 4.22 - 5.81 MIL/uL   Hemoglobin 15.4 13.0 - 17.0 g/dL   HCT 42.5 39.0 - 52.0 %   MCV 82.4 78.0 - 100.0 fL   MCH 29.8 26.0 - 34.0 pg   MCHC 36.2 (H) 30.0 - 36.0 g/dL   RDW 15.4 11.5 - 15.5 %   Platelets 179 150 - 400 K/uL    Comment: PLATELET COUNT CONFIRMED BY SMEAR  I-Stat CG4 Lactic Acid, ED     Status: None   Collection Time: 09/06/15  4:05 PM  Result Value Ref Range   Lactic Acid, Venous 1.99 0.5 - 2.0 mmol/L  Basic metabolic panel     Status: Abnormal   Collection Time: 09/06/15  6:16 PM  Result Value Ref Range   Sodium 128 (L) 135 - 145 mmol/L   Potassium >7.5 (HH) 3.5 - 5.1 mmol/L    Comment: POST-ULTRACENTRIFUGATION HEMOLYSIS AT THIS LEVEL MAY AFFECT RESULT CRITICAL RESULT CALLED TO, READ BACK BY AND VERIFIED WITH: Melina Modena [id number] 09/06/2015 WBOND    Chloride 101 101 - 111 mmol/L   CO2 15 (L) 22 - 32 mmol/L   Glucose, Bld 334 (H) 65 - 99 mg/dL   BUN 7 6 - 20 mg/dL   Creatinine, Ser 0.88 0.61 - 1.24 mg/dL   Calcium 8.4 (L) 8.9 - 10.3 mg/dL   GFR calc non Af Amer >60 >60 mL/min   GFR calc Af Amer >60 >60 mL/min    Comment: (NOTE) The eGFR has been calculated using the CKD EPI equation. This calculation has not been validated in all clinical situations. eGFR's persistently <60 mL/min signify possible Chronic  Kidney Disease.  Anion gap 12 5 - 15  I-Stat CG4 Lactic Acid, ED     Status: None   Collection Time: 09/06/15  6:36 PM  Result Value Ref Range   Lactic Acid, Venous 1.28 0.5 - 2.0 mmol/L  Urinalysis, Routine w reflex microscopic (not at [facility])     Status: Abnormal   Collection Time: 09/06/15  7:24 PM  Result Value Ref Range   Color, Urine YELLOW YELLOW   APPearance CLEAR CLEAR   Specific Gravity, Urine >1.046 (H) 1.005 - 1.030   pH 5.5 5.0 - 8.0   Glucose, UA >1000 (A) NEGATIVE mg/dL   Hgb urine dipstick NEGATIVE NEGATIVE   Bilirubin Urine NEGATIVE NEGATIVE   Ketones, ur >80 (A) NEGATIVE mg/dL   Protein, ur NEGATIVE NEGATIVE mg/dL   Nitrite NEGATIVE NEGATIVE   Leukocytes, UA NEGATIVE NEGATIVE  Urine microscopic-add on     Status: Abnormal   Collection Time: 09/06/15  7:24 PM  Result Value Ref Range   Squamous Epithelial / LPF 0-5 (A) NONE SEEN   WBC, UA NONE SEEN 0 - 5 WBC/hpf   RBC / HPF 0-5 0 - 5 RBC/hpf   Bacteria, UA RARE (A) NONE SEEN    Ct Abdomen Pelvis W Contrast  09/06/2015  CLINICAL DATA:  Right lower quadrant pain since Monday, worsening over the past 2 days. EXAM: CT ABDOMEN AND PELVIS WITH CONTRAST TECHNIQUE: Multidetector CT imaging of the abdomen and pelvis was performed using the standard protocol following bolus administration of intravenous contrast. CONTRAST:  149m OMNIPAQUE IOHEXOL 300 MG/ML  SOLN COMPARISON:  11/17/2014 FINDINGS: The lung bases are clear.  Focal coronary artery calcifications. Prominent diffuse fatty infiltration of the liver with enlarged liver. The gallbladder, spleen, pancreas, adrenal glands, kidneys, abdominal aorta, inferior vena cava, and retroperitoneal lymph nodes are unremarkable. Stomach, small bowel, and colon are not abnormally distended. No free air or free fluid in the abdomen. Small periumbilical hernia containing fat. Pelvis: The sigmoid colon swings over to the right side of the pelvis. In the right sigmoid colon, there is  focal wall thickening associated with multiple diverticula. Infiltration in the pelvic fat around the sigmoid colon and extending to the right lower quadrant. Changes are consistent with acute diverticulitis. Appearance is similar to previous study. No abscess is identified but extraluminal gas is present adjacent to the Foley. Colonic wall thickening may be due to inflammatory process although underlying neoplasm should be excluded and evaluation the colon after resolution of acute process is suggested. There is associated inflammatory wall thickening of the adjacent terminal ileum. Scattered mesenteric lymph nodes are present. These are likely reactive. The appendix is retrocecal and is thickened since previous study, with diameter now measuring 11 mm. Reactive or coexisting appendicitis may be present. Bladder wall is not thickened. Prostate gland is not enlarged. No free or loculated pelvic fluid collections. No destructive bone lesions. IMPRESSION: Persistent or recurrent diverticulitis involving the sigmoid colon. Colonic wall thickening may be due to inflammation although on underlying colon lesion is not excluded. Edema and stranding around the sigmoid colon. No discrete abscess. There is reactive inflammatory wall thickening of the adjacent terminal ileum. The appendix is also dilated and reactive or coexisting appendicitis is not excluded. Diffuse fatty infiltration of the liver. Electronically Signed   By: WLucienne CapersM.D.   On: 09/06/2015 19:03    Review of Systems  Constitutional: Negative for weight loss.  HENT: Negative for ear discharge, ear pain, hearing loss and tinnitus.   Eyes: Negative  for blurred vision, double vision, photophobia and pain.  Respiratory: Negative for cough, sputum production and shortness of breath.   Cardiovascular: Negative for chest pain.  Gastrointestinal: Positive for abdominal pain and blood in stool. Negative for nausea and vomiting.  Genitourinary:  Positive for flank pain. Negative for dysuria, urgency and frequency.  Musculoskeletal: Negative for myalgias, back pain, joint pain, falls and neck pain.  Neurological: Negative for dizziness, tingling, sensory change, focal weakness, loss of consciousness and headaches.  Endo/Heme/Allergies: Does not bruise/bleed easily.  Psychiatric/Behavioral: Negative for depression, memory loss and substance abuse. The patient is not nervous/anxious.    Blood pressure 133/88, pulse 124, temperature 98.1 F (36.7 C), temperature source Oral, resp. rate 21, SpO2 95 %. Physical Exam WDWN in NAD HEENT:  EOMI, sclera anicteric Neck:  No masses, no thyromegaly Lungs:  CTA bilaterally; normal respiratory effort CV:  Regular rate and rhythm; no murmurs Abd:  Obese; tender in suprapubic region extending towards the right lower quadrant and around the right flank Ext:  Well-perfused; no edema Skin:  Warm, dry; no sign of jaundice  Assessment/Plan: 1.  Recurrent sigmoid diverticulitis (with extension of the sigmoid colon into the right side of the pelvis) with reactive inflammation of the terminal ileum 2.  Possible concurrent appendicitis versus reactive appendicitis  Recommendations: Would treat the patient for diverticulitis with intravenous antibiotics Would keep the patient nothing by mouth except for ice chips Repeat labs in a.m. Will reevaluate. If his abdominal exam worsens, will consider diagnostic laparoscopy with possible appendectomy. This may be complicated by the fact that he has ventral hernia mesh in place.  TSUEI,MATTHEW K. 09/06/2015, 9:07 PM

## 2015-09-07 DIAGNOSIS — Z09 Encounter for follow-up examination after completed treatment for conditions other than malignant neoplasm: Secondary | ICD-10-CM | POA: Insufficient documentation

## 2015-09-07 LAB — COMPREHENSIVE METABOLIC PANEL
ALT: 18 U/L (ref 17–63)
ALT: 36 U/L (ref 17–63)
AST: 75 U/L — ABNORMAL HIGH (ref 15–41)
AST: 59 U/L — ABNORMAL HIGH (ref 15–41)
Albumin: 3.1 g/dL — ABNORMAL LOW (ref 3.5–5.0)
Albumin: 3.2 g/dL — ABNORMAL LOW (ref 3.5–5.0)
Alkaline Phosphatase: 76 U/L (ref 38–126)
Alkaline Phosphatase: 78 U/L (ref 38–126)
Anion gap: 11 (ref 5–15)
Anion gap: 9 (ref 5–15)
BUN: 5 mg/dL — ABNORMAL LOW (ref 6–20)
BUN: <5 mg/dL — ABNORMAL LOW (ref 6–20)
CO2: 19 mmol/L — ABNORMAL LOW (ref 22–32)
CO2: 23 mmol/L (ref 22–32)
Calcium: 8.1 mg/dL — ABNORMAL LOW (ref 8.9–10.3)
Calcium: 8.4 mg/dL — ABNORMAL LOW (ref 8.9–10.3)
Chloride: 98 mmol/L — ABNORMAL LOW (ref 101–111)
Chloride: 102 mmol/L (ref 101–111)
Creatinine, Ser: 0.78 mg/dL (ref 0.61–1.24)
Creatinine, Ser: 0.84 mg/dL (ref 0.61–1.24)
GFR calc Af Amer: >60 mL/min (ref >60)
GFR calc Af Amer: >60 mL/min (ref >60)
GFR calc non Af Amer: >60 mL/min (ref >60)
GFR calc non Af Amer: >60 mL/min (ref >60)
Glucose, Bld: 285 mg/dL — ABNORMAL HIGH (ref 65–99)
Glucose, Bld: 249 mg/dL — ABNORMAL HIGH (ref 65–99)
Potassium: 5.9 mmol/L — ABNORMAL HIGH (ref 3.5–5.1)
Potassium: 4.9 mmol/L (ref 3.5–5.1)
Sodium: 128 mmol/L — ABNORMAL LOW (ref 135–145)
Sodium: 134 mmol/L — ABNORMAL LOW (ref 135–145)
Total Bilirubin: 3.2 mg/dL — ABNORMAL HIGH (ref 0.3–1.2)
Total Bilirubin: 2.1 mg/dL — ABNORMAL HIGH (ref 0.3–1.2)
Total Protein: >12 g/dL — ABNORMAL HIGH (ref 6.5–8.1)
Total Protein: 6.1 g/dL — ABNORMAL LOW (ref 6.5–8.1)

## 2015-09-07 LAB — CBC
HCT: 38.6 % — ABNORMAL LOW (ref 39.0–52.0)
Hemoglobin: 13.6 g/dL (ref 13.0–17.0)
MCH: 29.2 pg (ref 26.0–34.0)
MCHC: 35.2 g/dL (ref 30.0–36.0)
MCV: 82.8 fL (ref 78.0–100.0)
Platelets: 141 10*3/uL — ABNORMAL LOW (ref 150–400)
RBC: 4.66 MIL/uL (ref 4.22–5.81)
RDW: 15.1 % (ref 11.5–15.5)
WBC: 10.9 10*3/uL — ABNORMAL HIGH (ref 4.0–10.5)

## 2015-09-07 LAB — PROTIME-INR
INR: 1.19 (ref 0.00–1.49)
Prothrombin Time: 15.3 seconds — ABNORMAL HIGH (ref 11.6–15.2)

## 2015-09-07 LAB — GLUCOSE, CAPILLARY
Glucose-Capillary: 189 mg/dL — ABNORMAL HIGH (ref 65–99)
Glucose-Capillary: 219 mg/dL — ABNORMAL HIGH (ref 65–99)
Glucose-Capillary: 241 mg/dL — ABNORMAL HIGH (ref 65–99)
Glucose-Capillary: 246 mg/dL — ABNORMAL HIGH (ref 65–99)
Glucose-Capillary: 267 mg/dL — ABNORMAL HIGH (ref 65–99)
Glucose-Capillary: 271 mg/dL — ABNORMAL HIGH (ref 65–99)

## 2015-09-07 LAB — MAGNESIUM: Magnesium: 1.7 mg/dL (ref 1.7–2.4)

## 2015-09-07 LAB — TSH: TSH: 3.244 u[IU]/mL (ref 0.350–4.500)

## 2015-09-07 LAB — PHOSPHORUS: Phosphorus: 2.4 mg/dL — ABNORMAL LOW (ref 2.5–4.6)

## 2015-09-07 LAB — HEMOGLOBIN A1C
Hgb A1c MFr Bld: 13.1 % — ABNORMAL HIGH (ref 4.8–5.6)
Mean Plasma Glucose: 329 mg/dL

## 2015-09-07 MED ORDER — ENOXAPARIN SODIUM 40 MG/0.4ML ~~LOC~~ SOLN
40.0000 mg | SUBCUTANEOUS | Status: DC
  Administered 2015-09-07: 40 mg via SUBCUTANEOUS
  Filled 2015-09-07: qty 0.4

## 2015-09-07 MED ORDER — CEFTRIAXONE SODIUM 2 G IJ SOLR
2.0000 g | Freq: Every day | INTRAMUSCULAR | Status: DC
  Administered 2015-09-07 – 2015-09-08 (×2): 2 g via INTRAVENOUS
  Filled 2015-09-07 (×4): qty 2

## 2015-09-07 MED ORDER — ZOLPIDEM TARTRATE 5 MG PO TABS: 5.0000 mg | ORAL_TABLET | Freq: Once | ORAL | Status: DC

## 2015-09-07 NOTE — Progress Notes (Signed)
Inpatient Diabetes Program Recommendations  AACE/ADA: New Consensus Statement on Inpatient Glycemic Control (2015)  Target Ranges:  Prepandial:   less than 140 mg/dL      Peak postprandial:   less than 180 mg/dL (1-2 hours)      Critically ill patients:  140 - 180 mg/dL   Review of Glycemic Control Results for Spencer Good, Spencer Good (MRN [id number]) as of 09/07/2015 14:04  Ref. Range 09/06/2015 20:57 09/07/2015 00:13 09/07/2015 04:21 09/07/2015 08:16 09/07/2015 12:08  Glucose-Capillary Latest Ref Range: 65-99 mg/dL 268 (H) 267 (H) 189 (H) 219 (H) 241 (H)  Results for Spencer Good, Spencer Good (MRN [id number]) as of 09/07/2015 14:04  Ref. Range 09/14/2013 09:30 12/23/2013 08:45 04/25/2014 09:29 09/29/2014 09:26 10/24/2014 05:35 01/24/2015 09:41 09/06/2015 18:16  Hemoglobin A1C Latest Ref Range: 4.8-5.6 % 7.3 (H) 6.6 (H) 6.4 6.4 6.2 (H) 6.5 (H) 13.1 (H)   Diabetes history: DM Type 2 Outpatient Diabetes medications: Pt. Was prescribed Invokamet but had stopped taking several months ago. Current orders for Inpatient glycemic control: Novolog sensitive correction 0-9.  Inpatient Diabetes Program Recommendations:  Spoke with pt. Pt. States he is under enormous stress with his job and cannot add any classes or anything to his day. Discussed changes since his last A1c 01/24/15 was 6.5. Pt. Shared that he has stopped going to the gym, changed nutrition (including low fiber due to diverticulitis), stopped Invokamet. Reviewed with patient need for diabetes management and risk factors of elevated blood sugars. Pt. Acknowledges understanding and states he know what he needs to do. Pt's weight has increased by 20 lbs since Oct 31, 2014. Please consider adding lantus insulin 30 units daily (0.2 units/kg) to start in hospital and on discharge, and labs to include cholesterol level.  Thank you, Spencer Good. Spencer Thier, RN, MSN, CDE Inpatient Glycemic Control Team Team Pager [phone] (8am-5pm) 09/07/2015 2:14 PM

## 2015-09-07 NOTE — Assessment & Plan Note (Signed)
Right lower quadrant abdominal pain: Could be related to diverticulitis (last year, he had a sigmoid diverticulitis but sx were more on the right side) versus an acute appendicitis. Needs to be seen immediately at the ER. I contacted Dr. Alvino Chapel who agreed to see the patient. We discussed transfer by ambulance, he states that he is fine to drive, he does not look toxic. We agreed he will go to the ER now.

## 2015-09-07 NOTE — Care Management Note (Signed)
Case Management Note  Patient Details  Name: Spencer Good MRN: [id number] Date of Birth: Nov 23, 1975  Subjective/Objective:                    Action/Plan:  Initial UR completed  Expected Discharge Date:                  Expected Discharge Plan:  Home/Self Care  In-House Referral:     Discharge planning Services     Post Acute Care Choice:    Choice offered to:     DME Arranged:    DME Agency:     HH Arranged:    [place] Agency:     Status of Service:  In process, will continue to follow  Medicare Important Message Given:    Date Medicare IM Given:    Medicare IM give by:    Date Additional Medicare IM Given:    Additional Medicare Important Message give by:     If discussed at [place] of Stay Meetings, dates discussed:    Additional Comments:  Marilu Favre, RN 09/07/2015, 1:52 PM

## 2015-09-07 NOTE — Progress Notes (Signed)
Patient ID: Spencer Good, male   DOB: 13-Aug-1975, 40 y.o.   MRN: [id number]     [facility]      [address] [place], [place] [address]    Phone: [phone] FAX: [phone]     Subjective: Pain is down from 5 to 4.  Waxes and wanes in severity up to a 7.  No flatus. No n/v.  Afebrile.  WBC down to 10.9k from 14k.   Objective:  Vital signs:  Filed Vitals:   09/06/15 2130 09/06/15 2230 09/06/15 2326 09/07/15 0659  BP: 141/89 148/88 170/86 119/79  Pulse: 123 116 128 119  Temp:   99 F (37.2 C) 98.2 F (36.8 C)  TempSrc:   Oral Oral  Resp: _0 Height:   6' (1.829 m)   Weight:   155.6 kg (343 lb 0.6 oz)   SpO2: 97% 95% 96% 95%    Last BM Date: 09/05/15  Intake/Output   Yesterday:  03/22 0701 - 03/23 0700 In: 4041 [I.V.:4041] Out: -  This shift:    I/O last 3 completed shifts: In: 4041 [I.V.:4041] Out: -      Physical Exam: General: Pt awake/alert/oriented x4 in no acute distress  Abdomen: Soft.  Nondistended.  Moderate ttp suprapubic region, rlq, ruq.  No evidence of peritonitis.  No incarcerated hernias.    Problem List:   Active Problems:   Diabetes (HCC)   OSA (obstructive sleep apnea)   Hyperkalemia   Hyponatremia   Diverticulitis   Dehydration   Blood per rectum   Metabolic acidosis    Results:   Labs: Results for orders placed or performed during the hospital encounter of 09/06/15 (from the past 48 hour(s))  Lipase, blood     Status: None   Collection Time: 09/06/15  3:55 PM  Result Value Ref Range   Lipase 18 11 - 51 U/L  Comprehensive metabolic panel     Status: Abnormal   Collection Time: 09/06/15  3:55 PM  Result Value Ref Range   Sodium 129 (L) 135 - 145 mmol/L   Potassium 6.9 (HH) 3.5 - 5.1 mmol/L    Comment: HEMOLYSIS AT THIS LEVEL MAY AFFECT RESULT POST-ULTRACENTRIFUGATION CRITICAL RESULT CALLED TO, READ BACK BY AND VERIFIED WITH: N STEPHENSON,RN 1746 09/05/05  WBOND    Chloride 100 (L) 101 - 111 mmol/L   CO2 15 (L) 22 - 32 mmol/L   Glucose, Bld 441 (H) 65 - 99 mg/dL   BUN 7 6 - 20 mg/dL   Creatinine, Ser 0.92 0.61 - 1.24 mg/dL   Calcium 8.8 (L) 8.9 - 10.3 mg/dL   Total Protein 7.2 6.5 - 8.1 g/dL   Albumin 3.8 3.5 - 5.0 g/dL   AST 98 (H) 15 - 41 U/L   ALT 52 17 - 63 U/L   Alkaline Phosphatase 95 38 - 126 U/L   Total Bilirubin 4.2 (H) 0.3 - 1.2 mg/dL   GFR calc non Af Amer >60 >60 mL/min   GFR calc Af Amer >60 >60 mL/min    Comment: (NOTE) The eGFR has been calculated using the CKD EPI equation. This calculation has not been validated in all clinical situations. eGFR's persistently <60 mL/min signify possible Chronic Kidney Disease.    Anion gap 14 5 - 15  CBC     Status: Abnormal   Collection Time: 09/06/15  3:55 PM  Result Value Ref Range   WBC 14.6 (H) 4.0 - 10.5  K/uL   RBC 5.16 4.22 - 5.81 MIL/uL   Hemoglobin 15.4 13.0 - 17.0 g/dL   HCT 42.5 39.0 - 52.0 %   MCV 82.4 78.0 - 100.0 fL   MCH 29.8 26.0 - 34.0 pg   MCHC 36.2 (H) 30.0 - 36.0 g/dL   RDW 15.4 11.5 - 15.5 %   Platelets 179 150 - 400 K/uL    Comment: PLATELET COUNT CONFIRMED BY SMEAR  I-Stat CG4 Lactic Acid, ED     Status: None   Collection Time: 09/06/15  4:05 PM  Result Value Ref Range   Lactic Acid, Venous 1.99 0.5 - 2.0 mmol/L  Basic metabolic panel     Status: Abnormal   Collection Time: 09/06/15  6:16 PM  Result Value Ref Range   Sodium 128 (L) 135 - 145 mmol/L   Potassium >7.5 (HH) 3.5 - 5.1 mmol/L    Comment: POST-ULTRACENTRIFUGATION HEMOLYSIS AT THIS LEVEL MAY AFFECT RESULT CRITICAL RESULT CALLED TO, READ BACK BY AND VERIFIED WITH: Melina Modena 0960 09/06/2015 WBOND    Chloride 101 101 - 111 mmol/L   CO2 15 (L) 22 - 32 mmol/L   Glucose, Bld 334 (H) 65 - 99 mg/dL   BUN 7 6 - 20 mg/dL   Creatinine, Ser 0.88 0.61 - 1.24 mg/dL   Calcium 8.4 (L) 8.9 - 10.3 mg/dL   GFR calc non Af Amer >60 >60 mL/min   GFR calc Af Amer >60 >60 mL/min    Comment:  (NOTE) The eGFR has been calculated using the CKD EPI equation. This calculation has not been validated in all clinical situations. eGFR's persistently <60 mL/min signify possible Chronic Kidney Disease.    Anion gap 12 5 - 15  Hemoglobin A1c     Status: Abnormal   Collection Time: 09/06/15  6:16 PM  Result Value Ref Range   Hgb A1c MFr Bld 13.1 (H) 4.8 - 5.6 %    Comment: (NOTE)         Pre-diabetes: 5.7 - 6.4         Diabetes: >6.4         Glycemic control for adults with diabetes: <7.0    Mean Plasma Glucose 329 mg/dL    Comment: (NOTE) Performed At: [facility] [place], [place] [id number] Lindon Romp MD [id number]   I-Stat CG4 Lactic Acid, ED     Status: None   Collection Time: 09/06/15  6:36 PM  Result Value Ref Range   Lactic Acid, Venous 1.28 0.5 - 2.0 mmol/L  Urinalysis, Routine w reflex microscopic (not at [facility])     Status: Abnormal   Collection Time: 09/06/15  7:24 PM  Result Value Ref Range   Color, Urine YELLOW YELLOW   APPearance CLEAR CLEAR   Specific Gravity, Urine >1.046 (H) 1.005 - 1.030   pH 5.5 5.0 - 8.0   Glucose, UA >1000 (A) NEGATIVE mg/dL   Hgb urine dipstick NEGATIVE NEGATIVE   Bilirubin Urine NEGATIVE NEGATIVE   Ketones, ur >80 (A) NEGATIVE mg/dL   Protein, ur NEGATIVE NEGATIVE mg/dL   Nitrite NEGATIVE NEGATIVE   Leukocytes, UA NEGATIVE NEGATIVE  Urine microscopic-add on     Status: Abnormal   Collection Time: 09/06/15  7:24 PM  Result Value Ref Range   Squamous Epithelial / LPF 0-5 (A) NONE SEEN   WBC, UA NONE SEEN 0 - 5 WBC/hpf   RBC / HPF 0-5 0 - 5 RBC/hpf   Bacteria, UA RARE (A) NONE SEEN  Creatinine, urine, random     Status: None   Collection Time: 09/06/15  8:45 PM  Result Value Ref Range   Creatinine, Urine 94.61 mg/dL  Sodium, urine, random     Status: None   Collection Time: 09/06/15  8:45 PM  Result Value Ref Range   Sodium, Ur 23 mmol/L  Osmolality, urine     Status: Abnormal   Collection  Time: 09/06/15  8:46 PM  Result Value Ref Range   Osmolality, Ur 933 (H) 300 - 900 mOsm/kg  CBG monitoring, ED     Status: Abnormal   Collection Time: 09/06/15  8:57 PM  Result Value Ref Range   Glucose-Capillary 268 (H) 65 - 99 mg/dL  Comprehensive metabolic panel     Status: Abnormal   Collection Time: 09/06/15 10:21 PM  Result Value Ref Range   Sodium 128 (L) 135 - 145 mmol/L    Comment: LIPEMIC SPECIMEN   Potassium 5.9 (H) 3.5 - 5.1 mmol/L    Comment: DELTA CHECK NOTED HEMOLYSIS AT THIS LEVEL MAY AFFECT RESULT LIPEMIC SPECIMEN    Chloride 98 (L) 101 - 111 mmol/L   CO2 19 (L) 22 - 32 mmol/L   Glucose, Bld 285 (H) 65 - 99 mg/dL   BUN 5 (L) 6 - 20 mg/dL   Creatinine, Ser 0.78 0.61 - 1.24 mg/dL    Comment: POST-ULTRACENTRIFUGATION LIPEMIC SPECIMEN    Calcium 8.1 (L) 8.9 - 10.3 mg/dL   Total Protein >12.0 (H) 6.5 - 8.1 g/dL    Comment: HEMOLYSIS AT THIS LEVEL MAY AFFECT RESULT LIPEMIC SPECIMEN POST-ULTRACENTRIFUGATION    Albumin 3.1 (L) 3.5 - 5.0 g/dL   AST 75 (H) 15 - 41 U/L   ALT 18 17 - 63 U/L   Alkaline Phosphatase 76 38 - 126 U/L   Total Bilirubin 3.2 (H) 0.3 - 1.2 mg/dL   GFR calc non Af Amer >60 >60 mL/min   GFR calc Af Amer >60 >60 mL/min    Comment: (NOTE) The eGFR has been calculated using the CKD EPI equation. This calculation has not been validated in all clinical situations. eGFR's persistently <60 mL/min signify possible Chronic Kidney Disease.    Anion gap 11 5 - 15  Glucose, capillary     Status: Abnormal   Collection Time: 09/07/15 12:13 AM  Result Value Ref Range   Glucose-Capillary 267 (H) 65 - 99 mg/dL  Magnesium     Status: None   Collection Time: 09/07/15  3:30 AM  Result Value Ref Range   Magnesium 1.7 1.7 - 2.4 mg/dL  Phosphorus     Status: Abnormal   Collection Time: 09/07/15  3:30 AM  Result Value Ref Range   Phosphorus 2.4 (L) 2.5 - 4.6 mg/dL  TSH     Status: None   Collection Time: 09/07/15  3:30 AM  Result Value Ref Range   TSH  3.244 0.350 - 4.500 uIU/mL  Comprehensive metabolic panel     Status: Abnormal   Collection Time: 09/07/15  3:30 AM  Result Value Ref Range   Sodium 134 (L) 135 - 145 mmol/L    Comment: LIPEMIC SPECIMEN SPECIMEN HEMOLYZED. HEMOLYSIS MAY AFFECT INTEGRITY OF RESULTS. POST-ULTRACENTRIFUGATION    Potassium 4.9 3.5 - 5.1 mmol/L    Comment: DELTA CHECK NOTED   Chloride 102 101 - 111 mmol/L   CO2 23 22 - 32 mmol/L   Glucose, Bld 249 (H) 65 - 99 mg/dL   BUN <5 (L) 6 - 20 mg/dL  Comment: REPEATED TO VERIFY   Creatinine, Ser 0.84 0.61 - 1.24 mg/dL   Calcium 8.4 (L) 8.9 - 10.3 mg/dL   Total Protein 6.1 (L) 6.5 - 8.1 g/dL   Albumin 3.2 (L) 3.5 - 5.0 g/dL   AST 59 (H) 15 - 41 U/L   ALT 36 17 - 63 U/L   Alkaline Phosphatase 78 38 - 126 U/L   Total Bilirubin 2.1 (H) 0.3 - 1.2 mg/dL   GFR calc non Af Amer >60 >60 mL/min   GFR calc Af Amer >60 >60 mL/min    Comment: (NOTE) The eGFR has been calculated using the CKD EPI equation. This calculation has not been validated in all clinical situations. eGFR's persistently <60 mL/min signify possible Chronic Kidney Disease.    Anion gap 9 5 - 15  CBC     Status: Abnormal   Collection Time: 09/07/15  3:30 AM  Result Value Ref Range   WBC 10.9 (H) 4.0 - 10.5 K/uL   RBC 4.66 4.22 - 5.81 MIL/uL   Hemoglobin 13.6 13.0 - 17.0 g/dL   HCT 38.6 (L) 39.0 - 52.0 %   MCV 82.8 78.0 - 100.0 fL   MCH 29.2 26.0 - 34.0 pg   MCHC 35.2 30.0 - 36.0 g/dL   RDW 15.1 11.5 - 15.5 %   Platelets 141 (L) 150 - 400 K/uL  Protime-INR     Status: Abnormal   Collection Time: 09/07/15  3:30 AM  Result Value Ref Range   Prothrombin Time 15.3 (H) 11.6 - 15.2 seconds   INR 1.19 0.00 - 1.49  Glucose, capillary     Status: Abnormal   Collection Time: 09/07/15  4:21 AM  Result Value Ref Range   Glucose-Capillary 189 (H) 65 - 99 mg/dL    Imaging / Studies: Ct Abdomen Pelvis W Contrast  09/06/2015  CLINICAL DATA:  Right lower quadrant pain since Monday, worsening over  the past 2 days. EXAM: CT ABDOMEN AND PELVIS WITH CONTRAST TECHNIQUE: Multidetector CT imaging of the abdomen and pelvis was performed using the standard protocol following bolus administration of intravenous contrast. CONTRAST:  116m OMNIPAQUE IOHEXOL 300 MG/ML  SOLN COMPARISON:  11/17/2014 FINDINGS: The lung bases are clear.  Focal coronary artery calcifications. Prominent diffuse fatty infiltration of the liver with enlarged liver. The gallbladder, spleen, pancreas, adrenal glands, kidneys, abdominal aorta, inferior vena cava, and retroperitoneal lymph nodes are unremarkable. Stomach, small bowel, and colon are not abnormally distended. No free air or free fluid in the abdomen. Small periumbilical hernia containing fat. Pelvis: The sigmoid colon swings over to the right side of the pelvis. In the right sigmoid colon, there is focal wall thickening associated with multiple diverticula. Infiltration in the pelvic fat around the sigmoid colon and extending to the right lower quadrant. Changes are consistent with acute diverticulitis. Appearance is similar to previous study. No abscess is identified but extraluminal gas is present adjacent to the Foley. Colonic wall thickening may be due to inflammatory process although underlying neoplasm should be excluded and evaluation the colon after resolution of acute process is suggested. There is associated inflammatory wall thickening of the adjacent terminal ileum. Scattered mesenteric lymph nodes are present. These are likely reactive. The appendix is retrocecal and is thickened since previous study, with diameter now measuring 11 mm. Reactive or coexisting appendicitis may be present. Bladder wall is not thickened. Prostate gland is not enlarged. No free or loculated pelvic fluid collections. No destructive bone lesions. IMPRESSION: Persistent or recurrent diverticulitis  involving the sigmoid colon. Colonic wall thickening may be due to inflammation although on  underlying colon lesion is not excluded. Edema and stranding around the sigmoid colon. No discrete abscess. There is reactive inflammatory wall thickening of the adjacent terminal ileum. The appendix is also dilated and reactive or coexisting appendicitis is not excluded. Diffuse fatty infiltration of the liver. Electronically Signed   By: Lucienne Capers M.D.   On: 09/06/2015 19:03    Medications / Allergies:  Scheduled Meds: . ciprofloxacin  400 mg Intravenous Q12H  . insulin aspart  0-9 Units Subcutaneous 6 times per day  . insulin aspart  10 Units Intravenous Once  . metronidazole  500 mg Intravenous Q8H  . sodium chloride flush  3 mL Intravenous Q12H   Continuous Infusions: . sodium chloride 150 mL/hr at 09/06/15 2331   PRN Meds:.acetaminophen **OR** acetaminophen, HYDROcodone-acetaminophen, morphine injection, ondansetron **OR** ondansetron (ZOFRAN) IV  Antibiotics: Anti-infectives    Start     Dose/Rate Route Frequency Ordered Stop   09/07/15 0800  ciprofloxacin (CIPRO) IVPB 400 mg     400 mg 200 mL/hr over 60 Minutes Intravenous Every 12 hours 09/06/15 2309     09/07/15 0600  metroNIDAZOLE (FLAGYL) IVPB 500 mg     500 mg 100 mL/hr over 60 Minutes Intravenous Every 8 hours 09/06/15 2309     09/06/15 1930  ciprofloxacin (CIPRO) IVPB 400 mg     400 mg 200 mL/hr over 60 Minutes Intravenous  Once 09/06/15 1920 09/06/15 2201   09/06/15 1930  metroNIDAZOLE (FLAGYL) IVPB 500 mg     500 mg 100 mL/hr over 60 Minutes Intravenous  Once 09/06/15 1920 09/06/15 2330        Assessment/Plan Recurrent sigmoid diverticulitis v appendicitis -WBC down afebrile, tenderness is unchanged.  Likely continue with bowel rest and IV antibiotics.  Dr. Ninfa Linden to evaluate soon for final recommendations.  ID-change cipro to rocephin, continue flagyl FEN-NPO, IVF, pain control VTE prophylaxis-SCD, add lovenox     Erby Pian, ANP-BC [facility] Pager [phone](7A-4:30P) For  consults and floor pages call [phone](7A-4:30P)  09/07/2015 7:55 AM

## 2015-09-07 NOTE — Progress Notes (Signed)
[facility] PROGRESS NOTE  Spencer Good [id number] DOB: 02-21-1976 DOA: 09/06/2015 PCP: Kathlene November, MD  Assessment/Plan:  Diverticulitis versus appendicitis- continue with ciprofloxacin, Flagyl. General surgery following. Patient will be kept nothing by mouth for possible diagnostic laparoscope in a.m.  Hyperkalemia- resolved, today potassium is 4.9.  Blood per rectum- likely diverticular. Hemoglobin is 13.6 this morning.  Hyponatremia- resolved, likely from dehydration.  DVT prophylaxis- Lovenox    Code Status: Full code Family Communication: No family at bedside Disposition Plan: Home when medically stable   Consultants:  General surgery  Procedures:  None  Antibiotics: Ciprofloxacin Flagyl  HPI/Subjective: 39yM with abdominal pain. Gradual onset yesterday. It feels like "there is a midget in there running around with a Tiki torch." Constant and progressive since yesterday. Has a past history of diverticulitis and says it feels similar. Reports pain was also R sided with previous sigmoid diverticulitis. No appreciable exacerbating or relieving factors. No urinary complaints. Has felt hot/sweaty, but "I'm a fat guy and I always sweat a lot. Anorexia. No n/v. Abdominal surgical hx significant for umbilical hernia repair.  This morning patient still complains of right lower quadrant pain.  Objective: Filed Vitals:   09/07/15 0659 09/07/15 1108  BP: 119/79 146/85  Pulse: 119 115  Temp: 98.2 F (36.8 C) 98.6 F (37 C)  Resp: 21 20    Intake/Output Summary (Last 24 hours) at 09/07/15 1507 Last data filed at 09/07/15 1048  Gross per 24 hour  Intake   4041 ml  Output    725 ml  Net   3316 ml   Filed Weights   09/06/15 2326  Weight: 155.6 kg (343 lb 0.6 oz)    Exam:   General:  Appears in no acute distress  Cardiovascular: S1-S2 regular  Respiratory: Clear to auscultation bilaterally  Abdomen: Soft, nontender, no  organomegaly  Musculoskeletal: No cyanosis/clubbing/edema of lower extremities   Data Reviewed: Basic Metabolic Panel:  Recent Labs Lab 09/06/15 1555 09/06/15 1816 09/06/15 2221 09/07/15 0330  NA 129* 128* 128* 134*  K 6.9* >7.5* 5.9* 4.9  CL 100* 101 98* 102  CO2 15* 15* 19* 23  GLUCOSE 441* 334* 285* 249*  BUN 7 7 5* <5*  CREATININE 0.92 0.88 0.78 0.84  CALCIUM 8.8* 8.4* 8.1* 8.4*  MG  --   --   --  1.7  PHOS  --   --   --  2.4*   Liver Function Tests:  Recent Labs Lab 09/06/15 1555 09/06/15 2221 09/07/15 0330  AST 98* 75* 59*  ALT 52 18 36  ALKPHOS 95 76 78  BILITOT 4.2* 3.2* 2.1*  PROT 7.2 >12.0* 6.1*  ALBUMIN 3.8 3.1* 3.2*    Recent Labs Lab 09/06/15 1555  LIPASE 18   CBC:  Recent Labs Lab 09/06/15 1555 09/07/15 0330  WBC 14.6* 10.9*  HGB 15.4 13.6  HCT 42.5 38.6*  MCV 82.4 82.8  PLT 179 141*    CBG:  Recent Labs Lab 09/06/15 2057 09/07/15 0013 09/07/15 0421 09/07/15 0816 09/07/15 1208  GLUCAP 268* 267* 189* 219* 241*    No results found for this or any previous visit (from the past 240 hour(s)).   Studies: Ct Abdomen Pelvis W Contrast  09/06/2015  CLINICAL DATA:  Right lower quadrant pain since Monday, worsening over the past 2 days. EXAM: CT ABDOMEN AND PELVIS WITH CONTRAST TECHNIQUE: Multidetector CT imaging of the abdomen and pelvis was performed using the standard protocol following bolus administration of intravenous contrast. CONTRAST:  153mL OMNIPAQUE IOHEXOL 300 MG/ML  SOLN COMPARISON:  11/17/2014 FINDINGS: The lung bases are clear.  Focal coronary artery calcifications. Prominent diffuse fatty infiltration of the liver with enlarged liver. The gallbladder, spleen, pancreas, adrenal glands, kidneys, abdominal aorta, inferior vena cava, and retroperitoneal lymph nodes are unremarkable. Stomach, small bowel, and colon are not abnormally distended. No free air or free fluid in the abdomen. Small periumbilical hernia containing fat.  Pelvis: The sigmoid colon swings over to the right side of the pelvis. In the right sigmoid colon, there is focal wall thickening associated with multiple diverticula. Infiltration in the pelvic fat around the sigmoid colon and extending to the right lower quadrant. Changes are consistent with acute diverticulitis. Appearance is similar to previous study. No abscess is identified but extraluminal gas is present adjacent to the Foley. Colonic wall thickening may be due to inflammatory process although underlying neoplasm should be excluded and evaluation the colon after resolution of acute process is suggested. There is associated inflammatory wall thickening of the adjacent terminal ileum. Scattered mesenteric lymph nodes are present. These are likely reactive. The appendix is retrocecal and is thickened since previous study, with diameter now measuring 11 mm. Reactive or coexisting appendicitis may be present. Bladder wall is not thickened. Prostate gland is not enlarged. No free or loculated pelvic fluid collections. No destructive bone lesions. IMPRESSION: Persistent or recurrent diverticulitis involving the sigmoid colon. Colonic wall thickening may be due to inflammation although on underlying colon lesion is not excluded. Edema and stranding around the sigmoid colon. No discrete abscess. There is reactive inflammatory wall thickening of the adjacent terminal ileum. The appendix is also dilated and reactive or coexisting appendicitis is not excluded. Diffuse fatty infiltration of the liver. Electronically Signed   By: Lucienne Capers M.D.   On: 09/06/2015 19:03    Scheduled Meds: . cefTRIAXone (ROCEPHIN)  IV  2 g Intravenous Daily  . enoxaparin (LOVENOX) injection  40 mg Subcutaneous Q24H  . insulin aspart  0-9 Units Subcutaneous 6 times per day  . insulin aspart  10 Units Intravenous Once  . metronidazole  500 mg Intravenous Q8H  . sodium chloride flush  3 mL Intravenous Q12H   Continuous  Infusions:   Active Problems:   Diabetes (HCC)   OSA (obstructive sleep apnea)   Hyperkalemia   Hyponatremia   Diverticulitis   Dehydration   Blood per rectum   Metabolic acidosis    Time spent: 25 min    [facility]  [facility] Pager [phone]*. If 7PM-7AM, please contact night-coverage at [email or web address], password [facility] 09/07/2015, 3:07 PM  LOS: 1 day

## 2015-09-08 ENCOUNTER — Encounter (HOSPITAL_COMMUNITY): Payer: Self-pay | Admitting: General Practice

## 2015-09-08 LAB — CBC
HCT: 38.2 % — ABNORMAL LOW (ref 39.0–52.0)
Hemoglobin: 12.8 g/dL — ABNORMAL LOW (ref 13.0–17.0)
MCH: 27.7 pg (ref 26.0–34.0)
MCHC: 33.5 g/dL (ref 30.0–36.0)
MCV: 82.7 fL (ref 78.0–100.0)
Platelets: 125 10*3/uL — ABNORMAL LOW (ref 150–400)
RBC: 4.62 MIL/uL (ref 4.22–5.81)
RDW: 15.5 % (ref 11.5–15.5)
WBC: 11.2 10*3/uL — ABNORMAL HIGH (ref 4.0–10.5)

## 2015-09-08 LAB — GLUCOSE, CAPILLARY
Glucose-Capillary: 172 mg/dL — ABNORMAL HIGH (ref 65–99)
Glucose-Capillary: 179 mg/dL — ABNORMAL HIGH (ref 65–99)
Glucose-Capillary: 181 mg/dL — ABNORMAL HIGH (ref 65–99)
Glucose-Capillary: 200 mg/dL — ABNORMAL HIGH (ref 65–99)
Glucose-Capillary: 200 mg/dL — ABNORMAL HIGH (ref 65–99)
Glucose-Capillary: 205 mg/dL — ABNORMAL HIGH (ref 65–99)

## 2015-09-08 LAB — HEMOGLOBIN A1C
Hgb A1c MFr Bld: 13.9 % — ABNORMAL HIGH (ref 4.8–5.6)
Mean Plasma Glucose: 352 mg/dL

## 2015-09-08 MED ORDER — SODIUM CHLORIDE 0.9 % IV SOLN
INTRAVENOUS | Status: DC
  Administered 2015-09-08 – 2015-09-10 (×6): via INTRAVENOUS

## 2015-09-08 MED ORDER — INSULIN GLARGINE 100 UNIT/ML ~~LOC~~ SOLN
10.0000 [IU] | Freq: Every day | SUBCUTANEOUS | Status: DC
  Administered 2015-09-08 – 2015-09-14 (×7): 10 [IU] via SUBCUTANEOUS
  Filled 2015-09-08 (×8): qty 0.1

## 2015-09-08 MED ORDER — INSULIN GLARGINE 100 UNITS/ML SOLOSTAR PEN: 10.0000 [IU] | PEN_INJECTOR | Freq: Every day | SUBCUTANEOUS | Status: DC

## 2015-09-08 MED ORDER — ENOXAPARIN SODIUM 80 MG/0.8ML ~~LOC~~ SOLN
80.0000 mg | SUBCUTANEOUS | Status: DC
  Administered 2015-09-09 – 2015-09-12 (×4): 80 mg via SUBCUTANEOUS
  Filled 2015-09-08 (×4): qty 0.8

## 2015-09-08 MED ORDER — ZOLPIDEM TARTRATE 5 MG PO TABS
5.0000 mg | ORAL_TABLET | Freq: Every evening | ORAL | Status: DC | PRN
  Administered 2015-09-08 – 2015-09-15 (×7): 5 mg via ORAL
  Filled 2015-09-08 (×7): qty 1

## 2015-09-08 NOTE — Progress Notes (Signed)
[facility] PROGRESS NOTE  Spencer Good [id number] DOB: 1976/05/11 DOA: 09/06/2015 PCP: Kathlene November, MD  Assessment/Plan:  Diverticulitis versus appendicitis- continue with ciprofloxacin, Flagyl. General surgery following. Patient will be kept nothing by mouth for possible diagnostic laparoscope in a.m.  Hyperkalemia- resolved,  potassium is 4.9. Check bmp in am.  Blood per rectum- likely diverticular. Hemoglobin is 13.6   Diabetes mellitus- start sliding scale insulin with NovoLog, will start Lantus 10 units subcutaneous daily.  Hyponatremia- resolved, likely from dehydration.  DVT prophylaxis- Lovenox    Code Status: Full code Family Communication: No family at bedside Disposition Plan: Home when medically stable   Consultants:  General surgery  Procedures:  None  Antibiotics: Ciprofloxacin Flagyl  HPI/Subjective: 39yM with abdominal pain. Gradual onset yesterday. It feels like "there is a midget in there running around with a Tiki torch." Constant and progressive since yesterday. Has a past history of diverticulitis and says it feels similar. Reports pain was also R sided with previous sigmoid diverticulitis. No appreciable exacerbating or relieving factors. No urinary complaints. Has felt hot/sweaty, but "I'm a fat guy and I always sweat a lot. Anorexia. No n/v. Abdominal surgical hx significant for umbilical hernia repair.  This morning patient still complains of right lower quadrant pain. No surgery planned for today.  Objective: Filed Vitals:   09/08/15 0559 09/08/15 1330  BP: 144/83 154/95  Pulse: 110 110  Temp: 98.3 F (36.8 C) 98.3 F (36.8 C)  Resp: 21 20    Intake/Output Summary (Last 24 hours) at 09/08/15 1533 Last data filed at 09/08/15 1447  Gross per 24 hour  Intake    960 ml  Output    450 ml  Net    510 ml   Filed Weights   09/06/15 2326  Weight: 155.6 kg (343 lb 0.6 oz)    Exam:   General:  Appears in no acute  distress  Cardiovascular: S1-S2 regular  Respiratory: Clear to auscultation bilaterally  Abdomen: Soft, nontender, no organomegaly  Musculoskeletal: No cyanosis/clubbing/edema of lower extremities   Data Reviewed: Basic Metabolic Panel:  Recent Labs Lab 09/06/15 1555 09/06/15 1816 09/06/15 2221 09/07/15 0330  NA 129* 128* 128* 134*  K 6.9* >7.5* 5.9* 4.9  CL 100* 101 98* 102  CO2 15* 15* 19* 23  GLUCOSE 441* 334* 285* 249*  BUN 7 7 5* <5*  CREATININE 0.92 0.88 0.78 0.84  CALCIUM 8.8* 8.4* 8.1* 8.4*  MG  --   --   --  1.7  PHOS  --   --   --  2.4*   Liver Function Tests:  Recent Labs Lab 09/06/15 1555 09/06/15 2221 09/07/15 0330  AST 98* 75* 59*  ALT 52 18 36  ALKPHOS 95 76 78  BILITOT 4.2* 3.2* 2.1*  PROT 7.2 >12.0* 6.1*  ALBUMIN 3.8 3.1* 3.2*    Recent Labs Lab 09/06/15 1555  LIPASE 18   CBC:  Recent Labs Lab 09/06/15 1555 09/07/15 0330 09/08/15 0753  WBC 14.6* 10.9* 11.2*  HGB 15.4 13.6 12.8*  HCT 42.5 38.6* 38.2*  MCV 82.4 82.8 82.7  PLT 179 141* 125*    CBG:  Recent Labs Lab 09/07/15 2010 09/08/15 0043 09/08/15 0551 09/08/15 0718 09/08/15 1208  GLUCAP 246* 179* 200* 205* 181*    No results found for this or any previous visit (from the past 240 hour(s)).   Studies: Ct Abdomen Pelvis W Contrast  09/06/2015  CLINICAL DATA:  Right lower quadrant pain since Monday, worsening over  the past 2 days. EXAM: CT ABDOMEN AND PELVIS WITH CONTRAST TECHNIQUE: Multidetector CT imaging of the abdomen and pelvis was performed using the standard protocol following bolus administration of intravenous contrast. CONTRAST:  126mL OMNIPAQUE IOHEXOL 300 MG/ML  SOLN COMPARISON:  11/17/2014 FINDINGS: The lung bases are clear.  Focal coronary artery calcifications. Prominent diffuse fatty infiltration of the liver with enlarged liver. The gallbladder, spleen, pancreas, adrenal glands, kidneys, abdominal aorta, inferior vena cava, and retroperitoneal lymph nodes  are unremarkable. Stomach, small bowel, and colon are not abnormally distended. No free air or free fluid in the abdomen. Small periumbilical hernia containing fat. Pelvis: The sigmoid colon swings over to the right side of the pelvis. In the right sigmoid colon, there is focal wall thickening associated with multiple diverticula. Infiltration in the pelvic fat around the sigmoid colon and extending to the right lower quadrant. Changes are consistent with acute diverticulitis. Appearance is similar to previous study. No abscess is identified but extraluminal gas is present adjacent to the Foley. Colonic wall thickening may be due to inflammatory process although underlying neoplasm should be excluded and evaluation the colon after resolution of acute process is suggested. There is associated inflammatory wall thickening of the adjacent terminal ileum. Scattered mesenteric lymph nodes are present. These are likely reactive. The appendix is retrocecal and is thickened since previous study, with diameter now measuring 11 mm. Reactive or coexisting appendicitis may be present. Bladder wall is not thickened. Prostate gland is not enlarged. No free or loculated pelvic fluid collections. No destructive bone lesions. IMPRESSION: Persistent or recurrent diverticulitis involving the sigmoid colon. Colonic wall thickening may be due to inflammation although on underlying colon lesion is not excluded. Edema and stranding around the sigmoid colon. No discrete abscess. There is reactive inflammatory wall thickening of the adjacent terminal ileum. The appendix is also dilated and reactive or coexisting appendicitis is not excluded. Diffuse fatty infiltration of the liver. Electronically Signed   By: Lucienne Capers M.D.   On: 09/06/2015 19:03    Scheduled Meds: . cefTRIAXone (ROCEPHIN)  IV  2 g Intravenous Daily  . [START ON 09/09/2015] enoxaparin (LOVENOX) injection  80 mg Subcutaneous Q24H  . insulin aspart  0-9 Units  Subcutaneous 6 times per day  . insulin aspart  10 Units Intravenous Once  . metronidazole  500 mg Intravenous Q8H  . sodium chloride flush  3 mL Intravenous Q12H   Continuous Infusions: . sodium chloride 125 mL/hr at 09/08/15 [id number]    Active Problems:   Diabetes (HCC)   OSA (obstructive sleep apnea)   Hyperkalemia   Hyponatremia   Diverticulitis   Dehydration   Blood per rectum   Metabolic acidosis    Time spent: 25 min    [facility]  [facility] Pager [phone]*. If 7PM-7AM, please contact night-coverage at [email or web address], password [facility] 09/08/2015, 3:33 PM  LOS: 2 days

## 2015-09-08 NOTE — Progress Notes (Signed)
Inpatient Diabetes Program Recommendations  AACE/ADA: New Consensus Statement on Inpatient Glycemic Control (2015)  Target Ranges:  Prepandial:   less than 140 mg/dL      Peak postprandial:   less than 180 mg/dL (1-2 hours)      Critically ill patients:  140 - 180 mg/dL   Review of Glycemic Control:  Results for Spencer Good, Spencer Good (MRN [id number]) as of 09/08/2015 13:47  Ref. Range 09/07/2015 20:10 09/08/2015 00:43 09/08/2015 05:51 09/08/2015 07:18 09/08/2015 12:08  Glucose-Capillary Latest Ref Range: 65-99 mg/dL 246 (H) 179 (H) 200 (H) 205 (H) 181 (H)  Results for Spencer Good, Spencer Good (MRN [id number]) as of 09/08/2015 13:47  Ref. Range 01/24/2015 09:41 09/06/2015 18:16  Hemoglobin A1C Latest Ref Range: 4.8-5.6 % 6.5 (H) 13.1 (H)   Diabetes history: Type 2 diabetes Outpatient Diabetes medications: None-  See note from Diabetes Coordinator on 09/07/15.  Patient stopped taking diabetes medications due to blood sugars being good. Current orders for Inpatient glycemic control:  Novolog sensitive q 4 hours  Inpatient Diabetes Program Recommendations:  A1C markedly increased over past 7 months.  Diabetes Coordinator spoke with patient on 09/07/15 regarding lifestyle modifications. Based on A1C, patient's blood sugars have been very high over the past 3 months.  May need basal insulin added at discharge.  Consider adding Lantus 25 units daily to cover basal insulin needs.  He likely needs insulin at home also based on A1C.  If insulin is to be ordered for home, he will need education by bedside RN on insulin administration.  Thanks, Adah Perl, RN, BC-ADM Inpatient Diabetes Coordinator Pager [phone] (8a-5p)

## 2015-09-08 NOTE — Progress Notes (Signed)
Subjective: Patient reports less pain  Had BM's  Objective: Vital signs in last 24 hours: Temp:  [98.3 F (36.8 C)-98.9 F (37.2 C)] 98.3 F (36.8 C) (03/24 0559) Pulse Rate:  [110-115] 110 (03/24 0559) Resp:  [20-22] 21 (03/24 0559) BP: (136-146)/(71-85) 144/83 mmHg (03/24 0559) SpO2:  [92 %-97 %] 97 % (03/24 0559) Last BM Date: 09/07/15  Intake/Output from previous day: 03/23 0701 - 03/24 0700 In: 480 [P.O.:480] Out: 1175 [Urine:1175] Intake/Output this shift:    Abdomen still moderately tender from midline to RLQ  Lab Results:   Recent Labs  09/06/15 1555 09/07/15 0330  WBC 14.6* 10.9*  HGB 15.4 13.6  HCT 42.5 38.6*  PLT 179 141*   BMET  Recent Labs  09/06/15 2221 09/07/15 0330  NA 128* 134*  K 5.9* 4.9  CL 98* 102  CO2 19* 23  GLUCOSE 285* 249*  BUN 5* <5*  CREATININE 0.78 0.84  CALCIUM 8.1* 8.4*   PT/INR  Recent Labs  09/07/15 0330  LABPROT 15.3*  INR 1.19   ABG No results for input(s): PHART, HCO3 in the last 72 hours.  Invalid input(s): PCO2, PO2  Studies/Results: Ct Abdomen Pelvis W Contrast  09/06/2015  CLINICAL DATA:  Right lower quadrant pain since Monday, worsening over the past 2 days. EXAM: CT ABDOMEN AND PELVIS WITH CONTRAST TECHNIQUE: Multidetector CT imaging of the abdomen and pelvis was performed using the standard protocol following bolus administration of intravenous contrast. CONTRAST:  134mL OMNIPAQUE IOHEXOL 300 MG/ML  SOLN COMPARISON:  11/17/2014 FINDINGS: The lung bases are clear.  Focal coronary artery calcifications. Prominent diffuse fatty infiltration of the liver with enlarged liver. The gallbladder, spleen, pancreas, adrenal glands, kidneys, abdominal aorta, inferior vena cava, and retroperitoneal lymph nodes are unremarkable. Stomach, small bowel, and colon are not abnormally distended. No free air or free fluid in the abdomen. Small periumbilical hernia containing fat. Pelvis: The sigmoid colon swings over to the  right side of the pelvis. In the right sigmoid colon, there is focal wall thickening associated with multiple diverticula. Infiltration in the pelvic fat around the sigmoid colon and extending to the right lower quadrant. Changes are consistent with acute diverticulitis. Appearance is similar to previous study. No abscess is identified but extraluminal gas is present adjacent to the Foley. Colonic wall thickening may be due to inflammatory process although underlying neoplasm should be excluded and evaluation the colon after resolution of acute process is suggested. There is associated inflammatory wall thickening of the adjacent terminal ileum. Scattered mesenteric lymph nodes are present. These are likely reactive. The appendix is retrocecal and is thickened since previous study, with diameter now measuring 11 mm. Reactive or coexisting appendicitis may be present. Bladder wall is not thickened. Prostate gland is not enlarged. No free or loculated pelvic fluid collections. No destructive bone lesions. IMPRESSION: Persistent or recurrent diverticulitis involving the sigmoid colon. Colonic wall thickening may be due to inflammation although on underlying colon lesion is not excluded. Edema and stranding around the sigmoid colon. No discrete abscess. There is reactive inflammatory wall thickening of the adjacent terminal ileum. The appendix is also dilated and reactive or coexisting appendicitis is not excluded. Diffuse fatty infiltration of the liver. Electronically Signed   By: Lucienne Capers M.D.   On: 09/06/2015 19:03    Anti-infectives: Anti-infectives    Start     Dose/Rate Route Frequency Ordered Stop   09/07/15 0900  cefTRIAXone (ROCEPHIN) 2 g in dextrose 5 % 50 mL IVPB  Comments:  Pharmacy may adjust dosing strength / duration / interval for maximal efficacy   2 g 100 mL/hr over 30 Minutes Intravenous Daily 09/07/15 0754     09/07/15 0800  ciprofloxacin (CIPRO) IVPB 400 mg  Status:   Discontinued     400 mg 200 mL/hr over 60 Minutes Intravenous Every 12 hours 09/06/15 2309 09/07/15 0754   09/07/15 0600  metroNIDAZOLE (FLAGYL) IVPB 500 mg     500 mg 100 mL/hr over 60 Minutes Intravenous Every 8 hours 09/06/15 2309     09/06/15 1930  ciprofloxacin (CIPRO) IVPB 400 mg     400 mg 200 mL/hr over 60 Minutes Intravenous  Once 09/06/15 1920 09/06/15 2201   09/06/15 1930  metroNIDAZOLE (FLAGYL) IVPB 500 mg     500 mg 100 mL/hr over 60 Minutes Intravenous  Once 09/06/15 1920 09/06/15 2330      Assessment/Plan: s/p * No surgery found *  Diverticulitis  Will continue IV antibiotics.  Seems to be improving so will hold on surgery. Resume clear liquids  LOS: 2 days    Natara Monfort A 09/08/2015

## 2015-09-09 LAB — GLUCOSE, CAPILLARY
Glucose-Capillary: 150 mg/dL — ABNORMAL HIGH (ref 65–99)
Glucose-Capillary: 161 mg/dL — ABNORMAL HIGH (ref 65–99)
Glucose-Capillary: 173 mg/dL — ABNORMAL HIGH (ref 65–99)
Glucose-Capillary: 175 mg/dL — ABNORMAL HIGH (ref 65–99)
Glucose-Capillary: 179 mg/dL — ABNORMAL HIGH (ref 65–99)
Glucose-Capillary: 185 mg/dL — ABNORMAL HIGH (ref 65–99)
Glucose-Capillary: 194 mg/dL — ABNORMAL HIGH (ref 65–99)

## 2015-09-09 MED ORDER — SODIUM CHLORIDE 0.9 % IV SOLN
1.0000 g | INTRAVENOUS | Status: DC
  Administered 2015-09-09 – 2015-09-15 (×7): 1 g via INTRAVENOUS
  Filled 2015-09-09 (×8): qty 1

## 2015-09-09 NOTE — Progress Notes (Signed)
  Subjective: Passing liquid stool and flatus.   Says he still has right-sided pain that has not gotten any better over the last 24 hours.  No chills.  Ambulating in room. Maximum  temperature 100.0.  Heart rate 105.  BP 147/78. No record of stool or urine output visible.  Reordered strict intake and output. CT reviewed.  Agree most likely diagnosis is sigmoid diverticulitis with sigmoid colon redundant and residing in right lower quadrant.  Appendix is dilated but appendicitis seems less likely.  No sign of perforation or abscess.  Objective: Vital signs in last 24 hours: Temp:  [98.3 F (36.8 C)-100 F (37.8 C)] 100 F (37.8 C) (03/25 0448) Pulse Rate:  [105-110] 105 (03/25 0448) Resp:  [19-20] 19 (03/25 0448) BP: (134-154)/(78-95) 147/78 mmHg (03/25 0448) SpO2:  [97 %-98 %] 98 % (03/25 0448) Last BM Date: 09/07/15  Intake/Output from previous day: 03/24 0701 - 03/25 0700 In: 3405 [P.O.:1380; I.V.:2025] Out: -  Intake/Output this shift:    General appearance: Alert.  Cooperative.  Minimal distress.  Does not look toxic.  Large man. Resp: clear to auscultation bilaterally GI: Tender with guarding localized to right lower quadrant area.  No mass.  Seems fairly soft elsewhere.  Lab Results:   Recent Labs  09/07/15 0330 09/08/15 0753  WBC 10.9* 11.2*  HGB 13.6 12.8*  HCT 38.6* 38.2*  PLT 141* 125*   BMET  Recent Labs  09/06/15 2221 09/07/15 0330  NA 128* 134*  K 5.9* 4.9  CL 98* 102  CO2 19* 23  GLUCOSE 285* 249*  BUN 5* <5*  CREATININE 0.78 0.84  CALCIUM 8.1* 8.4*   PT/INR  Recent Labs  09/07/15 0330  LABPROT 15.3*  INR 1.19   ABG No results for input(s): PHART, HCO3 in the last 72 hours.  Invalid input(s): PCO2, PO2  Studies/Results: No results found.  Anti-infectives: Anti-infectives    Start     Dose/Rate Route Frequency Ordered Stop   09/07/15 0900  cefTRIAXone (ROCEPHIN) 2 g in dextrose 5 % 50 mL IVPB    Comments:  Pharmacy may adjust  dosing strength / duration / interval for maximal efficacy   2 g 100 mL/hr over 30 Minutes Intravenous Daily 09/07/15 0754     09/07/15 0800  ciprofloxacin (CIPRO) IVPB 400 mg  Status:  Discontinued     400 mg 200 mL/hr over 60 Minutes Intravenous Every 12 hours 09/06/15 2309 09/07/15 0754   09/07/15 0600  metroNIDAZOLE (FLAGYL) IVPB 500 mg     500 mg 100 mL/hr over 60 Minutes Intravenous Every 8 hours 09/06/15 2309     09/06/15 1930  ciprofloxacin (CIPRO) IVPB 400 mg     400 mg 200 mL/hr over 60 Minutes Intravenous  Once 09/06/15 1920 09/06/15 2201   09/06/15 1930  metroNIDAZOLE (FLAGYL) IVPB 500 mg     500 mg 100 mL/hr over 60 Minutes Intravenous  Once 09/06/15 1920 09/06/15 2330      Assessment/Plan:   Acute diverticulitis.  Seems more likely than appendicitis.  First episode by history. We will intensify antibiotics and switched to Invanz because of persistent pain and tenderness and failure to progress over the last 24 hours Continue clear liquids Ambulate in hall Labs tomorrow Repeat CT scan if fails to progress  Diabetes mellitus.  Being managed with SSI and Lantus. Hyperkalemia, resolved DVT prophylaxis--Lovenox      LOS: 3 days    Kenton Fortin M 09/09/2015

## 2015-09-09 NOTE — Progress Notes (Signed)
[facility] PROGRESS NOTE  Spencer Good [id number] DOB: [birth year] DOA: 09/06/2015 PCP: Kathlene November, MD  Assessment/Plan:  Diverticulitis versus appendicitis- continue with ciprofloxacin, Flagyl. General surgery following. No plans for surgery yet.  Hyperkalemia- resolved,  potassium is 4.9. Check bmp in am.  Blood per rectum- likely diverticular. Hemoglobin is 12.8   Diabetes mellitus- start sliding scale insulin with NovoLog, will start Lantus 10 units subcutaneous daily.  Hyponatremia- resolved, likely from dehydration.  DVT prophylaxis- Lovenox    Code Status: Full code Family Communication: No family at bedside Disposition Plan: Home when medically stable   Consultants:  General surgery  Procedures:  None  Antibiotics: Ciprofloxacin Flagyl  HPI/Subjective: 39yM with abdominal pain. Gradual onset yesterday. It feels like "there is a midget in there running around with a Tiki torch." Constant and progressive since yesterday. Has a past history of diverticulitis and says it feels similar. Reports pain was also R sided with previous sigmoid diverticulitis. No appreciable exacerbating or relieving factors. No urinary complaints. Has felt hot/sweaty, but "I'm a fat guy and I always sweat a lot. Anorexia. No n/v. Abdominal surgical hx significant for umbilical hernia repair.  This morning patient still complains of right lower quadrant pain. No surgery planned for today. Patient to be reevaluated in a.m. by surgery  Objective: Filed Vitals:   09/08/15 1954 09/09/15 0448  BP: 134/80 147/78  Pulse: 105 105  Temp: 99.8 F (37.7 C) 100 F (37.8 C)  Resp: 19 19    Intake/Output Summary (Last 24 hours) at 09/09/15 1517 Last data filed at 09/09/15 1107  Gross per 24 hour  Intake   3285 ml  Output   1000 ml  Net   2285 ml   Filed Weights   09/06/15 2326 09/09/15 0913  Weight: 155.6 kg (343 lb 0.6 oz) 158.4 kg (349 lb 3.3 oz)    Exam:   General:   Appears in no acute distress  Cardiovascular: S1-S2 regular  Respiratory: Clear to auscultation bilaterally  Abdomen: Soft, nontender, no organomegaly  Musculoskeletal: No cyanosis/clubbing/edema of lower extremities   Data Reviewed: Basic Metabolic Panel:  Recent Labs Lab 09/06/15 1555 09/06/15 1816 09/06/15 2221 09/07/15 0330  NA 129* 128* 128* 134*  K 6.9* >7.5* 5.9* 4.9  CL 100* 101 98* 102  CO2 15* 15* 19* 23  GLUCOSE 441* 334* 285* 249*  BUN 7 7 5* <5*  CREATININE 0.92 0.88 0.78 0.84  CALCIUM 8.8* 8.4* 8.1* 8.4*  MG  --   --   --  1.7  PHOS  --   --   --  2.4*   Liver Function Tests:  Recent Labs Lab 09/06/15 1555 09/06/15 2221 09/07/15 0330  AST 98* 75* 59*  ALT 52 18 36  ALKPHOS 95 76 78  BILITOT 4.2* 3.2* 2.1*  PROT 7.2 >12.0* 6.1*  ALBUMIN 3.8 3.1* 3.2*    Recent Labs Lab 09/06/15 1555  LIPASE 18   CBC:  Recent Labs Lab 09/06/15 1555 09/07/15 0330 09/08/15 0753  WBC 14.6* 10.9* 11.2*  HGB 15.4 13.6 12.8*  HCT 42.5 38.6* 38.2*  MCV 82.4 82.8 82.7  PLT 179 141* 125*    CBG:  Recent Labs Lab 09/08/15 2001 09/08/15 2328 09/09/15 0444 09/09/15 0746 09/09/15 1127  GLUCAP 200* 161* 185* 179* 194*    No results found for this or any previous visit (from the past 240 hour(s)).   Studies: No results found.  Scheduled Meds: . enoxaparin (LOVENOX) injection  80 mg Subcutaneous  Q24H  . ertapenem  1 g Intravenous Q24H  . insulin aspart  0-9 Units Subcutaneous 6 times per day  . insulin aspart  10 Units Intravenous Once  . insulin glargine  10 Units Subcutaneous QHS  . sodium chloride flush  3 mL Intravenous Q12H   Continuous Infusions: . sodium chloride 125 mL/hr at 09/09/15 [id number]    Active Problems:   Diabetes (HCC)   OSA (obstructive sleep apnea)   Hyperkalemia   Hyponatremia   Diverticulitis   Dehydration   Blood per rectum   Metabolic acidosis    Time spent: 25 min    [facility]  [facility] Pager  [phone]*. If 7PM-7AM, please contact night-coverage at [email or web address], password [facility] 09/09/2015, 3:17 PM  LOS: 3 days

## 2015-09-10 LAB — BASIC METABOLIC PANEL
Anion gap: 8 (ref 5–15)
BUN: <5 mg/dL — ABNORMAL LOW (ref 6–20)
CO2: 25 mmol/L (ref 22–32)
Calcium: 8.2 mg/dL — ABNORMAL LOW (ref 8.9–10.3)
Chloride: 103 mmol/L (ref 101–111)
Creatinine, Ser: 0.69 mg/dL (ref 0.61–1.24)
GFR calc Af Amer: >60 mL/min (ref >60)
GFR calc non Af Amer: >60 mL/min (ref >60)
Glucose, Bld: 193 mg/dL — ABNORMAL HIGH (ref 65–99)
Potassium: 3 mmol/L — ABNORMAL LOW (ref 3.5–5.1)
Sodium: 136 mmol/L (ref 135–145)

## 2015-09-10 LAB — GLUCOSE, CAPILLARY
Glucose-Capillary: 168 mg/dL — ABNORMAL HIGH (ref 65–99)
Glucose-Capillary: 166 mg/dL — ABNORMAL HIGH (ref 65–99)
Glucose-Capillary: 162 mg/dL — ABNORMAL HIGH (ref 65–99)
Glucose-Capillary: 168 mg/dL — ABNORMAL HIGH (ref 65–99)
Glucose-Capillary: 199 mg/dL — ABNORMAL HIGH (ref 65–99)

## 2015-09-10 LAB — CBC
HCT: 35.5 % — ABNORMAL LOW (ref 39.0–52.0)
Hemoglobin: 11.5 g/dL — ABNORMAL LOW (ref 13.0–17.0)
MCH: 26.9 pg (ref 26.0–34.0)
MCHC: 32.4 g/dL (ref 30.0–36.0)
MCV: 83.1 fL (ref 78.0–100.0)
Platelets: 177 10*3/uL (ref 150–400)
RBC: 4.27 MIL/uL (ref 4.22–5.81)
RDW: 15.8 % — ABNORMAL HIGH (ref 11.5–15.5)
WBC: 10.4 10*3/uL (ref 4.0–10.5)

## 2015-09-10 MED ORDER — SODIUM CHLORIDE 0.9 % IV SOLN
INTRAVENOUS | Status: DC
  Administered 2015-09-10 – 2015-09-13 (×5): via INTRAVENOUS
  Filled 2015-09-10 (×12): qty 1000

## 2015-09-10 MED ORDER — POTASSIUM CHLORIDE CRYS ER 20 MEQ PO TBCR
40.0000 meq | EXTENDED_RELEASE_TABLET | Freq: Two times a day (BID) | ORAL | Status: AC
  Administered 2015-09-10 (×2): 40 meq via ORAL
  Filled 2015-09-10 (×2): qty 2

## 2015-09-10 NOTE — Progress Notes (Signed)
[facility] PROGRESS NOTE  Spencer Good [id number] DOB: Oct 18, 1975 DOA: 09/06/2015 PCP: Kathlene November, MD  Assessment/Plan:  Diverticulitis versus appendicitis- continue with ciprofloxacin, Flagyl. General surgery following. No plans for surgery yet.  Hypokalemia-replace potassium and check BMP in a.m.  Blood per rectum- likely diverticular. Hemoglobin is 11.5  Diabetes mellitus-continue sliding scale insulin with NovoLog, Lantus 10 units subcutaneous daily.  Hyponatremia- resolved, likely from dehydration.  DVT prophylaxis- Lovenox    Code Status: Full code Family Communication: No family at bedside Disposition Plan: Home when medically stable   Consultants:  General surgery  Procedures:  None  Antibiotics: Ciprofloxacin Flagyl  HPI/Subjective: 39yM with abdominal pain. Gradual onset yesterday. It feels like "there is a midget in there running around with a Tiki torch." Constant and progressive since yesterday. Has a past history of diverticulitis and says it feels similar. Reports pain was also R sided with previous sigmoid diverticulitis. No appreciable exacerbating or relieving factors. No urinary complaints. Has felt hot/sweaty, but "I'm a fat guy and I always sweat a lot. Anorexia. No n/v. Abdominal surgical hx significant for umbilical hernia repair.  This morning patient still complains of right lower quadrant pain.   Objective: Filed Vitals:   09/09/15 2144 09/10/15 0432  BP: 144/86 139/84  Pulse: 99 96  Temp: 99.1 F (37.3 C) 97.9 F (36.6 C)  Resp: 20 18    Intake/Output Summary (Last 24 hours) at 09/10/15 1443 Last data filed at 09/10/15 0600  Gross per 24 hour  Intake   3895 ml  Output      0 ml  Net   3895 ml   Filed Weights   09/06/15 2326 09/09/15 0913  Weight: 155.6 kg (343 lb 0.6 oz) 158.4 kg (349 lb 3.3 oz)    Exam:   General:  Appears in no acute distress  Cardiovascular: S1-S2 regular  Respiratory: Clear to  auscultation bilaterally  Abdomen: Soft, mild tenderness in right lower quadrant  Musculoskeletal: No cyanosis/clubbing/edema of lower extremities   Data Reviewed: Basic Metabolic Panel:  Recent Labs Lab 09/06/15 1555 09/06/15 1816 09/06/15 2221 09/07/15 0330 09/10/15 0628  NA 129* 128* 128* 134* 136  K 6.9* >7.5* 5.9* 4.9 3.0*  CL 100* 101 98* 102 103  CO2 15* 15* 19* 23 25  GLUCOSE 441* 334* 285* 249* 193*  BUN 7 7 5* <5* <5*  CREATININE 0.92 0.88 0.78 0.84 0.69  CALCIUM 8.8* 8.4* 8.1* 8.4* 8.2*  MG  --   --   --  1.7  --   PHOS  --   --   --  2.4*  --    Liver Function Tests:  Recent Labs Lab 09/06/15 1555 09/06/15 2221 09/07/15 0330  AST 98* 75* 59*  ALT 52 18 36  ALKPHOS 95 76 78  BILITOT 4.2* 3.2* 2.1*  PROT 7.2 >12.0* 6.1*  ALBUMIN 3.8 3.1* 3.2*    Recent Labs Lab 09/06/15 1555  LIPASE 18   CBC:  Recent Labs Lab 09/06/15 1555 09/07/15 0330 09/08/15 0753 09/10/15 0628  WBC 14.6* 10.9* 11.2* 10.4  HGB 15.4 13.6 12.8* 11.5*  HCT 42.5 38.6* 38.2* 35.5*  MCV 82.4 82.8 82.7 83.1  PLT 179 141* 125* 177    CBG:  Recent Labs Lab 09/09/15 1717 09/09/15 2026 09/09/15 2347 09/10/15 0431 09/10/15 0733  GLUCAP 150* 175* 173* 168* 166*    No results found for this or any previous visit (from the past 240 hour(s)).   Studies: No results found.  Scheduled Meds: . enoxaparin (LOVENOX) injection  80 mg Subcutaneous Q24H  . ertapenem  1 g Intravenous Q24H  . insulin aspart  0-9 Units Subcutaneous 6 times per day  . insulin aspart  10 Units Intravenous Once  . insulin glargine  10 Units Subcutaneous QHS  . potassium chloride  40 mEq Oral BID  . sodium chloride flush  3 mL Intravenous Q12H   Continuous Infusions: . sodium chloride 125 mL/hr at 09/10/15 0205  . sodium chloride 0.9 % 1,000 mL with potassium chloride 30 mEq infusion 75 mL/hr at 09/10/15 [id number]    Active Problems:   Diabetes (HCC)   OSA (obstructive sleep apnea)    Hyperkalemia   Hyponatremia   Diverticulitis   Dehydration   Blood per rectum   Metabolic acidosis    Time spent: 25 min    [facility]  [facility] Pager [phone]*. If 7PM-7AM, please contact night-coverage at [email or web address], password [facility] 09/10/2015, 2:43 PM  LOS: 4 days

## 2015-09-10 NOTE — Progress Notes (Signed)
Subjective: Clinically doing well although still has right lower quadrant pain, both with ambulation and at rest. Tolerating liquid diet.  Having bowel movements and passing flatus.  No blood seen in stool. Started on Invanz yesterday. Cipro and Flagyl were discontinued  Labs today show WBC 10,400.  Potassium 3.0.  Creatinine 0.69.  Objective: Vital signs in last 24 hours: Temp:  [97.8 F (36.6 C)-99.1 F (37.3 C)] 97.9 F (36.6 C) (03/26 0432) Pulse Rate:  [96-99] 96 (03/26 0432) Resp:  [18-20] 18 (03/26 0432) BP: (138-144)/(84-87) 139/84 mmHg (03/26 0432) SpO2:  [97 %] 97 % (03/26 0432) Weight:  [158.4 kg (349 lb 3.3 oz)] 158.4 kg (349 lb 3.3 oz) (03/25 0913) Last BM Date: 09/07/15  Intake/Output from previous day: 03/25 0701 - 03/26 0700 In: 4615 [P.O.:1440; I.V.:3125; IV Piggyback:50] Out: 1000 [Urine:1000] Intake/Output this shift:    General appearance: Ambulating in room.  Does not appear to be in any distress.  Large man.  Cooperative. Resp: clear to auscultation bilaterally GI: Obese.  Tender with guarding right lower quadrant persists.  Less tender left lower quadrant.  Upper abdomen soft.  No mass.  Lab Results:   Recent Labs  09/08/15 0753 09/10/15 0628  WBC 11.2* 10.4  HGB 12.8* 11.5*  HCT 38.2* 35.5*  PLT 125* 177   BMET  Recent Labs  09/10/15 0628  NA 136  K 3.0*  CL 103  CO2 25  GLUCOSE 193*  BUN <5*  CREATININE 0.69  CALCIUM 8.2*   PT/INR No results for input(s): LABPROT, INR in the last 72 hours. ABG No results for input(s): PHART, HCO3 in the last 72 hours.  Invalid input(s): PCO2, PO2  Studies/Results: No results found.  Anti-infectives: Anti-infectives    Start     Dose/Rate Route Frequency Ordered Stop   09/09/15 0800  ertapenem (INVANZ) 1 g in sodium chloride 0.9 % 50 mL IVPB     1 g 100 mL/hr over 30 Minutes Intravenous Every 24 hours 09/09/15 0739     09/07/15 0900  cefTRIAXone (ROCEPHIN) 2 g in dextrose 5 % 50 mL  IVPB  Status:  Discontinued    Comments:  Pharmacy may adjust dosing strength / duration / interval for maximal efficacy   2 g 100 mL/hr over 30 Minutes Intravenous Daily 09/07/15 0754 09/09/15 0739   09/07/15 0800  ciprofloxacin (CIPRO) IVPB 400 mg  Status:  Discontinued     400 mg 200 mL/hr over 60 Minutes Intravenous Every 12 hours 09/06/15 2309 09/07/15 0754   09/07/15 0600  metroNIDAZOLE (FLAGYL) IVPB 500 mg  Status:  Discontinued     500 mg 100 mL/hr over 60 Minutes Intravenous Every 8 hours 09/06/15 2309 09/09/15 0739   09/06/15 1930  ciprofloxacin (CIPRO) IVPB 400 mg     400 mg 200 mL/hr over 60 Minutes Intravenous  Once 09/06/15 1920 09/06/15 2201   09/06/15 1930  metroNIDAZOLE (FLAGYL) IVPB 500 mg     500 mg 100 mL/hr over 60 Minutes Intravenous  Once 09/06/15 1920 09/06/15 2330      Assessment/Plan:  Acute diverticulitis. Seems more likely than appendicitis. First episode by history. He is perfectly stable and looks pretty good, although I am still concerned about tenderness and guarding on physical exam  switched to Invanz yesterday because of persistent pain and tenderness and failure to progress over the last 24 hours Continue full liquids Ambulate in hall Suggest CT scan tomorrow unless tenderness significantly improves or resolves Hopefully his persistent pain is due  to slow resolution and not due to developing abscess. Absence of leukocytosis is somewhat encouraging.  Hypokalemia.  I have given him K Dur for 2 doses and adjusted the potassium in his IV fluids.  Diabetes mellitus. Being managed with SSI and Lantus. Hyperkalemia, resolved DVT prophylaxis--Lovenox   LOS: 4 days    Spencer Good 09/10/2015

## 2015-09-11 DIAGNOSIS — E876 Hypokalemia: Secondary | ICD-10-CM

## 2015-09-11 DIAGNOSIS — K5732 Diverticulitis of large intestine without perforation or abscess without bleeding: Secondary | ICD-10-CM

## 2015-09-11 DIAGNOSIS — R1031 Right lower quadrant pain: Secondary | ICD-10-CM

## 2015-09-11 DIAGNOSIS — D62 Acute posthemorrhagic anemia: Secondary | ICD-10-CM

## 2015-09-11 LAB — BASIC METABOLIC PANEL
Anion gap: 10 (ref 5–15)
BUN: <5 mg/dL — ABNORMAL LOW (ref 6–20)
CO2: 24 mmol/L (ref 22–32)
Calcium: 8.3 mg/dL — ABNORMAL LOW (ref 8.9–10.3)
Chloride: 104 mmol/L (ref 101–111)
Creatinine, Ser: 0.65 mg/dL (ref 0.61–1.24)
GFR calc Af Amer: >60 mL/min (ref >60)
GFR calc non Af Amer: >60 mL/min (ref >60)
Glucose, Bld: 202 mg/dL — ABNORMAL HIGH (ref 65–99)
Potassium: 3.4 mmol/L — ABNORMAL LOW (ref 3.5–5.1)
Sodium: 138 mmol/L (ref 135–145)

## 2015-09-11 LAB — GLUCOSE, CAPILLARY
Glucose-Capillary: 172 mg/dL — ABNORMAL HIGH (ref 65–99)
Glucose-Capillary: 170 mg/dL — ABNORMAL HIGH (ref 65–99)
Glucose-Capillary: 185 mg/dL — ABNORMAL HIGH (ref 65–99)
Glucose-Capillary: 217 mg/dL — ABNORMAL HIGH (ref 65–99)

## 2015-09-11 MED ORDER — POTASSIUM CHLORIDE 10 MEQ/100ML IV SOLN
10.0000 meq | INTRAVENOUS | Status: AC
  Administered 2015-09-11 (×3): 10 meq via INTRAVENOUS
  Filled 2015-09-11 (×2): qty 100

## 2015-09-11 MED ORDER — INSULIN ASPART 100 UNIT/ML ~~LOC~~ SOLN
0.0000 [IU] | Freq: Three times a day (TID) | SUBCUTANEOUS | Status: DC
  Administered 2015-09-12: 2 [IU] via SUBCUTANEOUS
  Administered 2015-09-12: 3 [IU] via SUBCUTANEOUS
  Administered 2015-09-12 – 2015-09-13 (×2): 2 [IU] via SUBCUTANEOUS
  Administered 2015-09-14: 1 [IU] via SUBCUTANEOUS
  Administered 2015-09-15: 2 [IU] via SUBCUTANEOUS

## 2015-09-11 MED ORDER — SACCHAROMYCES BOULARDII 250 MG PO CAPS
250.0000 mg | ORAL_CAPSULE | Freq: Two times a day (BID) | ORAL | Status: DC
  Administered 2015-09-11 – 2015-09-15 (×9): 250 mg via ORAL
  Filled 2015-09-11 (×9): qty 1

## 2015-09-11 NOTE — Progress Notes (Signed)
Patient ID: Spencer Good, male   DOB: 07-30-1975, 40 y.o.   MRN: [id number] [facility] PROGRESS NOTE  Spencer Good [id number] DOB: 11-09-1975 DOA: 09/06/2015 PCP: Kathlene November, MD  Brief narrative:    40 year old male with past medical history of diverticulitis, DM who presented to [facility] ED with right lower quadrant abdominal pain started abruptly one day prior to the admission with no specific aggravating or alleviating factors. He also reported having some episodes of rectal bleed. Patient reports no subjective fevers. No reports of associated nausea or vomiting.  Patient was hemodynamically stable on the admission. He was found to have persistent or recurrent diverticulitis involving the sigmoid colon, colonic wall thickening which could be inflammation or underlying lesion, also seen was dilated and reactive coexisting appendicitis. Surgery is following. Patient was initially on ciprofloxacin and Flagyl which was then changed to [facility] 09/09/2015.   Assessment/Plan:    Principal problem: Acute right lower quadrant pain / diverticulitis of sigmoid colon without evidence of perforation without bleeding - Appreciate surgery following - CT scan with findings of diverticulitis versus appendicitis - Patient was initially on Cipro and Flagyl but currently on Invanz which was changed on 09/09/2015 - Patient is afebrile and white blood cell count is within normal limits - We will most likely repeat CT scan either today or tomorrow to follow on progress of diverticulitis versus appendicitis - Pain currently controlled with current analgesia  Active problems: Hypokalemia - Likely in the setting of diverticulitis versus appendicitis - Supplemented  Acute lower GI bleed - Likely diverticular - Hemoglobin 11.5  Uncontrolled diabetes mellitus without complications with long-term insulin use - A1c on this admission 13.9 indicating poor glycemic control - He is currently on insulin,  Lantus 10 units at bedtime along with sliding scale insulin  DVT Prophylaxis  - Lovenox subQ ordered   Code Status: Full.  Family Communication:  plan of care discussed with the patient Disposition Plan: anticipate D/ C by 3/29 if he continues to feel better with Abx and if no plan for surgery. This may change if surgery is planned.   IV access:  Peripheral IV  Procedures and diagnostic studies:    Ct Abdomen Pelvis W Contrast 09/06/2015  Persistent or recurrent diverticulitis involving the sigmoid colon. Colonic wall thickening may be due to inflammation although on underlying colon lesion is not excluded. Edema and stranding around the sigmoid colon. No discrete abscess. There is reactive inflammatory wall thickening of the adjacent terminal ileum. The appendix is also dilated and reactive or coexisting appendicitis is not excluded. Diffuse fatty infiltration of the liver.   Medical Consultants:  Surgery   Other Consultants:  None   IAnti-Infectives:   Ciprofloxacin stopped 09/09/15  Flagyl stopped 09/09/15 Invanz 09/09/2015 -->   Spencer Lenz, MD  [facility] Pager [phone]  Time spent in minutes: 25 minutes  If 7PM-7AM, please contact night-coverage [email or web address] Password [email or web address] 09/11/2015, 10:50 AM   LOS: 5 days    HPI/Subjective: No acute overnight events. Patient reports abd pain is 3/10.  Objective: Filed Vitals:   09/10/15 0432 09/10/15 1350 09/10/15 2136 09/11/15 0520  BP: 139/84 140/96 127/83 142/85  Pulse: 96 106 107 101  Temp: 97.9 F (36.6 C) 98.1 F (36.7 C) 99.7 F (37.6 C) 97.5 F (36.4 C)  TempSrc: Oral Oral Oral Oral  Resp: 18 18 18 19   Height:      Weight:      SpO2: 97% 96% 99% 95%  Intake/Output Summary (Last 24 hours) at 09/11/15 1050 Last data filed at 09/11/15 1002  Gross per 24 hour  Intake   4400 ml  Output      0 ml  Net   4400 ml    Exam:   General:  Pt is alert, follows commands appropriately, not in acute  distress  Cardiovascular: Regular rate and rhythm, S1/S2 appreciated   Respiratory: Clear to auscultation bilaterally, no wheezing, no crackles, no rhonchi  Abdomen: obese, non tender, non distended, bowel sounds present  Extremities: No edema, pulses palpable bilaterally  Neuro: Grossly nonfocal  Data Reviewed: Basic Metabolic Panel:  Recent Labs Lab 09/06/15 1816 09/06/15 2221 09/07/15 0330 09/10/15 0628 09/11/15 0506  NA 128* 128* 134* 136 138  K >7.5* 5.9* 4.9 3.0* 3.4*  CL 101 98* 102 103 104  CO2 15* 19* 23 25 24   GLUCOSE 334* 285* 249* 193* 202*  BUN 7 5* <5* <5* <5*  CREATININE 0.88 0.78 0.84 0.69 0.65  CALCIUM 8.4* 8.1* 8.4* 8.2* 8.3*  MG  --   --  1.7  --   --   PHOS  --   --  2.4*  --   --    Liver Function Tests:  Recent Labs Lab 09/06/15 1555 09/06/15 2221 09/07/15 0330  AST 98* 75* 59*  ALT 52 18 36  ALKPHOS 95 76 78  BILITOT 4.2* 3.2* 2.1*  PROT 7.2 >12.0* 6.1*  ALBUMIN 3.8 3.1* 3.2*    Recent Labs Lab 09/06/15 1555  LIPASE 18   No results for input(s): AMMONIA in the last 168 hours. CBC:  Recent Labs Lab 09/06/15 1555 09/07/15 0330 09/08/15 0753 09/10/15 0628  WBC 14.6* 10.9* 11.2* 10.4  HGB 15.4 13.6 12.8* 11.5*  HCT 42.5 38.6* 38.2* 35.5*  MCV 82.4 82.8 82.7 83.1  PLT 179 141* 125* 177   Cardiac Enzymes: No results for input(s): CKTOTAL, CKMB, CKMBINDEX, TROPONINI in the last 168 hours. BNP: Invalid input(s): POCBNP CBG:  Recent Labs Lab 09/10/15 0733 09/10/15 1227 09/10/15 1714 09/10/15 2134 09/11/15 0749  GLUCAP 166* 162* 168* 199* 172*    No results found for this or any previous visit (from the past 240 hour(s)).   Scheduled Meds: . enoxaparin (LOVENOX) injection  80 mg Subcutaneous Q24H  . ertapenem  1 g Intravenous Q24H  . insulin aspart  0-9 Units Subcutaneous 6 times per day  . insulin aspart  10 Units Intravenous Once  . insulin glargine  10 Units Subcutaneous QHS  . potassium chloride  10 mEq  Intravenous Q1 Hr x 3  . saccharomyces boulardii  250 mg Oral BID  . sodium chloride flush  3 mL Intravenous Q12H   Continuous Infusions: . sodium chloride 125 mL/hr at 09/10/15 2135  . sodium chloride 0.9 % 1,000 mL with potassium chloride 30 mEq infusion 75 mL/hr at 09/10/15 [phone]

## 2015-09-11 NOTE — Progress Notes (Signed)
  Subjective: He says he is much better than on admit and yesterday, but still having pain right side.  Still taking pain meds.  Reports having "wet flauts," not really a BM.  Tolerating liquids well.  Objective: Vital signs in last 24 hours: Temp:  [97.5 F (36.4 C)-99.7 F (37.6 C)] 97.5 F (36.4 C) (03/27 0520) Pulse Rate:  [101-107] 101 (03/27 0520) Resp:  [18-19] 19 (03/27 0520) BP: (127-142)/(83-96) 142/85 mmHg (03/27 0520) SpO2:  [95 %-99 %] 95 % (03/27 0520) Last BM Date: 09/10/15 PO 1520  Diet:  D1 Voided x 8 BM x 5 Afebrile, some mild tachycardia/BP elevation K+ 3.4, Glucose 202 No CBC this AM CT scan 09/06/15 Intake/Output from previous day: 03/26 0701 - 03/27 0700 In: 4520 [P.O.:1520; I.V.:3000] Out: -  Intake/Output this shift:    General appearance: alert, appears stated age and no distress GI: soft, tender right side, BS hypoactive  Lab Results:   Recent Labs  09/08/15 0753 09/10/15 0628  WBC 11.2* 10.4  HGB 12.8* 11.5*  HCT 38.2* 35.5*  PLT 125* 177    BMET  Recent Labs  09/10/15 0628 09/11/15 0506  NA 136 138  K 3.0* 3.4*  CL 103 104  CO2 25 24  GLUCOSE 193* 202*  BUN <5* <5*  CREATININE 0.69 0.65  CALCIUM 8.2* 8.3*   PT/INR No results for input(s): LABPROT, INR in the last 72 hours.   Recent Labs Lab 09/06/15 1555 09/06/15 2221 09/07/15 0330  AST 98* 75* 59*  ALT 52 18 36  ALKPHOS 95 76 78  BILITOT 4.2* 3.2* 2.1*  PROT 7.2 >12.0* 6.1*  ALBUMIN 3.8 3.1* 3.2*     Lipase     Component Value Date/Time   LIPASE 18 09/06/2015 1555     Studies/Results: No results found.  Medications: . enoxaparin (LOVENOX) injection  80 mg Subcutaneous Q24H  . ertapenem  1 g Intravenous Q24H  . insulin aspart  0-9 Units Subcutaneous 6 times per day  . insulin aspart  10 Units Intravenous Once  . insulin glargine  10 Units Subcutaneous QHS  . sodium chloride flush  3 mL Intravenous Q12H   . sodium chloride 125 mL/hr at 09/10/15  2135  . sodium chloride 0.9 % 1,000 mL with potassium chloride 30 mEq infusion 75 mL/hr at 09/10/15 0858   Prior to Admission medications   Medication Sig Start Date End Date Taking? Authorizing Provider  Multiple Vitamin (MULTIVITAMIN) tablet Take 1 tablet by mouth daily. Vegan Multi-Pak   Yes Historical Provider, MD  oxyCODONE (OXY IR/ROXICODONE) 5 MG immediate release tablet Take 1-2 tablets (5-10 mg total) by mouth every 4 (four) hours as needed for moderate pain. 02/02/15  Yes Rolm Bookbinder, MD  glucose blood (ONE TOUCH ULTRA TEST) test strip Check blood sugars no more than twice daily. Patient not taking: Reported on 09/06/2015 11/15/14   Colon Branch, MD     Assessment/Plan Diverticulitis vs appendicitis (we believe diverticulitis) AODM Hx of OSA ADHD Gout Body mass index is 47 Hypokalemia/Hyponatremia Antibiotics: Lovenox/SCD DVT: Day 6 antibiotics; day 3 Invanz   Plan:  NO WBC this AM, he says he is improving, I will discuss with Dr. Hulen Skains, recheck labs in AM, today is day 3 of Invanz.  Discuss when to repeat CT.    LOS: 5 days    Spencer Good 09/11/2015 [phone]

## 2015-09-11 NOTE — Progress Notes (Signed)
Inpatient Diabetes Program Recommendations  AACE/ADA: New Consensus Statement on Inpatient Glycemic Control (2015)  Target Ranges:  Prepandial:   less than 140 mg/dL      Peak postprandial:   less than 180 mg/dL (1-2 hours)      Critically ill patients:  140 - 180 mg/dL    Inpatient Diabetes Program Recommendations:  Spoke with patient briefly concerning possibility of starting on basal insulin @ home. Pt. States he plans to discuss with his primary physician. Plan to follow.   [name] [name], RN, MSN, CDE Inpatient Glycemic Control Team Team Pager [phone] (8am-5pm) 09/11/2015 3:26 PM

## 2015-09-12 ENCOUNTER — Inpatient Hospital Stay (HOSPITAL_COMMUNITY): Payer: BLUE CROSS/BLUE SHIELD

## 2015-09-12 ENCOUNTER — Encounter (HOSPITAL_COMMUNITY): Payer: Self-pay | Admitting: *Deleted

## 2015-09-12 DIAGNOSIS — K922 Gastrointestinal hemorrhage, unspecified: Secondary | ICD-10-CM

## 2015-09-12 LAB — GLUCOSE, CAPILLARY
Glucose-Capillary: 184 mg/dL — ABNORMAL HIGH (ref 65–99)
Glucose-Capillary: 170 mg/dL — ABNORMAL HIGH (ref 65–99)
Glucose-Capillary: 211 mg/dL — ABNORMAL HIGH (ref 65–99)
Glucose-Capillary: 194 mg/dL — ABNORMAL HIGH (ref 65–99)

## 2015-09-12 LAB — CBC
HCT: 36.5 % — ABNORMAL LOW (ref 39.0–52.0)
Hemoglobin: 11.6 g/dL — ABNORMAL LOW (ref 13.0–17.0)
MCH: 26.7 pg (ref 26.0–34.0)
MCHC: 31.8 g/dL (ref 30.0–36.0)
MCV: 83.9 fL (ref 78.0–100.0)
Platelets: 206 10*3/uL (ref 150–400)
RBC: 4.35 MIL/uL (ref 4.22–5.81)
RDW: 15.8 % — ABNORMAL HIGH (ref 11.5–15.5)
WBC: 10.2 10*3/uL (ref 4.0–10.5)

## 2015-09-12 LAB — BASIC METABOLIC PANEL
Anion gap: 8 (ref 5–15)
BUN: <5 mg/dL — ABNORMAL LOW (ref 6–20)
CO2: 28 mmol/L (ref 22–32)
Calcium: 8.4 mg/dL — ABNORMAL LOW (ref 8.9–10.3)
Chloride: 106 mmol/L (ref 101–111)
Creatinine, Ser: 0.69 mg/dL (ref 0.61–1.24)
GFR calc Af Amer: >60 mL/min (ref >60)
GFR calc non Af Amer: >60 mL/min (ref >60)
Glucose, Bld: 179 mg/dL — ABNORMAL HIGH (ref 65–99)
Potassium: 3.8 mmol/L (ref 3.5–5.1)
Sodium: 142 mmol/L (ref 135–145)

## 2015-09-12 MED ORDER — IOHEXOL 300 MG/ML  SOLN
25.0000 mL | INTRAMUSCULAR | Status: AC
  Administered 2015-09-12 (×2): 25 mL via ORAL

## 2015-09-12 MED ORDER — IOPAMIDOL (ISOVUE-300) INJECTION 61%
INTRAVENOUS | Status: AC
  Administered 2015-09-12: 100 mL
  Filled 2015-09-12: qty 100

## 2015-09-12 NOTE — Progress Notes (Signed)
Inpatient Diabetes Program Recommendations  AACE/ADA: New Consensus Statement on Inpatient Glycemic Control (2015)  Target Ranges:  Prepandial:   less than 140 mg/dL      Peak postprandial:   less than 180 mg/dL (1-2 hours)      Critically ill patients:  140 - 180 mg/dL   Review of Glycemic Control Results for [place], Harding R (MRN [id number]) as of 09/12/2015 13:32  Ref. Range 09/11/2015 12:02 09/11/2015 17:00 09/11/2015 21:36 09/12/2015 07:49 09/12/2015 11:25  Glucose-Capillary Latest Ref Range: 65-99 mg/dL 170 (H) 185 (H) 217 (H) 184 (H) 170 (H)   Inpatient Diabetes Program Recommendations:  Spoke with patient concerning possibility of discharge on insulin. Pt. States he wants to follow up with his PCP and not interested in starter kit or further information at this time.  Thank you, Judy E. Hanks, RN, MSN, CDE Inpatient Glycemic Control Team Team Pager #[phone] (8am-5pm) 09/12/2015 1:34 PM      

## 2015-09-12 NOTE — Progress Notes (Signed)
Patient ID: Spencer Good, male   DOB: Mar 23, 1976, 40 y.o.   MRN: [id number] [facility] PROGRESS NOTE  RANNON ECONOMOS [id number] DOB: 04/29/1976 DOA: 09/06/2015 PCP: Kathlene November, MD  Brief narrative:    40 year old male with past medical history of diverticulitis, DM who presented to [facility] ED with right lower quadrant abdominal pain started abruptly one day prior to the admission with no specific aggravating or alleviating factors. He also reported having some episodes of rectal bleed. Patient reports no subjective fevers. No reports of associated nausea or vomiting.  Patient was hemodynamically stable on the admission. He was found to have persistent or recurrent diverticulitis involving the sigmoid colon, colonic wall thickening which could be inflammation or underlying lesion, also seen was dilated and reactive coexisting appendicitis. Surgery is following. Patient was initially on ciprofloxacin and Flagyl which was then changed to [facility] 09/09/2015.  Assessment/Plan:    Principal problem: Acute right lower quadrant pain / diverticulitis of sigmoid colon without evidence of perforation without bleeding - Appreciate surgery following - CT scan with findings of diverticulitis versus appendicitis - Patient was initially on Cipro and Flagyl but currently on Invanz which was changed on 09/09/2015 - Patient is afebrile and white blood cell count is within normal limits - Repeat CT scan today. If it's improving then most likely we can change to by mouth antibiotics   Active problems: Hypokalemia - Likely in the setting of diverticulitis versus appendicitis - Supplemented within normal limits  Acute lower GI bleed - Likely diverticular - Hemoglobin 11.6, stable  Uncontrolled diabetes mellitus without complications with long-term insulin use - A1c on this admission 13.9 indicating poor glycemic control - Continue insulin, Lantus 10 units at bedtime along with sliding scale insulin -  Appreciate diabetic coordinator recommendations  DVT Prophylaxis  - Lovenox subQ   Code Status: Full.  Family Communication:  plan of care discussed with the patient Disposition Plan: depend on CT scan results, if better d/c by 3/29  IV access:  Peripheral IV  Procedures and diagnostic studies:    Ct Abdomen Pelvis W Contrast 09/06/2015  Persistent or recurrent diverticulitis involving the sigmoid colon. Colonic wall thickening may be due to inflammation although on underlying colon lesion is not excluded. Edema and stranding around the sigmoid colon. No discrete abscess. There is reactive inflammatory wall thickening of the adjacent terminal ileum. The appendix is also dilated and reactive or coexisting appendicitis is not excluded. Diffuse fatty infiltration of the liver.   Medical Consultants:  Surgery   Other Consultants:  None   IAnti-Infectives:   Ciprofloxacin stopped 09/09/15  Flagyl stopped 09/09/15 Invanz 09/09/2015 -->   Leisa Lenz, MD  [facility] Pager [phone]  Time spent in minutes: 15 minutes  If 7PM-7AM, please contact night-coverage [email or web address] Password [facility] 09/12/2015, 12:54 PM   LOS: 6 days    HPI/Subjective: No acute overnight events. Patient reports abd pain controlled.   Objective: Filed Vitals:   09/11/15 0520 09/11/15 1416 09/11/15 2137 09/12/15 0526  BP: 142/85 131/82 147/83 150/97  Pulse: 101 102 96 99  Temp: 97.5 F (36.4 C) 98.1 F (36.7 C) 98.6 F (37 C) 97.8 F (36.6 C)  TempSrc: Oral Oral Oral Oral  Resp: 19 18 17 18   Height:      Weight:      SpO2: 95% 96% 97% 96%    Intake/Output Summary (Last 24 hours) at 09/12/15 1254 Last data filed at 09/12/15 0900  Gross per 24 hour  Intake  2566 ml  Output   2000 ml  Net    566 ml    Exam:   General:  Pt is alert, not in acute distress  Cardiovascular: Regular rate and rhythm, S1/S2 (+)  Respiratory: no wheezing, no crackles, no rhonchi  Abdomen: obese, non  tender, non distended, bowel sounds (+)  Extremities: No edema, pulses palpable   Neuro: Nonfocal  Data Reviewed: Basic Metabolic Panel:  Recent Labs Lab 09/06/15 2221 09/07/15 0330 09/10/15 0628 09/11/15 0506 09/12/15 0435  NA 128* 134* 136 138 142  K 5.9* 4.9 3.0* 3.4* 3.8  CL 98* 102 103 104 106  CO2 19* 23 25 24 28   GLUCOSE 285* 249* 193* 202* 179*  BUN 5* <5* <5* <5* <5*  CREATININE 0.78 0.84 0.69 0.65 0.69  CALCIUM 8.1* 8.4* 8.2* 8.3* 8.4*  MG  --  1.7  --   --   --   PHOS  --  2.4*  --   --   --    Liver Function Tests:  Recent Labs Lab 09/06/15 1555 09/06/15 2221 09/07/15 0330  AST 98* 75* 59*  ALT 52 18 36  ALKPHOS 95 76 78  BILITOT 4.2* 3.2* 2.1*  PROT 7.2 >12.0* 6.1*  ALBUMIN 3.8 3.1* 3.2*    Recent Labs Lab 09/06/15 1555  LIPASE 18   No results for input(s): AMMONIA in the last 168 hours. CBC:  Recent Labs Lab 09/06/15 1555 09/07/15 0330 09/08/15 0753 09/10/15 0628 09/12/15 0435  WBC 14.6* 10.9* 11.2* 10.4 10.2  HGB 15.4 13.6 12.8* 11.5* 11.6*  HCT 42.5 38.6* 38.2* 35.5* 36.5*  MCV 82.4 82.8 82.7 83.1 83.9  PLT 179 141* 125* 177 206   Cardiac Enzymes: No results for input(s): CKTOTAL, CKMB, CKMBINDEX, TROPONINI in the last 168 hours. BNP: Invalid input(s): POCBNP CBG:  Recent Labs Lab 09/11/15 1202 09/11/15 1700 09/11/15 2136 09/12/15 0749 09/12/15 1125  GLUCAP 170* 185* 217* 184* 170*    No results found for this or any previous visit (from the past 240 hour(s)).   Scheduled Meds: . enoxaparin (LOVENOX) injection  80 mg Subcutaneous Q24H  . ertapenem  1 g Intravenous Q24H  . insulin aspart  0-9 Units Subcutaneous TID WC  . insulin aspart  10 Units Intravenous Once  . insulin glargine  10 Units Subcutaneous QHS  . saccharomyces boulardii  250 mg Oral BID  . sodium chloride flush  3 mL Intravenous Q12H   Continuous Infusions: . sodium chloride 125 mL/hr at 09/10/15 2135  . sodium chloride 0.9 % 1,000 mL with  potassium chloride 30 mEq infusion 75 mL/hr at 09/12/15 0259

## 2015-09-12 NOTE — Progress Notes (Signed)
  Subjective: Still tender on the right, but he reports daily improvement.  Tolerating soft diet and had a BM.    Objective: Vital signs in last 24 hours: Temp:  [97.8 F (36.6 C)-98.6 F (37 C)] 97.8 F (36.6 C) (03/28 0526) Pulse Rate:  [96-102] 99 (03/28 0526) Resp:  [17-18] 18 (03/28 0526) BP: (131-150)/(82-97) 150/97 mmHg (03/28 0526) SpO2:  [96 %-97 %] 96 % (03/28 0526) Last BM Date: 09/11/15 1980 PO  Soft diet 1550 urine No Bm recorded Afebrile, VSS Labs OK, WBC remains normal Last CT 09/06/15 Intake/Output from previous day: 03/27 0701 - 03/28 0700 In: 2206 [P.O.:1980; I.V.:226] Out: 1550 [Urine:1550] Intake/Output this shift:    General appearance: alert, cooperative and no distress GI: soft still tender to palpation on the right.  + BS, +BM  Lab Results:   Recent Labs  09/10/15 0628 09/12/15 0435  WBC 10.4 10.2  HGB 11.5* 11.6*  HCT 35.5* 36.5*  PLT 177 206    BMET  Recent Labs  09/11/15 0506 09/12/15 0435  NA 138 142  K 3.4* 3.8  CL 104 106  CO2 24 28  GLUCOSE 202* 179*  BUN <5* <5*  CREATININE 0.65 0.69  CALCIUM 8.3* 8.4*   PT/INR No results for input(s): LABPROT, INR in the last 72 hours.   Recent Labs Lab 09/06/15 1555 09/06/15 2221 09/07/15 0330  AST 98* 75* 59*  ALT 52 18 36  ALKPHOS 95 76 78  BILITOT 4.2* 3.2* 2.1*  PROT 7.2 >12.0* 6.1*  ALBUMIN 3.8 3.1* 3.2*     Lipase     Component Value Date/Time   LIPASE 18 09/06/2015 1555     Studies/Results: No results found.  Medications: . enoxaparin (LOVENOX) injection  80 mg Subcutaneous Q24H  . ertapenem  1 g Intravenous Q24H  . insulin aspart  0-9 Units Subcutaneous TID WC  . insulin aspart  10 Units Intravenous Once  . insulin glargine  10 Units Subcutaneous QHS  . saccharomyces boulardii  250 mg Oral BID  . sodium chloride flush  3 mL Intravenous Q12H    Assessment/Plan Diverticulitis vs appendicitis (we believe diverticulitis) AODM Hx of  OSA ADHD Gout Body mass index is 47 Hypokalemia/Hyponatremia Antibiotics: Lovenox/SCD DVT: Day 7 antibiotics; day 4 Invanz    Plan:  Discuss repeat CT, and switching to oral antibiotics.   LOS: 6 days    Zenya Hickam 09/12/2015 [phone]

## 2015-09-13 ENCOUNTER — Inpatient Hospital Stay (HOSPITAL_COMMUNITY): Payer: BLUE CROSS/BLUE SHIELD

## 2015-09-13 DIAGNOSIS — R1031 Right lower quadrant pain: Secondary | ICD-10-CM | POA: Diagnosis present

## 2015-09-13 DIAGNOSIS — K922 Gastrointestinal hemorrhage, unspecified: Secondary | ICD-10-CM | POA: Diagnosis present

## 2015-09-13 DIAGNOSIS — K5720 Diverticulitis of large intestine with perforation and abscess without bleeding: Secondary | ICD-10-CM

## 2015-09-13 DIAGNOSIS — E876 Hypokalemia: Secondary | ICD-10-CM | POA: Diagnosis present

## 2015-09-13 LAB — GLUCOSE, CAPILLARY
Glucose-Capillary: 168 mg/dL — ABNORMAL HIGH (ref 65–99)
Glucose-Capillary: 126 mg/dL — ABNORMAL HIGH (ref 65–99)
Glucose-Capillary: 120 mg/dL — ABNORMAL HIGH (ref 65–99)
Glucose-Capillary: 167 mg/dL — ABNORMAL HIGH (ref 65–99)

## 2015-09-13 MED ORDER — FENTANYL CITRATE (PF) 100 MCG/2ML IJ SOLN
INTRAMUSCULAR | Status: AC | PRN
  Administered 2015-09-13 (×2): 100 ug via INTRAVENOUS

## 2015-09-13 MED ORDER — ENOXAPARIN SODIUM 80 MG/0.8ML ~~LOC~~ SOLN
80.0000 mg | SUBCUTANEOUS | Status: DC
  Administered 2015-09-13 – 2015-09-14 (×2): 80 mg via SUBCUTANEOUS
  Filled 2015-09-13 (×2): qty 0.8

## 2015-09-13 MED ORDER — MIDAZOLAM HCL 2 MG/2ML IJ SOLN
INTRAMUSCULAR | Status: AC | PRN
  Administered 2015-09-13: 2 mg via INTRAVENOUS

## 2015-09-13 MED ORDER — FENTANYL CITRATE (PF) 100 MCG/2ML IJ SOLN
INTRAMUSCULAR | Status: AC
  Filled 2015-09-13: qty 2

## 2015-09-13 MED ORDER — LIDOCAINE HCL (PF) 1 % IJ SOLN
INTRAMUSCULAR | Status: AC
Start: 2015-09-13 — End: 2015-09-14
  Filled 2015-09-13: qty 30

## 2015-09-13 MED ORDER — MIDAZOLAM HCL 2 MG/2ML IJ SOLN
INTRAMUSCULAR | Status: AC
Start: 2015-09-13 — End: 2015-09-14
  Filled 2015-09-13: qty 2

## 2015-09-13 NOTE — Procedures (Signed)
Successful CT GUIDED RLQ ABSCESS DR INSERTION No comp Stable Pus aspirated Gs/cx sent Full report in PACS

## 2015-09-13 NOTE — Sedation Documentation (Signed)
Patient is resting comfortably. 

## 2015-09-13 NOTE — Sedation Documentation (Signed)
Abd. Drain placed. Tender, painful for pt. Additional meds given.

## 2015-09-13 NOTE — Progress Notes (Signed)
Patient ID: Spencer Good, male   DOB: 20-Nov-1975, 40 y.o.   MRN: [id number] [facility] PROGRESS NOTE  KENER CABAL [id number] DOB: 1976/06/11 DOA: 09/06/2015 PCP: Kathlene November, MD  Brief narrative:    39 year old male with past medical history of diverticulitis, DM who presented to [facility] ED with right lower quadrant abdominal pain started abruptly one day prior to the admission with no specific aggravating or alleviating factors. He also reported having some episodes of rectal bleed. Patient reports no subjective fevers. No reports of associated nausea or vomiting.  Patient was hemodynamically stable on the admission. He was found to have persistent or recurrent diverticulitis involving the sigmoid colon, colonic wall thickening which could be inflammation or underlying lesion, also seen was dilated and reactive coexisting appendicitis. Surgery is following. Patient was initially on ciprofloxacin and Flagyl which was then changed to [facility] 09/09/2015.  Repeat CT scan 09/12/2015 shows increasing inflammatory changes in the right mid abdomen and right pelvis secondary to sigmoid diverticulitis with abscess formation. Patient will need drainage placement by interventional radiology today.  Assessment/Plan:    Principal problem: Acute right lower quadrant pain / diverticulitis of sigmoid colon with abscess without bleeding - Appreciate surgery following - Repeat CT scan 09/12/2015 with increasing inflammatory changes in the right mid abdomen and right pelvis secondary to sigmoid diverticulitis with abscess formation - Plan for percutaneous drainage placement today by IR - Continue Invanz for now. He was on Cipro and Flagyl through 09/09/2015 which was then changed to [place] - He remains afebrile and white blood cell count is within normal limits  Active problems: Hypokalemia - Secondary to GI related issues, diverticulitis - Potassium was supplemented and it is within normal  limits  Acute lower GI bleed - Likely diverticular - Hemoglobin remains stable - No indications for transfusion at this time  Uncontrolled diabetes mellitus without complications with long-term insulin use - A1c on this admission 13.9 indicating poor glycemic control - Continue insulin, Lantus 10 units at bedtime along with sliding scale insulin - CBGs in past 24 hours: 170, 211, 194 - Appreciate diabetic coordinator input   DVT Prophylaxis  - Lovenox subQ; we'll hold that prior to procedure today  Code Status: Full.  Family Communication:  plan of care discussed with the patient Disposition Plan: Needs drainage of the diverticular abscess today. Anticipated discharge by tomorrow if cleared by surgery with a drain in place.  IV access:  Peripheral IV  Procedures and diagnostic studies:    Ct Abdomen Pelvis W Contrast 09/06/2015  Persistent or recurrent diverticulitis involving the sigmoid colon. Colonic wall thickening may be due to inflammation although on underlying colon lesion is not excluded. Edema and stranding around the sigmoid colon. No discrete abscess. There is reactive inflammatory wall thickening of the adjacent terminal ileum. The appendix is also dilated and reactive or coexisting appendicitis is not excluded. Diffuse fatty infiltration of the liver.   Ct Abdomen Pelvis W Contrast 09/12/2015   Increased inflammatory changes in the RIGHT mid abdomen and RIGHT pelvis secondary to sigmoid diverticulitis other mildly redundant sigmoid colon. Two developing fluid collections are seen in the RIGHT mid abdomen concerning for developing abscesses as above.   Medical Consultants:  Surgery   Other Consultants:  None   IAnti-Infectives:   Ciprofloxacin stopped 09/09/15  Flagyl stopped 09/09/15 Invanz 09/09/2015 -->   Leisa Lenz, MD  [facility] Pager [phone]  Time spent in minutes: 77minutes  If 7PM-7AM, please contact night-coverage [email or web address] Password  [email or web address] 09/13/2015, 11:09 AM   LOS: 7 days    HPI/Subjective: No acute overnight events. Patient feels okay this am.  Objective: Filed Vitals:   09/12/15 0526 09/12/15 1453 09/12/15 2208 09/13/15 0547  BP: 150/97 142/88 153/80 148/86  Pulse: 99 99 98 93  Temp: 97.8 F (36.6 C) 98.3 F (36.8 C) 98.6 F (37 C) 98.1 F (36.7 C)  TempSrc: Oral Oral Oral   Resp: 18 19 18 18   Height:      Weight:      SpO2: 96% 96% 98% 95%    Intake/Output Summary (Last 24 hours) at 09/13/15 1109 Last data filed at 09/13/15 0600  Gross per 24 hour  Intake   2430 ml  Output    900 ml  Net   1530 ml    Exam:   General:  Pt is not in acute distress  Cardiovascular: Rate controlled, appreciate S1, S2   Respiratory: bilateral air entry, no wheezing   Abdomen: obese, non tender, (+) BS  Extremities: No swelling, palpable pulses   Neuro: No focal deficits   Data Reviewed: Basic Metabolic Panel:  Recent Labs Lab 09/06/15 2221 09/07/15 0330 09/10/15 0628 09/11/15 0506 09/12/15 0435  NA 128* 134* 136 138 142  K 5.9* 4.9 3.0* 3.4* 3.8  CL 98* 102 103 104 106  CO2 19* 23 25 24 28   GLUCOSE 285* 249* 193* 202* 179*  BUN 5* <5* <5* <5* <5*  CREATININE 0.78 0.84 0.69 0.65 0.69  CALCIUM 8.1* 8.4* 8.2* 8.3* 8.4*  MG  --  1.7  --   --   --   PHOS  --  2.4*  --   --   --    Liver Function Tests:  Recent Labs Lab 09/06/15 1555 09/06/15 2221 09/07/15 0330  AST 98* 75* 59*  ALT 52 18 36  ALKPHOS 95 76 78  BILITOT 4.2* 3.2* 2.1*  PROT 7.2 >12.0* 6.1*  ALBUMIN 3.8 3.1* 3.2*    Recent Labs Lab 09/06/15 1555  LIPASE 18   No results for input(s): AMMONIA in the last 168 hours. CBC:  Recent Labs Lab 09/06/15 1555 09/07/15 0330 09/08/15 0753 09/10/15 0628 09/12/15 0435  WBC 14.6* 10.9* 11.2* 10.4 10.2  HGB 15.4 13.6 12.8* 11.5* 11.6*  HCT 42.5 38.6* 38.2* 35.5* 36.5*  MCV 82.4 82.8 82.7 83.1 83.9  PLT 179 141* 125* 177 206   Cardiac Enzymes: No results for input(s):  CKTOTAL, CKMB, CKMBINDEX, TROPONINI in the last 168 hours. BNP: Invalid input(s): POCBNP CBG:  Recent Labs Lab 09/11/15 2136 09/12/15 0749 09/12/15 1125 09/12/15 1850 09/12/15 2154  GLUCAP 217* 184* 170* 211* 194*    No results found for this or any previous visit (from the past 240 hour(s)).   Scheduled Meds: . enoxaparin (LOVENOX) injection  80 mg Subcutaneous Q24H  . ertapenem  1 g Intravenous Q24H  . insulin aspart  0-9 Units Subcutaneous TID WC  . insulin aspart  10 Units Intravenous Once  . insulin glargine  10 Units Subcutaneous QHS  . saccharomyces boulardii  250 mg Oral BID  . sodium chloride flush  3 mL Intravenous Q12H   Continuous Infusions: . sodium chloride 100 mL/hr at 09/13/15 0801  . sodium chloride 0.9 % 1,000 mL with potassium chloride 30 mEq infusion 75 mL/hr at 09/13/15 1042

## 2015-09-13 NOTE — Sedation Documentation (Signed)
Patient denies pain and is resting comfortably.  

## 2015-09-13 NOTE — Progress Notes (Signed)
Subjective: Still tender on the right side.  No increase in pain but still like it has been last couple days.  Objective: Vital signs in last 24 hours: Temp:  [98.1 F (36.7 C)-98.6 F (37 C)] 98.1 F (36.7 C) (03/29 0547) Pulse Rate:  [93-99] 93 (03/29 0547) Resp:  [18-19] 18 (03/29 0547) BP: (142-153)/(80-88) 148/86 mmHg (03/29 0547) SpO2:  [95 %-98 %] 95 % (03/29 0547) Last BM Date: 09/11/15 PO 1080, I have him NPO now for possible IR intervention BM x 1, good urine output Afebrile, VSS No labs CT scan yesterday:  Inflammatory changes extend into the RIGHT mid abdomen adjacent to multiple small bowel loops and laterally as far as the cecum. Two new fluid collections are seen in the RIGHT lower quadrant concerning for developing abscess collections, measuring 5.1 x 3.5 x 4.2 cm more laterally and posteriorly and 3.8 x 2.3 x 2.8 cm more anteromedially.  Intake/Output from previous day: 03/28 0701 - 03/29 0700 In: 3030 [P.O.:1080; I.V.:1800; IV Piggyback:150] Out: 1350 [Urine:1350] Intake/Output this shift:    General appearance: alert, cooperative and no distress GI: soft, still tender Right side RLQ.  Lab Results:   Recent Labs  09/12/15 0435  WBC 10.2  HGB 11.6*  HCT 36.5*  PLT 206    BMET  Recent Labs  09/11/15 0506 09/12/15 0435  NA 138 142  K 3.4* 3.8  CL 104 106  CO2 24 28  GLUCOSE 202* 179*  BUN <5* <5*  CREATININE 0.65 0.69  CALCIUM 8.3* 8.4*   PT/INR No results for input(s): LABPROT, INR in the last 72 hours.   Recent Labs Lab 09/06/15 1555 09/06/15 2221 09/07/15 0330  AST 98* 75* 59*  ALT 52 18 36  ALKPHOS 95 76 78  BILITOT 4.2* 3.2* 2.1*  PROT 7.2 >12.0* 6.1*  ALBUMIN 3.8 3.1* 3.2*     Lipase     Component Value Date/Time   LIPASE 18 09/06/2015 1555     Studies/Results: Ct Abdomen Pelvis W Contrast  09/12/2015  CLINICAL DATA:  Diverticulitis, followup EXAM: CT ABDOMEN AND PELVIS WITH CONTRAST TECHNIQUE: Multidetector CT  imaging of the abdomen and pelvis was performed using the standard protocol following bolus administration of intravenous contrast. Sagittal and coronal MPR images reconstructed from axial data set. CONTRAST:  ISOVUE-300 IOPAMIDOL (ISOVUE-300) INJECTION 61% IV. Dilute oral contrast. COMPARISON:  09/06/2015 FINDINGS: Dependent atelectasis RIGHT lower lobe. Diffuse fatty infiltration of liver. Contracted gallbladder. Liver, spleen, pancreas, kidneys, and adrenal glands otherwise normal. Splenules adjacent to spleen. Marked wall thickening of the mid sigmoid colon, which swings RIGHT of midline in the RIGHT pelvis with note of colonic diverticula and increased surrounding pericolic inflammatory changes compatible with persistent sigmoid diverticulitis. Inflammatory changes extend into the RIGHT mid abdomen adjacent to multiple small bowel loops and laterally as far as the cecum. Two new fluid collections are seen in the RIGHT lower quadrant concerning for developing abscess collections, measuring 5.1 x 3.5 x 4.2 cm more laterally and posteriorly and 3.8 x 2.3 x 2.8 cm more anteromedially. The larger of these collections appears to abut the medial wall of the cecum. Thickening of retroperitoneal fascia planes in the mid abdomen and pelvis. No definite free intraperitoneal air or fluid. Probable thickened appendix again noted. Stomach and remaining bowel loops grossly unremarkable without definite evidence of dilatation/obstruction. LEFT inguinal hernia containing fat. Unremarkable bladder and ureters for technique. No mass, adenopathy, or acute osseous findings. IMPRESSION: Increased inflammatory changes in the RIGHT mid abdomen and  RIGHT pelvis secondary to sigmoid diverticulitis other mildly redundant sigmoid colon. Two developing fluid collections are seen in the RIGHT mid abdomen concerning for developing abscesses as above. Electronically Signed   By: Lavonia Dana M.D.   On: 09/12/2015 17:15    Medications: .  enoxaparin (LOVENOX) injection  80 mg Subcutaneous Q24H  . ertapenem  1 g Intravenous Q24H  . insulin aspart  0-9 Units Subcutaneous TID WC  . insulin aspart  10 Units Intravenous Once  . insulin glargine  10 Units Subcutaneous QHS  . saccharomyces boulardii  250 mg Oral BID  . sodium chloride flush  3 mL Intravenous Q12H    Assessment/Plan Diverticulitis vs appendicitis (we believe diverticulitis) AODM Hx of OSA ADHD Gout Body mass index is 47 Hypokalemia/Hyponatremia Antibiotics: Lovenox/SCD DVT: Day 7 antibiotics; day 5 Invanz   Plan:  I have made him NPO, will hold lovenox till we see if IR thinks he a candidate for a drain.  Recheck labs in AM.   Back from IR, thick white colored purulent fluid coming from the drain. JP is just about full right now. I will put him back on a soft diet. Continue antibiotics.    LOS: 7 days    Caria Transue 09/13/2015 [phone]

## 2015-09-13 NOTE — H&P (Signed)
Chief Complaint: Abdominal fluid collection  Referring Physician(s): Judeth Horn  Supervising Physician: Daryll Brod  History of Present Illness: Spencer Good is a 40 y.o. male who presented on 09/06/2015 with right flank pain for the past 2 days. He stated the pain has been having waxing and waning pain. This is similar to his prior symptoms of the diverticulitis but worse.  CT scan done on the 22nd did not show abscess at the time.  He has been on antibiotics and clear liquid diet.  CT scan done yesterday now shows a right abdominal fluid collection.  We are asked to evaluate patient for CT guided drainage/drain placement to treat the fluid collection.  He is NPO and is not taking blood thinners. Last dose of Lovenox was yesterday at 10 am  Past Medical History  Diagnosis Date  . Gout     doesn't take any meds   . ADHD (attention deficit hyperactivity disorder)     takes Vyvanse daily  . OSA (obstructive sleep apnea) dx 05-2013    Rx a Cpap 3-15, can't use   . Diverticulitis     takes Electronics engineer daily  . Hernia, umbilical [id number]    Laparoscopic umbilical hernia repair with mesh  . Insomnia     takes Sonata nightly as needed  . History of colon polyps     precancerous  . Diabetes ([place])     but doesn't take any meds  . Obesity     Past Surgical History  Procedure Laterality Date  . Colonoscopy    . Lasik Bilateral   . Umbilical hernia repair N/A 02/01/2015    Procedure: LAPAROSCOPIC UMBILICAL HERNIA REPAIR WITH MESH;  Surgeon: Rolm Bookbinder, MD;  Location: [place];  Service: General;  Laterality: N/A;  . Insertion of mesh N/A 02/01/2015    Procedure: INSERTION OF MESH;  Surgeon: Rolm Bookbinder, MD;  Location: [place];  Service: General;  Laterality: N/A;  . Hernia repair      Allergies: Review of patient's allergies indicates no known allergies.  Medications: Prior to Admission medications   Medication Sig Start Date End Date Taking? Authorizing Provider   Multiple Vitamin (MULTIVITAMIN) tablet Take 1 tablet by mouth daily. Vegan Multi-Pak   Yes Historical Provider, MD  oxyCODONE (OXY IR/ROXICODONE) 5 MG immediate release tablet Take 1-2 tablets (5-10 mg total) by mouth every 4 (four) hours as needed for moderate pain. 02/02/15  Yes Rolm Bookbinder, MD  glucose blood (ONE TOUCH ULTRA TEST) test strip Check blood sugars no more than twice daily. Patient not taking: Reported on 09/06/2015 11/15/14   Colon Branch, MD     Family History  Problem Relation Age of Onset  . Colon cancer Neg Hx   . Prostate cancer Neg Hx   . Diabetes Neg Hx     distant fam members  . CAD Other     MGF  . Stroke Father     F, PGF  . Heart disease Maternal Grandfather   . Colon polyps Neg Hx   . Esophageal cancer Neg Hx   . Gallbladder disease Neg Hx   . Kidney disease Neg Hx     Social History   Social History  . Marital Status: Single    Spouse Name: N/A  . Number of Children: 0  . 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 Smoking status: Former Smoker --  1.00 packs/day for 20 years    Types: Cigarettes    Start date: 07/30/1992    Quit date: 12/16/2014  . Smokeless tobacco: Never Used  . Alcohol Use: 0.0 oz/week    0 Standard drinks or equivalent per week     Comment: 09/08/2015 "I'll have a few drinks/year"  . Drug Use: Yes     Comment: 09/08/2015 experimented some in college; not a regular user when I did use  . Sexual Activity: Not Currently   Other Topics Concern  . None   Social History Narrative    Review of Systems: A 12 point ROS discussed  Review of Systems  Constitutional: Negative for fever, chills, activity change, appetite change and fatigue.  HENT: Negative.   Respiratory: Negative for cough, shortness of breath and wheezing.   Cardiovascular: Negative for chest pain.  Gastrointestinal: Positive for abdominal pain. Negative for nausea and vomiting.    Genitourinary: Negative.   Musculoskeletal: Negative.   Skin: Negative.   Neurological: Negative.   Psychiatric/Behavioral: Negative.     Vital Signs: BP 148/86 mmHg  Pulse 93  Temp(Src) 98.1 F (36.7 C) (Oral)  Resp 18  Ht 6' (1.829 m)  Wt 349 lb 3.3 oz (158.4 kg)  BMI 47.35 kg/m2  SpO2 95%  Physical Exam  Constitutional: He is oriented to person, place, and time.  Obese, NAD  HENT:  Head: Atraumatic.  Eyes: EOM are normal.  Neck: Normal range of motion. Neck supple.  Cardiovascular: Normal rate, regular rhythm and normal heart sounds.   Pulmonary/Chest: Effort normal and breath sounds normal. No respiratory distress. He has no wheezes.  Abdominal: Soft. There is tenderness.  Musculoskeletal: Normal range of motion.  Neurological: He is alert and oriented to person, place, and time.  Skin: Skin is warm and dry.  Psychiatric: He has a normal mood and affect. His behavior is normal. Judgment and thought content normal.  Vitals reviewed.   Mallampati Score:  MD Evaluation Airway: WNL Heart: WNL Abdomen: WNL Chest/ Lungs: WNL ASA  Classification: 2 Mallampati/Airway Score: Two  Imaging: Ct Abdomen Pelvis W Contrast  09/12/2015  CLINICAL DATA:  Diverticulitis, followup EXAM: CT ABDOMEN AND PELVIS WITH CONTRAST TECHNIQUE: Multidetector CT imaging of the abdomen and pelvis was performed using the standard protocol following bolus administration of intravenous contrast. Sagittal and coronal MPR images reconstructed from axial data set. CONTRAST:  ISOVUE-300 IOPAMIDOL (ISOVUE-300) INJECTION 61% IV. Dilute oral contrast. COMPARISON:  09/06/2015 FINDINGS: Dependent atelectasis RIGHT lower lobe. Diffuse fatty infiltration of liver. Contracted gallbladder. Liver, spleen, pancreas, kidneys, and adrenal glands otherwise normal. Splenules adjacent to spleen. Marked wall thickening of the mid sigmoid colon, which swings RIGHT of midline in the RIGHT pelvis with note of colonic  diverticula and increased surrounding pericolic inflammatory changes compatible with persistent sigmoid diverticulitis. Inflammatory changes extend into the RIGHT mid abdomen adjacent to multiple small bowel loops and laterally as far as the cecum. Two new fluid collections are seen in the RIGHT lower quadrant concerning for developing abscess collections, measuring 5.1 x 3.5 x 4.2 cm more laterally and posteriorly and 3.8 x 2.3 x 2.8 cm more anteromedially. The larger of these collections appears to abut the medial wall of the cecum. Thickening of retroperitoneal fascia planes in the mid abdomen and pelvis. No definite free intraperitoneal air or fluid. Probable thickened appendix again noted. Stomach and remaining bowel loops grossly unremarkable without definite evidence of dilatation/obstruction. LEFT inguinal hernia containing fat. Unremarkable bladder and ureters for technique. No mass,  adenopathy, or acute osseous findings. IMPRESSION: Increased inflammatory changes in the RIGHT mid abdomen and RIGHT pelvis secondary to sigmoid diverticulitis other mildly redundant sigmoid colon. Two developing fluid collections are seen in the RIGHT mid abdomen concerning for developing abscesses as above. Electronically Signed   By: Lavonia Dana M.D.   On: 09/12/2015 17:15   Ct Abdomen Pelvis W Contrast  09/06/2015  CLINICAL DATA:  Right lower quadrant pain since Monday, worsening over the past 2 days. EXAM: CT ABDOMEN AND PELVIS WITH CONTRAST TECHNIQUE: Multidetector CT imaging of the abdomen and pelvis was performed using the standard protocol following bolus administration of intravenous contrast. CONTRAST:  15mL OMNIPAQUE IOHEXOL 300 MG/ML  SOLN COMPARISON:  11/17/2014 FINDINGS: The lung bases are clear.  Focal coronary artery calcifications. Prominent diffuse fatty infiltration of the liver with enlarged liver. The gallbladder, spleen, pancreas, adrenal glands, kidneys, abdominal aorta, inferior vena cava, and  retroperitoneal lymph nodes are unremarkable. Stomach, small bowel, and colon are not abnormally distended. No free air or free fluid in the abdomen. Small periumbilical hernia containing fat. Pelvis: The sigmoid colon swings over to the right side of the pelvis. In the right sigmoid colon, there is focal wall thickening associated with multiple diverticula. Infiltration in the pelvic fat around the sigmoid colon and extending to the right lower quadrant. Changes are consistent with acute diverticulitis. Appearance is similar to previous study. No abscess is identified but extraluminal gas is present adjacent to the Foley. Colonic wall thickening may be due to inflammatory process although underlying neoplasm should be excluded and evaluation the colon after resolution of acute process is suggested. There is associated inflammatory wall thickening of the adjacent terminal ileum. Scattered mesenteric lymph nodes are present. These are likely reactive. The appendix is retrocecal and is thickened since previous study, with diameter now measuring 11 mm. Reactive or coexisting appendicitis may be present. Bladder wall is not thickened. Prostate gland is not enlarged. No free or loculated pelvic fluid collections. No destructive bone lesions. IMPRESSION: Persistent or recurrent diverticulitis involving the sigmoid colon. Colonic wall thickening may be due to inflammation although on underlying colon lesion is not excluded. Edema and stranding around the sigmoid colon. No discrete abscess. There is reactive inflammatory wall thickening of the adjacent terminal ileum. The appendix is also dilated and reactive or coexisting appendicitis is not excluded. Diffuse fatty infiltration of the liver. Electronically Signed   By: Lucienne Capers M.D.   On: 09/06/2015 19:03    Labs:  CBC:  Recent Labs  09/07/15 0330 09/08/15 0753 09/10/15 0628 09/12/15 0435  WBC 10.9* 11.2* 10.4 10.2  HGB 13.6 12.8* 11.5* 11.6*  HCT  38.6* 38.2* 35.5* 36.5*  PLT 141* 125* 177 206    COAGS:  Recent Labs  09/07/15 0330  INR 1.19    BMP:  Recent Labs  09/07/15 0330 09/10/15 0628 09/11/15 0506 09/12/15 0435  NA 134* 136 138 142  K 4.9 3.0* 3.4* 3.8  CL 102 103 104 106  CO2 23 25 24 28   GLUCOSE 249* 193* 202* 179*  BUN <5* <5* <5* <5*  CALCIUM 8.4* 8.2* 8.3* 8.4*  CREATININE 0.84 0.69 0.65 0.69  GFRNONAA >60 >60 >60 >60  GFRAA >60 >60 >60 >60    LIVER FUNCTION TESTS:  Recent Labs  10/24/14 0535 09/06/15 1555 09/06/15 2221 09/07/15 0330  BILITOT 1.0 4.2* 3.2* 2.1*  AST 17 98* 75* 59*  ALT 33 52 18 36  ALKPHOS 81 95 76 78  PROT 6.5  7.2 >12.0* 6.1*  ALBUMIN 3.3* 3.8 3.1* 3.2*    TUMOR MARKERS: No results for input(s): AFPTM, CEA, CA199, CHROMGRNA in the last 8760 hours.  Assessment and Plan:  Abdominal fluid collection seen on CT scan  Dr. Annamaria Boots has reviewed films and will proceed with CT guided drainage/drain placement today.  Risks and Benefits discussed with the patient including bleeding, infection, damage to adjacent structures, bowel perforation/fistula connection, and sepsis.  All of the patient's questions were answered, patient is agreeable to proceed. Consent signed and in chart.  Thank you for this interesting consult.  I greatly enjoyed meeting Spencer Good and look forward to participating in their care.  A copy of this report was sent to the requesting provider on this date.  Electronically Signed: Murrell Redden PA-C 09/13/2015, 10:21 AM   I spent a total of 20 Minutes in face to face in clinical consultation, greater than 50% of which was counseling/coordinating care for CT drain placement

## 2015-09-14 LAB — CBC
HCT: 35.3 % — ABNORMAL LOW (ref 39.0–52.0)
Hemoglobin: 11.1 g/dL — ABNORMAL LOW (ref 13.0–17.0)
MCH: 26.4 pg (ref 26.0–34.0)
MCHC: 31.4 g/dL (ref 30.0–36.0)
MCV: 83.8 fL (ref 78.0–100.0)
Platelets: 229 10*3/uL (ref 150–400)
RBC: 4.21 MIL/uL — ABNORMAL LOW (ref 4.22–5.81)
RDW: 15.2 % (ref 11.5–15.5)
WBC: 7.9 10*3/uL (ref 4.0–10.5)

## 2015-09-14 LAB — BASIC METABOLIC PANEL
Anion gap: 7 (ref 5–15)
BUN: <5 mg/dL — ABNORMAL LOW (ref 6–20)
CO2: 30 mmol/L (ref 22–32)
Calcium: 8.4 mg/dL — ABNORMAL LOW (ref 8.9–10.3)
Chloride: 104 mmol/L (ref 101–111)
Creatinine, Ser: 0.69 mg/dL (ref 0.61–1.24)
GFR calc Af Amer: >60 mL/min (ref >60)
GFR calc non Af Amer: >60 mL/min (ref >60)
Glucose, Bld: 146 mg/dL — ABNORMAL HIGH (ref 65–99)
Potassium: 3.6 mmol/L (ref 3.5–5.1)
Sodium: 141 mmol/L (ref 135–145)

## 2015-09-14 LAB — GLUCOSE, CAPILLARY
Glucose-Capillary: 148 mg/dL — ABNORMAL HIGH (ref 65–99)
Glucose-Capillary: 119 mg/dL — ABNORMAL HIGH (ref 65–99)
Glucose-Capillary: 128 mg/dL — ABNORMAL HIGH (ref 65–99)
Glucose-Capillary: 199 mg/dL — ABNORMAL HIGH (ref 65–99)

## 2015-09-14 NOTE — Progress Notes (Signed)
Patient ID: Spencer Good, male   DOB: 07-Mar-1976, 40 y.o.   MRN: [id number] [facility] PROGRESS NOTE  VERLON KILBOURN [id number] DOB: 04-May-1976 DOA: 09/06/2015 PCP: Kathlene November, MD  Brief narrative:    40 year old male with past medical history of diverticulitis, DM who presented to [facility] ED with right lower quadrant abdominal pain started abruptly one day prior to the admission with no specific aggravating or alleviating factors. He also reported having some episodes of rectal bleed. Patient reports no subjective fevers. No reports of associated nausea or vomiting.  Patient was hemodynamically stable on the admission. He was found to have persistent or recurrent diverticulitis involving the sigmoid colon, colonic wall thickening which could be inflammation or underlying lesion, also seen was dilated and reactive coexisting appendicitis. Surgery is following. Patient was initially on ciprofloxacin and Flagyl which was then changed to [facility] 09/09/2015.  Repeat CT scan 09/12/2015 shows increasing inflammatory changes in the right mid abdomen and right pelvis secondary to sigmoid diverticulitis with abscess formation. Right lower quadrant drain placed 09/13/2015.  Assessment/Plan:    Principal problem: Acute right lower quadrant pain / diverticulitis of sigmoid colon with abscess without bleeding - Appreciate surgery following - Repeat CT scan 09/12/2015 with increasing inflammatory changes in the right mid abdomen and right pelvis secondary to sigmoid diverticulitis with abscess formation - RLQ drain placed per IR - Continue Invanz  Active problems: Hypokalemia - Secondary to GI related issues, diverticulitis - Potassium supplemented   Acute lower GI bleed - Likely diverticular - Hemoglobin stable  Uncontrolled diabetes mellitus without complications with long-term insulin use - A1c on this admission 13.9 indicating poor glycemic control - Continue insulin, Lantus 10 units at  bedtime along with sliding scale insulin   DVT Prophylaxis  - Lovenox subQ  Code Status: Full.  Family Communication:  plan of care discussed with the patient Disposition Plan: anticipate d/c once cleared by surgery   IV access:  Peripheral IV  Procedures and diagnostic studies:    Ct Abdomen Pelvis W Contrast 09/06/2015  Persistent or recurrent diverticulitis involving the sigmoid colon. Colonic wall thickening may be due to inflammation although on underlying colon lesion is not excluded. Edema and stranding around the sigmoid colon. No discrete abscess. There is reactive inflammatory wall thickening of the adjacent terminal ileum. The appendix is also dilated and reactive or coexisting appendicitis is not excluded. Diffuse fatty infiltration of the liver.   Ct Abdomen Pelvis W Contrast 09/12/2015   Increased inflammatory changes in the RIGHT mid abdomen and RIGHT pelvis secondary to sigmoid diverticulitis other mildly redundant sigmoid colon. Two developing fluid collections are seen in the RIGHT mid abdomen concerning for developing abscesses as above.   Medical Consultants:  Surgery  IR  Other Consultants:  None   IAnti-Infectives:   Ciprofloxacin stopped 09/09/15  Flagyl stopped 09/09/15 Invanz 09/09/2015 -->   Leisa Lenz, MD  [facility] Pager [phone]  Time spent in minutes: 5 minutes  If 7PM-7AM, please contact night-coverage [email or web address] Password [email or web address] 09/14/2015, 12:05 PM   LOS: 8 days    HPI/Subjective: No acute overnight events. Patient feels better.  Objective: Filed Vitals:   09/13/15 1421 09/13/15 1500 09/13/15 2226 09/14/15 0431  BP: 145/93 149/102 149/93 151/95  Pulse: 87 91 92 92  Temp:  97.9 F (36.6 C) 98.5 F (36.9 C) 98.1 F (36.7 C)  TempSrc:  Oral Oral Oral  Resp: 19 18 18 18   Height:      Weight:  SpO2: 98% 96% 95% 96%    Intake/Output Summary (Last 24 hours) at 09/14/15 1205 Last data filed at 09/14/15 1044  Gross per  24 hour  Intake   1567 ml  Output   1510 ml  Net     57 ml    Exam:   General:  Pt is not in distress  Cardiovascular: RRR, (+) S1, S2   Respiratory: no wheezing, no rhonchi   Abdomen: obese, (+) BS, non tender, drain in RLQ (+)  Extremities: No edema, palpable pulses   Neuro: Nonfocal   Data Reviewed: Basic Metabolic Panel:  Recent Labs Lab 09/10/15 0628 09/11/15 0506 09/12/15 0435 09/14/15 0445  NA 136 138 142 141  K 3.0* 3.4* 3.8 3.6  CL 103 104 106 104  CO2 25 24 28 30   GLUCOSE 193* 202* 179* 146*  BUN <5* <5* <5* <5*  CREATININE 0.69 0.65 0.69 0.69  CALCIUM 8.2* 8.3* 8.4* 8.4*   Liver Function Tests: No results for input(s): AST, ALT, ALKPHOS, BILITOT, PROT, ALBUMIN in the last 168 hours. No results for input(s): LIPASE, AMYLASE in the last 168 hours. No results for input(s): AMMONIA in the last 168 hours. CBC:  Recent Labs Lab 09/08/15 0753 09/10/15 0628 09/12/15 0435 09/14/15 0445  WBC 11.2* 10.4 10.2 7.9  HGB 12.8* 11.5* 11.6* 11.1*  HCT 38.2* 35.5* 36.5* 35.3*  MCV 82.7 83.1 83.9 83.8  PLT 125* 177 206 229   Cardiac Enzymes: No results for input(s): CKTOTAL, CKMB, CKMBINDEX, TROPONINI in the last 168 hours. BNP: Invalid input(s): POCBNP CBG:  Recent Labs Lab 09/13/15 0753 09/13/15 1223 09/13/15 1734 09/13/15 2225 09/14/15 0749  GLUCAP 168* 126* 120* 167* 148*    Recent Results (from the past 240 hour(s))  Culture, routine-abscess     Status: None (Preliminary result)   Collection Time: 09/13/15  2:39 PM  Result Value Ref Range Status   Specimen Description ABSCESS RIGHT ABDOMEN  Final   Special Requests Normal  Final   Gram Stain   Final    ABUNDANT WBC PRESENT,BOTH PMN AND MONONUCLEAR NO SQUAMOUS EPITHELIAL CELLS SEEN NO ORGANISMS SEEN Performed at [facility]    Culture PENDING  Incomplete   Report Status PENDING  Incomplete  Anaerobic culture     Status: None (Preliminary result)   Collection Time: 09/13/15   2:39 PM  Result Value Ref Range Status   Specimen Description ABSCESS RIGHT ABDOMEN  Final   Special Requests Normal  Final   Gram Stain   Final    ABUNDANT WBC PRESENT,BOTH PMN AND MONONUCLEAR NO SQUAMOUS EPITHELIAL CELLS SEEN NO ORGANISMS SEEN Performed at [facility]    Culture PENDING  Incomplete   Report Status PENDING  Incomplete     Scheduled Meds: . enoxaparin (LOVENOX) injection  80 mg Subcutaneous Q24H  . ertapenem  1 g Intravenous Q24H  . insulin aspart  0-9 Units Subcutaneous TID WC  . insulin aspart  10 Units Intravenous Once  . insulin glargine  10 Units Subcutaneous QHS  . saccharomyces boulardii  250 mg Oral BID  . sodium chloride flush  3 mL Intravenous Q12H   Continuous Infusions: . sodium chloride 0.9 % 1,000 mL with potassium chloride 30 mEq infusion 75 mL/hr at 09/13/15 2302

## 2015-09-14 NOTE — Progress Notes (Signed)
Up ambulatory in room ad lib, Egress completed.

## 2015-09-14 NOTE — Progress Notes (Signed)
Subjective: He is still sore, some scrotal edema also.  He is taking PO's well so I will saline lock his IV fluids.  Drainage has started to clear up.  It is a cloudy serous looking fluid this AM, significantly different from yesterday when he came back from IR with drain.    Objective: Vital signs in last 24 hours: Temp:  [97.9 F (36.6 C)-98.5 F (36.9 C)] 98.1 F (36.7 C) (03/30 0431) Pulse Rate:  [86-93] 92 (03/30 0431) Resp:  [14-19] 18 (03/30 0431) BP: (144-162)/(89-102) 151/95 mmHg (03/30 0431) SpO2:  [95 %-100 %] 96 % (03/30 0431) Last BM Date: 09/11/15 240 PO Soft diet 60 from the drain BM x 1 Afebrile, BP still up some, VSS Labs OK, WBC is still normal Intake/Output from previous day: 03/29 0701 - 03/30 0700 In: 1363 [P.O.:240; I.V.:1123] Out: 1510 [Urine:1450; Drains:60] Intake/Output this shift:    General appearance: alert, cooperative and no distress Resp: clear to auscultation bilaterally GI: soft still tender on the right, hard to tell if it is different from the predrain pain or just from the drain.  + BM Male genitalia: normal, some scrotal swelling, no pain  Lab Results:   Recent Labs  09/12/15 0435 09/14/15 0445  WBC 10.2 7.9  HGB 11.6* 11.1*  HCT 36.5* 35.3*  PLT 206 229    BMET  Recent Labs  09/12/15 0435 09/14/15 0445  NA 142 141  K 3.8 3.6  CL 106 104  CO2 28 30  GLUCOSE 179* 146*  BUN <5* <5*  CREATININE 0.69 0.69  CALCIUM 8.4* 8.4*   PT/INR No results for input(s): LABPROT, INR in the last 72 hours.  No results for input(s): AST, ALT, ALKPHOS, BILITOT, PROT, ALBUMIN in the last 168 hours.   Lipase     Component Value Date/Time   LIPASE 18 09/06/2015 1555     Studies/Results: Ct Abdomen Pelvis W Contrast  09/12/2015  CLINICAL DATA:  Diverticulitis, followup EXAM: CT ABDOMEN AND PELVIS WITH CONTRAST TECHNIQUE: Multidetector CT imaging of the abdomen and pelvis was performed using the standard protocol following bolus  administration of intravenous contrast. Sagittal and coronal MPR images reconstructed from axial data set. CONTRAST:  ISOVUE-300 IOPAMIDOL (ISOVUE-300) INJECTION 61% IV. Dilute oral contrast. COMPARISON:  09/06/2015 FINDINGS: Dependent atelectasis RIGHT lower lobe. Diffuse fatty infiltration of liver. Contracted gallbladder. Liver, spleen, pancreas, kidneys, and adrenal glands otherwise normal. Splenules adjacent to spleen. Marked wall thickening of the mid sigmoid colon, which swings RIGHT of midline in the RIGHT pelvis with note of colonic diverticula and increased surrounding pericolic inflammatory changes compatible with persistent sigmoid diverticulitis. Inflammatory changes extend into the RIGHT mid abdomen adjacent to multiple small bowel loops and laterally as far as the cecum. Two new fluid collections are seen in the RIGHT lower quadrant concerning for developing abscess collections, measuring 5.1 x 3.5 x 4.2 cm more laterally and posteriorly and 3.8 x 2.3 x 2.8 cm more anteromedially. The larger of these collections appears to abut the medial wall of the cecum. Thickening of retroperitoneal fascia planes in the mid abdomen and pelvis. No definite free intraperitoneal air or fluid. Probable thickened appendix again noted. Stomach and remaining bowel loops grossly unremarkable without definite evidence of dilatation/obstruction. LEFT inguinal hernia containing fat. Unremarkable bladder and ureters for technique. No mass, adenopathy, or acute osseous findings. IMPRESSION: Increased inflammatory changes in the RIGHT mid abdomen and RIGHT pelvis secondary to sigmoid diverticulitis other mildly redundant sigmoid colon. Two developing fluid collections are seen  in the RIGHT mid abdomen concerning for developing abscesses as above. Electronically Signed   By: Lavonia Dana M.D.   On: 09/12/2015 17:15   Ct Image Guided Fluid Drain By Catheter  09/13/2015  INDICATION: ENLARGING RIGHT LOWER QUADRANT DIVERTICULAR  ABSCESS EXAM: CT GUIDED DRAINAGE OF RIGHT LOWER QUADRANT DIVERTICULAR ABSCESS MEDICATIONS: The patient is currently admitted to the hospital and receiving intravenous antibiotics. The antibiotics were administered within an appropriate time frame prior to the initiation of the procedure. ANESTHESIA/SEDATION: 2.0 mg IV Versed 200 mcg IV Fentanyl Moderate Sedation Time:  15 minutes The patient was continuously monitored during the procedure by the interventional radiology nurse under my direct supervision. COMPLICATIONS: None immediate. TECHNIQUE: Informed written consent was obtained from the patient after a thorough discussion of the procedural risks, benefits and alternatives. All questions were addressed. Maximal Sterile Barrier Technique was utilized including caps, mask, sterile gowns, sterile gloves, sterile drape, hand hygiene and skin antiseptic. A timeout was performed prior to the initiation of the procedure. PROCEDURE: The right lower quadrant was prepped with ChloraPrep in a sterile fashion, and a sterile drape was applied covering the operative field. A sterile gown and sterile gloves were used for the procedure. Local anesthesia was provided with 1% Lidocaine. Previous imaging reviewed. Patient positioned supine. Noncontrast localization CT performed. The right lower quadrant abscess was localized. Under sterile conditions and local anesthesia, an 18 gauge 15 cm access needle was advanced percutaneously from a right anterior oblique approach. Needle position confirmed in the fluid collection with CT. Syringe aspiration yielded purulent fluid compatible with abscess. Sample sent for Gram stain and culture. Guidewire inserted followed by tract dilatation to insert a 10 [place] drain. Retention loop formed in the abscess. Position confirmed with CT. Catheter secured with a Prolene suture and connected to external suction bulb followed by sterile dressing. No immediate complication. Patient tolerated the  procedure well. FINDINGS: CT imaging confirms needle access of the right lower quadrant abscess for drain insertion. IMPRESSION: Successful CT-guided right lower quadrant abscess drain insertion. Purulent fluid aspirated. Gram stain and culture sent. Electronically Signed   By: Jerilynn Mages.  Shick M.D.   On: 09/13/2015 14:49    Medications: . enoxaparin (LOVENOX) injection  80 mg Subcutaneous Q24H  . ertapenem  1 g Intravenous Q24H  . insulin aspart  0-9 Units Subcutaneous TID WC  . insulin aspart  10 Units Intravenous Once  . insulin glargine  10 Units Subcutaneous QHS  . saccharomyces boulardii  250 mg Oral BID  . sodium chloride flush  3 mL Intravenous Q12H    Assessment/Plan Diverticulitis vs appendicitis (we believe diverticulitis) AODM Hx of OSA ADHD Gout Body mass index is 47 Hypokalemia/Hyponatremia Antibiotics: Lovenox/SCD DVT: Day 9 antibiotics; day 6 Invanz  Plan:  Continue IV antibiotics, saline lock IV, I told him to get some supportive underwear, and ambulate.  Watch drainage from new IR drain.      LOS: 8 days    Britiney Blahnik 09/14/2015 [phone]

## 2015-09-14 NOTE — Progress Notes (Signed)
Referring Physician(s): Dr. Judeth Horn  Supervising Physician: Markus Daft  Chief Complaint: RLQ abscess  Subjective: S/p perc drain to RLQ abscess Feeling better overall. Sore at drain site as expected.  Allergies: Review of patient's allergies indicates no known allergies.  Medications:  Current facility-administered medications:  .  acetaminophen (TYLENOL) tablet 650 mg, 650 mg, Oral, Q6H PRN **OR** acetaminophen (TYLENOL) suppository 650 mg, 650 mg, Rectal, Q6H PRN, Toy Baker, MD .  enoxaparin (LOVENOX) injection 80 mg, 80 mg, Subcutaneous, Q24H, Earnstine Regal, PA-C, 80 mg at 09/13/15 2254 .  ertapenem (INVANZ) 1 g in sodium chloride 0.9 % 50 mL IVPB, 1 g, Intravenous, Q24H, Fanny Skates, MD, 1 g at 09/14/15 0809 .  HYDROcodone-acetaminophen (NORCO/VICODIN) 5-325 MG per tablet 1-2 tablet, 1-2 tablet, Oral, Q4H PRN, Toy Baker, MD, 2 tablet at 09/14/15 1128 .  insulin aspart (novoLOG) injection 0-9 Units, 0-9 Units, Subcutaneous, TID WC, Gardiner Barefoot, NP, 1 Units at 09/14/15 0815 .  insulin aspart (novoLOG) injection 10 Units, 10 Units, Intravenous, Once, Virgel Manifold, MD, Stopped at 09/06/15 2105 .  insulin glargine (LANTUS) injection 10 Units, 10 Units, Subcutaneous, QHS, Oswald Hillock, MD, 10 Units at 09/13/15 2302 .  morphine 4 MG/ML injection 4 mg, 4 mg, Intravenous, Q3H PRN, Gardiner Barefoot, NP, 4 mg at 09/10/15 2006 .  ondansetron (ZOFRAN) tablet 4 mg, 4 mg, Oral, Q6H PRN **OR** ondansetron (ZOFRAN) injection 4 mg, 4 mg, Intravenous, Q6H PRN, Toy Baker, MD, 4 mg at 09/07/15 2035 .  saccharomyces boulardii (FLORASTOR) capsule 250 mg, 250 mg, Oral, BID, Earnstine Regal, PA-C, 250 mg at 09/14/15 1040 .  sodium chloride 0.9 % 1,000 mL with potassium chloride 30 mEq infusion, , Intravenous, Continuous, Fanny Skates, MD, Last Rate: 75 mL/hr at 09/13/15 2302 .  sodium chloride flush (NS) 0.9 % injection 3 mL, 3 mL, Intravenous,  Q12H, Toy Baker, MD, 3 mL at 09/14/15 1042 .  zolpidem (AMBIEN) tablet 5 mg, 5 mg, Oral, QHS PRN, Oswald Hillock, MD, 5 mg at 09/14/15 0119    Vital Signs: BP 151/95 mmHg  Pulse 92  Temp(Src) 98.1 F (36.7 C) (Oral)  Resp 18  Ht 6' (1.829 m)  Wt 349 lb 3.3 oz (158.4 kg)  BMI 47.35 kg/m2  SpO2 96%  Physical Exam RLQ drain intact, site clean, dry Output now more serosanguinous though still some debris.   Imaging: Ct Abdomen Pelvis W Contrast  09/12/2015  CLINICAL DATA:  Diverticulitis, followup EXAM: CT ABDOMEN AND PELVIS WITH CONTRAST TECHNIQUE: Multidetector CT imaging of the abdomen and pelvis was performed using the standard protocol following bolus administration of intravenous contrast. Sagittal and coronal MPR images reconstructed from axial data set. CONTRAST:  ISOVUE-300 IOPAMIDOL (ISOVUE-300) INJECTION 61% IV. Dilute oral contrast. COMPARISON:  09/06/2015 FINDINGS: Dependent atelectasis RIGHT lower lobe. Diffuse fatty infiltration of liver. Contracted gallbladder. Liver, spleen, pancreas, kidneys, and adrenal glands otherwise normal. Splenules adjacent to spleen. Marked wall thickening of the mid sigmoid colon, which swings RIGHT of midline in the RIGHT pelvis with note of colonic diverticula and increased surrounding pericolic inflammatory changes compatible with persistent sigmoid diverticulitis. Inflammatory changes extend into the RIGHT mid abdomen adjacent to multiple small bowel loops and laterally as far as the cecum. Two new fluid collections are seen in the RIGHT lower quadrant concerning for developing abscess collections, measuring 5.1 x 3.5 x 4.2 cm more laterally and posteriorly and 3.8 x 2.3 x 2.8 cm more anteromedially. The larger of these collections appears  to abut the medial wall of the cecum. Thickening of retroperitoneal fascia planes in the mid abdomen and pelvis. No definite free intraperitoneal air or fluid. Probable thickened appendix again noted. Stomach  and remaining bowel loops grossly unremarkable without definite evidence of dilatation/obstruction. LEFT inguinal hernia containing fat. Unremarkable bladder and ureters for technique. No mass, adenopathy, or acute osseous findings. IMPRESSION: Increased inflammatory changes in the RIGHT mid abdomen and RIGHT pelvis secondary to sigmoid diverticulitis other mildly redundant sigmoid colon. Two developing fluid collections are seen in the RIGHT mid abdomen concerning for developing abscesses as above. Electronically Signed   By: Lavonia Dana M.D.   On: 09/12/2015 17:15   Ct Image Guided Fluid Drain By Catheter  09/13/2015  INDICATION: ENLARGING RIGHT LOWER QUADRANT DIVERTICULAR ABSCESS EXAM: CT GUIDED DRAINAGE OF RIGHT LOWER QUADRANT DIVERTICULAR ABSCESS MEDICATIONS: The patient is currently admitted to the hospital and receiving intravenous antibiotics. The antibiotics were administered within an appropriate time frame prior to the initiation of the procedure. ANESTHESIA/SEDATION: 2.0 mg IV Versed 200 mcg IV Fentanyl Moderate Sedation Time:  15 minutes The patient was continuously monitored during the procedure by the interventional radiology nurse under my direct supervision. COMPLICATIONS: None immediate. TECHNIQUE: Informed written consent was obtained from the patient after a thorough discussion of the procedural risks, benefits and alternatives. All questions were addressed. Maximal Sterile Barrier Technique was utilized including caps, mask, sterile gowns, sterile gloves, sterile drape, hand hygiene and skin antiseptic. A timeout was performed prior to the initiation of the procedure. PROCEDURE: The right lower quadrant was prepped with ChloraPrep in a sterile fashion, and a sterile drape was applied covering the operative field. A sterile gown and sterile gloves were used for the procedure. Local anesthesia was provided with 1% Lidocaine. Previous imaging reviewed. Patient positioned supine. Noncontrast  localization CT performed. The right lower quadrant abscess was localized. Under sterile conditions and local anesthesia, an 18 gauge 15 cm access needle was advanced percutaneously from a right anterior oblique approach. Needle position confirmed in the fluid collection with CT. Syringe aspiration yielded purulent fluid compatible with abscess. Sample sent for Gram stain and culture. Guidewire inserted followed by tract dilatation to insert a 10 [place] drain. Retention loop formed in the abscess. Position confirmed with CT. Catheter secured with a Prolene suture and connected to external suction bulb followed by sterile dressing. No immediate complication. Patient tolerated the procedure well. FINDINGS: CT imaging confirms needle access of the right lower quadrant abscess for drain insertion. IMPRESSION: Successful CT-guided right lower quadrant abscess drain insertion. Purulent fluid aspirated. Gram stain and culture sent. Electronically Signed   By: Jerilynn Mages.  Shick M.D.   On: 09/13/2015 14:49    Labs:  CBC:  Recent Labs  09/08/15 0753 09/10/15 0628 09/12/15 0435 09/14/15 0445  WBC 11.2* 10.4 10.2 7.9  HGB 12.8* 11.5* 11.6* 11.1*  HCT 38.2* 35.5* 36.5* 35.3*  PLT 125* 177 206 229    COAGS:  Recent Labs  09/07/15 0330  INR 1.19    BMP:  Recent Labs  09/10/15 0628 09/11/15 0506 09/12/15 0435 09/14/15 0445  NA 136 138 142 141  K 3.0* 3.4* 3.8 3.6  CL 103 104 106 104  CO2 25 24 28 30   GLUCOSE 193* 202* 179* 146*  BUN <5* <5* <5* <5*  CALCIUM 8.2* 8.3* 8.4* 8.4*  CREATININE 0.69 0.65 0.69 0.69  GFRNONAA >60 >60 >60 >60  GFRAA >60 >60 >60 >60    LIVER FUNCTION TESTS:  Recent Labs  10/24/14 0535 09/06/15 1555 09/06/15 2221 09/07/15 0330  BILITOT 1.0 4.2* 3.2* 2.1*  AST 17 98* 75* 59*  ALT 33 52 18 36  ALKPHOS 81 95 76 78  PROT 6.5 7.2 >12.0* 6.1*  ALBUMIN 3.3* 3.8 3.1* 3.2*    Assessment and Plan: RLQ abscess s/p perc drain(etiology thought to be  diverticular) Plans per [facility] If discharged soon, will need outpt CT +/- drain injection prior to drain removal.  Electronically Signed: Ascencion Dike 09/14/2015, 11:44 AM   I spent a total of 15 Minutes at the the patient's bedside AND on the patient's hospital floor or unit, greater than 50% of which was counseling/coordinating care for RLQ abscess drain

## 2015-09-15 LAB — GLUCOSE, CAPILLARY: Glucose-Capillary: 151 mg/dL — ABNORMAL HIGH (ref 65–99)

## 2015-09-15 MED ORDER — METFORMIN HCL 500 MG PO TABS
500.0000 mg | ORAL_TABLET | Freq: Two times a day (BID) | ORAL | Status: DC
Start: 2015-09-15 — End: 2015-09-22

## 2015-09-15 MED ORDER — AMOXICILLIN-POT CLAVULANATE 875-125 MG PO TABS: 1.0000 | ORAL_TABLET | Freq: Two times a day (BID) | ORAL | Status: DC

## 2015-09-15 MED ORDER — OXYCODONE HCL 5 MG PO TABS: 5.0000 mg | ORAL_TABLET | ORAL | Status: DC | PRN

## 2015-09-15 MED ORDER — SACCHAROMYCES BOULARDII 250 MG PO CAPS: 250.0000 mg | ORAL_CAPSULE | Freq: Two times a day (BID) | ORAL | Status: DC

## 2015-09-15 NOTE — Progress Notes (Signed)
Referring Physician(s): Dr Judeth Horn  Supervising Physician: Daryll Brod  Chief Complaint:  Diverticular abscess Drain placed 3/29  Subjective:  Better daily afeb Wbc wnl Plan home today per notes   Allergies: Review of patient's allergies indicates no known allergies.  Medications: Prior to Admission medications   Medication Sig Start Date End Date Taking? Authorizing Provider  Multiple Vitamin (MULTIVITAMIN) tablet Take 1 tablet by mouth daily. Vegan Multi-Pak   Yes Historical Provider, MD  oxyCODONE (OXY IR/ROXICODONE) 5 MG immediate release tablet Take 1-2 tablets (5-10 mg total) by mouth every 4 (four) hours as needed for moderate pain. 02/02/15  Yes Rolm Bookbinder, MD  glucose blood (ONE TOUCH ULTRA TEST) test strip Check blood sugars no more than twice daily. Patient not taking: Reported on 09/06/2015 11/15/14   Colon Branch, MD     Vital Signs: BP 153/95 mmHg  Pulse 89  Temp(Src) 98 F (36.7 C) (Oral)  Resp 19  Ht 6' (1.829 m)  Wt 349 lb 3.3 oz (158.4 kg)  BMI 47.35 kg/m2  SpO2 98%  Physical Exam  Abdominal: Soft.  Skin: Skin is warm and dry.  Site of drain is clean and dry Sl tender No sign of infection No bleeding  Output serous color- blood tinged 60 cc yesterday No orgs    Imaging: Ct Abdomen Pelvis W Contrast  09/12/2015  CLINICAL DATA:  Diverticulitis, followup EXAM: CT ABDOMEN AND PELVIS WITH CONTRAST TECHNIQUE: Multidetector CT imaging of the abdomen and pelvis was performed using the standard protocol following bolus administration of intravenous contrast. Sagittal and coronal MPR images reconstructed from axial data set. CONTRAST:  ISOVUE-300 IOPAMIDOL (ISOVUE-300) INJECTION 61% IV. Dilute oral contrast. COMPARISON:  09/06/2015 FINDINGS: Dependent atelectasis RIGHT lower lobe. Diffuse fatty infiltration of liver. Contracted gallbladder. Liver, spleen, pancreas, kidneys, and adrenal glands otherwise normal. Splenules adjacent to  spleen. Marked wall thickening of the mid sigmoid colon, which swings RIGHT of midline in the RIGHT pelvis with note of colonic diverticula and increased surrounding pericolic inflammatory changes compatible with persistent sigmoid diverticulitis. Inflammatory changes extend into the RIGHT mid abdomen adjacent to multiple small bowel loops and laterally as far as the cecum. Two new fluid collections are seen in the RIGHT lower quadrant concerning for developing abscess collections, measuring 5.1 x 3.5 x 4.2 cm more laterally and posteriorly and 3.8 x 2.3 x 2.8 cm more anteromedially. The larger of these collections appears to abut the medial wall of the cecum. Thickening of retroperitoneal fascia planes in the mid abdomen and pelvis. No definite free intraperitoneal air or fluid. Probable thickened appendix again noted. Stomach and remaining bowel loops grossly unremarkable without definite evidence of dilatation/obstruction. LEFT inguinal hernia containing fat. Unremarkable bladder and ureters for technique. No mass, adenopathy, or acute osseous findings. IMPRESSION: Increased inflammatory changes in the RIGHT mid abdomen and RIGHT pelvis secondary to sigmoid diverticulitis other mildly redundant sigmoid colon. Two developing fluid collections are seen in the RIGHT mid abdomen concerning for developing abscesses as above. Electronically Signed   By: Lavonia Dana M.D.   On: 09/12/2015 17:15   Ct Image Guided Fluid Drain By Catheter  09/13/2015  INDICATION: ENLARGING RIGHT LOWER QUADRANT DIVERTICULAR ABSCESS EXAM: CT GUIDED DRAINAGE OF RIGHT LOWER QUADRANT DIVERTICULAR ABSCESS MEDICATIONS: The patient is currently admitted to the hospital and receiving intravenous antibiotics. The antibiotics were administered within an appropriate time frame prior to the initiation of the procedure. ANESTHESIA/SEDATION: 2.0 mg IV Versed 200 mcg IV Fentanyl Moderate Sedation Time:  15 minutes The patient was continuously monitored  during the procedure by the interventional radiology nurse under my direct supervision. COMPLICATIONS: None immediate. TECHNIQUE: Informed written consent was obtained from the patient after a thorough discussion of the procedural risks, benefits and alternatives. All questions were addressed. Maximal Sterile Barrier Technique was utilized including caps, mask, sterile gowns, sterile gloves, sterile drape, hand hygiene and skin antiseptic. A timeout was performed prior to the initiation of the procedure. PROCEDURE: The right lower quadrant was prepped with ChloraPrep in a sterile fashion, and a sterile drape was applied covering the operative field. A sterile gown and sterile gloves were used for the procedure. Local anesthesia was provided with 1% Lidocaine. Previous imaging reviewed. Patient positioned supine. Noncontrast localization CT performed. The right lower quadrant abscess was localized. Under sterile conditions and local anesthesia, an 18 gauge 15 cm access needle was advanced percutaneously from a right anterior oblique approach. Needle position confirmed in the fluid collection with CT. Syringe aspiration yielded purulent fluid compatible with abscess. Sample sent for Gram stain and culture. Guidewire inserted followed by tract dilatation to insert a 10 [place] drain. Retention loop formed in the abscess. Position confirmed with CT. Catheter secured with a Prolene suture and connected to external suction bulb followed by sterile dressing. No immediate complication. Patient tolerated the procedure well. FINDINGS: CT imaging confirms needle access of the right lower quadrant abscess for drain insertion. IMPRESSION: Successful CT-guided right lower quadrant abscess drain insertion. Purulent fluid aspirated. Gram stain and culture sent. Electronically Signed   By: Jerilynn Mages.  Shick M.D.   On: 09/13/2015 14:49    Labs:  CBC:  Recent Labs  09/08/15 0753 09/10/15 0628 09/12/15 0435 09/14/15 0445  WBC 11.2*  10.4 10.2 7.9  HGB 12.8* 11.5* 11.6* 11.1*  HCT 38.2* 35.5* 36.5* 35.3*  PLT 125* 177 206 229    COAGS:  Recent Labs  09/07/15 0330  INR 1.19    BMP:  Recent Labs  09/10/15 0628 09/11/15 0506 09/12/15 0435 09/14/15 0445  NA 136 138 142 141  K 3.0* 3.4* 3.8 3.6  CL 103 104 106 104  CO2 25 24 28 30   GLUCOSE 193* 202* 179* 146*  BUN <5* <5* <5* <5*  CALCIUM 8.2* 8.3* 8.4* 8.4*  CREATININE 0.69 0.65 0.69 0.69  GFRNONAA >60 >60 >60 >60  GFRAA >60 >60 >60 >60    LIVER FUNCTION TESTS:  Recent Labs  10/24/14 0535 09/06/15 1555 09/06/15 2221 09/07/15 0330  BILITOT 1.0 4.2* 3.2* 2.1*  AST 17 98* 75* 59*  ALT 33 52 18 36  ALKPHOS 81 95 76 78  PROT 6.5 7.2 >12.0* 6.1*  ALBUMIN 3.3* 3.8 3.1* 3.2*    Assessment and Plan:  divertic abscess RLQ abscess drain placed 3/29 Doing well Draining well For dc today per notes Plan for drain clinic as OP Pt will hear from clinic scheduler for time and date  Electronically Signed: Dionisios Ricci A 09/15/2015, 8:19 AM   I spent a total of 15 Minutes at the the patient's bedside AND on the patient's hospital floor or unit, greater than 50% of which was counseling/coordinating care for RLQ abscess drain

## 2015-09-15 NOTE — Progress Notes (Signed)
Subjective: He is still sore on the right, but better.  Tolerating diet.    Objective: Vital signs in last 24 hours: Temp:  [98 F (36.7 C)-99.2 F (37.3 C)] 98 F (36.7 C) (03/31 0531) Pulse Rate:  [89-95] 89 (03/31 0531) Resp:  [19] 19 (03/31 0531) BP: (121-153)/(91-96) 153/95 mmHg (03/31 0531) SpO2:  [97 %-98 %] 98 % (03/31 0531) Last BM Date: 09/14/15 Soft diet 60 ml from the drain Afebrile, VSS No labs this AM Intake/Output from previous day: 03/30 0701 - 03/31 0700 In: 924 [P.O.:720; IV Piggyback:204] Out: 1860 [Urine:1800; Drains:60] Intake/Output this shift:    General appearance: alert, cooperative and no distress GI: soft, sore on the right, drain is putting out serous but still cloudy fluid.    Lab Results:   Recent Labs  09/14/15 0445  WBC 7.9  HGB 11.1*  HCT 35.3*  PLT 229    BMET  Recent Labs  09/14/15 0445  NA 141  K 3.6  CL 104  CO2 30  GLUCOSE 146*  BUN <5*  CREATININE 0.69  CALCIUM 8.4*   PT/INR No results for input(s): LABPROT, INR in the last 72 hours.  No results for input(s): AST, ALT, ALKPHOS, BILITOT, PROT, ALBUMIN in the last 168 hours.   Lipase     Component Value Date/Time   LIPASE 18 09/06/2015 1555     Studies/Results: Ct Image Guided Fluid Drain By Catheter  09/13/2015  INDICATION: ENLARGING RIGHT LOWER QUADRANT DIVERTICULAR ABSCESS EXAM: CT GUIDED DRAINAGE OF RIGHT LOWER QUADRANT DIVERTICULAR ABSCESS MEDICATIONS: The patient is currently admitted to the hospital and receiving intravenous antibiotics. The antibiotics were administered within an appropriate time frame prior to the initiation of the procedure. ANESTHESIA/SEDATION: 2.0 mg IV Versed 200 mcg IV Fentanyl Moderate Sedation Time:  15 minutes The patient was continuously monitored during the procedure by the interventional radiology nurse under my direct supervision. COMPLICATIONS: None immediate. TECHNIQUE: Informed written consent was obtained from the  patient after a thorough discussion of the procedural risks, benefits and alternatives. All questions were addressed. Maximal Sterile Barrier Technique was utilized including caps, mask, sterile gowns, sterile gloves, sterile drape, hand hygiene and skin antiseptic. A timeout was performed prior to the initiation of the procedure. PROCEDURE: The right lower quadrant was prepped with ChloraPrep in a sterile fashion, and a sterile drape was applied covering the operative field. A sterile gown and sterile gloves were used for the procedure. Local anesthesia was provided with 1% Lidocaine. Previous imaging reviewed. Patient positioned supine. Noncontrast localization CT performed. The right lower quadrant abscess was localized. Under sterile conditions and local anesthesia, an 18 gauge 15 cm access needle was advanced percutaneously from a right anterior oblique approach. Needle position confirmed in the fluid collection with CT. Syringe aspiration yielded purulent fluid compatible with abscess. Sample sent for Gram stain and culture. Guidewire inserted followed by tract dilatation to insert a 10 [place] drain. Retention loop formed in the abscess. Position confirmed with CT. Catheter secured with a Prolene suture and connected to external suction bulb followed by sterile dressing. No immediate complication. Patient tolerated the procedure well. FINDINGS: CT imaging confirms needle access of the right lower quadrant abscess for drain insertion. IMPRESSION: Successful CT-guided right lower quadrant abscess drain insertion. Purulent fluid aspirated. Gram stain and culture sent. Electronically Signed   By: Jerilynn Mages.  Shick M.D.   On: 09/13/2015 14:49    Medications: . enoxaparin (LOVENOX) injection  80 mg Subcutaneous Q24H  . ertapenem  1 g  Intravenous Q24H  . insulin aspart  0-9 Units Subcutaneous TID WC  . insulin aspart  10 Units Intravenous Once  . insulin glargine  10 Units Subcutaneous QHS  . saccharomyces boulardii   250 mg Oral BID  . sodium chloride flush  3 mL Intravenous Q12H    Assessment/Plan Diverticulitis vs appendicitis (we believe diverticulitis) IR drain placement 09/13/15, Dr. Annamaria Boots AODM Hx of OSA ADHD Gout Body mass index is 47 Hypokalemia/Hyponatremia Antibiotics: Lovenox/SCD DVT: Day 10 antibiotics; day 7 Invanz    Plan:  I would let him get his AM dose of Invanz, and send him home on 4 more days of oral antibiotics, total of 14 days.  He needs follow up in the drain clinic next week and then follow up 10-14 days with Dr.Wyatt.  I have put in a note to our office to look for an appointment with him.  Staff is teaching him drain care.  He does not think he needs Home health nurse for this.  I would keep him on probiotic at home also for the next couple weeks.    LOS: 9 days    Maysen Sudol 09/15/2015 [phone]

## 2015-09-15 NOTE — Discharge Instructions (Signed)
Percutaneous Abscess Drain, Care After Refer to this sheet in the next few weeks. These instructions provide you with information on caring for yourself after your procedure. Your health care provider may also give you more specific instructions. Your treatment has been planned according to current medical practices, but problems sometimes occur. Call your health care provider if you have any problems or questions after your procedure. WHAT TO EXPECT AFTER THE PROCEDURE After your procedure, it is typical to have the following:   A small amount of discomfort in the area where the drainage tube was placed.  A small amount of bruising around the area where the drainage tube was placed.  Sleepiness and fatigue for the rest of the day from the medicines used. HOME CARE INSTRUCTIONS  Rest at home for 1-2 days following your procedure or as directed by your health care provider.  If you go home right after the procedure, plan to have someone with you for 24 hours.  Do not take a bathor shower for 24 hours after your procedure.  Take medicines only as directed by your health care provider. Ask your health care provider when you can resume taking any normal medicines.  Change bandages (dressings) as directed.   You may be told to record the amount of drainage from the bag every time you empty it. Follow your health care provider's directions for emptying the bag. Write down the amount of drainage, the date, and the time you emptied it.  Call your health care provider when the drain is putting out less than 10 mL of drainage per day for 2-3 days in a row or as directed by your health care provider.  Follow your health care provider's instructions for cleaning the drainage tube. You may need to clean the tube every day so that it does not clog. SEEK MEDICAL CARE IF:  You have increased bleeding (more than a small spot) from the site where the drainage tube was placed.  You have redness,  swelling, or increasing pain around the site where the drainage tube was placed.  You notice a discharge or bad smell coming from the site where the drainage tube was placed.  You have a fever or chills.  You have pain that is not helped by medicine.  SEEK IMMEDIATE MEDICAL CARE IF:  There is leakage around the drainage tube.  The drainage tube pulls out.  You suddenly stop having drainage from the tube.  You suddenly have blood in the drainage fluid.  You become dizzy or faint.  You develop a rash.   You have nausea or vomiting.  You have difficulty breathing, feel short of breath, or feel faint.   You develop chest pain.  You have problems with your speech or vision.  You have trouble balancing or moving your arms or legs.   This information is not intended to replace advice given to you by your health care provider. Make sure you discuss any questions you have with your health care provider.   Document Released: 10/18/2013 Document Revised: 03/22/2014 Document Reviewed: 10/18/2013 Elsevier Interactive Patient Education 2016 [employer].   Diverticulitis  Diverticulitis is inflammation or infection of small pouches in your colon that form when you have a condition called diverticulosis. The pouches in your colon are called diverticula. Your colon, or large intestine, is where water is absorbed and stool is formed. Complications of diverticulitis can include:  Bleeding.  Severe infection.  Severe pain.  Perforation of your colon.  Obstruction  of your colon. CAUSES  Diverticulitis is caused by bacteria. Diverticulitis happens when stool becomes trapped in diverticula. This allows bacteria to grow in the diverticula, which can lead to inflammation and infection. RISK FACTORS People with diverticulosis are at risk for diverticulitis. Eating a diet that does not include enough fiber from fruits and vegetables may make diverticulitis more likely to  develop. SYMPTOMS  Symptoms of diverticulitis may include:  Abdominal pain and tenderness. The pain is normally located on the left side of the abdomen, but may occur in other areas.  Fever and chills.  Bloating.  Cramping.  Nausea.  Vomiting.  Constipation.  Diarrhea.  Blood in your stool. DIAGNOSIS  Your health care provider will ask you about your medical history and do a physical exam. You may need to have tests done because many medical conditions can cause the same symptoms as diverticulitis. Tests may include:  Blood tests.  Urine tests.  Imaging tests of the abdomen, including X-rays and CT scans. When your condition is under control, your health care provider may recommend that you have a colonoscopy. A colonoscopy can show how severe your diverticula are and whether something else is causing your symptoms. TREATMENT  Most cases of diverticulitis are mild and can be treated at home. Treatment may include:  Taking over-the-counter pain medicines.  Following a clear liquid diet.  Taking antibiotic medicines by mouth for 7-10 days. More severe cases may be treated at a hospital. Treatment may include:  Not eating or drinking.  Taking prescription pain medicine.  Receiving antibiotic medicines through an IV tube.  Receiving fluids and nutrition through an IV tube.  Surgery. HOME CARE INSTRUCTIONS   Follow your health care provider's instructions carefully.  Follow a full liquid diet or other diet as directed by your health care provider. After your symptoms improve, your health care provider may tell you to change your diet. He or she may recommend you eat a high-fiber diet. Fruits and vegetables are good sources of fiber. Fiber makes it easier to pass stool.  Take fiber supplements or probiotics as directed by your health care provider.  Only take medicines as directed by your health care provider.  Keep all your follow-up appointments. SEEK MEDICAL  CARE IF:   Your pain does not improve.  You have a hard time eating food.  Your bowel movements do not return to normal. SEEK IMMEDIATE MEDICAL CARE IF:   Your pain becomes worse.  Your symptoms do not get better.  Your symptoms suddenly get worse.  You have a fever.  You have repeated vomiting.  You have bloody or black, tarry stools. MAKE SURE YOU:   Understand these instructions.  Will watch your condition.  Will get help right away if you are not doing well or get worse.   This information is not intended to replace advice given to you by your health care provider. Make sure you discuss any questions you have with your health care provider.   Document Released: 03/13/2005 Document Revised: 06/08/2013 Document Reviewed: 04/28/2013 Elsevier Interactive Patient Education [employer].

## 2015-09-15 NOTE — Discharge Summary (Signed)
Physician Discharge Summary  Spencer Good [id number] DOB: 1975-08-07 DOA: 09/06/2015   PCP: Kathlene November, MD  Admit date: 09/06/2015 Discharge date: 09/15/2015  Recommendations for Outpatient Follow-up:  Continue Augmentin for 4 days on discharge Continue metformin 500 mg twice a day on discharge  Discharge Diagnoses:  Principal Problem:   Acute right lower quadrant pain Active Problems:   Diverticulitis of large intestine with abscess without bleeding   Acute lower GI bleeding   Hypokalemia    Discharge Condition: stable   Diet recommendation: as tolerated   History of present illness:  40 year old male with past medical history of diverticulitis, DM who presented to [facility] ED with right lower quadrant abdominal pain started abruptly one day prior to the admission with no specific aggravating or alleviating factors. He also reported having some episodes of rectal bleed. Patient reports no subjective fevers. No reports of associated nausea or vomiting.  Patient was hemodynamically stable on the admission. He was found to have persistent or recurrent diverticulitis involving the sigmoid colon, colonic wall thickening which could be inflammation or underlying lesion, also seen was dilated and reactive coexisting appendicitis. Surgery is following. Patient was initially on ciprofloxacin and Flagyl which was then changed to [facility] 09/09/2015.  Repeat CT scan 09/12/2015 shows increasing inflammatory changes in the right mid abdomen and right pelvis secondary to sigmoid diverticulitis with abscess formation. Right lower quadrant drain placed 09/13/2015.  Hospital Course:    Assessment/Plan:    Principal problem: Acute right lower quadrant pain / diverticulitis of sigmoid colon with abscess without bleeding - Appreciate surgery following - Repeat CT scan 09/12/2015 with increasing inflammatory changes in the right mid abdomen and right pelvis secondary to sigmoid diverticulitis with  abscess formation - RLQ drain placed per IR 3/29 - Continue Invanz through today - D/C with Augmentin for 4 more days - He has appt with surgery set up   Active problems: Hypokalemia - Secondary to GI related issues, diverticulitis - Potassium supplemented   Acute lower GI bleed - Likely diverticular - Hemoglobin stable  Uncontrolled diabetes mellitus without complications with long-term insulin use - A1c on this admission 13.9 indicating poor glycemic control - He refused insulin - Prescription provided for metformin 500 mg po BID   DVT Prophylaxis  - Lovenox subQ in hospital   Code Status: Full.  Family Communication: plan of care discussed with the patient   IV access:  Peripheral IV  Procedures and diagnostic studies:   Ct Abdomen Pelvis W Contrast 09/06/2015 Persistent or recurrent diverticulitis involving the sigmoid colon. Colonic wall thickening may be due to inflammation although on underlying colon lesion is not excluded. Edema and stranding around the sigmoid colon. No discrete abscess. There is reactive inflammatory wall thickening of the adjacent terminal ileum. The appendix is also dilated and reactive or coexisting appendicitis is not excluded. Diffuse fatty infiltration of the liver.   Ct Abdomen Pelvis W Contrast 09/12/2015 Increased inflammatory changes in the RIGHT mid abdomen and RIGHT pelvis secondary to sigmoid diverticulitis other mildly redundant sigmoid colon. Two developing fluid collections are seen in the RIGHT mid abdomen concerning for developing abscesses as above.   Medical Consultants:  Surgery  IR  Other Consultants:  None   IAnti-Infectives:   Ciprofloxacin stopped 09/09/15  Flagyl stopped 09/09/15 Invanz 09/09/2015 --> 09/15/2015   Signed:  Leisa Lenz, MD  [facility] 09/15/2015, 11:20 AM  Pager #: [phone]  Time spent in minutes: more than 30 minutes   Discharge Exam:  Filed Vitals:    09/14/15 2120 09/15/15 0531  BP: 121/91 153/95  Pulse: 95 89  Temp: 99.2 F (37.3 C) 98 F (36.7 C)  Resp: 19 19   Filed Vitals:   09/14/15 0431 09/14/15 1433 09/14/15 2120 09/15/15 0531  BP: 151/95 150/96 121/91 153/95  Pulse: 92 92 95 89  Temp: 98.1 F (36.7 C) 98 F (36.7 C) 99.2 F (37.3 C) 98 F (36.7 C)  TempSrc: Oral Oral Oral Oral  Resp: 18 19 19 19   Height:      Weight:      SpO2: 96% 97% 97% 98%    General: Pt is alert, follows commands appropriately, not in acute distress Cardiovascular: Regular rate and rhythm, S1/S2 +, no murmurs Respiratory: Clear to auscultation bilaterally, no wheezing, no crackles, no rhonchi Abdominal: Soft, non tender, non distended, bowel sounds +, no guarding; drain in place  Extremities: no edema, no cyanosis, pulses palpable bilaterally DP and PT Neuro: Grossly nonfocal  Discharge Instructions  Discharge Instructions    Call MD for:  difficulty breathing, headache or visual disturbances    Complete by:  As directed      Call MD for:  persistant dizziness or light-headedness    Complete by:  As directed      Call MD for:  persistant nausea and vomiting    Complete by:  As directed      Call MD for:  severe uncontrolled pain    Complete by:  As directed      Diet - low sodium heart healthy    Complete by:  As directed      Discharge instructions    Complete by:  As directed   Continue Augmentin for 4 days on discharge Continue metformin 500 mg twice a day on discharge     Increase activity slowly    Complete by:  As directed             Medication List    STOP taking these medications        glucose blood test strip  Commonly known as:  ONE TOUCH ULTRA TEST      TAKE these medications        amoxicillin-clavulanate 875-125 MG tablet  Commonly known as:  AUGMENTIN  Take 1 tablet by mouth 2 (two) times daily.     metFORMIN 500 MG tablet  Commonly known as:  GLUCOPHAGE  Take 1 tablet (500 mg total) by mouth 2  (two) times daily with a meal.     multivitamin tablet  Take 1 tablet by mouth daily. Vegan Multi-Pak     oxyCODONE 5 MG immediate release tablet  Commonly known as:  Oxy IR/ROXICODONE  Take 1-2 tablets (5-10 mg total) by mouth every 4 (four) hours as needed for moderate pain.     saccharomyces boulardii 250 MG capsule  Commonly known as:  FLORASTOR  Take 1 capsule (250 mg total) by mouth 2 (two) times daily.           Follow-up Information    Follow up with Judeth Horn, MD On 09/25/2015.   Specialty:  General Surgery   Why:  Your appointment is at 2:50 PM, be at the office 30 minutes early for check in.   Contact information:   [address] [place] [address] [phone]       Follow up with Greggory Keen, MD In 1 week.   Specialty:  Interventional Radiology   Why:  pt  will hear from IR drain clinic with time and date fo follow up   Contact information:   [address] [place] [address] [phone]       Follow up with Kathlene November, MD. Schedule an appointment as soon as possible for a visit today.   Specialty:  Internal Medicine   Why:  Follow up appt after recent hospitalization   Contact information:   [place] [address] [phone]        The results of significant diagnostics from this hospitalization (including imaging, microbiology, ancillary and laboratory) are listed below for reference.    Significant Diagnostic Studies: Ct Abdomen Pelvis W Contrast  09/12/2015  CLINICAL DATA:  Diverticulitis, followup EXAM: CT ABDOMEN AND PELVIS WITH CONTRAST TECHNIQUE: Multidetector CT imaging of the abdomen and pelvis was performed using the standard protocol following bolus administration of intravenous contrast. Sagittal and coronal MPR images reconstructed from axial data set. CONTRAST:  ISOVUE-300 IOPAMIDOL (ISOVUE-300) INJECTION 61% IV. Dilute oral contrast. COMPARISON:  09/06/2015 FINDINGS: Dependent  atelectasis RIGHT lower lobe. Diffuse fatty infiltration of liver. Contracted gallbladder. Liver, spleen, pancreas, kidneys, and adrenal glands otherwise normal. Splenules adjacent to spleen. Marked wall thickening of the mid sigmoid colon, which swings RIGHT of midline in the RIGHT pelvis with note of colonic diverticula and increased surrounding pericolic inflammatory changes compatible with persistent sigmoid diverticulitis. Inflammatory changes extend into the RIGHT mid abdomen adjacent to multiple small bowel loops and laterally as far as the cecum. Two new fluid collections are seen in the RIGHT lower quadrant concerning for developing abscess collections, measuring 5.1 x 3.5 x 4.2 cm more laterally and posteriorly and 3.8 x 2.3 x 2.8 cm more anteromedially. The larger of these collections appears to abut the medial wall of the cecum. Thickening of retroperitoneal fascia planes in the mid abdomen and pelvis. No definite free intraperitoneal air or fluid. Probable thickened appendix again noted. Stomach and remaining bowel loops grossly unremarkable without definite evidence of dilatation/obstruction. LEFT inguinal hernia containing fat. Unremarkable bladder and ureters for technique. No mass, adenopathy, or acute osseous findings. IMPRESSION: Increased inflammatory changes in the RIGHT mid abdomen and RIGHT pelvis secondary to sigmoid diverticulitis other mildly redundant sigmoid colon. Two developing fluid collections are seen in the RIGHT mid abdomen concerning for developing abscesses as above. Electronically Signed   By: Lavonia Dana M.D.   On: 09/12/2015 17:15   Ct Abdomen Pelvis W Contrast  09/06/2015  CLINICAL DATA:  Right lower quadrant pain since Monday, worsening over the past 2 days. EXAM: CT ABDOMEN AND PELVIS WITH CONTRAST TECHNIQUE: Multidetector CT imaging of the abdomen and pelvis was performed using the standard protocol following bolus administration of intravenous contrast. CONTRAST:   142mL OMNIPAQUE IOHEXOL 300 MG/ML  SOLN COMPARISON:  11/17/2014 FINDINGS: The lung bases are clear.  Focal coronary artery calcifications. Prominent diffuse fatty infiltration of the liver with enlarged liver. The gallbladder, spleen, pancreas, adrenal glands, kidneys, abdominal aorta, inferior vena cava, and retroperitoneal lymph nodes are unremarkable. Stomach, small bowel, and colon are not abnormally distended. No free air or free fluid in the abdomen. Small periumbilical hernia containing fat. Pelvis: The sigmoid colon swings over to the right side of the pelvis. In the right sigmoid colon, there is focal wall thickening associated with multiple diverticula. Infiltration in the pelvic fat around the sigmoid colon and extending to the right lower quadrant. Changes are consistent with acute diverticulitis. Appearance is similar to previous  study. No abscess is identified but extraluminal gas is present adjacent to the Foley. Colonic wall thickening may be due to inflammatory process although underlying neoplasm should be excluded and evaluation the colon after resolution of acute process is suggested. There is associated inflammatory wall thickening of the adjacent terminal ileum. Scattered mesenteric lymph nodes are present. These are likely reactive. The appendix is retrocecal and is thickened since previous study, with diameter now measuring 11 mm. Reactive or coexisting appendicitis may be present. Bladder wall is not thickened. Prostate gland is not enlarged. No free or loculated pelvic fluid collections. No destructive bone lesions. IMPRESSION: Persistent or recurrent diverticulitis involving the sigmoid colon. Colonic wall thickening may be due to inflammation although on underlying colon lesion is not excluded. Edema and stranding around the sigmoid colon. No discrete abscess. There is reactive inflammatory wall thickening of the adjacent terminal ileum. The appendix is also dilated and reactive or  coexisting appendicitis is not excluded. Diffuse fatty infiltration of the liver. Electronically Signed   By: Lucienne Capers M.D.   On: 09/06/2015 19:03   Ct Image Guided Fluid Drain By Catheter  09/13/2015  INDICATION: ENLARGING RIGHT LOWER QUADRANT DIVERTICULAR ABSCESS EXAM: CT GUIDED DRAINAGE OF RIGHT LOWER QUADRANT DIVERTICULAR ABSCESS MEDICATIONS: The patient is currently admitted to the hospital and receiving intravenous antibiotics. The antibiotics were administered within an appropriate time frame prior to the initiation of the procedure. ANESTHESIA/SEDATION: 2.0 mg IV Versed 200 mcg IV Fentanyl Moderate Sedation Time:  15 minutes The patient was continuously monitored during the procedure by the interventional radiology nurse under my direct supervision. COMPLICATIONS: None immediate. TECHNIQUE: Informed written consent was obtained from the patient after a thorough discussion of the procedural risks, benefits and alternatives. All questions were addressed. Maximal Sterile Barrier Technique was utilized including caps, mask, sterile gowns, sterile gloves, sterile drape, hand hygiene and skin antiseptic. A timeout was performed prior to the initiation of the procedure. PROCEDURE: The right lower quadrant was prepped with ChloraPrep in a sterile fashion, and a sterile drape was applied covering the operative field. A sterile gown and sterile gloves were used for the procedure. Local anesthesia was provided with 1% Lidocaine. Previous imaging reviewed. Patient positioned supine. Noncontrast localization CT performed. The right lower quadrant abscess was localized. Under sterile conditions and local anesthesia, an 18 gauge 15 cm access needle was advanced percutaneously from a right anterior oblique approach. Needle position confirmed in the fluid collection with CT. Syringe aspiration yielded purulent fluid compatible with abscess. Sample sent for Gram stain and culture. Guidewire inserted followed by  tract dilatation to insert a 10 [place] drain. Retention loop formed in the abscess. Position confirmed with CT. Catheter secured with a Prolene suture and connected to external suction bulb followed by sterile dressing. No immediate complication. Patient tolerated the procedure well. FINDINGS: CT imaging confirms needle access of the right lower quadrant abscess for drain insertion. IMPRESSION: Successful CT-guided right lower quadrant abscess drain insertion. Purulent fluid aspirated. Gram stain and culture sent. Electronically Signed   By: Jerilynn Mages.  Shick M.D.   On: 09/13/2015 14:49    Microbiology: Recent Results (from the past 240 hour(s))  Culture, routine-abscess     Status: None (Preliminary result)   Collection Time: 09/13/15  2:39 PM  Result Value Ref Range Status   Specimen Description ABSCESS RIGHT ABDOMEN  Final   Special Requests Normal  Final   Gram Stain   Final    ABUNDANT WBC PRESENT,BOTH PMN AND MONONUCLEAR NO SQUAMOUS  EPITHELIAL CELLS SEEN NO ORGANISMS SEEN Performed at [facility]    Culture   Final    NO GROWTH 1 DAY Performed at [facility]    Report Status PENDING  Incomplete  Anaerobic culture     Status: None (Preliminary result)   Collection Time: 09/13/15  2:39 PM  Result Value Ref Range Status   Specimen Description ABSCESS RIGHT ABDOMEN  Final   Special Requests Normal  Final   Gram Stain   Final    ABUNDANT WBC PRESENT,BOTH PMN AND MONONUCLEAR NO SQUAMOUS EPITHELIAL CELLS SEEN NO ORGANISMS SEEN Performed at [facility]    Culture PENDING  Incomplete   Report Status PENDING  Incomplete     Labs: Basic Metabolic Panel:  Recent Labs Lab 09/10/15 0628 09/11/15 0506 09/12/15 0435 09/14/15 0445  NA 136 138 142 141  K 3.0* 3.4* 3.8 3.6  CL 103 104 106 104  CO2 25 24 28 30   GLUCOSE 193* 202* 179* 146*  BUN <5* <5* <5* <5*  CREATININE 0.69 0.65 0.69 0.69  CALCIUM 8.2* 8.3* 8.4* 8.4*   Liver Function Tests: No results for  input(s): AST, ALT, ALKPHOS, BILITOT, PROT, ALBUMIN in the last 168 hours. No results for input(s): LIPASE, AMYLASE in the last 168 hours. No results for input(s): AMMONIA in the last 168 hours. CBC:  Recent Labs Lab 09/10/15 0628 09/12/15 0435 09/14/15 0445  WBC 10.4 10.2 7.9  HGB 11.5* 11.6* 11.1*  HCT 35.5* 36.5* 35.3*  MCV 83.1 83.9 83.8  PLT 177 206 229   Cardiac Enzymes: No results for input(s): CKTOTAL, CKMB, CKMBINDEX, TROPONINI in the last 168 hours. BNP: BNP (last 3 results) No results for input(s): BNP in the last 8760 hours.  ProBNP (last 3 results) No results for input(s): PROBNP in the last 8760 hours.  CBG:  Recent Labs Lab 09/14/15 0749 09/14/15 1252 09/14/15 1654 09/14/15 2119 09/15/15 0731  GLUCAP 148* 119* 128* 199* 151*

## 2015-09-15 NOTE — Discharge Planning (Addendum)
Instructed on JP flushing and able to demonstrate. Given supplies for flushing. AVS to patient who verbalizes understanding. rx given to patient also at 1120. Dc'd to private car home with all personal belongings at 1142.

## 2015-09-16 LAB — CULTURE, ROUTINE-ABSCESS
Culture: NO GROWTH
Special Requests: NORMAL

## 2015-09-18 ENCOUNTER — Other Ambulatory Visit: Payer: Self-pay | Admitting: General Surgery

## 2015-09-18 DIAGNOSIS — K651 Peritoneal abscess: Secondary | ICD-10-CM

## 2015-09-18 LAB — ANAEROBIC CULTURE: Special Requests: NORMAL

## 2015-09-19 ENCOUNTER — Telehealth: Payer: Self-pay | Admitting: Behavioral Health

## 2015-09-19 NOTE — Telephone Encounter (Signed)
Scheduled patient for Hospital Follow up on Friday 4/7 @ 11:30

## 2015-09-19 NOTE — Telephone Encounter (Signed)
Attempted to reach patient for TCM/Hospital Follow-up. Left message for patient to return call when available.    

## 2015-09-20 ENCOUNTER — Ambulatory Visit
Admission: RE | Admit: 2015-09-20 | Discharge: 2015-09-20 | Disposition: A | Discharge status: Home / Self Care | Payer: BLUE CROSS/BLUE SHIELD | Source: Ambulatory Visit | Attending: General Surgery | Admitting: General Surgery

## 2015-09-20 ENCOUNTER — Ambulatory Visit: Payer: Self-pay | Admitting: Internal Medicine

## 2015-09-20 ENCOUNTER — Ambulatory Visit
Admission: RE | Admit: 2015-09-20 | Discharge: 2015-09-20 | Disposition: A | Discharge status: Home / Self Care | Payer: BLUE CROSS/BLUE SHIELD | Source: Ambulatory Visit | Attending: Radiology | Admitting: Radiology

## 2015-09-20 DIAGNOSIS — K651 Peritoneal abscess: Secondary | ICD-10-CM

## 2015-09-20 DIAGNOSIS — K5700 Diverticulitis of small intestine with perforation and abscess without bleeding: Secondary | ICD-10-CM | POA: Diagnosis not present

## 2015-09-20 DIAGNOSIS — K5720 Diverticulitis of large intestine with perforation and abscess without bleeding: Secondary | ICD-10-CM

## 2015-09-20 MED ORDER — IOPAMIDOL (ISOVUE-300) INJECTION 61%
125.0000 mL | Freq: Once | INTRAVENOUS | Status: AC | PRN
  Administered 2015-09-20: 125 mL via INTRAVENOUS

## 2015-09-21 NOTE — Progress Notes (Signed)
Chief Complaint: Patient was seen in consultation today for follow-up diverticular abscess and drain.  Referring Physician(s): Turpin,Pamela  History of Present Illness: Spencer Good is a 40 y.o. male with diverticular abscess and placement of pigtail drain in right abdominal abscess.  Patient notes minimal output from drain.  Describes output as pink colored.  The output is not feculent. His pain has resolved and he feels much better.  Patient has been afebrile. He is flushing drain once a day.  The drain inadvertently pulled back on 09/19/15.  The drain bulb will no longer hold suction since the drain has pulled back.  Patient has completed his oral antibiotics.    Past Medical History  Diagnosis Date  . Gout     doesn't take any meds   . ADHD (attention deficit hyperactivity disorder)     takes Vyvanse daily  . OSA (obstructive sleep apnea) dx 05-2013    Rx a Cpap 3-15, can't use   . Diverticulitis     takes Electronics engineer daily  . Hernia, umbilical [id number]    Laparoscopic umbilical hernia repair with mesh  . Insomnia     takes Sonata nightly as needed  . History of colon polyps     precancerous  . Diabetes ([place])     but doesn't take any meds  . Obesity     Past Surgical History  Procedure Laterality Date  . Colonoscopy    . Lasik Bilateral   . Umbilical hernia repair N/A 02/01/2015    Procedure: LAPAROSCOPIC UMBILICAL HERNIA REPAIR WITH MESH;  Surgeon: Rolm Bookbinder, MD;  Location: [place];  Service: General;  Laterality: N/A;  . Insertion of mesh N/A 02/01/2015    Procedure: INSERTION OF MESH;  Surgeon: Rolm Bookbinder, MD;  Location: [place];  Service: General;  Laterality: N/A;  . Hernia repair      Allergies: Review of patient's allergies indicates no known allergies.  Medications: Prior to Admission medications   Medication Sig Start Date End Date Taking? Authorizing Provider  Multiple Vitamin (MULTIVITAMIN) tablet Take 1 tablet by mouth daily. Vegan Multi-Pak    Yes Historical Provider, MD  saccharomyces boulardii (FLORASTOR) 250 MG capsule Take 1 capsule (250 mg total) by mouth 2 (two) times daily. 09/15/15  Yes Robbie Lis, MD  amoxicillin-clavulanate (AUGMENTIN) 875-125 MG tablet Take 1 tablet by mouth 2 (two) times daily. Patient not taking: Reported on 09/20/2015 09/15/15   Robbie Lis, MD  metFORMIN (GLUCOPHAGE) 500 MG tablet Take 1 tablet (500 mg total) by mouth 2 (two) times daily with a meal. Patient not taking: Reported on 09/20/2015 09/15/15   Robbie Lis, MD  oxyCODONE (OXY IR/ROXICODONE) 5 MG immediate release tablet Take 1-2 tablets (5-10 mg total) by mouth every 4 (four) hours as needed for moderate pain. Patient not taking: Reported on 09/20/2015 09/15/15   Robbie Lis, MD     Family History  Problem Relation Age of Onset  . Colon cancer Neg Hx   . Prostate cancer Neg Hx   . Diabetes Neg Hx     distant fam members  . CAD Other     MGF  . Stroke Father     F, PGF  . Heart disease Maternal Grandfather   . Colon polyps Neg Hx   . Esophageal cancer Neg Hx   . Gallbladder disease Neg Hx   . Kidney disease Neg Hx     Social History   Social History  . Marital Status:  Single    Spouse Name: N/A  . Number of Children: 0  . 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 Smoking status: Former Smoker -- 1.00 packs/day for 20 years    Types: Cigarettes    Start date: 07/30/1992    Quit date: 12/16/2014  . Smokeless tobacco: Never Used  . Alcohol Use: 0.0 oz/week    0 Standard drinks or equivalent per week     Comment: 09/08/2015 "I'll have a few drinks/year"  . Drug Use: Yes     Comment: 09/08/2015 experimented some in college; not a regular user when I did use  . Sexual Activity: Not Currently   Other Topics Concern  . Not on file   Social History Narrative     Review of Systems  Constitutional: Negative for fever and chills.  Gastrointestinal:  Negative for abdominal pain and abdominal distention.    Vital Signs: BP 145/95 mmHg  Pulse 84  Temp(Src) 98.4 F (36.9 C) (Oral)  Resp 14  SpO2 98%  Physical Exam  Abdominal: Soft. He exhibits no distension. There is no tenderness.  Drain was dislodged, only tip under skin.  No erythema around the drain.        Imaging: Ct Abdomen Pelvis W Contrast  09/20/2015  CLINICAL DATA:  40 year old with history of diverticular abscess. Follow-up drainage catheter and abscess collections. EXAM: CT ABDOMEN AND PELVIS WITH CONTRAST TECHNIQUE: Multidetector CT imaging of the abdomen and pelvis was performed using the standard protocol following bolus administration of intravenous contrast. CONTRAST:  164mL ISOVUE-300 IOPAMIDOL (ISOVUE-300) INJECTION 61% COMPARISON:  09/12/2015 and 09/06/2015 FINDINGS: Lower chest:  Lung bases are clear. Hepatobiliary: Diffuse low density of the liver is suggestive for hepatic steatosis. No biliary dilatation. Small amount of fat sparing adjacent to the gallbladder. Normal appearance of the gallbladder. The portal venous system is patent. Pancreas: Normal appearance of the pancreas without inflammation or duct dilatation. Spleen: Normal appearance of the spleen. Adrenals/Urinary Tract: Normal adrenal glands. Normal appearance of both kidneys without hydronephrosis. Urinary bladder is decompressed. Stomach/Bowel: Normal appearance of stomach and duodenum. There is no significant bowel dilatation and no evidence for bowel obstruction. Multiple diverticula involving the sigmoid colon. There continues to be mesenteric stranding in the mid and right lower abdomen. Overall, the amount of inflammation in this area has decreased. No gross abnormality to the terminal ileum. Appendix is slightly prominent without focal inflammation in this area. Vascular/Lymphatic: Few small lymph nodes in the right iliac chain may be reactive. Atherosclerotic calcifications involving the iliac arteries  and distal abdominal aorta without aneurysm. Few prominent lymph nodes in the upper abdomen are unchanged. Reproductive: Normal appearance of the prostate and seminal vesicles. Other: The right lower abdomen drainage catheter is almost completely dislodged. The tip of the catheter is just underneath the skin. There is edema or inflammatory changes in the right lower abdomen subcutaneous tissues. Previously, the patient had a bilobed abscess collection in the anterior right lower abdomen. This collection has markedly decompressed. Previously, these collections measured up to 3.5 cm in width and now measures 1.1 cm. There is minimal residual fluid in this area. Irregular low density collections along the anterior lower abdomen were present on the previous examination but slightly more defined on this examination. Largest collection in this area measures 3.0 x 1.9 cm. There is decreased fluid or edema in the upper pelvis. Left inguinal hernia containing fat.  Musculoskeletal: No suspicious bone findings. IMPRESSION: The abscess drainage catheter is almost completely dislodged. The tip is just underneath the skin surface. The bilobed or two adjacent abscess collections in the right lower abdomen has nearly resolved. There is a minimal amount of residual of fluid in this collection. There continues to be inflammatory changes, possibly phlegmon, along the anterior lower abdomen near the same level as the decompressed abscess collection. No drainable fluid collections at this time. Decreased inflammatory changes in the lower abdomen and pelvis centered around the sigmoid diverticulosis. Findings compatible with underlying diverticulitis. Hepatic steatosis. Electronically Signed   By: Markus Daft M.D.   On: 09/20/2015 13:08   Ct Abdomen Pelvis W Contrast  09/12/2015  CLINICAL DATA:  Diverticulitis, followup EXAM: CT ABDOMEN AND PELVIS WITH CONTRAST TECHNIQUE: Multidetector CT imaging of the abdomen and pelvis was performed  using the standard protocol following bolus administration of intravenous contrast. Sagittal and coronal MPR images reconstructed from axial data set. CONTRAST:  ISOVUE-300 IOPAMIDOL (ISOVUE-300) INJECTION 61% IV. Dilute oral contrast. COMPARISON:  09/06/2015 FINDINGS: Dependent atelectasis RIGHT lower lobe. Diffuse fatty infiltration of liver. Contracted gallbladder. Liver, spleen, pancreas, kidneys, and adrenal glands otherwise normal. Splenules adjacent to spleen. Marked wall thickening of the mid sigmoid colon, which swings RIGHT of midline in the RIGHT pelvis with note of colonic diverticula and increased surrounding pericolic inflammatory changes compatible with persistent sigmoid diverticulitis. Inflammatory changes extend into the RIGHT mid abdomen adjacent to multiple small bowel loops and laterally as far as the cecum. Two new fluid collections are seen in the RIGHT lower quadrant concerning for developing abscess collections, measuring 5.1 x 3.5 x 4.2 cm more laterally and posteriorly and 3.8 x 2.3 x 2.8 cm more anteromedially. The larger of these collections appears to abut the medial wall of the cecum. Thickening of retroperitoneal fascia planes in the mid abdomen and pelvis. No definite free intraperitoneal air or fluid. Probable thickened appendix again noted. Stomach and remaining bowel loops grossly unremarkable without definite evidence of dilatation/obstruction. LEFT inguinal hernia containing fat. Unremarkable bladder and ureters for technique. No mass, adenopathy, or acute osseous findings. IMPRESSION: Increased inflammatory changes in the RIGHT mid abdomen and RIGHT pelvis secondary to sigmoid diverticulitis other mildly redundant sigmoid colon. Two developing fluid collections are seen in the RIGHT mid abdomen concerning for developing abscesses as above. Electronically Signed   By: Lavonia Dana M.D.   On: 09/12/2015 17:15   Ct Abdomen Pelvis W Contrast  09/06/2015  CLINICAL DATA:  Right  lower quadrant pain since Monday, worsening over the past 2 days. EXAM: CT ABDOMEN AND PELVIS WITH CONTRAST TECHNIQUE: Multidetector CT imaging of the abdomen and pelvis was performed using the standard protocol following bolus administration of intravenous contrast. CONTRAST:  119mL OMNIPAQUE IOHEXOL 300 MG/ML  SOLN COMPARISON:  11/17/2014 FINDINGS: The lung bases are clear.  Focal coronary artery calcifications. Prominent diffuse fatty infiltration of the liver with enlarged liver. The gallbladder, spleen, pancreas, adrenal glands, kidneys, abdominal aorta, inferior vena cava, and retroperitoneal lymph nodes are unremarkable. Stomach, small bowel, and colon are not abnormally distended. No free air or free fluid in the abdomen. Small periumbilical hernia containing fat. Pelvis: The sigmoid colon swings over to the right side of the pelvis. In the right sigmoid colon, there is focal wall thickening associated with multiple diverticula. Infiltration in the pelvic fat around the sigmoid colon and extending to the right lower quadrant. Changes are consistent with acute diverticulitis. Appearance is similar to previous study. No  abscess is identified but extraluminal gas is present adjacent to the Foley. Colonic wall thickening may be due to inflammatory process although underlying neoplasm should be excluded and evaluation the colon after resolution of acute process is suggested. There is associated inflammatory wall thickening of the adjacent terminal ileum. Scattered mesenteric lymph nodes are present. These are likely reactive. The appendix is retrocecal and is thickened since previous study, with diameter now measuring 11 mm. Reactive or coexisting appendicitis may be present. Bladder wall is not thickened. Prostate gland is not enlarged. No free or loculated pelvic fluid collections. No destructive bone lesions. IMPRESSION: Persistent or recurrent diverticulitis involving the sigmoid colon. Colonic wall  thickening may be due to inflammation although on underlying colon lesion is not excluded. Edema and stranding around the sigmoid colon. No discrete abscess. There is reactive inflammatory wall thickening of the adjacent terminal ileum. The appendix is also dilated and reactive or coexisting appendicitis is not excluded. Diffuse fatty infiltration of the liver. Electronically Signed   By: Lucienne Capers M.D.   On: 09/06/2015 19:03   Ct Image Guided Fluid Drain By Catheter  09/13/2015  INDICATION: ENLARGING RIGHT LOWER QUADRANT DIVERTICULAR ABSCESS EXAM: CT GUIDED DRAINAGE OF RIGHT LOWER QUADRANT DIVERTICULAR ABSCESS MEDICATIONS: The patient is currently admitted to the hospital and receiving intravenous antibiotics. The antibiotics were administered within an appropriate time frame prior to the initiation of the procedure. ANESTHESIA/SEDATION: 2.0 mg IV Versed 200 mcg IV Fentanyl Moderate Sedation Time:  15 minutes The patient was continuously monitored during the procedure by the interventional radiology nurse under my direct supervision. COMPLICATIONS: None immediate. TECHNIQUE: Informed written consent was obtained from the patient after a thorough discussion of the procedural risks, benefits and alternatives. All questions were addressed. Maximal Sterile Barrier Technique was utilized including caps, mask, sterile gowns, sterile gloves, sterile drape, hand hygiene and skin antiseptic. A timeout was performed prior to the initiation of the procedure. PROCEDURE: The right lower quadrant was prepped with ChloraPrep in a sterile fashion, and a sterile drape was applied covering the operative field. A sterile gown and sterile gloves were used for the procedure. Local anesthesia was provided with 1% Lidocaine. Previous imaging reviewed. Patient positioned supine. Noncontrast localization CT performed. The right lower quadrant abscess was localized. Under sterile conditions and local anesthesia, an 18 gauge 15 cm  access needle was advanced percutaneously from a right anterior oblique approach. Needle position confirmed in the fluid collection with CT. Syringe aspiration yielded purulent fluid compatible with abscess. Sample sent for Gram stain and culture. Guidewire inserted followed by tract dilatation to insert a 10 [place] drain. Retention loop formed in the abscess. Position confirmed with CT. Catheter secured with a Prolene suture and connected to external suction bulb followed by sterile dressing. No immediate complication. Patient tolerated the procedure well. FINDINGS: CT imaging confirms needle access of the right lower quadrant abscess for drain insertion. IMPRESSION: Successful CT-guided right lower quadrant abscess drain insertion. Purulent fluid aspirated. Gram stain and culture sent. Electronically Signed   By: Jerilynn Mages.  Shick M.D.   On: 09/13/2015 14:49    Labs:  CBC:  Recent Labs  09/08/15 0753 09/10/15 0628 09/12/15 0435 09/14/15 0445  WBC 11.2* 10.4 10.2 7.9  HGB 12.8* 11.5* 11.6* 11.1*  HCT 38.2* 35.5* 36.5* 35.3*  PLT 125* 177 206 229    COAGS:  Recent Labs  09/07/15 0330  INR 1.19    BMP:  Recent Labs  09/10/15 [id number] 09/11/15 0506 09/12/15 0435 09/14/15 0445  NA 136 138 142 141  K 3.0* 3.4* 3.8 3.6  CL 103 104 106 104  CO2 25 24 28 30   GLUCOSE 193* 202* 179* 146*  BUN <5* <5* <5* <5*  CALCIUM 8.2* 8.3* 8.4* 8.4*  CREATININE 0.69 0.65 0.69 0.69  GFRNONAA >60 >60 >60 >60  GFRAA >60 >60 >60 >60    LIVER FUNCTION TESTS:  Recent Labs  10/24/14 0535 09/06/15 1555 09/06/15 2221 09/07/15 0330  BILITOT 1.0 4.2* 3.2* 2.1*  AST 17 98* 75* 59*  ALT 33 52 18 36  ALKPHOS 81 95 76 78  PROT 6.5 7.2 >12.0* 6.1*  ALBUMIN 3.3* 3.8 3.1* 3.2*    TUMOR MARKERS: No results for input(s): AFPTM, CEA, CA199, CHROMGRNA in the last 8760 hours.  Assessment and Plan:  The drain catheter is dislodged and completely removed today.  Fortunately, the abscess collection(s) in  right abdomen is essentially resolved.  Minimal amount of fluid is remaining in this collection and patient reported minimal output when the drain was appropriately positioned.  There are additional areas of inflammation or phlegmon in abdomen but no drainage collections at this time.  Patient still has some inflammatory changes in abdomen from diverticulitis.  No plans to place another abscess catheter at this time.  Patient is scheduled to follow up with primary doctor later this week and see surgery next week.  Will let medicine and surgery decide if they want to continue antibiotics.  Patient understands that he is at risk for developing another abscess with the drain prematurely coming out.  Fortunately, he did not have feculent drainage, so maybe there was not a fistula connection to bowel.      Thank you for this interesting consult.  I greatly enjoyed meeting CHUKWUKA DWORAK and look forward to participating in their care.  A copy of this report was sent to the requesting provider on this date.  Electronically Signed: Carylon Perches 09/21/2015, 8:56 AM   I spent a total of  10 Minutes in face to face in clinical consultation, greater than 50% of which was counseling/coordinating care for diverticular abscess.

## 2015-09-22 ENCOUNTER — Ambulatory Visit (INDEPENDENT_AMBULATORY_CARE_PROVIDER_SITE_OTHER): Payer: BLUE CROSS/BLUE SHIELD | Admitting: Internal Medicine

## 2015-09-22 ENCOUNTER — Encounter: Payer: Self-pay | Admitting: Internal Medicine

## 2015-09-22 VITALS — BP 128/78 | HR 90 | Temp 98.3°F | Ht 71.0 in | Wt 338.2 lb

## 2015-09-22 DIAGNOSIS — IMO0001 Reserved for inherently not codable concepts without codable children: Secondary | ICD-10-CM

## 2015-09-22 DIAGNOSIS — E1165 Type 2 diabetes mellitus with hyperglycemia: Secondary | ICD-10-CM | POA: Diagnosis not present

## 2015-09-22 DIAGNOSIS — K5720 Diverticulitis of large intestine with perforation and abscess without bleeding: Secondary | ICD-10-CM

## 2015-09-22 DIAGNOSIS — Z09 Encounter for follow-up examination after completed treatment for conditions other than malignant neoplasm: Secondary | ICD-10-CM

## 2015-09-22 MED ORDER — METFORMIN HCL 850 MG PO TABS: 850.0000 mg | ORAL_TABLET | Freq: Two times a day (BID) | ORAL | Status: DC

## 2015-09-22 MED ORDER — CANAGLIFLOZIN 100 MG PO TABS: 100.0000 mg | ORAL_TABLET | Freq: Every day | ORAL | Status: DC

## 2015-09-22 NOTE — Progress Notes (Signed)
Pre visit review using our clinic review tool, if applicable. No additional management support is needed unless otherwise documented below in the visit note. 

## 2015-09-22 NOTE — Patient Instructions (Signed)
  Drink plenty of fluids  Go back to healthy diet  Invokana 1 tablet daily  Metformin  850 mg twice a day (the first week take only one tablet daily)  Check your blood sugars once a day  In 2 weeks come back  for blood work only:  BMP--- dx  DM  Next visit with me in 3 months

## 2015-09-22 NOTE — Progress Notes (Signed)
Subjective:    Patient ID: Spencer Good, male    DOB: [birth year], 40 y.o.   MRN: [id number]  DOS:  09/22/2015 Type of visit - description : Hospital follow-up, no TCM Interval history: Admitted 09/06/2015 for 9 days Had acute sigmoid diverticulitis with right lower quadrant pain, [facility].R.  was consulted, they put a drain for an abscess formation, no surgery. He clinically improved and was sent home with Augmentin for 4 days. Also during this admission his blood sugar was quite elevated, A1c was 13.9, refused insulin. Most recent labs: Potassium 3.6, creatinine 0.6, hemoglobin 11.1, white count normal. LFTs were slightly elevated.  Review of Systems Since he left the hospital, took antibiotics, saw radiology, abscesses drain was removed. Will see surgery soon. No fever chills No nausea or vomiting No constipation  Did not start metformin    Past Medical History  Diagnosis Date  . Gout     doesn't take any meds   . ADHD (attention deficit hyperactivity disorder)     takes Vyvanse daily  . OSA (obstructive sleep apnea) dx 05-2013    Rx a Cpap 3-15, can't use   . Diverticulitis     takes Electronics engineer daily  . Hernia, umbilical [id number]    Laparoscopic umbilical hernia repair with mesh  . Insomnia     takes Sonata nightly as needed  . History of colon polyps     precancerous  . Diabetes ([place])     but doesn't take any meds  . Obesity     Past Surgical History  Procedure Laterality Date  . Colonoscopy    . Lasik Bilateral   . Umbilical hernia repair N/A 02/01/2015    Procedure: LAPAROSCOPIC UMBILICAL HERNIA REPAIR WITH MESH;  Surgeon: Rolm Bookbinder, MD;  Location: [place];  Service: General;  Laterality: N/A;  . Insertion of mesh N/A 02/01/2015    Procedure: INSERTION OF MESH;  Surgeon: Rolm Bookbinder, MD;  Location: [place];  Service: General;  Laterality: N/A;  . Hernia repair      Social History   Social History  . Marital Status: Single    Spouse Name: N/A  . Number of  Children: 0  . 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 Smoking status: Former Smoker -- 1.00 packs/day for 20 years    Types: Cigarettes    Start date: 07/30/1992    Quit date: 12/16/2014  . Smokeless tobacco: Never Used  . Alcohol Use: 0.0 oz/week    0 Standard drinks or equivalent per week     Comment: 09/08/2015 "I'll have a few drinks/year"  . Drug Use: Yes     Comment: 09/08/2015 experimented some in college; not a regular user when I did use  . Sexual Activity: Not Currently   Other Topics Concern  . Not on file   Social History Narrative        Medication List       This list is accurate as of: 09/22/15 11:59 PM.  Always use your most recent med list.               canagliflozin 100 MG Tabs tablet  Commonly known as:  INVOKANA  Take 1 tablet (100 mg total) by mouth daily.     metFORMIN 850 MG tablet  Commonly known as:  GLUCOPHAGE  Take 1 tablet (850 mg total) by mouth 2 (two) times daily with  a meal.     multivitamin tablet  Take 1 tablet by mouth daily. Vegan Multi-Pak     oxyCODONE 5 MG immediate release tablet  Commonly known as:  Oxy IR/ROXICODONE  Take 1-2 tablets (5-10 mg total) by mouth every 4 (four) hours as needed for moderate pain.     saccharomyces boulardii 250 MG capsule  Commonly known as:  FLORASTOR  Take 1 capsule (250 mg total) by mouth 2 (two) times daily.           Objective:   Physical Exam BP 128/78 mmHg  Pulse 90  Temp(Src) 98.3 F (36.8 C) (Oral)  Ht 5\' 11"  (1.803 m)  Wt 338 lb 4 oz (153.429 kg)  BMI 47.20 kg/m2  SpO2 97% General:   Well developed, well nourished . NAD.  HEENT:  Normocephalic . Face symmetric, atraumatic Lungs:  CTA B Normal respiratory effort, no intercostal retractions, no accessory muscle use. Heart: RRR,  no murmur.  No pretibial edema bilaterally  Skin: Not pale. Not jaundice Neurologic:  alert & oriented X3.    Speech normal, gait appropriate for age and unassisted Psych--  Cognition and judgment appear intact.  Cooperative with normal attention span and concentration.  Behavior appropriate. No anxious or depressed appearing.      Assessment & Plan:   Assessment DM-- dx 2014, took invokana, metformin, dc ~06-2014 as diet improved, a1c 13 ( 08-2015), meds restarted Gout OSA ADHD Insomnia H/o diverticulitis, sigmoid,w/ perf, conservative Rx 10-2014 , Admitted again 08/2015, abscess, s/p drainage.  Plan: DM: he did very well last year w/  diet and exercise and no  medication but the recent A1c was more than 13. Plan: Go back to healthy diet, re-start Invokana, metformin. BMP in 2 weeks. Follow-up in 3 months. Diverticulitis: Recovering well from recent prolonged admission, to see his surgeon next week. RTC 3 months

## 2015-09-23 NOTE — Assessment & Plan Note (Signed)
DM: he did very well last year w/  diet and exercise and no  medication but the recent A1c was more than 13. Plan: Go back to healthy diet, re-start Invokana, metformin. BMP in 2 weeks. Follow-up in 3 months. Diverticulitis: Recovering well from recent prolonged admission, to see his surgeon next week. RTC 3 months

## 2015-09-25 DIAGNOSIS — K5720 Diverticulitis of large intestine with perforation and abscess without bleeding: Secondary | ICD-10-CM | POA: Diagnosis not present

## 2015-09-26 ENCOUNTER — Other Ambulatory Visit: Payer: BLUE CROSS/BLUE SHIELD

## 2015-10-06 ENCOUNTER — Other Ambulatory Visit (INDEPENDENT_AMBULATORY_CARE_PROVIDER_SITE_OTHER): Payer: BLUE CROSS/BLUE SHIELD

## 2015-10-06 DIAGNOSIS — E1165 Type 2 diabetes mellitus with hyperglycemia: Secondary | ICD-10-CM

## 2015-10-06 DIAGNOSIS — IMO0001 Reserved for inherently not codable concepts without codable children: Secondary | ICD-10-CM

## 2015-10-06 LAB — BASIC METABOLIC PANEL
BUN: 16 mg/dL (ref 6–23)
CO2: 28 mEq/L (ref 19–32)
Calcium: 9.7 mg/dL (ref 8.4–10.5)
Chloride: 102 mEq/L (ref 96–112)
Creatinine, Ser: 0.79 mg/dL (ref 0.40–1.50)
GFR: 116.88 mL/min (ref >60.00)
Glucose, Bld: 195 mg/dL — ABNORMAL HIGH (ref 70–99)
Potassium: 4.5 mEq/L (ref 3.5–5.1)
Sodium: 138 mEq/L (ref 135–145)

## 2015-12-11 ENCOUNTER — Other Ambulatory Visit: Payer: Self-pay | Admitting: Internal Medicine

## 2015-12-28 ENCOUNTER — Encounter: Payer: Self-pay | Admitting: Internal Medicine

## 2015-12-28 ENCOUNTER — Ambulatory Visit (INDEPENDENT_AMBULATORY_CARE_PROVIDER_SITE_OTHER): Payer: BLUE CROSS/BLUE SHIELD | Admitting: Internal Medicine

## 2015-12-28 VITALS — BP 132/82 | HR 90 | Temp 98.0°F | Ht 72.0 in | Wt 349.5 lb

## 2015-12-28 DIAGNOSIS — K5791 Diverticulosis of intestine, part unspecified, without perforation or abscess with bleeding: Secondary | ICD-10-CM | POA: Diagnosis not present

## 2015-12-28 DIAGNOSIS — F909 Attention-deficit hyperactivity disorder, unspecified type: Secondary | ICD-10-CM | POA: Diagnosis not present

## 2015-12-28 DIAGNOSIS — Z09 Encounter for follow-up examination after completed treatment for conditions other than malignant neoplasm: Secondary | ICD-10-CM

## 2015-12-28 DIAGNOSIS — F988 Other specified behavioral and emotional disorders with onset usually occurring in childhood and adolescence: Secondary | ICD-10-CM | POA: Insufficient documentation

## 2015-12-28 DIAGNOSIS — E118 Type 2 diabetes mellitus with unspecified complications: Secondary | ICD-10-CM | POA: Diagnosis not present

## 2015-12-28 MED ORDER — METFORMIN HCL 850 MG PO TABS: 850.0000 mg | ORAL_TABLET | Freq: Three times a day (TID) | ORAL | Status: DC

## 2015-12-28 MED ORDER — CANAGLIFLOZIN 300 MG PO TABS: 300.0000 mg | ORAL_TABLET | Freq: Every day | ORAL | Status: DC

## 2015-12-28 NOTE — Progress Notes (Signed)
Subjective:    Patient ID: Spencer Good, male    DOB: [birth year], 40 y.o.   MRN: [id number]  DOS:  12/28/2015 Type of visit - description : Routine visit Interval history: DM: Good med compliance, has not improved much his diet or exercise, CBGs in the morning ~ 170s. Diverticulosis: Concern about repeated diverticulitis, elective colectomy? Request a referral to see a specialist for ADD.   Review of Systems   Past Medical History  Diagnosis Date  . Gout     doesn't take any meds   . ADHD (attention deficit hyperactivity disorder)     takes Vyvanse daily  . OSA (obstructive sleep apnea) dx 05-2013    Rx a Cpap 3-15, can't use   . Diverticulitis     takes Electronics engineer daily  . Hernia, umbilical [id number]    Laparoscopic umbilical hernia repair with mesh  . Insomnia     takes Sonata nightly as needed  . History of colon polyps     precancerous  . Diabetes ([place])     but doesn't take any meds  . Obesity     Past Surgical History  Procedure Laterality Date  . Colonoscopy    . Lasik Bilateral   . Umbilical hernia repair N/A 02/01/2015    Procedure: LAPAROSCOPIC UMBILICAL HERNIA REPAIR WITH MESH;  Surgeon: Rolm Bookbinder, MD;  Location: [place];  Service: General;  Laterality: N/A;  . Insertion of mesh N/A 02/01/2015    Procedure: INSERTION OF MESH;  Surgeon: Rolm Bookbinder, MD;  Location: [place];  Service: General;  Laterality: N/A;  . Hernia repair      Social History   Social History  . Marital Status: Single    Spouse Name: N/A  . Number of Children: 0  . 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 Smoking status: Former Smoker -- 1.00 packs/day for 20 years    Types: Cigarettes    Start date: 07/30/1992    Quit date: 12/16/2014  . Smokeless tobacco: Never Used  . Alcohol Use: 0.0 oz/week    0 Standard drinks or equivalent per week     Comment: 09/08/2015 "I'll have a few drinks/year"  . Drug  Use: Yes     Comment: 09/08/2015 experimented some in college; not a regular user when I did use  . Sexual Activity: Not Currently   Other Topics Concern  . Not on file   Social History Narrative        Medication List       This list is accurate as of: 12/28/15 12:39 PM.  Always use your most recent med list.               canagliflozin 300 MG Tabs tablet  Commonly known as:  INVOKANA  Take 1 tablet (300 mg total) by mouth daily before breakfast.     metFORMIN 850 MG tablet  Commonly known as:  GLUCOPHAGE  Take 1 tablet (850 mg total) by mouth 3 (three) times daily before meals.     multivitamin tablet  Take 1 tablet by mouth daily. Vegan Multi-Pak     oxyCODONE 5 MG immediate release tablet  Commonly known as:  Oxy IR/ROXICODONE  Take 1-2 tablets (5-10 mg total) by mouth every 4 (four) hours as needed for moderate pain.     PROBIOTIC PO  Take 1 tablet by mouth daily.  saccharomyces boulardii 250 MG capsule  Commonly known as:  FLORASTOR  Take 1 capsule (250 mg total) by mouth 2 (two) times daily.           Objective:   Physical Exam BP 132/82 mmHg  Pulse 90  Temp(Src) 98 F (36.7 C) (Oral)  Ht 6' (1.829 m)  Wt 349 lb 8 oz (158.532 kg)  BMI 47.39 kg/m2  SpO2 97% General:   Well developed, well nourished . NAD.  HEENT:  Normocephalic . Face symmetric, atraumatic Neurologic:  alert & oriented X3.  Speech normal, gait appropriate for age and unassisted Psych--  Cognition and judgment appear intact.  Cooperative with normal attention span and concentration.  Behavior appropriate. No anxious or depressed appearing.      Assessment & Plan:   Assessment DM-- dx 2014, took invokana, metformin, dc ~06-2014 as diet improved, a1c 13 ( 08-2015), meds restarted Gout OSA ADHD Insomnia H/o diverticulitis, sigmoid,w/ perf, conservative Rx 10-2014 , Admitted again 08/2015, abscess, s/p drainage.  PLAN: DM: Good med compliance, has not improved diet or  exercise, CBGs is in the 170s. Plan: Increase invokana to 300 mg, metformin 850 mg to TID, check a CMP and A1c in 2-3 weeks. Diet exercise! States with low carbohydrate diet he has very little fiber and is afraid of constipation: Recommend Metamucil, MiraLAX. ADHD: Like to restart Vyvanse, request a referral. We'll do Diverticulitis: Extremely concerned about another episode of acute diverticulitis, would like to discuss with surgery elective colectomy. Refer to Dr. Donne Hazel. At some point he states may consider a second opinion. RTC 3-4 months

## 2015-12-28 NOTE — Assessment & Plan Note (Signed)
DM: Good med compliance, has not improved diet or exercise, CBGs is in the 170s. Plan: Increase invokana to 300 mg, metformin 850 mg to TID, check a CMP and A1c in 2-3 weeks. Diet exercise! States with low carbohydrate diet he has very little fiber and is afraid of constipation: Recommend Metamucil, MiraLAX. ADHD: Like to restart Vyvanse, request a referral. We'll do Diverticulitis: Extremely concerned about another episode of acute diverticulitis, would like to discuss with surgery elective colectomy. Refer to Dr. Donne Hazel. At some point he states may consider a second opinion. RTC 3-4 months

## 2015-12-28 NOTE — Patient Instructions (Signed)
   GO TO THE FRONT DESK Schedule your next appointment for a  Check up in 3-4 months  Schedule labs to be done in 2-3 weeks: CMP A1C    Metamucil 2-3 tabs a day miralax 17 gr a day with fluids

## 2015-12-28 NOTE — Progress Notes (Signed)
Pre visit review using our clinic review tool, if applicable. No additional management support is needed unless otherwise documented below in the visit note. 

## 2016-01-01 ENCOUNTER — Telehealth: Payer: BLUE CROSS/BLUE SHIELD | Admitting: Nurse Practitioner

## 2016-01-01 ENCOUNTER — Telehealth: Payer: Self-pay | Admitting: Internal Medicine

## 2016-01-01 DIAGNOSIS — R197 Diarrhea, unspecified: Secondary | ICD-10-CM

## 2016-01-01 NOTE — Progress Notes (Signed)
We are sorry that you are not feeling well.  Here is how we plan to help!  Based on what you have shared with me it looks like you have Acute Infectious Diarrhea.  Most cases of acute diarrhea are due to infections with virus and bacteria and are self-limited conditions lasting less than 14 days.  For your symptoms you may take Imodium 2 mg tablets that are over the counter at your local pharmacy. Take two tablet now and then one after each loose stool up to 6 a day.  Antibiotics are not needed for most people with diarrhe.  HOME CARE  We recommend changing your diet to help with your symptoms for the next few days.  Drink plenty of fluids that contain water salt and sugar. Sports drinks such as Gatorade may help.   You may try broths, soups, bananas, applesauce, soft breads, mashed potatoes or crackers.   You are considered infectious for as long as the diarrhea continues. Hand washing or use of alcohol based hand sanitizers is recommend.  It is best to stay out of work or school until your symptoms stop.   GET HELP RIGHT AWAY  If you have dark yellow colored urine or do not pass urine frequently you should drink more fluids.    If your symptoms worsen   If you feel like you are going to pass out (faint)  You have a new problem  MAKE SURE YOU   Understand these instructions.  Will watch your condition.  Will get help right away if you are not doing well or get worse.  Your e-visit answers were reviewed by a board certified advanced clinical practitioner to complete your personal care plan.  Depending on the condition, your plan could have included both over the counter or prescription medications.  If there is a problem please reply  once you have received a response from your provider.  Your safety is important to [place].  If you have drug allergies check your prescription carefully.    You can use MyChart to ask questions about today's visit, request a non-urgent call  back, or ask for a work or school excuse for 24 hours related to this e-Visit. If it has been greater than 24 hours you will need to follow up with your provider, or enter a new e-Visit to address those concerns.   You will get an e-mail in the next two days asking about your experience.  I hope that your e-visit has been valuable and will speed your recovery. Thank you for using e-visits.

## 2016-01-01 NOTE — Telephone Encounter (Signed)
Spoke w/ Pt, informed him of recommendations. Acute visit scheduled for 01/02/2016 at 0800 w/ Einar Pheasant, okay per Ivin Booty.

## 2016-01-01 NOTE — Telephone Encounter (Signed)
ER if severe symptoms, fever or chills ; otherwise needs to be seen. No antibiotics at this point.

## 2016-01-01 NOTE — Telephone Encounter (Signed)
LMOM informing Pt to return call.  

## 2016-01-01 NOTE — Telephone Encounter (Signed)
°  Relationship to patient: Self Can be reached: [phone]   Reason for call: Patient called with questions about his diverticulitis. Request a call back to discuss.

## 2016-01-01 NOTE — Telephone Encounter (Signed)
Spoke w/ Spencer Good, he has been having diarrhea for 1 week, some abdominal discomfort, denies rectal bleeding, blood in stools, fever, N/V. His stomach feels "bubbly, and gasey." He has not tried any OTC medication because he is afraid of leading to constipation and diverticulitis flare. He is wondering if he should be on antibiotic to keep him from having flare. Informed him I would let Dr. Larose Kells know of his Sx's and call back before I left today. Spencer Good verbalized understanding.

## 2016-01-01 NOTE — Telephone Encounter (Signed)
Patient returning call. Request call back before the end of the day if possible.

## 2016-01-02 ENCOUNTER — Ambulatory Visit (INDEPENDENT_AMBULATORY_CARE_PROVIDER_SITE_OTHER): Payer: BLUE CROSS/BLUE SHIELD | Admitting: Physician Assistant

## 2016-01-02 ENCOUNTER — Encounter: Payer: Self-pay | Admitting: Physician Assistant

## 2016-01-02 VITALS — BP 130/92 | HR 94 | Temp 98.3°F | Resp 18 | Ht 71.0 in | Wt 346.2 lb

## 2016-01-02 DIAGNOSIS — R195 Other fecal abnormalities: Secondary | ICD-10-CM

## 2016-01-02 LAB — CBC WITH DIFFERENTIAL/PLATELET
Basophils Absolute: 0 10*3/uL (ref 0.0–0.1)
Basophils Relative: 0.4 % (ref 0.0–3.0)
Eosinophils Absolute: 0.2 10*3/uL (ref 0.0–0.7)
Eosinophils Relative: 2.5 % (ref 0.0–5.0)
HCT: 43 % (ref 39.0–52.0)
Hemoglobin: 14.4 g/dL (ref 13.0–17.0)
Lymphocytes Relative: 31.9 % (ref 12.0–46.0)
Lymphs Abs: 3.1 10*3/uL (ref 0.7–4.0)
MCHC: 33.6 g/dL (ref 30.0–36.0)
MCV: 80.2 fl (ref 78.0–100.0)
Monocytes Absolute: 0.6 10*3/uL (ref 0.1–1.0)
Monocytes Relative: 5.9 % (ref 3.0–12.0)
Neutro Abs: 5.8 10*3/uL (ref 1.4–7.7)
Neutrophils Relative %: 59.3 % (ref 43.0–77.0)
Platelets: 258 10*3/uL (ref 150.0–400.0)
RBC: 5.37 Mil/uL (ref 4.22–5.81)
RDW: 15.1 % (ref 11.5–15.5)
WBC: 9.7 10*3/uL (ref 4.0–10.5)

## 2016-01-02 NOTE — Progress Notes (Signed)
Pre visit review using our clinic review tool, if applicable. No additional management support is needed unless otherwise documented below in the visit note/SLS  

## 2016-01-02 NOTE — Patient Instructions (Signed)
Please go to the lab for blood work. I will call with your results.  Stay well hydrated and rest when possible. Continue your Probiotic.  You can use an Immodium OTC if you develop significant diarrhea.  I do believe you have a mild gastroenteritis and this should resolve over the next few days.   If your labs are abnormal we will alter your regimen.  If you notice any fever, focal abdominal pain, or true diarrhea please follow-up ASAP or go to the ER.  I do not see signs of an acute diverticulitis today.

## 2016-01-02 NOTE — Progress Notes (Signed)
Patient with history of acute diverticulitis presents to clinic today c/o 6 days of loose stools. Endorses averaging 5-8 loose stools per day. Endorses fecal urgency with loose stools but small volume. Is now on a regular diet. Was recently at beach with significant change in diet and was holding his usual fiber supplements. States the fiber supplements keep stool solid. Patient denies nausea or vomiting, fever or chills. Does note intermittent and generalized abdominal cramping, lasting maybe 1 minute each and typically relieved with passing gas.   Past Medical History  Diagnosis Date  . Gout     doesn't take any meds   . ADHD (attention deficit hyperactivity disorder)     takes Vyvanse daily  . OSA (obstructive sleep apnea) dx 05-2013    Rx a Cpap 3-15, can't use   . Diverticulitis     takes Electronics engineer daily  . Hernia, umbilical [id number]    Laparoscopic umbilical hernia repair with mesh  . Insomnia     takes Sonata nightly as needed  . History of colon polyps     precancerous  . Diabetes ([place])     but doesn't take any meds  . Obesity     Current Outpatient Prescriptions on File Prior to Visit  Medication Sig Dispense Refill  . canagliflozin (INVOKANA) 300 MG TABS tablet Take 1 tablet (300 mg total) by mouth daily before breakfast. 30 tablet 5  . metFORMIN (GLUCOPHAGE) 850 MG tablet Take 1 tablet (850 mg total) by mouth 3 (three) times daily before meals. 90 tablet 6  . Multiple Vitamin (MULTIVITAMIN) tablet Take 1 tablet by mouth daily. Vegan Multi-Pak    . Probiotic Product (PROBIOTIC PO) Take 1 tablet by mouth daily. Taking [2] two OTC Probiotics     No current facility-administered medications on file prior to visit.    No Known Allergies  Family History  Problem Relation Age of Onset  . Colon cancer Neg Hx   . Prostate cancer Neg Hx   . Diabetes Neg Hx     distant fam members  . CAD Other     MGF  . Stroke Father     F, PGF  . Heart disease Maternal Grandfather   .  Colon polyps Neg Hx   . Esophageal cancer Neg Hx   . Gallbladder disease Neg Hx   . Kidney disease Neg Hx     Social History   Social History  . Marital Status: Single    Spouse Name: N/A  . Number of Children: 0  . 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 Smoking status: Former Smoker -- 1.00 packs/day for 20 years    Types: Cigarettes    Start date: 07/30/1992    Quit date: 12/16/2014  . Smokeless tobacco: Never Used  . Alcohol Use: 0.0 oz/week    0 Standard drinks or equivalent per week     Comment: 09/08/2015 "I'll have a few drinks/year"  . Drug Use: Yes     Comment: 09/08/2015 experimented some in college; not a regular user when I did use  . Sexual Activity: Not Currently   Other Topics Concern  . None   Social History Narrative   Review of Systems - See HPI.  All other ROS are negative.  BP 130/92 mmHg  Pulse 94  Temp(Src) 98.3 F (36.8 C) (Oral)  Resp 18  Ht 5\' 11"  (1.803 m)  Wt 346 lb 4 oz (157.058 kg)  BMI 48.31 kg/m2  SpO2 98%  Physical Exam  Constitutional: He is oriented to person, place, and time and well-developed, well-nourished, and in no distress.  HENT:  Head: Normocephalic and atraumatic.  Eyes: Conjunctivae are normal.  Cardiovascular: Normal rate, regular rhythm, normal heart sounds and intact distal pulses.   Pulmonary/Chest: Effort normal and breath sounds normal. No respiratory distress. He has no wheezes. He has no rales. He exhibits no tenderness.  Abdominal: Soft. Bowel sounds are normal. He exhibits no distension and no mass. There is no tenderness. There is no rebound and no guarding.  Neurological: He is alert and oriented to person, place, and time.  Skin: Skin is warm and dry. No rash noted.  Psychiatric: Affect normal.  Vitals reviewed.   Recent Results (from the past 2160 hour(s))  Basic metabolic panel     Status: Abnormal   Collection Time:  10/06/15  9:58 AM  Result Value Ref Range   Sodium 138 135 - 145 mEq/L   Potassium 4.5 3.5 - 5.1 mEq/L   Chloride 102 96 - 112 mEq/L   CO2 28 19 - 32 mEq/L   Glucose, Bld 195 (H) 70 - 99 mg/dL   BUN 16 6 - 23 mg/dL   Creatinine, Ser 0.79 0.40 - 1.50 mg/dL   Calcium 9.7 8.4 - 10.5 mg/dL   GFR 116.88 >60.00 mL/min   Assessment/Plan: 1. Loose stools Potentially viral gastroenteritis versus sensitive bowel from change in diet and medication regimen. No fever, chills, alarm symptoms. No abdominal tenderness noted on examination. Will check CBC and stool culture today. Do not feel this is consistent with diverticulitis. As such, do not feel ABX are indicated at present. Supportive measures reviewed. He will start BRAT diet and restart his probiotic and fiber supplement. Discussed return precautions. If labs indicate, will start ABX. - CBC w/Diff - Stool Culture   Leeanne Rio, PA-C

## 2016-01-06 LAB — STOOL CULTURE

## 2016-01-16 ENCOUNTER — Other Ambulatory Visit (INDEPENDENT_AMBULATORY_CARE_PROVIDER_SITE_OTHER): Payer: BLUE CROSS/BLUE SHIELD

## 2016-01-16 DIAGNOSIS — E118 Type 2 diabetes mellitus with unspecified complications: Secondary | ICD-10-CM

## 2016-01-16 LAB — COMPREHENSIVE METABOLIC PANEL
ALT: 53 U/L (ref 0–53)
AST: 27 U/L (ref 0–37)
Albumin: 4.5 g/dL (ref 3.5–5.2)
Alkaline Phosphatase: 84 U/L (ref 39–117)
BUN: 15 mg/dL (ref 6–23)
CO2: 26 mEq/L (ref 19–32)
Calcium: 10.2 mg/dL (ref 8.4–10.5)
Chloride: 101 mEq/L (ref 96–112)
Creatinine, Ser: 1 mg/dL (ref 0.40–1.50)
GFR: 88.91 mL/min (ref >60.00)
Glucose, Bld: 163 mg/dL — ABNORMAL HIGH (ref 70–99)
Potassium: 4.1 mEq/L (ref 3.5–5.1)
Sodium: 137 mEq/L (ref 135–145)
Total Bilirubin: 0.4 mg/dL (ref 0.2–1.2)
Total Protein: 8 g/dL (ref 6.0–8.3)

## 2016-01-16 LAB — HEMOGLOBIN A1C: Hgb A1c MFr Bld: 8.5 % — ABNORMAL HIGH (ref 4.6–6.5)

## 2016-01-17 ENCOUNTER — Other Ambulatory Visit: Payer: BLUE CROSS/BLUE SHIELD

## 2016-01-22 DIAGNOSIS — K5720 Diverticulitis of large intestine with perforation and abscess without bleeding: Secondary | ICD-10-CM | POA: Diagnosis not present

## 2016-03-26 DIAGNOSIS — R41840 Attention and concentration deficit: Secondary | ICD-10-CM | POA: Diagnosis not present

## 2016-03-26 DIAGNOSIS — F419 Anxiety disorder, unspecified: Secondary | ICD-10-CM | POA: Diagnosis not present

## 2016-03-26 DIAGNOSIS — F902 Attention-deficit hyperactivity disorder, combined type: Secondary | ICD-10-CM | POA: Diagnosis not present

## 2016-03-26 DIAGNOSIS — Z79899 Other long term (current) drug therapy: Secondary | ICD-10-CM | POA: Diagnosis not present

## 2016-03-27 ENCOUNTER — Encounter: Payer: Self-pay | Admitting: Internal Medicine

## 2016-03-27 ENCOUNTER — Ambulatory Visit (INDEPENDENT_AMBULATORY_CARE_PROVIDER_SITE_OTHER): Payer: BLUE CROSS/BLUE SHIELD | Admitting: Internal Medicine

## 2016-03-27 VITALS — BP 140/100 | HR 84 | Ht 72.0 in | Wt 357.2 lb

## 2016-03-27 DIAGNOSIS — K5732 Diverticulitis of large intestine without perforation or abscess without bleeding: Secondary | ICD-10-CM | POA: Diagnosis not present

## 2016-03-27 DIAGNOSIS — D120 Benign neoplasm of cecum: Secondary | ICD-10-CM | POA: Diagnosis not present

## 2016-03-27 DIAGNOSIS — R935 Abnormal findings on diagnostic imaging of other abdominal regions, including retroperitoneum: Secondary | ICD-10-CM

## 2016-03-27 NOTE — Patient Instructions (Signed)
You have been scheduled with Dr. Excell Seltzer at [facility] on 04/10/2016 at 4:00pm.  Please arrive at 3:30pm.  If you need to reschedule the phone number is [phone]

## 2016-03-27 NOTE — Progress Notes (Signed)
HISTORY OF PRESENT ILLNESS:  Spencer Good is a 40 y.o. male with morbid obesity and recurrent diverticulitis with perforation who is referred by Gen. surgery, Dr. Judeth Horn, regarding these issues and the possible need for colonoscopy. The patient first developed contained perforated sigmoid diverticulitis in May 2016. He was hospitalized and treated conservatively and responds did. He was referred to this office by Dr. Donne Hazel and subsequently underwent complete colonoscopy 01/02/2015. The examination was complete and the preparation excellent. A diminutive cecal adenoma was removed. There was moderate diverticulosis involving the left colon. There were no other mucosal abnormalities. The patient did well until March of this year when he presented to the hospital with right lower quadrant pain. He was again found to have sigmoid diverticulitis. The question of appendicitis was raised. Subsequent imaging suggested enlarging fluid collections in the right lower quadrant secondary to sigmoid diverticulitis. He was treated with antibiotics and percutaneous drainage with resolution of the process. He saw Dr. Hulen Skains as an outpatient 01/22/2016. I have reviewed that note. Question was complicated sigmoid diverticulitis versus appendicitis. Questionable need for repeat colonoscopy. If surgery was to be planned, recommendation was with Dr. Richarda Blade colleagues who perform laparoscopic colectomies. Patient has had no problems with abdominal complaints in recent months. He is extremely anxious. His biggest concern is complicated diverticulitis leading to possible colostomy. He continues to gain weight. No other complaints  REVIEW OF SYSTEMS:  All non-GI ROS negative except for sinus and allergy trouble, fatigue  Past Medical History:  Diagnosis Date  . ADHD (attention deficit hyperactivity disorder)    takes Vyvanse daily  . Diabetes ([place])    but doesn't take any meds  . Diverticulitis    takes Electronics engineer daily   . Diverticulitis of colon with perforation   . Gout    doesn't take any meds   . Hernia, umbilical [id number]   Laparoscopic umbilical hernia repair with mesh  . History of colon polyps    precancerous  . Insomnia    takes Sonata nightly as needed  . Obesity   . OSA (obstructive sleep apnea) dx 05-2013   Rx a Cpap 3-15, can't use     Past Surgical History:  Procedure Laterality Date  . COLONOSCOPY    . HERNIA REPAIR    . INSERTION OF MESH N/A 02/01/2015   Procedure: INSERTION OF MESH;  Surgeon: Rolm Bookbinder, MD;  Location: [place];  Service: General;  Laterality: N/A;  . LASIK Bilateral   . UMBILICAL HERNIA REPAIR N/A 02/01/2015   Procedure: LAPAROSCOPIC UMBILICAL HERNIA REPAIR WITH MESH;  Surgeon: Rolm Bookbinder, MD;  Location: [place];  Service: General;  Laterality: N/A;    Social History Spencer Good  reports that he quit smoking about 15 months ago. His smoking use included Cigarettes. He started smoking about 23 years ago. He has a 20.00 pack-year smoking history. He has never used smokeless tobacco. He reports that he drinks alcohol. He reports that he uses drugs.  family history includes CAD in his other; Heart disease in his maternal grandfather; Stroke in his father.  No Known Allergies     PHYSICAL EXAMINATION: Vital signs: BP (!) 140/100 (BP Location: Left Arm, Patient Position: Sitting, Cuff Size: Normal)   Pulse 84   Ht 6' (1.829 m)   Wt (!) 357 lb 3.2 oz (162 kg)   BMI 48.45 kg/m   Constitutional: Anxious, obese, otherwise generally well-appearing, no acute distress Psychiatric: alert and oriented x3, cooperative Eyes: extraocular movements intact, anicteric,  conjunctiva pink Mouth: oral pharynx moist, no lesions Neck: supple no lymphadenopathy Cardiovascular: heart regular rate and rhythm, no murmur Lungs: clear to auscultation bilaterally Abdomen: soft, obese, nontender, nondistended, no obvious ascites, no peritoneal signs, normal bowel sounds, no  organomegaly Rectal: Omitted Extremities: no clubbing cyanosis or lower extremity edema bilaterally Skin: no lesions on visible extremities Neuro: No focal deficits. Cranial nerves intact  ASSESSMENT:  #60. 40 year old with clearly documented contained perforated diverticulitis of the sigmoid colon 2016. Subsequent colonoscopy 2 months later revealing significant left-sided diverticulosis and incidental diminutive polyp only. Re-presentation most consistent with perforated sigmoid diverticulitis with subsequent right lower quadrant fluid collection. Cannot rule out appendicitis but suspect that the inflammation in the region of the appendix was secondary. I almost think that the point is moot. This gentleman, would be most reasonable for him to undergo laparoscopic left hemicolectomy with concurrent appendectomy. I do not think he needs repeat colonoscopy. As Dr. Hulen Skains recommended one of his partners if surgery was felt to be needed, I would like him to see Dr. Excell Seltzer.  A copy of this consultation note has been sent to Dr. Hulen Skains, Dr. Larose Kells, and Dr. Excell Seltzer

## 2016-04-10 DIAGNOSIS — K5720 Diverticulitis of large intestine with perforation and abscess without bleeding: Secondary | ICD-10-CM | POA: Diagnosis not present

## 2016-04-12 ENCOUNTER — Ambulatory Visit: Payer: Self-pay | Admitting: General Surgery

## 2016-04-24 ENCOUNTER — Ambulatory Visit (INDEPENDENT_AMBULATORY_CARE_PROVIDER_SITE_OTHER): Payer: BLUE CROSS/BLUE SHIELD | Admitting: Internal Medicine

## 2016-04-24 ENCOUNTER — Encounter: Payer: Self-pay | Admitting: Internal Medicine

## 2016-04-24 VITALS — BP 126/74 | HR 99 | Temp 98.2°F | Resp 14 | Ht 72.0 in | Wt 354.1 lb

## 2016-04-24 DIAGNOSIS — Z23 Encounter for immunization: Secondary | ICD-10-CM | POA: Diagnosis not present

## 2016-04-24 DIAGNOSIS — E1169 Type 2 diabetes mellitus with other specified complication: Secondary | ICD-10-CM | POA: Diagnosis not present

## 2016-04-24 DIAGNOSIS — E1165 Type 2 diabetes mellitus with hyperglycemia: Secondary | ICD-10-CM | POA: Diagnosis not present

## 2016-04-24 DIAGNOSIS — IMO0002 Reserved for concepts with insufficient information to code with codable children: Secondary | ICD-10-CM

## 2016-04-24 LAB — LDL CHOLESTEROL, DIRECT: Direct LDL: 104 mg/dL

## 2016-04-24 LAB — LIPID PANEL
Cholesterol: 200 mg/dL (ref 0–200)
HDL: 28.8 mg/dL — ABNORMAL LOW (ref >39.00)
Total CHOL/HDL Ratio: 7
Triglycerides: 551 mg/dL — ABNORMAL HIGH (ref 0.0–149.0)

## 2016-04-24 LAB — MICROALBUMIN / CREATININE URINE RATIO
Creatinine,U: 89.7 mg/dL
Microalb Creat Ratio: 0.8 mg/g (ref 0.0–30.0)
Microalb, Ur: <0.7 mg/dL (ref 0.0–1.9)

## 2016-04-24 LAB — BASIC METABOLIC PANEL
BUN: 16 mg/dL (ref 6–23)
CO2: 28 mEq/L (ref 19–32)
Calcium: 9.4 mg/dL (ref 8.4–10.5)
Chloride: 102 mEq/L (ref 96–112)
Creatinine, Ser: 1.08 mg/dL (ref 0.40–1.50)
GFR: 81.24 mL/min (ref >60.00)
Glucose, Bld: 134 mg/dL — ABNORMAL HIGH (ref 70–99)
Potassium: 4.7 mEq/L (ref 3.5–5.1)
Sodium: 136 mEq/L (ref 135–145)

## 2016-04-24 LAB — HEMOGLOBIN A1C: Hgb A1c MFr Bld: 7.4 % — ABNORMAL HIGH (ref 4.6–6.5)

## 2016-04-24 NOTE — Progress Notes (Signed)
Pre visit review using our clinic review tool, if applicable. No additional management support is needed unless otherwise documented below in the visit note. 

## 2016-04-24 NOTE — Progress Notes (Signed)
Subjective:    Patient ID: Spencer Good, male    DOB: [birth year], 40 y.o.   MRN: [id number]  DOS:  04/24/2016 Type of visit - description : rov Interval history: Diverticulitis: To have a elective colectomy in few weeks. appropriately ously anxious about it. DM: Good med compliance, diet slightly better lately, not exercising. Gout: No recent episodes   Review of Systems Denies lower extremity paresthesias  Past Medical History:  Diagnosis Date  . ADHD (attention deficit hyperactivity disorder)    takes Vyvanse daily  . Diabetes ([place])    but doesn't take any meds  . Diverticulitis    takes Electronics engineer daily  . Diverticulitis of colon with perforation   . Gout    doesn't take any meds   . Hernia, umbilical [id number]   Laparoscopic umbilical hernia repair with mesh  . History of colon polyps    precancerous  . Insomnia    takes [place] nightly as needed  . Obesity   . OSA (obstructive sleep apnea) dx 05-2013   Rx a Cpap 3-15, can't use     Past Surgical History:  Procedure Laterality Date  . COLONOSCOPY    . HERNIA REPAIR    . INSERTION OF MESH N/A 02/01/2015   Procedure: INSERTION OF MESH;  Surgeon: Rolm Bookbinder, MD;  Location: [facility];  Service: General;  Laterality: N/A;  . LASIK Bilateral   . UMBILICAL HERNIA REPAIR N/A 02/01/2015   Procedure: LAPAROSCOPIC UMBILICAL HERNIA REPAIR WITH MESH;  Surgeon: Rolm Bookbinder, MD;  Location: [facility];  Service: General;  Laterality: N/A;    Social History   Social History  . Marital status: Single    Spouse name: N/A  . Number of children: 0  . Years of education: N/A   Occupational History  . [employer] History Main Topics  . Smoking status: Former Smoker    Packs/day: 1.00    Years: 20.00    Types: Cigarettes    Start date: 07/30/1992    Quit date: 12/16/2014  . Smokeless tobacco: Never Used     Comment: on/off  . Alcohol use 0.0 oz/week     Comment: 09/08/2015  "I'll have a few drinks/year"  . Drug use:      Comment: 09/08/2015 experimented some in college; not a regular user when I did use  . Sexual activity: Not Currently   Other Topics Concern  . Not on file   Social History Narrative  . No narrative on file        Medication List       Accurate as of 04/24/16  8:48 AM. Always use your most recent med list.          canagliflozin 300 MG Tabs tablet Commonly known as:  INVOKANA Take 1 tablet (300 mg total) by mouth daily before breakfast.   lisdexamfetamine 30 MG capsule Commonly known as:  VYVANSE Take 30 mg by mouth daily.   metFORMIN 850 MG tablet Commonly known as:  GLUCOPHAGE Take 1 tablet (850 mg total) by mouth 3 (three) times daily before meals.   multivitamin tablet Take 1 tablet by mouth daily. Vegan Multi-Pak   PROBIOTIC PO Take 1 tablet by mouth daily. Taking [2] two OTC Probiotics   psyllium 58.6 % powder Commonly known as:  METAMUCIL Take 2 packets by mouth daily. Reported on 01/02/2016          Objective:   Physical Exam BP  126/74 (BP Location: Left Arm, Patient Position: Sitting, Cuff Size: Normal)   Pulse 99   Temp 98.2 F (36.8 C) (Oral)   Resp 14   Ht 6' (1.829 m)   Wt (!) 354 lb 2 oz (160.6 kg)   SpO2 97%   BMI 48.03 kg/m  General:   Well developed, morbidly obese gentleman. No distress HEENT:  Normocephalic . Face symmetric, atraumatic Lungs:  CTA B Normal respiratory effort, no intercostal retractions, no accessory muscle use. Heart: RRR,  no murmur.  No pretibial edema bilaterally  Skin: Not pale. Not jaundice Neurologic:  alert & oriented X3.  Speech normal, gait appropriate for age and unassisted Psych--  Cognition and judgment appear intact.  Cooperative with normal attention span and concentration.  Behavior appropriate. No anxious or depressed appearing.      Assessment & Plan:    Assessment DM-- dx 2014, took invokana, metformin, dc ~06-2014 as diet improved, a1c  13 ( 08-2015), meds restarted Gout Morbid obesity OSA ADHD Insomnia H/o diverticulitis, sigmoid,w/ perf, conservative Rx 10-2014 , Admitted again 08/2015, abscess, s/p drainage.  PLAN: DM: Last A1c improving after he restarted meds. Currently on  invokana to 300 mg, metformin 850 mg to TID. Diet has improved a little, not exercising much. Will get a A1c, microalbumin, FLP and BMP. Extensive discussion about diet. States is going to the better after elective surgery. Self-education encourage Diverticulitis: To have surgery elective in one month Morbid obesity: Diet and exercise discussed Primary care: Flu shot today, Prevnar today, needs to see the eye doctor, start aspirin 81 after surgery for CV protection. RTC 3 months.

## 2016-04-24 NOTE — Patient Instructions (Signed)
GO TO THE LAB : Get the blood work     GO TO THE FRONT DESK Schedule your next appointment for a  routine checkup in 3 months   See the eye doctor regularly for diabetes, at least once a year  Read a book  DIABETES FOR DUMMIES

## 2016-04-25 NOTE — Assessment & Plan Note (Signed)
DM: Last A1c improving after he restarted meds. Currently on  invokana to 300 mg, metformin 850 mg to TID. Diet has improved a little, not exercising much. Will get a A1c, microalbumin, FLP and BMP. Extensive discussion about diet. States is going to the better after elective surgery. Self-education encourage Diverticulitis: To have surgery elective in one month Morbid obesity: Diet and exercise discussed Primary care: Flu shot today, Prevnar today, needs to see the eye doctor, start aspirin 81 after surgery for CV protection. RTC 3 months.

## 2016-05-01 DIAGNOSIS — G4733 Obstructive sleep apnea (adult) (pediatric): Secondary | ICD-10-CM | POA: Diagnosis not present

## 2016-05-01 DIAGNOSIS — Z79899 Other long term (current) drug therapy: Secondary | ICD-10-CM | POA: Diagnosis not present

## 2016-05-01 DIAGNOSIS — F419 Anxiety disorder, unspecified: Secondary | ICD-10-CM | POA: Diagnosis not present

## 2016-05-20 NOTE — Patient Instructions (Addendum)
Spencer Good  05/20/2016   Your procedure is scheduled on: 05-24-16  Report to [facility] Main  Entrance take [facility]  elevators to 3rd floor to  [place] at   [place] AM.  Call this number if you have problems the morning of surgery [phone]   Follow Bowel Prep per MD instructions- drink Clear Liquids plentiful day of prep. COLON BOWEL PREP Please follow the instructions carefully. It is important to clean out your bowels & take the prescribed antibiotic pills to lower your chances of a wound infection or abscess.   FIVE DAYS PRIOR TO YOUR SURGERY Stop eating any nuts, popcorn, or fruit with seeds. Stop all fiber supplements such as Metamucil, Citrucel, etc.   Hold taking any blood thinning anticoagulation medication (ex: aspirin, warfarin/Coumadin, Plavix, Xarelto, Eliquis, Pradaxa, etc) as recommended by your medical/cardiology doctor  Obtain what you need at a pharmacy of your choice: -Filled out prescriptions for your oral antibiotics (Neomycin & Metronidazole)  -A bottle of MiraLax / Glycolax (288g) - no prescription required  -A large bottle of Gatorade / Powerade (64oz)  -Dulcolax tablets (4 tabs) - no prescription required   DAY PRIOR TO SURGERY   7:00am Swallow 4 Dulcolax tablets with some water Drink plenty of clear liquids all day to avoid getting dehydrated (Water, juice, soda, coffee, tea, bouillon, jello, etc.)  10:00am Mix the bottle of MiraLax with the 64-oz bottle of Gatorade.  Drink the Gatorade mixture gradually over the next few hours (8oz glass every 15-30 minutes) until gone. You should finish by 2pm.  2:00pm Take 2 Neomycin 500mg  tablets & 2 Metronidazole 500mg  tablets  3:00pm Take 2 Neomycin 500mg  tablets & 2 Metronidazole 500mg  tablets  Drink plenty of clear liquids all evening to avoid getting dehydrated  10:00pm Take 2 Neomycin 500mg  tablets & 2 Metronidazole 500mg  tablets  Do not eat or drink anything after  bedtime (midnight) the night before your surgery.   MORNING OF SURGERY Remember to not to drink or eat anything that morning  Hold or take medications as recommended by the hospital staff at your Preoperative visit  If you have questions or concerns, please call [facility] [phone] during business hours to speak to the clinical staff for advice.   Remember: ONLY 1 PERSON MAY GO WITH YOU TO SHORT STAY TO GET  READY MORNING OF YOUR SURGERY.  Do not eat food or drink liquids :After Midnight.     Take these medicines the morning of surgery with A SIP OF WATER: None. DO NOT TAKE ANY DIABETIC MEDICATIONS DAY OF YOUR SURGERY                               You may not have any metal on your body including hair pins and              piercings  Do not wear jewelry, make-up, lotions, powders or perfumes, deodorant             Do not wear nail polish.  Do not shave  48 hours prior to surgery.              Men may shave face and neck.   Do not bring valuables to the hospital. [facility] IS NOT  RESPONSIBLE   FOR VALUABLES.  Contacts, dentures or bridgework may not be worn into surgery.  Leave suitcase in the car. After surgery it may be brought to your room.     Patients discharged the day of surgery will not be allowed to drive home.  Name and phone number of your driver: Spencer Good -mother [phone] cell  Special Instructions: N/A              Please read over the following fact sheets you were given: _____________________________________________________________________             [facility] - Preparing for Surgery Before surgery, you can play an important role.  Because skin is not sterile, your skin needs to be as free of germs as possible.  You can reduce the number of germs on your skin by washing with CHG (chlorahexidine gluconate) soap before surgery.  CHG is an antiseptic cleaner which kills germs and bonds with the skin to continue killing  germs even after washing. Please DO NOT use if you have an allergy to CHG or antibacterial soaps.  If your skin becomes reddened/irritated stop using the CHG and inform your nurse when you arrive at Short Stay. Do not shave (including legs and underarms) for at least 48 hours prior to the first CHG shower.  You may shave your face/neck. Please follow these instructions carefully:  1.  Shower with CHG Soap the night before surgery and the  morning of Surgery.  2.  If you choose to wash your hair, wash your hair first as usual with your  normal  shampoo.  3.  After you shampoo, rinse your hair and body thoroughly to remove the  shampoo.                           4.  Use CHG as you would any other liquid soap.  You can apply chg directly  to the skin and wash                       Gently with a scrungie or clean washcloth.  5.  Apply the CHG Soap to your body ONLY FROM THE NECK DOWN.   Do not use on face/ open                           Wound or open sores. Avoid contact with eyes, ears mouth and genitals (private parts).                       Wash face,  Genitals (private parts) with your normal soap.             6.  Wash thoroughly, paying special attention to the area where your surgery  will be performed.  7.  Thoroughly rinse your body with warm water from the neck down.  8.  DO NOT shower/wash with your normal soap after using and rinsing off  the CHG Soap.                9.  Pat yourself dry with a clean towel.            10.  Wear clean pajamas.            11.  Place clean sheets on your bed the night of your first shower and do not  sleep with pets.  Day of Surgery : Do not apply any lotions/deodorants the morning of surgery.  Please wear clean clothes to the hospital/surgery [facility].  FAILURE TO FOLLOW THESE INSTRUCTIONS MAY RESULT IN THE CANCELLATION OF YOUR  SURGERY  ________________________________________________________________________    ________________________________________________________________________ How to Manage Your Diabetes Before and After Surgery  Why is it important to control my blood sugar before and after surgery? . Improving blood sugar levels before and after surgery helps healing and can limit problems. . A way of improving blood sugar control is eating a healthy diet by: o  Eating less sugar and carbohydrates o  Increasing activity/exercise o  Talking with your doctor about reaching your blood sugar goals . High blood sugars (greater than 180 mg/dL) can raise your risk of infections and slow your recovery, so you will need to focus on controlling your diabetes during the weeks before surgery. . Make sure that the doctor who takes care of your diabetes knows about your planned surgery including the date and location.  How do I manage my blood sugar before surgery? . Check your blood sugar at least 4 times a day, starting 2 days before surgery, to make sure that the level is not too high or low. o Check your blood sugar the morning of your surgery when you wake up and every 2 hours until you get to the Short Stay unit. . If your blood sugar is less than 70 mg/dL, you will need to treat for low blood sugar: o Do not take insulin. o Treat a low blood sugar (less than 70 mg/dL) with  cup of clear juice (cranberry or apple), 4 glucose tablets, OR glucose gel. o Recheck blood sugar in 15 minutes after treatment (to make sure it is greater than 70 mg/dL). If your blood sugar is not greater than 70 mg/dL on recheck, call [phone] for further instructions. . Report your blood sugar to the short stay nurse when you get to Short Stay.  . If you are admitted to the hospital after surgery: o Your blood sugar will be checked by the staff and you will probably be given insulin after surgery (instead of oral diabetes  medicines) to make sure you have good blood sugar levels. o The goal for blood sugar control after surgery is 80-180 mg/dL.   WHAT DO I DO ABOUT MY DIABETES MEDICATION?  Marland Kitchen Do not take oral diabetes medicines (pills) the morning of surgery.   Patient Signature:  Date:   Nurse Signature:  Date:   Reviewed and Endorsed by [facility] Patient Education Committee, August 2015

## 2016-05-21 ENCOUNTER — Encounter (HOSPITAL_COMMUNITY)
Admission: RE | Admit: 2016-05-21 | Discharge: 2016-05-21 | Disposition: A | Discharge status: Home / Self Care | Payer: BLUE CROSS/BLUE SHIELD | Source: Ambulatory Visit | Attending: General Surgery | Admitting: General Surgery

## 2016-05-21 ENCOUNTER — Encounter (HOSPITAL_COMMUNITY): Payer: Self-pay

## 2016-05-21 DIAGNOSIS — E119 Type 2 diabetes mellitus without complications: Secondary | ICD-10-CM | POA: Diagnosis not present

## 2016-05-21 DIAGNOSIS — Z6841 Body Mass Index (BMI) 40.0 and over, adult: Secondary | ICD-10-CM | POA: Diagnosis not present

## 2016-05-21 DIAGNOSIS — K5730 Diverticulosis of large intestine without perforation or abscess without bleeding: Secondary | ICD-10-CM | POA: Diagnosis not present

## 2016-05-21 DIAGNOSIS — K5732 Diverticulitis of large intestine without perforation or abscess without bleeding: Secondary | ICD-10-CM | POA: Diagnosis not present

## 2016-05-21 DIAGNOSIS — Z7984 Long term (current) use of oral hypoglycemic drugs: Secondary | ICD-10-CM | POA: Diagnosis not present

## 2016-05-21 DIAGNOSIS — K5792 Diverticulitis of intestine, part unspecified, without perforation or abscess without bleeding: Secondary | ICD-10-CM | POA: Insufficient documentation

## 2016-05-21 DIAGNOSIS — Z01818 Encounter for other preprocedural examination: Secondary | ICD-10-CM | POA: Insufficient documentation

## 2016-05-21 DIAGNOSIS — K529 Noninfective gastroenteritis and colitis, unspecified: Secondary | ICD-10-CM | POA: Diagnosis not present

## 2016-05-21 DIAGNOSIS — K660 Peritoneal adhesions (postprocedural) (postinfection): Secondary | ICD-10-CM | POA: Diagnosis not present

## 2016-05-21 DIAGNOSIS — K5720 Diverticulitis of large intestine with perforation and abscess without bleeding: Secondary | ICD-10-CM | POA: Diagnosis not present

## 2016-05-21 DIAGNOSIS — Z79899 Other long term (current) drug therapy: Secondary | ICD-10-CM | POA: Diagnosis not present

## 2016-05-21 DIAGNOSIS — R000 Tachycardia, unspecified: Secondary | ICD-10-CM | POA: Diagnosis not present

## 2016-05-21 DIAGNOSIS — G4730 Sleep apnea, unspecified: Secondary | ICD-10-CM | POA: Diagnosis not present

## 2016-05-21 LAB — BASIC METABOLIC PANEL
Anion gap: 10 (ref 5–15)
BUN: 15 mg/dL (ref 6–20)
CO2: 27 mmol/L (ref 22–32)
Calcium: 9.3 mg/dL (ref 8.9–10.3)
Chloride: 102 mmol/L (ref 101–111)
Creatinine, Ser: 1.06 mg/dL (ref 0.61–1.24)
GFR calc Af Amer: >60 mL/min (ref >60)
GFR calc non Af Amer: >60 mL/min (ref >60)
Glucose, Bld: 135 mg/dL — ABNORMAL HIGH (ref 65–99)
Potassium: 5.4 mmol/L — ABNORMAL HIGH (ref 3.5–5.1)
Sodium: 139 mmol/L (ref 135–145)

## 2016-05-21 LAB — CBC
HCT: 49.1 % (ref 39.0–52.0)
Hemoglobin: 16.3 g/dL (ref 13.0–17.0)
MCH: 28.2 pg (ref 26.0–34.0)
MCHC: 33.2 g/dL (ref 30.0–36.0)
MCV: 84.9 fL (ref 78.0–100.0)
Platelets: 230 10*3/uL (ref 150–400)
RBC: 5.78 MIL/uL (ref 4.22–5.81)
RDW: 15.5 % (ref 11.5–15.5)
WBC: 10.7 10*3/uL — ABNORMAL HIGH (ref 4.0–10.5)

## 2016-05-21 LAB — ABO/RH: ABO/RH(D): A POS

## 2016-05-21 LAB — GLUCOSE, CAPILLARY: Glucose-Capillary: 151 mg/dL — ABNORMAL HIGH (ref 65–99)

## 2016-05-21 NOTE — Pre-Procedure Instructions (Signed)
EKG 6'17, CT abd/pelvis 4'17. HGB A1C= 7.4 on 04-24-16 viewable in Epic.

## 2016-05-21 NOTE — Pre-Procedure Instructions (Signed)
05-21-16 [facility] Bed requested for surgery day.

## 2016-05-23 NOTE — H&P (Signed)
History of Present Illness  The patient is a 40 year old male who presents with diverticulitis. He is referred by Dr. Henrene Pastor for another opinion for consideration for elective colectomy for recurrent diverticulitis. Patient is known to our practice due to 2 hospitalizations for diverticulitis. Initially this occurred in May 2016. This was his first episode. He developed acute right lower abdominal pain. I reviewed CT scan from that time which showed segmental inflammatory change in the sigmoid colon with a small mesenteric abscess. This was treated with antibiotics alone with resolution. He did relatively well until March 2017 when he presented with a more severe episode of right lower quadrant abdominal pain. He was hospitalized and subsequently developed to intraperitoneal abscesses measuring up to 5 cm that were actually in the right lower quadrant and abutting the cecum but were associated with inflammatory changes in the sigmoid colon as well. He underwent percutaneous drainage and antibiotics with eventual resolution of the abscesses. He has had some continued mild abdominal symptoms. He will get some discomfort either in his right or left lateral abdomen occasionally which might last a day or less and is not severe. Often relieved with bowel movements or passing gas. He takes stool softeners or laxatives to keep his bowel movements regular and does not have constipation. He does have significant comorbidities of morbid obesity and adult-onset diabetes mellitus. Colonoscopy after his initial episode showed diverticulosis of the left colon had a small polyp removed from the right colon.    No Known Drug Allergies0  Medication History  Probiotic (Oral) Active. Metamucil (28% Packet, Oral 2 PACKS DAILY) Active. Invokana (100MG  Tablet, Oral) Active. MetFORMIN HCl (850MG  Tablet, Oral) Active. Multi Vitamin Daily (Oral) Active. Vyvanse (30MG  Capsule, Oral) Active. Medications  Reconciled  Vitals   Weight: 353 lb Height: 72in Body Surface Area: 2.71 m Body Mass Index: 47.87 kg/m  Temp.: 98.70F(Temporal)  Pulse: 120 (Irregular)  BP: 138/84 (Sitting, Left Arm, Standard)       Physical Exam  The physical exam findings are as follows: Note:General: Alert, morbidly obese Caucasian male, in no distress Skin: Warm and dry without rash or infection. HEENT: No palpable masses or thyromegaly. Sclera nonicteric. Pupils equal round and reactive. Oropharynx clear. Lymph nodes: No cervical, supraclavicular, or inguinal nodes palpable. Lungs: Breath sounds clear and equal. No wheezing or increased work of breathing. Cardiovascular: Regular rate and rhythm without murmer. No JVD or edema. Peripheral pulses intact. No carotid bruits. Abdomen: Nondistended. Obese. Soft and nontender. No masses palpable. No organomegaly. No palpable hernias. Extremities: No edema or joint swelling or deformity. No chronic venous stasis changes. Neurologic: Alert and fully oriented. Gait normal. No focal weakness. Psychiatric: Normal mood and affect. Thought content appropriate with normal judgement and insight    Assessment & Plan  DIVERTICULITIS OF COLON WITH PERFORATION (K57.20) Impression: 40 year old male with 2 significant episodes of sigmoid diverticulitis, the initial episode with a mesenteric abscess and the latest episode with a peritoneal abscess up to 5 cm abutting the cecum. He has some occasional abdominal symptoms but no clear evidence of recurrent diverticulitis since his last episode this spring. I will long discussion with the patient regarding pros and cons of elective sigmoid colectomy. Overall at his age with 2 severe episodes and 1 with an intraperitoneal abscess I think he likely would benefit from elective sigmoid colectomy to prevent future problems. However he does have significant comorbidities of morbid obesity and diabetes which would increase his  complication rate and chance of an open  procedure. However I think these issues are manageable should he decide to proceed with surgery. There is certainly some room for discussion and his wishes would play a role but I did come down on the side of recommending elective laparoscopic sigmoid colectomy. He was given literature regarding this procedure. We discussed the nature and a recovery and risks of anesthetic complications, bleeding, infection, anastomotic leak requiring colostomy and the risk of DVT and pulmonary embolus that would be elevated because of his weight. He will think these issues over and call me regarding further thoughts. I also did discuss his weight and strongly recommended he continue any efforts at weight reduction. I did bring up bariatric surgery which he was aware of but he he has not interested in surgical treatment for his weight at this time.  After he had time to consider alternatives discussed above he has requested proceeding with elective sigmoid colectomy.  We will arrange bowel prep and schedule him for surgery.  Current Plans Laparoscopic, possible open sigmoid colectomy

## 2016-05-24 ENCOUNTER — Inpatient Hospital Stay (HOSPITAL_COMMUNITY): Payer: BLUE CROSS/BLUE SHIELD | Admitting: Certified Registered"

## 2016-05-24 ENCOUNTER — Inpatient Hospital Stay (HOSPITAL_COMMUNITY)
Admission: RE | Admit: 2016-05-24 | Discharge: 2016-05-27 | DRG: 330 | Disposition: A | Discharge status: Home / Self Care | Payer: BLUE CROSS/BLUE SHIELD | Source: Ambulatory Visit | Attending: General Surgery | Admitting: General Surgery

## 2016-05-24 ENCOUNTER — Encounter (HOSPITAL_COMMUNITY): Payer: Self-pay | Admitting: *Deleted

## 2016-05-24 ENCOUNTER — Encounter (HOSPITAL_COMMUNITY)
Admission: RE | Disposition: A | Discharge status: Home / Self Care | Payer: Self-pay | Source: Ambulatory Visit | Attending: General Surgery

## 2016-05-24 DIAGNOSIS — E119 Type 2 diabetes mellitus without complications: Secondary | ICD-10-CM | POA: Diagnosis not present

## 2016-05-24 DIAGNOSIS — Z79899 Other long term (current) drug therapy: Secondary | ICD-10-CM

## 2016-05-24 DIAGNOSIS — Z7984 Long term (current) use of oral hypoglycemic drugs: Secondary | ICD-10-CM | POA: Diagnosis not present

## 2016-05-24 DIAGNOSIS — Z6841 Body Mass Index (BMI) 40.0 and over, adult: Secondary | ICD-10-CM | POA: Diagnosis not present

## 2016-05-24 DIAGNOSIS — K5732 Diverticulitis of large intestine without perforation or abscess without bleeding: Secondary | ICD-10-CM | POA: Diagnosis not present

## 2016-05-24 DIAGNOSIS — G4730 Sleep apnea, unspecified: Secondary | ICD-10-CM | POA: Diagnosis not present

## 2016-05-24 DIAGNOSIS — K529 Noninfective gastroenteritis and colitis, unspecified: Secondary | ICD-10-CM | POA: Diagnosis present

## 2016-05-24 DIAGNOSIS — K5720 Diverticulitis of large intestine with perforation and abscess without bleeding: Secondary | ICD-10-CM | POA: Diagnosis present

## 2016-05-24 DIAGNOSIS — K660 Peritoneal adhesions (postprocedural) (postinfection): Secondary | ICD-10-CM | POA: Diagnosis present

## 2016-05-24 DIAGNOSIS — R000 Tachycardia, unspecified: Secondary | ICD-10-CM | POA: Diagnosis not present

## 2016-05-24 DIAGNOSIS — K5730 Diverticulosis of large intestine without perforation or abscess without bleeding: Secondary | ICD-10-CM | POA: Diagnosis not present

## 2016-05-24 HISTORY — PX: LAPAROSCOPIC SIGMOID COLECTOMY: SHX5928

## 2016-05-24 LAB — GLUCOSE, CAPILLARY
Glucose-Capillary: 104 mg/dL — ABNORMAL HIGH (ref 65–99)
Glucose-Capillary: 123 mg/dL — ABNORMAL HIGH (ref 65–99)
Glucose-Capillary: 132 mg/dL — ABNORMAL HIGH (ref 65–99)
Glucose-Capillary: 195 mg/dL — ABNORMAL HIGH (ref 65–99)
Glucose-Capillary: 94 mg/dL (ref 65–99)

## 2016-05-24 LAB — HEMOGLOBIN AND HEMATOCRIT, BLOOD
HCT: 47.7 % (ref 39.0–52.0)
Hemoglobin: 15.6 g/dL (ref 13.0–17.0)

## 2016-05-24 SURGERY — COLECTOMY, SIGMOID, LAPAROSCOPIC
Anesthesia: General

## 2016-05-24 MED ORDER — SUGAMMADEX SODIUM 200 MG/2ML IV SOLN
INTRAVENOUS | Status: AC
  Filled 2016-05-24: qty 2

## 2016-05-24 MED ORDER — PROPOFOL 10 MG/ML IV BOLUS
INTRAVENOUS | Status: AC
  Filled 2016-05-24: qty 20

## 2016-05-24 MED ORDER — OXYCODONE-ACETAMINOPHEN 5-325 MG PO TABS
1.0000 | ORAL_TABLET | ORAL | Status: DC | PRN
  Administered 2016-05-24 – 2016-05-26 (×11): 2 via ORAL
  Filled 2016-05-24 (×11): qty 2

## 2016-05-24 MED ORDER — HYDROMORPHONE HCL 2 MG/ML IJ SOLN
INTRAMUSCULAR | Status: AC
  Filled 2016-05-24: qty 1

## 2016-05-24 MED ORDER — LACTATED RINGERS IR SOLN
Status: DC | PRN
  Administered 2016-05-24: 3000 mL

## 2016-05-24 MED ORDER — BUPIVACAINE LIPOSOME 1.3 % IJ SUSP
20.0000 mL | Freq: Once | INTRAMUSCULAR | Status: AC
  Administered 2016-05-24: 20 mL
  Filled 2016-05-24: qty 20

## 2016-05-24 MED ORDER — INSULIN ASPART 100 UNIT/ML ~~LOC~~ SOLN
0.0000 [IU] | SUBCUTANEOUS | Status: DC
  Administered 2016-05-24 – 2016-05-26 (×4): 2 [IU] via SUBCUTANEOUS
  Administered 2016-05-26: 3 [IU] via SUBCUTANEOUS
  Administered 2016-05-27: 2 [IU] via SUBCUTANEOUS

## 2016-05-24 MED ORDER — MIDAZOLAM HCL 5 MG/5ML IJ SOLN
INTRAMUSCULAR | Status: DC | PRN
  Administered 2016-05-24: 2 mg via INTRAVENOUS

## 2016-05-24 MED ORDER — LACTATED RINGERS IV SOLN
INTRAVENOUS | Status: DC | PRN
  Administered 2016-05-24 (×2): via INTRAVENOUS

## 2016-05-24 MED ORDER — ALVIMOPAN 12 MG PO CAPS
12.0000 mg | ORAL_CAPSULE | Freq: Two times a day (BID) | ORAL | Status: DC
  Administered 2016-05-25: 12 mg via ORAL
  Filled 2016-05-24: qty 1

## 2016-05-24 MED ORDER — ACETAMINOPHEN 500 MG PO TABS
1000.0000 mg | ORAL_TABLET | ORAL | Status: AC
  Administered 2016-05-24: 1000 mg via ORAL
  Filled 2016-05-24: qty 2

## 2016-05-24 MED ORDER — ROCURONIUM BROMIDE 50 MG/5ML IV SOSY
PREFILLED_SYRINGE | INTRAVENOUS | Status: AC
  Filled 2016-05-24: qty 5

## 2016-05-24 MED ORDER — LIDOCAINE 2% (20 MG/ML) 5 ML SYRINGE
INTRAMUSCULAR | Status: AC
  Filled 2016-05-24: qty 5

## 2016-05-24 MED ORDER — CHLORHEXIDINE GLUCONATE CLOTH 2 % EX PADS: 6.0000 | MEDICATED_PAD | Freq: Once | CUTANEOUS | Status: DC

## 2016-05-24 MED ORDER — SODIUM CHLORIDE 0.9 % IJ SOLN
INTRAMUSCULAR | Status: AC
  Filled 2016-05-24: qty 50

## 2016-05-24 MED ORDER — OXYCODONE HCL 5 MG/5ML PO SOLN
5.0000 mg | Freq: Once | ORAL | Status: DC | PRN
  Filled 2016-05-24: qty 5

## 2016-05-24 MED ORDER — SUCCINYLCHOLINE CHLORIDE 200 MG/10ML IV SOSY
PREFILLED_SYRINGE | INTRAVENOUS | Status: DC | PRN
  Administered 2016-05-24: 140 mg via INTRAVENOUS

## 2016-05-24 MED ORDER — SODIUM CHLORIDE 0.9 % IV SOLN
INTRAVENOUS | Status: DC
  Administered 2016-05-24 – 2016-05-26 (×5): via INTRAVENOUS

## 2016-05-24 MED ORDER — ONDANSETRON HCL 4 MG PO TABS: 4.0000 mg | ORAL_TABLET | Freq: Four times a day (QID) | ORAL | Status: DC | PRN

## 2016-05-24 MED ORDER — GABAPENTIN 300 MG PO CAPS
300.0000 mg | ORAL_CAPSULE | ORAL | Status: AC
  Administered 2016-05-24: 300 mg via ORAL
  Filled 2016-05-24: qty 1

## 2016-05-24 MED ORDER — BUPIVACAINE HCL (PF) 0.25 % IJ SOLN
INTRAMUSCULAR | Status: AC
  Filled 2016-05-24: qty 30

## 2016-05-24 MED ORDER — HEPARIN SODIUM (PORCINE) 5000 UNIT/ML IJ SOLN
5000.0000 [IU] | Freq: Once | INTRAMUSCULAR | Status: AC
  Administered 2016-05-24: 5000 [IU] via SUBCUTANEOUS
  Filled 2016-05-24: qty 1

## 2016-05-24 MED ORDER — HYDROMORPHONE HCL 1 MG/ML IJ SOLN: 0.2500 mg | INTRAMUSCULAR | Status: DC | PRN

## 2016-05-24 MED ORDER — HYDROMORPHONE HCL 1 MG/ML IJ SOLN
INTRAMUSCULAR | Status: DC | PRN
  Administered 2016-05-24 (×2): 1 mg via INTRAVENOUS

## 2016-05-24 MED ORDER — CEFOTETAN DISODIUM-DEXTROSE 2-2.08 GM-% IV SOLR
INTRAVENOUS | Status: AC
  Filled 2016-05-24: qty 50

## 2016-05-24 MED ORDER — OXYCODONE HCL 5 MG PO TABS: 5.0000 mg | ORAL_TABLET | Freq: Once | ORAL | Status: DC | PRN

## 2016-05-24 MED ORDER — ENOXAPARIN SODIUM 30 MG/0.3ML ~~LOC~~ SOLN
30.0000 mg | Freq: Two times a day (BID) | SUBCUTANEOUS | Status: DC
  Administered 2016-05-24 – 2016-05-27 (×6): 30 mg via SUBCUTANEOUS
  Filled 2016-05-24 (×6): qty 0.3

## 2016-05-24 MED ORDER — BUPIVACAINE HCL (PF) 0.25 % IJ SOLN
INTRAMUSCULAR | Status: DC | PRN
  Administered 2016-05-24: 30 mL

## 2016-05-24 MED ORDER — ROCURONIUM BROMIDE 10 MG/ML (PF) SYRINGE
PREFILLED_SYRINGE | INTRAVENOUS | Status: DC | PRN
  Administered 2016-05-24: 20 mg via INTRAVENOUS
  Administered 2016-05-24: 10 mg via INTRAVENOUS
  Administered 2016-05-24: 50 mg via INTRAVENOUS
  Administered 2016-05-24 (×2): 20 mg via INTRAVENOUS
  Administered 2016-05-24: 10 mg via INTRAVENOUS
  Administered 2016-05-24 (×2): 20 mg via INTRAVENOUS

## 2016-05-24 MED ORDER — LABETALOL HCL 5 MG/ML IV SOLN
INTRAVENOUS | Status: DC | PRN
  Administered 2016-05-24 (×2): 5 mg via INTRAVENOUS

## 2016-05-24 MED ORDER — POTASSIUM CHLORIDE IN NACL 20-0.9 MEQ/L-% IV SOLN: INTRAVENOUS | Status: DC

## 2016-05-24 MED ORDER — LACTATED RINGERS IV SOLN: INTRAVENOUS | Status: DC

## 2016-05-24 MED ORDER — 0.9 % SODIUM CHLORIDE (POUR BTL) OPTIME
TOPICAL | Status: DC | PRN
  Administered 2016-05-24: 3000 mL

## 2016-05-24 MED ORDER — PROPOFOL 10 MG/ML IV BOLUS
INTRAVENOUS | Status: DC | PRN
  Administered 2016-05-24: 200 mg via INTRAVENOUS

## 2016-05-24 MED ORDER — SUGAMMADEX SODIUM 500 MG/5ML IV SOLN
INTRAVENOUS | Status: AC
  Filled 2016-05-24: qty 5

## 2016-05-24 MED ORDER — CELECOXIB 200 MG PO CAPS
400.0000 mg | ORAL_CAPSULE | ORAL | Status: AC
  Administered 2016-05-24: 400 mg via ORAL
  Filled 2016-05-24: qty 2

## 2016-05-24 MED ORDER — FENTANYL CITRATE (PF) 100 MCG/2ML IJ SOLN
INTRAMUSCULAR | Status: AC
  Filled 2016-05-24: qty 4

## 2016-05-24 MED ORDER — ONDANSETRON HCL 4 MG/2ML IJ SOLN
INTRAMUSCULAR | Status: DC | PRN
  Administered 2016-05-24: 4 mg via INTRAVENOUS

## 2016-05-24 MED ORDER — SODIUM CHLORIDE 0.9 % IJ SOLN
INTRAMUSCULAR | Status: DC | PRN
  Administered 2016-05-24: 20 mL

## 2016-05-24 MED ORDER — ONDANSETRON HCL 4 MG/2ML IJ SOLN: 4.0000 mg | Freq: Four times a day (QID) | INTRAMUSCULAR | Status: DC | PRN

## 2016-05-24 MED ORDER — MORPHINE SULFATE (PF) 2 MG/ML IV SOLN
2.0000 mg | INTRAVENOUS | Status: DC | PRN
  Administered 2016-05-25: 2 mg via INTRAVENOUS
  Filled 2016-05-24: qty 1

## 2016-05-24 MED ORDER — SUCCINYLCHOLINE CHLORIDE 200 MG/10ML IV SOSY
PREFILLED_SYRINGE | INTRAVENOUS | Status: AC
  Filled 2016-05-24: qty 10

## 2016-05-24 MED ORDER — LABETALOL HCL 5 MG/ML IV SOLN
INTRAVENOUS | Status: AC
  Filled 2016-05-24: qty 4

## 2016-05-24 MED ORDER — ONDANSETRON HCL 4 MG/2ML IJ SOLN
INTRAMUSCULAR | Status: AC
  Filled 2016-05-24: qty 2

## 2016-05-24 MED ORDER — SUGAMMADEX SODIUM 200 MG/2ML IV SOLN
INTRAVENOUS | Status: DC | PRN
  Administered 2016-05-24: 400 mg via INTRAVENOUS

## 2016-05-24 MED ORDER — LIDOCAINE 2% (20 MG/ML) 5 ML SYRINGE
INTRAMUSCULAR | Status: DC | PRN
  Administered 2016-05-24: 30 mg via INTRAVENOUS

## 2016-05-24 MED ORDER — ALVIMOPAN 12 MG PO CAPS
12.0000 mg | ORAL_CAPSULE | Freq: Once | ORAL | Status: AC
  Administered 2016-05-24: 12 mg via ORAL
  Filled 2016-05-24: qty 1

## 2016-05-24 MED ORDER — FENTANYL CITRATE (PF) 100 MCG/2ML IJ SOLN
INTRAMUSCULAR | Status: DC | PRN
  Administered 2016-05-24: 50 ug via INTRAVENOUS
  Administered 2016-05-24: 100 ug via INTRAVENOUS
  Administered 2016-05-24: 50 ug via INTRAVENOUS

## 2016-05-24 MED ORDER — DEXTROSE 5 % IV SOLN
2.0000 g | INTRAVENOUS | Status: AC
  Administered 2016-05-24: 2 g via INTRAVENOUS
  Filled 2016-05-24: qty 2

## 2016-05-24 MED ORDER — MIDAZOLAM HCL 2 MG/2ML IJ SOLN
INTRAMUSCULAR | Status: AC
  Filled 2016-05-24: qty 2

## 2016-05-24 SURGICAL SUPPLY — 66 items
APPLIER CLIP ROT 10 11.4 M/L (STAPLE)
BLADE EXTENDED COATED 6.5IN (ELECTRODE) ×2 IMPLANT
CABLE HIGH FREQUENCY MONO STRZ (ELECTRODE) ×2 IMPLANT
CELLS DAT CNTRL 66122 CELL SVR (MISCELLANEOUS) IMPLANT
CLIP APPLIE ROT 10 11.4 M/L (STAPLE) IMPLANT
COUNTER NEEDLE 20 DBL MAG RED (NEEDLE) ×2 IMPLANT
COVER MAYO STAND STRL (DRAPES) ×6 IMPLANT
COVER SURGICAL LIGHT HANDLE (MISCELLANEOUS) ×4 IMPLANT
CUTTER FLEX LINEAR 45M (STAPLE) ×2 IMPLANT
DECANTER SPIKE VIAL GLASS SM (MISCELLANEOUS) ×2 IMPLANT
DERMABOND ADVANCED (GAUZE/BANDAGES/DRESSINGS)
DERMABOND ADVANCED .7 DNX12 (GAUZE/BANDAGES/DRESSINGS) IMPLANT
DRAIN CHANNEL 19F RND (DRAIN) IMPLANT
DRAPE SHEET LG 3/4 BI-LAMINATE (DRAPES) ×4 IMPLANT
DRAPE WARM FLUID 44X44 (DRAPE) ×2 IMPLANT
DRSG OPSITE POSTOP 4X10 (GAUZE/BANDAGES/DRESSINGS) IMPLANT
DRSG OPSITE POSTOP 4X6 (GAUZE/BANDAGES/DRESSINGS) IMPLANT
DRSG OPSITE POSTOP 4X8 (GAUZE/BANDAGES/DRESSINGS) IMPLANT
GAUZE SPONGE 4X4 12PLY STRL (GAUZE/BANDAGES/DRESSINGS) IMPLANT
GLOVE BIOGEL PI IND STRL 7.5 (GLOVE) ×2 IMPLANT
GLOVE BIOGEL PI INDICATOR 7.5 (GLOVE) ×2
GLOVE ECLIPSE 7.5 STRL STRAW (GLOVE) ×4 IMPLANT
GOWN STRL REUS W/TWL XL LVL3 (GOWN DISPOSABLE) ×12 IMPLANT
GRASPER ENDOPATH ANVIL 10MM (MISCELLANEOUS) ×2 IMPLANT
IRRIG SUCT STRYKERFLOW 2 WTIP (MISCELLANEOUS) ×2
IRRIGATION SUCT STRKRFLW 2 WTP (MISCELLANEOUS) ×1 IMPLANT
LEGGING LITHOTOMY PAIR STRL (DRAPES) ×2 IMPLANT
PACK COLON (CUSTOM PROCEDURE TRAY) ×2 IMPLANT
PAD POSITIONING PINK XL (MISCELLANEOUS) ×2 IMPLANT
PORT LAP GEL ALEXIS MED 5-9CM (MISCELLANEOUS) ×2 IMPLANT
POSITIONER SURGICAL ARM (MISCELLANEOUS) IMPLANT
RELOAD STAPLE TA45 3.5 REG BLU (ENDOMECHANICALS) ×6 IMPLANT
RTRCTR WOUND ALEXIS 18CM MED (MISCELLANEOUS)
SCISSORS LAP 5X35 DISP (ENDOMECHANICALS) ×2 IMPLANT
SEALER TISSUE X1 CVD JAW (INSTRUMENTS) IMPLANT
SHEARS CURVED HARMONIC AC 45CM (MISCELLANEOUS) ×2 IMPLANT
SLEEVE ADV FIXATION 5X100MM (TROCAR) ×4 IMPLANT
SLEEVE XCEL OPT CAN 5 100 (ENDOMECHANICALS) ×2 IMPLANT
STAPLER CIRC CVD 29MM 37CM (STAPLE) ×2 IMPLANT
STAPLER VISISTAT 35W (STAPLE) IMPLANT
SUT MNCRL AB 4-0 PS2 18 (SUTURE) ×4 IMPLANT
SUT PDS AB 0 CT1 36 (SUTURE) IMPLANT
SUT PDS AB 0 CTX 36 PDP370T (SUTURE) ×6 IMPLANT
SUT PDS AB 1 CTX 36 (SUTURE) IMPLANT
SUT PDS AB 1 TP1 96 (SUTURE) IMPLANT
SUT PROLENE 2 0 KS (SUTURE) ×2 IMPLANT
SUT PROLENE 3 0 SH 48 (SUTURE) IMPLANT
SUT SILK 2 0 (SUTURE) ×1
SUT SILK 2 0 SH CR/8 (SUTURE) ×2 IMPLANT
SUT SILK 2-0 18XBRD TIE 12 (SUTURE) ×1 IMPLANT
SUT SILK 3 0 (SUTURE) ×1
SUT SILK 3 0 SH CR/8 (SUTURE) ×2 IMPLANT
SUT SILK 3-0 18XBRD TIE 12 (SUTURE) ×1 IMPLANT
SUT VIC AB 2-0 SH 27 (SUTURE) ×1
SUT VIC AB 2-0 SH 27X BRD (SUTURE) ×1 IMPLANT
SYS LAPSCP GELPORT 120MM (MISCELLANEOUS)
SYSTEM LAPSCP GELPORT 120MM (MISCELLANEOUS) IMPLANT
TAPE CLOTH 4X10 WHT NS (GAUZE/BANDAGES/DRESSINGS) IMPLANT
TOWEL OR NON WOVEN STRL DISP B (DISPOSABLE) ×2 IMPLANT
TRAY FOLEY W/METER SILVER 16FR (SET/KITS/TRAYS/PACK) ×2 IMPLANT
TROCAR ADV FIXATION 12X100MM (TROCAR) ×2 IMPLANT
TROCAR BLADELESS OPT 5 100 (ENDOMECHANICALS) ×2 IMPLANT
TROCAR XCEL 12X100 BLDLESS (ENDOMECHANICALS) IMPLANT
TROCAR XCEL BLUNT TIP 100MML (ENDOMECHANICALS) IMPLANT
TROCAR XCEL NON-BLD 11X100MML (ENDOMECHANICALS) IMPLANT
TUBING INSUF HEATED (TUBING) ×2 IMPLANT

## 2016-05-24 NOTE — Anesthesia Postprocedure Evaluation (Signed)
Anesthesia Post Note  Patient: Spencer Good  Procedure(s) Performed: Procedure(s) (LRB): LAPAROSCOPIC SIGMOID COLECTOMY (N/A)  Patient location during evaluation: PACU Anesthesia Type: General Level of consciousness: awake and alert and patient cooperative Pain management: pain level controlled Vital Signs Assessment: post-procedure vital signs reviewed and stable Respiratory status: spontaneous breathing and respiratory function stable Cardiovascular status: stable Anesthetic complications: no    Last Vitals:  Vitals:   05/24/16 1230 05/24/16 1245  BP: (!) 170/91 (!) 175/88  Pulse: (!) 113 (!) 110  Resp: (!) 23 20  Temp:  37.1 C    Last Pain:  Vitals:   05/24/16 1245  TempSrc:   PainSc: 0-No pain                 Darnelle Derrick S

## 2016-05-24 NOTE — Op Note (Signed)
Preoperative Diagnosis: DIVERTICULITIS  Postoprative Diagnosis: DIVERTICULITIS  Procedure: Procedure(s): LAPAROSCOPIC SIGMOID COLECTOMY   Surgeon: Excell Seltzer T   Assistants: Armandina Gemma  Anesthesia:  General endotracheal anesthesia  Indications: Patient is a 40 year old male with 2 previous episodes of severe diverticulitis of the sigmoid colon, the last resulting in a 5 cm abscess requiring percutaneous drainage. He continues to have some ongoing discomfort in his lower abdomen. After extensive preoperative workup and discussion detailed elsewhere regarding options and risks of surgery we have elected to proceed with laparoscopic sigmoid colectomy in an effort to relieve his symptoms and prevent further complications.    Procedure Detail:  Patient had undergone a mechanical and antibiotic bowel prep at home. He was taken to the operating room, placed in supine position on the operating table and general endotracheal anesthesia induced. Foley catheter was placed. He received preoperative subcutaneous heparin and broad-spectrum IV antibiotics. He was carefully placed in semi-lithotomy position with yellowfin stirrups and securely padded and fixed to the table and the abdomen and perineum were widely sterilely prepped and draped. Patient timeout was performed and correct procedure verified. Access was obtained with a 5 mm Optiview trocar in the left upper quadrant and pneumoperitoneum established. There were noted to be fairly extensive omental adhesions to previously placed periumbilical mesh. A 5 mm trocar was placed in the left lower quadrant and these fairly extensive adhesions were carefully taken down with the Harmonic scalpel and the omentum was completely mobilized. There were no bowel adhesions to the mesh. Following this a 5 mm trocar was placed just above the umbilicus for the camera port, a 5 mm trocar in the right lateral abdomen and a 12 mm trocar in the right lower quadrant all  under direct vision. The sigmoid colon was exposed and there was a segment of severely thickened chronically inflamed colon at the mid sigmoid colon. Initially inflammatory lateral attachments to the pelvic sidewall were taken down with the Harmonic scalpel. I initially approached the sigmoid mesentery from a medial to lateral position but the mesentery was markedly thickened and fibrotic from previous abscess and I really could not safely dissect from a medial approach. The colon was then mobilized lateral to medial dividing lateral peritoneal attachments along the sigmoid and left colon. There was also a fair amount of fibrosis around the sigmoid mesentery from this approach. After some initial mobilization the entire left colon was mobilized along the peritoneal reflection and reflecting it medially. Splenic flexure was exposed and completely mobilized. Omentum was dissected off of the distal transverse colon and the splenocolic ligament taken down with the Harmonic scalpel and the mesentery of the splenic flexure and proximal left colon reflected completely medially off of the serratus fascia. After this full mobilization of the splenic flexure we could continued distally mobilizing the colon medially and as we came down along the sigmoid mesentery the ureter was clearly identified and dissected free over a area adjacent to the mesentery and carefully protected during the remainder of the dissection. With the ureter identified areas for proximal and distal resection were chosen. The distal sigmoid just proximal to this appearance of the tinea looked very soft and normal and was chosen for distal resection. A mesenteric window was created with careful blunt and Harmonic scalpel dissection posterior to this and the colon was divided distally with 3 firings of the blue load 45 mm stapler. Following this the mesentery of the distal sigmoid and sigmoid was dissected with the Harmonic scalpel. The mesenteric was very  thickened and fibrotic and actually calcified in areas. I stayed anterior to the retroperitoneum with the ureter exposed and protected. The dissection progressed proximally until we got to the mesentery of the distal left colon which was soft and normal. At this point a approximately 5-6 cm incision was created in the left lower quadrant 5 mm port site and the tendinous tissue was dissected with the cautery and a muscle splitting incision used and the peritoneum entered and wound protector placed. The end of the divided sigmoid and diseased segment was then able to be easily brought out with the wound protector placed and the distal left colon which was soft and normal-appearing was cleared of mesentery and pericolic fat. It was divided after placing a 2-0 Prolene pursestring suture with the pursestring clamp and the specimen was removed. The end of the left colon was sized and easily accepted a 29 mm anvil. The anvil was placed and the pursestring secured and the bowel returned to the abdominal cavity. Pneumoperitoneum was reestablished. Dr. Harlow Asa then went below and was able to easily pass a 29 mm stapler up to the stapled end of the rectosigmoid. The spike was deployed just anterior to the midportion of the staple line, the anvil attached and closed excluding all extraneous tissue and the stapler fired. The doughnuts were thick and intact. Dr. Harlow Asa then potentially insufflated the rectosigmoid with a rigid sigmoidoscope and with the proximal bowel clamped under saline irrigation there was no evidence of leak. The abdomen was thoroughly irrigated and hemostasis assured. There was no twisting of the bowel. No evidence of injury. All CO2 was evacuated, trochars removed and all gloves gowns and drapes changed. The left lower quadrant incision muscle layers were infiltrated with dilute Exparel. Incision was closed in layers with running 0 PDS. Subcutaneous was closed with interrupted 3-0 Vicryl and all incisions  closed with subcuticular Monocryl and Dermabond. Sponge needle and instrument counts were correct.    Findings: As above  Estimated Blood Loss:  less than 100 mL         Drains: None  Blood Given: none          Specimens: Sigmoid colon        Complications:  * No complications entered in OR log *         Disposition: PACU - hemodynamically stable.         Condition: stable

## 2016-05-24 NOTE — Anesthesia Preprocedure Evaluation (Signed)
Anesthesia Evaluation  Patient identified by MRN, date of birth, ID band Patient awake    Reviewed: Allergy & Precautions, H&P , NPO status , Patient's Chart, lab work & pertinent test results  Airway Mallampati: II   Neck ROM: full    Dental   Pulmonary sleep apnea , former smoker,    breath sounds clear to auscultation       Cardiovascular negative cardio ROS   Rhythm:regular Rate:Normal     Neuro/Psych    GI/Hepatic diverticulitis   Endo/Other  diabetes, Type 2Morbid obesity  Renal/GU      Musculoskeletal   Abdominal   Peds  Hematology   Anesthesia Other Findings   Reproductive/Obstetrics                             Anesthesia Physical Anesthesia Plan  ASA: II  Anesthesia Plan: General   Post-op Pain Management:    Induction: Intravenous  Airway Management Planned: Oral ETT  Additional Equipment:   Intra-op Plan:   Post-operative Plan: Extubation in OR  Informed Consent: I have reviewed the patients History and Physical, chart, labs and discussed the procedure including the risks, benefits and alternatives for the proposed anesthesia with the patient or authorized representative who has indicated his/her understanding and acceptance.     Plan Discussed with: CRNA, Anesthesiologist and Surgeon  Anesthesia Plan Comments:         Anesthesia Quick Evaluation

## 2016-05-24 NOTE — Anesthesia Procedure Notes (Signed)
Procedure Name: Intubation Date/Time: 05/24/2016 7:36 AM Performed by: Lajuana Carry E Pre-anesthesia Checklist: Patient identified, Emergency Drugs available, Suction available and Patient being monitored Patient Re-evaluated:Patient Re-evaluated prior to inductionOxygen Delivery Method: Circle system utilized Preoxygenation: Pre-oxygenation with 100% oxygen Intubation Type: IV induction Ventilation: Mask ventilation without difficulty Laryngoscope Size: Miller and 3 Grade View: Grade II Tube type: Oral Tube size: 7.5 mm Number of attempts: 1 Airway Equipment and Method: Stylet Placement Confirmation: ETT inserted through vocal cords under direct vision,  positive ETCO2 and breath sounds checked- equal and bilateral Secured at: 22 cm Tube secured with: Tape Dental Injury: Teeth and Oropharynx as per pre-operative assessment

## 2016-05-24 NOTE — Interval H&P Note (Signed)
History and Physical Interval Note:  05/24/2016 7:19 AM  Spencer Good  has presented today for surgery, with the diagnosis of DIVERTICULITIS  The various methods of treatment have been discussed with the patient and family. After consideration of risks, benefits and other options for treatment, the patient has consented to  Procedure(s): LAPAROSCOPIC SIGMOID COLECTOMY (N/A) as a surgical intervention .  The patient's history has been reviewed, patient examined, no change in status, stable for surgery.  I have reviewed the patient's chart and labs.  Questions were answered to the patient's satisfaction.     Spencer Good T

## 2016-05-24 NOTE — Transfer of Care (Signed)
Immediate Anesthesia Transfer of Care Note  Patient: Spencer Good  Procedure(s) Performed: Procedure(s): LAPAROSCOPIC SIGMOID COLECTOMY (N/A)  Patient Location: PACU  Anesthesia Type:General  Level of Consciousness:  sedated, patient cooperative and responds to stimulation  Airway & Oxygen Therapy:Patient Spontanous Breathing and Patient connected to face mask oxgen  Post-op Assessment:  Report given to PACU RN and Post -op Vital signs reviewed and stable  Post vital signs:  Reviewed and stable  Last Vitals:  Vitals:   05/24/16 0536 05/24/16 1200  BP: (!) 141/98   Pulse: 100   Resp: 18   Temp: 36.5 C (P) 123XX123 C    Complications: No apparent anesthesia complications

## 2016-05-25 LAB — CBC
HCT: 44.4 % (ref 39.0–52.0)
Hemoglobin: 14.3 g/dL (ref 13.0–17.0)
MCH: 27.9 pg (ref 26.0–34.0)
MCHC: 32.2 g/dL (ref 30.0–36.0)
MCV: 86.7 fL (ref 78.0–100.0)
Platelets: 191 10*3/uL (ref 150–400)
RBC: 5.12 MIL/uL (ref 4.22–5.81)
RDW: 15.5 % (ref 11.5–15.5)
WBC: 9 10*3/uL (ref 4.0–10.5)

## 2016-05-25 LAB — GLUCOSE, CAPILLARY
Glucose-Capillary: 111 mg/dL — ABNORMAL HIGH (ref 65–99)
Glucose-Capillary: 115 mg/dL — ABNORMAL HIGH (ref 65–99)
Glucose-Capillary: 128 mg/dL — ABNORMAL HIGH (ref 65–99)
Glucose-Capillary: 134 mg/dL — ABNORMAL HIGH (ref 65–99)
Glucose-Capillary: 75 mg/dL (ref 65–99)
Glucose-Capillary: 84 mg/dL (ref 65–99)

## 2016-05-25 LAB — BASIC METABOLIC PANEL WITH GFR
Anion gap: 6 (ref 5–15)
BUN: 11 mg/dL (ref 6–20)
CO2: 27 mmol/L (ref 22–32)
Calcium: 8.3 mg/dL — ABNORMAL LOW (ref 8.9–10.3)
Chloride: 105 mmol/L (ref 101–111)
Creatinine, Ser: 0.86 mg/dL (ref 0.61–1.24)
GFR calc Af Amer: >60 mL/min
GFR calc non Af Amer: >60 mL/min
Glucose, Bld: 117 mg/dL — ABNORMAL HIGH (ref 65–99)
Potassium: 3.8 mmol/L (ref 3.5–5.1)
Sodium: 138 mmol/L (ref 135–145)

## 2016-05-25 NOTE — Progress Notes (Signed)
Pharmacy Brief Note - Alvimopan (Entereg)  The standing order set for alvimopan (Entereg) now includes an automatic order to discontinue the drug after the patient has had a bowel movement.  The change was approved by the [place] and the Medical Executive Committee.    This patient has had a bowel movement confirmed by the patient and nursing.  Therefore, alvimopan has been discontinued.  If there are questions, please contact the pharmacy at [phone].  Thank you  Gretta Arab PharmD, BCPS Pager [phone] 05/25/2016 12:24 PM

## 2016-05-25 NOTE — Progress Notes (Signed)
1 Day Post-Op  Subjective: Doing well, no nausea, having bowel function, ambulating well  Objective: Vital signs in last 24 hours: Temp:  [97.7 F (36.5 C)-98.7 F (37.1 C)] 98.6 F (37 C) (12/09 0520) Pulse Rate:  [91-115] 94 (12/09 0520) Resp:  [18-23] 18 (12/09 0520) BP: (120-175)/(70-97) 138/71 (12/09 0520) SpO2:  [90 %-97 %] 97 % (12/09 0520)   Intake/Output from previous day: 12/08 0701 - 12/09 0700 In: 2660 [P.O.:60; I.V.:2550; IV Piggyback:50] Out: 1900 [Urine:1800; Blood:100] Intake/Output this shift: Total I/O In: -  Out: 300 [Urine:300]   General appearance: alert and cooperative GI: soft, non-distended  Incision: no significant drainage  Lab Results:   Recent Labs  05/24/16 1815 05/25/16 0504  WBC  --  9.0  HGB 15.6 14.3  HCT 47.7 44.4  PLT  --  191   BMET  Recent Labs  05/25/16 0504  NA 138  K 3.8  CL 105  CO2 27  GLUCOSE 117*  BUN 11  CREATININE 0.86  CALCIUM 8.3*   PT/INR No results for input(s): LABPROT, INR in the last 72 hours. ABG No results for input(s): PHART, HCO3 in the last 72 hours.  Invalid input(s): PCO2, PO2  MEDS, Scheduled . alvimopan  12 mg Oral BID  . enoxaparin (LOVENOX) injection  30 mg Subcutaneous Q12H  . insulin aspart  0-15 Units Subcutaneous Q4H    Studies/Results: No results found.  Assessment: s/p Procedure(s): LAPAROSCOPIC SIGMOID COLECTOMY Patient Active Problem List   Diagnosis Date Noted  . Diverticulitis of sigmoid colon 05/24/2016  . Morbid obesity ([place]) 04/25/2016  . ADD (attention deficit disorder) 12/28/2015  . Diverticulitis of large intestine with abscess without bleeding 09/13/2015  . Acute right lower quadrant pain 09/13/2015  . PCP NOTES >>>>>>>>>>>>>>>>>>>> 09/07/2015  . Diabetes type 2, uncontrolled ([place]) 10/23/2014  . Elevated WBCs 01/27/2014  . Diabetes ([place]) 02/17/2013  . Tachycardia 08/29/2011  . TOBACCO USER 06/29/2010    Expected post op course  Plan: Advance  diet to clears Foley out  Ambulate   LOS: 1 day     .Rosario Adie, [facility], [place]   05/25/2016 11:18 AM

## 2016-05-26 LAB — BASIC METABOLIC PANEL
Anion gap: 6 (ref 5–15)
BUN: 9 mg/dL (ref 6–20)
CO2: 28 mmol/L (ref 22–32)
Calcium: 8.4 mg/dL — ABNORMAL LOW (ref 8.9–10.3)
Chloride: 104 mmol/L (ref 101–111)
Creatinine, Ser: 0.77 mg/dL (ref 0.61–1.24)
GFR calc Af Amer: >60 mL/min (ref >60)
GFR calc non Af Amer: >60 mL/min (ref >60)
Glucose, Bld: 112 mg/dL — ABNORMAL HIGH (ref 65–99)
Potassium: 3.8 mmol/L (ref 3.5–5.1)
Sodium: 138 mmol/L (ref 135–145)

## 2016-05-26 LAB — GLUCOSE, CAPILLARY
Glucose-Capillary: 101 mg/dL — ABNORMAL HIGH (ref 65–99)
Glucose-Capillary: 117 mg/dL — ABNORMAL HIGH (ref 65–99)
Glucose-Capillary: 140 mg/dL — ABNORMAL HIGH (ref 65–99)
Glucose-Capillary: 119 mg/dL — ABNORMAL HIGH (ref 65–99)
Glucose-Capillary: 156 mg/dL — ABNORMAL HIGH (ref 65–99)
Glucose-Capillary: 102 mg/dL — ABNORMAL HIGH (ref 65–99)

## 2016-05-26 LAB — CBC
HCT: 41.4 % (ref 39.0–52.0)
Hemoglobin: 13.4 g/dL (ref 13.0–17.0)
MCH: 27.9 pg (ref 26.0–34.0)
MCHC: 32.4 g/dL (ref 30.0–36.0)
MCV: 86.3 fL (ref 78.0–100.0)
Platelets: 195 10*3/uL (ref 150–400)
RBC: 4.8 MIL/uL (ref 4.22–5.81)
RDW: 15.2 % (ref 11.5–15.5)
WBC: 8.1 10*3/uL (ref 4.0–10.5)

## 2016-05-26 NOTE — Progress Notes (Signed)
2 Days Post-Op  Subjective: Doing well, no nausea, having bowel function, ambulating well  Objective: Vital signs in last 24 hours: Temp:  [98 F (36.7 C)-98.1 F (36.7 C)] 98.1 F (36.7 C) (12/10 0459) Pulse Rate:  [79-95] 81 (12/10 0459) Resp:  [16-18] 16 (12/10 0459) BP: (131-143)/(76-95) 142/93 (12/10 0459) SpO2:  [97 %] 97 % (12/10 0459)   Intake/Output from previous day: 12/09 0701 - 12/10 0700 In: 2900 [P.O.:1200; I.V.:1700] Out: 1725 [Urine:1725] Intake/Output this shift: Total I/O In: 120 [P.O.:120] Out: -  General appearance: alert and cooperative GI: soft, non-distended  Incision: no significant drainage  Lab Results:   Recent Labs  05/25/16 0504 05/26/16 0457  WBC 9.0 8.1  HGB 14.3 13.4  HCT 44.4 41.4  PLT 191 195   BMET  Recent Labs  05/25/16 0504 05/26/16 0457  NA 138 138  K 3.8 3.8  CL 105 104  CO2 27 28  GLUCOSE 117* 112*  BUN 11 9  CREATININE 0.86 0.77  CALCIUM 8.3* 8.4*   PT/INR No results for input(s): LABPROT, INR in the last 72 hours. ABG No results for input(s): PHART, HCO3 in the last 72 hours.  Invalid input(s): PCO2, PO2  MEDS, Scheduled . enoxaparin (LOVENOX) injection  30 mg Subcutaneous Q12H  . insulin aspart  0-15 Units Subcutaneous Q4H    Studies/Results: No results found.  Assessment: s/p Procedure(s): LAPAROSCOPIC SIGMOID COLECTOMY Patient Active Problem List   Diagnosis Date Noted  . Diverticulitis of sigmoid colon 05/24/2016  . Morbid obesity ([place]) 04/25/2016  . ADD (attention deficit disorder) 12/28/2015  . Diverticulitis of large intestine with abscess without bleeding 09/13/2015  . Acute right lower quadrant pain 09/13/2015  . PCP NOTES >>>>>>>>>>>>>>>>>>>> 09/07/2015  . Diabetes type 2, uncontrolled ([place]) 10/23/2014  . Elevated WBCs 01/27/2014  . Diabetes ([place]) 02/17/2013  . Tachycardia 08/29/2011  . TOBACCO USER 06/29/2010    Expected post op course  Plan: Advance diet to soft  foods Ambulate Anticipate d/c in AM    LOS: 2 days     .Rosario Adie, [facility], [place]   05/26/2016 10:12 AM

## 2016-05-27 ENCOUNTER — Encounter (HOSPITAL_COMMUNITY): Payer: Self-pay | Admitting: General Surgery

## 2016-05-27 LAB — TYPE AND SCREEN
ABO/RH(D): A POS
Antibody Screen: NEGATIVE

## 2016-05-27 LAB — BASIC METABOLIC PANEL
Anion gap: 6 (ref 5–15)
BUN: 12 mg/dL (ref 6–20)
CO2: 27 mmol/L (ref 22–32)
Calcium: 8.6 mg/dL — ABNORMAL LOW (ref 8.9–10.3)
Chloride: 103 mmol/L (ref 101–111)
Creatinine, Ser: 0.74 mg/dL (ref 0.61–1.24)
GFR calc Af Amer: >60 mL/min (ref >60)
GFR calc non Af Amer: >60 mL/min (ref >60)
Glucose, Bld: 117 mg/dL — ABNORMAL HIGH (ref 65–99)
Potassium: 3.9 mmol/L (ref 3.5–5.1)
Sodium: 136 mmol/L (ref 135–145)

## 2016-05-27 LAB — CBC
HCT: 41.2 % (ref 39.0–52.0)
Hemoglobin: 14.2 g/dL (ref 13.0–17.0)
MCH: 28.3 pg (ref 26.0–34.0)
MCHC: 34.5 g/dL (ref 30.0–36.0)
MCV: 82.1 fL (ref 78.0–100.0)
Platelets: 205 10*3/uL (ref 150–400)
RBC: 5.02 MIL/uL (ref 4.22–5.81)
RDW: 14.8 % (ref 11.5–15.5)
WBC: 8.7 10*3/uL (ref 4.0–10.5)

## 2016-05-27 LAB — GLUCOSE, CAPILLARY
Glucose-Capillary: 105 mg/dL — ABNORMAL HIGH (ref 65–99)
Glucose-Capillary: 125 mg/dL — ABNORMAL HIGH (ref 65–99)

## 2016-05-27 MED ORDER — OXYCODONE-ACETAMINOPHEN 5-325 MG PO TABS: 1.0000 | ORAL_TABLET | Freq: Four times a day (QID) | ORAL | 0 refills | Status: DC | PRN

## 2016-05-27 NOTE — Progress Notes (Signed)
Discharge instructions and prescriptions given to patient.  Questions answered.  Vital signs stable, pain in control, patient ambulatory.  Requests to walk at discharge and not have wheelchair

## 2016-05-27 NOTE — Discharge Summary (Signed)
Physician Discharge Summary  Patient ID:  Spencer Good  MRN: [id number]  DOB/AGE: September 18, 1975 40 y.o.  Admit date: 05/24/2016 Discharge date: 05/27/2016  Discharge Diagnoses:   Active Problems:   Diverticulitis of sigmoid colon  Operation: Procedure(s): LAPAROSCOPIC SIGMOID COLECTOMY on 05/24/2016 - B. [facility]  Discharged Condition: good  Hospital Course: [name] is an 40 y.o. male whose primary care physician is Kathlene November, MD and who was admitted 05/24/2016 with a chief complaint of diverticulitis.   He was brought to the operating room on 05/24/2016 and underwent  [place].   He is now 3 days post op.  He is eating, has had a bowel movement, and is ready to go home. He is not married.  His mother will pick him up.  The discharge instructions were reviewed with the patient.  Consults: None  Significant Diagnostic Studies: Results for orders placed or performed during the hospital encounter of 05/24/16  Glucose, capillary  Result Value Ref Range   Glucose-Capillary 132 (H) 65 - 99 mg/dL  Glucose, capillary  Result Value Ref Range   Glucose-Capillary 195 (H) 65 - 99 mg/dL  Hemoglobin and hematocrit, blood  Result Value Ref Range   Hemoglobin 15.6 13.0 - 17.0 g/dL   HCT 47.7 39.0 - 52.0 %  Glucose, capillary  Result Value Ref Range   Glucose-Capillary 123 (H) 65 - 99 mg/dL  Basic metabolic panel  Result Value Ref Range   Sodium 138 135 - 145 mmol/L   Potassium 3.8 3.5 - 5.1 mmol/L   Chloride 105 101 - 111 mmol/L   CO2 27 22 - 32 mmol/L   Glucose, Bld 117 (H) 65 - 99 mg/dL   BUN 11 6 - 20 mg/dL   Creatinine, Ser 0.86 0.61 - 1.24 mg/dL   Calcium 8.3 (L) 8.9 - 10.3 mg/dL   GFR calc non Af Amer >60 >60 mL/min   GFR calc Af Amer >60 >60 mL/min   Anion gap 6 5 - 15  CBC  Result Value Ref Range   WBC 9.0 4.0 - 10.5 K/uL   RBC 5.12 4.22 - 5.81 MIL/uL   Hemoglobin 14.3 13.0 - 17.0 g/dL   HCT 44.4 39.0 - 52.0 %   MCV 86.7 78.0 - 100.0 fL   MCH 27.9 26.0 - 34.0 pg   MCHC 32.2 30.0 - 36.0 g/dL   RDW 15.5 11.5 - 15.5 %   Platelets 191 150 - 400 K/uL  Glucose, capillary  Result Value Ref Range   Glucose-Capillary 94 65 - 99 mg/dL  Glucose, capillary  Result Value Ref Range   Glucose-Capillary 104 (H) 65 - 99 mg/dL  Glucose, capillary  Result Value Ref Range   Glucose-Capillary 115 (H) 65 - 99 mg/dL  Glucose, capillary  Result Value Ref Range   Glucose-Capillary 134 (H) 65 - 99 mg/dL  Glucose, capillary  Result Value Ref Range   Glucose-Capillary 111 (H) 65 - 99 mg/dL  Glucose, capillary  Result Value Ref Range   Glucose-Capillary 84 65 - 99 mg/dL  CBC  Result Value Ref Range   WBC 8.1 4.0 - 10.5 K/uL   RBC 4.80 4.22 - 5.81 MIL/uL   Hemoglobin 13.4 13.0 - 17.0 g/dL   HCT 41.4 39.0 - 52.0 %   MCV 86.3 78.0 - 100.0 fL   MCH 27.9 26.0 - 34.0 pg   MCHC 32.4 30.0 - 36.0 g/dL   RDW 15.2 11.5 - 15.5 %   Platelets 195 150 -  400 K/uL  Basic metabolic panel  Result Value Ref Range   Sodium 138 135 - 145 mmol/L   Potassium 3.8 3.5 - 5.1 mmol/L   Chloride 104 101 - 111 mmol/L   CO2 28 22 - 32 mmol/L   Glucose, Bld 112 (H) 65 - 99 mg/dL   BUN 9 6 - 20 mg/dL   Creatinine, Ser 0.77 0.61 - 1.24 mg/dL   Calcium 8.4 (L) 8.9 - 10.3 mg/dL   GFR calc non Af Amer >60 >60 mL/min   GFR calc Af Amer >60 >60 mL/min   Anion gap 6 5 - 15  Glucose, capillary  Result Value Ref Range   Glucose-Capillary 128 (H) 65 - 99 mg/dL  Glucose, capillary  Result Value Ref Range   Glucose-Capillary 75 65 - 99 mg/dL  Glucose, capillary  Result Value Ref Range   Glucose-Capillary 101 (H) 65 - 99 mg/dL  Glucose, capillary  Result Value Ref Range   Glucose-Capillary 117 (H) 65 - 99 mg/dL  Glucose, capillary  Result Value Ref Range   Glucose-Capillary 140 (H) 65 - 99 mg/dL  CBC  Result Value Ref Range   WBC 8.7 4.0 - 10.5 K/uL   RBC 5.02 4.22 - 5.81 MIL/uL   Hemoglobin 14.2 13.0 - 17.0 g/dL   HCT 41.2 39.0 - 52.0 %   MCV 82.1 78.0 -  100.0 fL   MCH 28.3 26.0 - 34.0 pg   MCHC 34.5 30.0 - 36.0 g/dL   RDW 14.8 11.5 - 15.5 %   Platelets 205 150 - 400 K/uL  Basic metabolic panel  Result Value Ref Range   Sodium 136 135 - 145 mmol/L   Potassium 3.9 3.5 - 5.1 mmol/L   Chloride 103 101 - 111 mmol/L   CO2 27 22 - 32 mmol/L   Glucose, Bld 117 (H) 65 - 99 mg/dL   BUN 12 6 - 20 mg/dL   Creatinine, Ser 0.74 0.61 - 1.24 mg/dL   Calcium 8.6 (L) 8.9 - 10.3 mg/dL   GFR calc non Af Amer >60 >60 mL/min   GFR calc Af Amer >60 >60 mL/min   Anion gap 6 5 - 15  Glucose, capillary  Result Value Ref Range   Glucose-Capillary 119 (H) 65 - 99 mg/dL  Glucose, capillary  Result Value Ref Range   Glucose-Capillary 156 (H) 65 - 99 mg/dL  Glucose, capillary  Result Value Ref Range   Glucose-Capillary 102 (H) 65 - 99 mg/dL  Glucose, capillary  Result Value Ref Range   Glucose-Capillary 105 (H) 65 - 99 mg/dL  Glucose, capillary  Result Value Ref Range   Glucose-Capillary 125 (H) 65 - 99 mg/dL    No results found.  Discharge Exam:  Vitals:   05/26/16 2147 05/27/16 0646  BP: 138/88 (!) 144/90  Pulse: 85 86  Resp: 16 16  Temp: 98.6 F (37 C) 98.7 F (37.1 C)    General: Obese WM who is alert and generally healthy appearing.  Lungs: Clear to auscultation and symmetric breath sounds. Heart:  RRR. No murmur or rub. Abdomen: Soft. No mass. No hernia.  Few bowel sounds.  Wounds look good.  Discharge Medications:     Medication List    TAKE these medications   b complex vitamins tablet Take 1 tablet by mouth daily as needed (low energy).   canagliflozin 300 MG Tabs tablet Commonly known as:  INVOKANA Take 1 tablet (300 mg total) by mouth daily before breakfast.   lisdexamfetamine 30  MG capsule Commonly known as:  VYVANSE Take 30 mg by mouth daily.   MELATONIN PO Take 1 tablet by mouth at bedtime as needed (sleep).   metFORMIN 850 MG tablet Commonly known as:  GLUCOPHAGE Take 1 tablet (850 mg total) by mouth 3  (three) times daily before meals.   multivitamin tablet Take 1 tablet by mouth daily. Vegan Multi-Pak   oxyCODONE-acetaminophen 5-325 MG tablet Commonly known as:  PERCOCET/ROXICET Take 1-2 tablets by mouth every 6 (six) hours as needed for moderate pain.   PROBIOTIC PO Take 1 tablet by mouth daily. Taking [2] two OTC Probiotics   psyllium 58.6 % powder Commonly known as:  METAMUCIL Take 1 packet by mouth daily. Will stop prior to procedure       Disposition: 01-Home or Self Care  Discharge Instructions    Diet - low sodium heart healthy    Complete by:  As directed    Increase activity slowly    Complete by:  As directed       Return to work on:  24 Jun 2016  Activity:  Driving - May drive in 3 or 4 days, if doing well and no pain meds   Lifting - No lifting more than 15 pounds for 3 weeks, then no limit  Wound Care:   May shower today.  Remove left lower abdominal dressing tomorrow.  Diet:  As tolerated.  Follow up appointment:  Call Dr. Lear Ng office [facility]) at [phone] for any questions.  You have an appt in January 2018.  Medications and dosages:  Resume your home medications.  You have a prescription for:  Oxycodone     Signed: Alphonsa Overall, M.D., [facility] Surgery Office:  [phone]  05/27/2016, 10:59 AM

## 2016-05-27 NOTE — Discharge Instructions (Signed)
[facility] - DISCHARGE INSTRUCTIONS TO PATIENT  Return to work on:  24 Jun 2016  Activity:  Driving - May drive in 3 or 4 days, if doing well and no pain meds   Lifting - No lifting more than 15 pounds for 3 weeks, then no limit  Wound Care:   May shower today.  Remove left lower abdominal dressing tomorrow.  Diet:  As tolerated.  Follow up appointment:  Call Dr. Lear Ng office [facility]) at [phone] for any questions.  You have an appt in January 2018.  Medications and dosages:  Resume your home medications.  You have a prescription for:  Oxycodone  Call Dr. Excell Seltzer or his office  [phone]) if you have:  Temperature greater than 100.4,  Persistent nausea and vomiting,  Severe uncontrolled pain,  Redness, tenderness, or signs of infection (pain, swelling, redness, odor or green/yellow discharge around the site),  Any other questions or concerns you may have after discharge.  In an emergency, call 911 or go to an Emergency Department at a nearby hospital.

## 2016-06-03 ENCOUNTER — Telehealth: Payer: Self-pay | Admitting: Internal Medicine

## 2016-06-03 NOTE — Telephone Encounter (Signed)
Advice patient, I received a note from [facility], they notice his BP to be slightly elevated (recently started Vyvanse), recommend to check BPs twice a week, be sure   blood pressure is between 110/65 and  135/85.  if it is consistently higher or lower, let me know

## 2016-06-04 NOTE — Telephone Encounter (Signed)
MyChart message sent w/ recommendations.

## 2016-06-05 DIAGNOSIS — K5720 Diverticulitis of large intestine with perforation and abscess without bleeding: Secondary | ICD-10-CM | POA: Diagnosis not present

## 2016-06-06 ENCOUNTER — Other Ambulatory Visit: Payer: Self-pay | Admitting: General Surgery

## 2016-06-06 ENCOUNTER — Other Ambulatory Visit (HOSPITAL_COMMUNITY): Payer: Self-pay | Admitting: General Surgery

## 2016-06-06 DIAGNOSIS — K5720 Diverticulitis of large intestine with perforation and abscess without bleeding: Secondary | ICD-10-CM

## 2016-06-07 ENCOUNTER — Ambulatory Visit (HOSPITAL_COMMUNITY)
Admission: RE | Admit: 2016-06-07 | Discharge: 2016-06-07 | Disposition: A | Discharge status: Home / Self Care | Payer: BLUE CROSS/BLUE SHIELD | Source: Ambulatory Visit | Attending: General Surgery | Admitting: General Surgery

## 2016-06-07 DIAGNOSIS — I2510 Atherosclerotic heart disease of native coronary artery without angina pectoris: Secondary | ICD-10-CM | POA: Diagnosis not present

## 2016-06-07 DIAGNOSIS — K5720 Diverticulitis of large intestine with perforation and abscess without bleeding: Secondary | ICD-10-CM | POA: Diagnosis not present

## 2016-06-07 DIAGNOSIS — K6389 Other specified diseases of intestine: Secondary | ICD-10-CM | POA: Diagnosis not present

## 2016-06-07 DIAGNOSIS — K760 Fatty (change of) liver, not elsewhere classified: Secondary | ICD-10-CM | POA: Diagnosis not present

## 2016-06-07 DIAGNOSIS — I708 Atherosclerosis of other arteries: Secondary | ICD-10-CM | POA: Insufficient documentation

## 2016-06-07 DIAGNOSIS — I700 Atherosclerosis of aorta: Secondary | ICD-10-CM | POA: Insufficient documentation

## 2016-06-07 MED ORDER — IOPAMIDOL (ISOVUE-300) INJECTION 61%
100.0000 mL | Freq: Once | INTRAVENOUS | Status: AC | PRN
  Administered 2016-06-07: 100 mL via INTRAVENOUS

## 2016-06-07 MED ORDER — IOPAMIDOL (ISOVUE-300) INJECTION 61%
INTRAVENOUS | Status: AC
  Filled 2016-06-07: qty 100

## 2016-06-13 ENCOUNTER — Other Ambulatory Visit: Payer: Self-pay | Admitting: Internal Medicine

## 2016-07-24 ENCOUNTER — Telehealth: Payer: BLUE CROSS/BLUE SHIELD | Admitting: Nurse Practitioner

## 2016-07-24 DIAGNOSIS — J0100 Acute maxillary sinusitis, unspecified: Secondary | ICD-10-CM

## 2016-07-24 MED ORDER — AMOXICILLIN-POT CLAVULANATE 875-125 MG PO TABS: 1.0000 | ORAL_TABLET | Freq: Two times a day (BID) | ORAL | 0 refills | Status: DC

## 2016-07-24 NOTE — Progress Notes (Signed)

## 2016-08-16 DIAGNOSIS — G4733 Obstructive sleep apnea (adult) (pediatric): Secondary | ICD-10-CM | POA: Diagnosis not present

## 2016-08-16 DIAGNOSIS — F419 Anxiety disorder, unspecified: Secondary | ICD-10-CM | POA: Diagnosis not present

## 2016-08-16 DIAGNOSIS — Z79899 Other long term (current) drug therapy: Secondary | ICD-10-CM | POA: Diagnosis not present

## 2016-08-16 DIAGNOSIS — F902 Attention-deficit hyperactivity disorder, combined type: Secondary | ICD-10-CM | POA: Diagnosis not present

## 2016-08-19 IMAGING — CT CT IMAGE GUIDED FLUID DRAIN BY CATHETER
1 of 2 series · 14 of 32 positions shown, 18 images · non-contrast
Comparison: none

INDICATION: ENLARGING RIGHT LOWER QUADRANT DIVERTICULAR ABSCESS
TECHNIQUE: Informed written consent was obtained from the patient after a
thorough discussion of the procedural risks, benefits and
alternatives. All questions were addressed. Maximal Sterile Barrier
Technique was utilized including caps, mask, sterile gowns, sterile
gloves, sterile drape, hand hygiene and skin antiseptic. A timeout
was performed prior to the initiation of the procedure.

[Series 2: i-spiral 5.0 b40f · axial · 0.91mm/px · z∈[+832,+1154]mm · 14 of 102 slices shown, 18 images]
[im 5/102  soft-tissue]
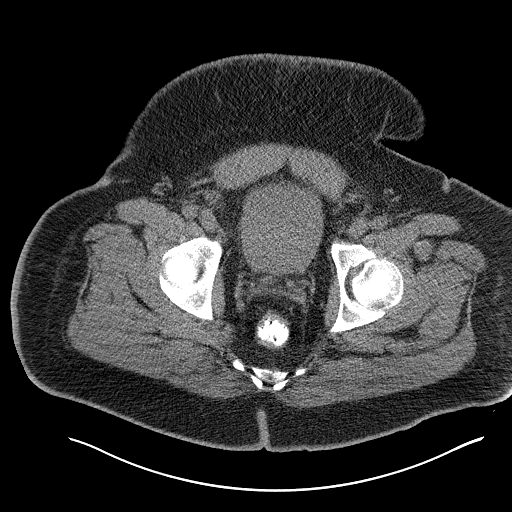
[im 5/102  bone]
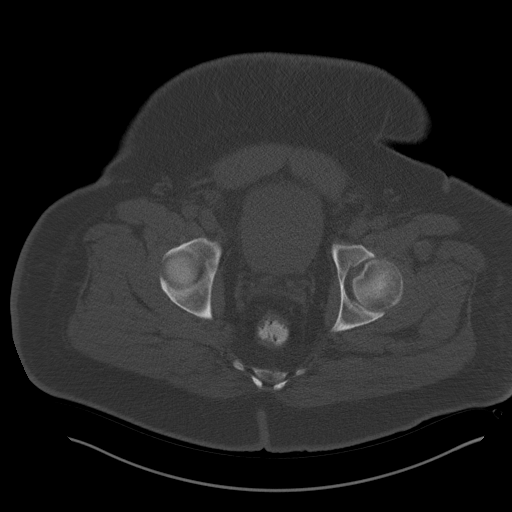
[im 13/102  soft-tissue]
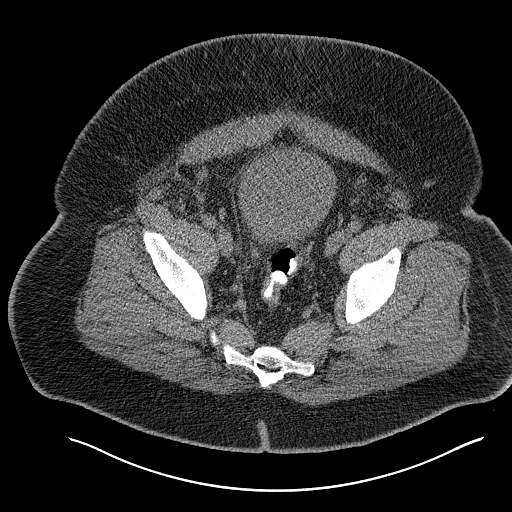
[im 22/102  soft-tissue]
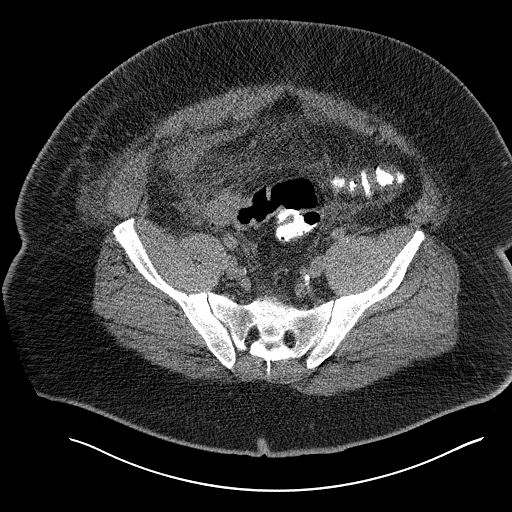
[im 30/102  soft-tissue]
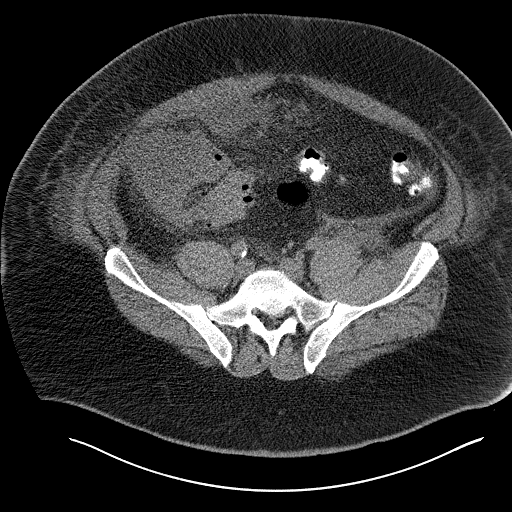
[im 38/102  soft-tissue]
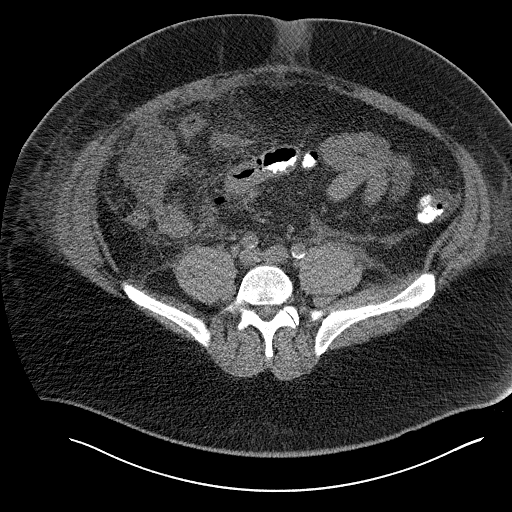
[im 47/102  soft-tissue]
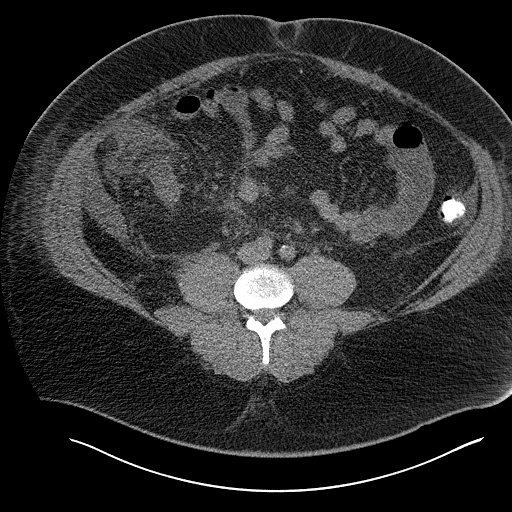
[im 55/102  soft-tissue]
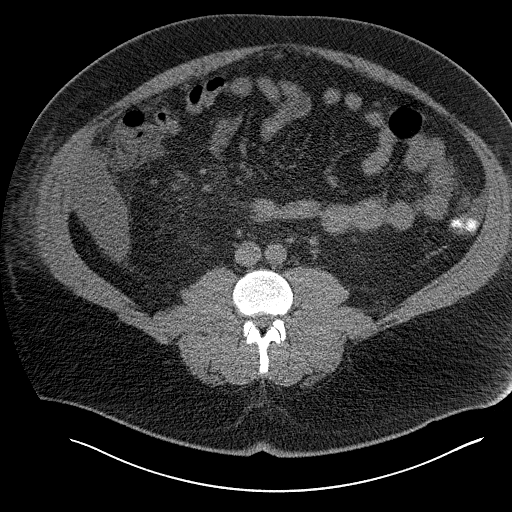
[im 64/102  soft-tissue]
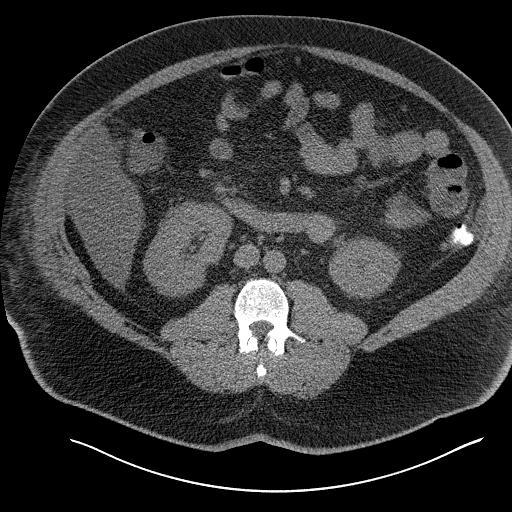
[im 72/102  soft-tissue]
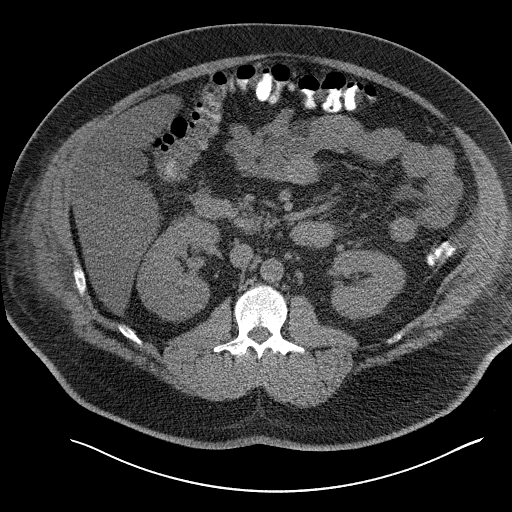
[im 72/102  bone]
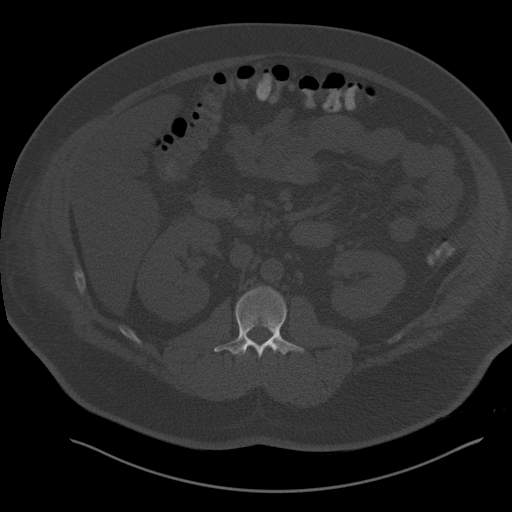
[im 80/102  soft-tissue]
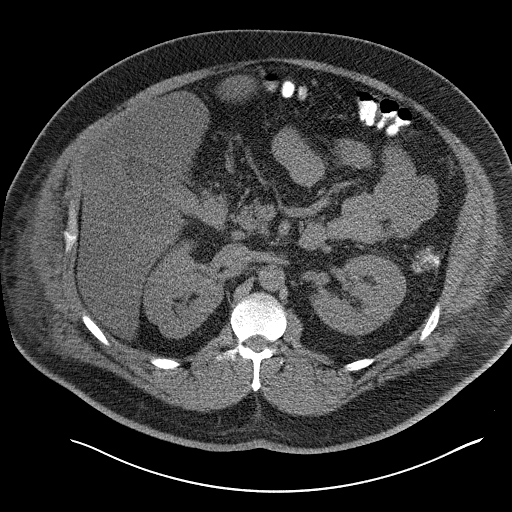
[im 85/102  lung]
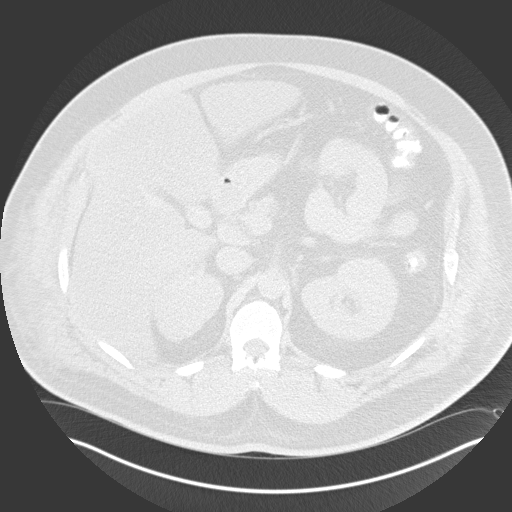
[im 89/102  soft-tissue]
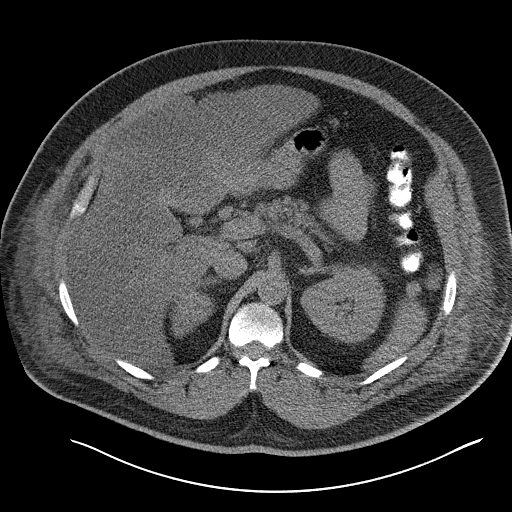
[im 89/102  lung]
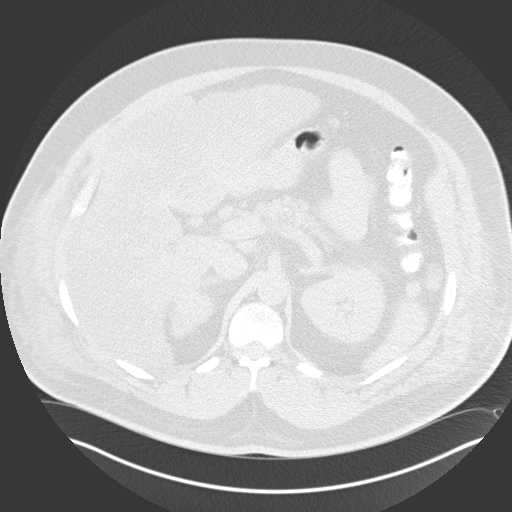
[im 93/102  lung]
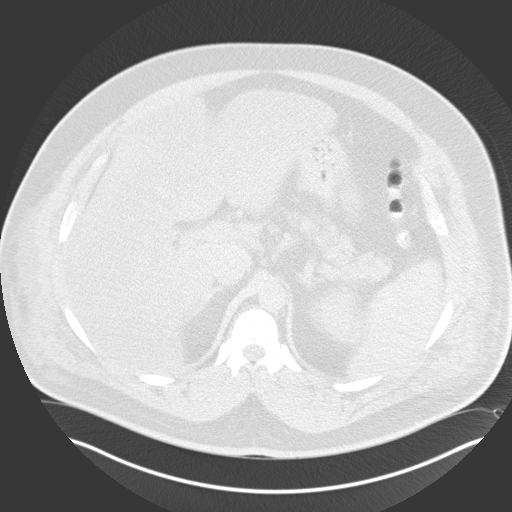
[im 97/102  soft-tissue]
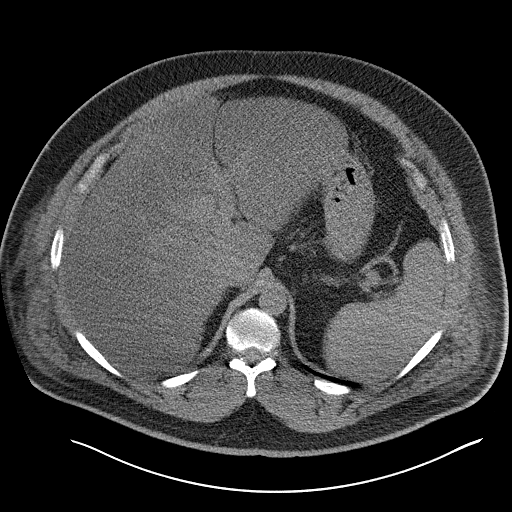
[im 97/102  lung]
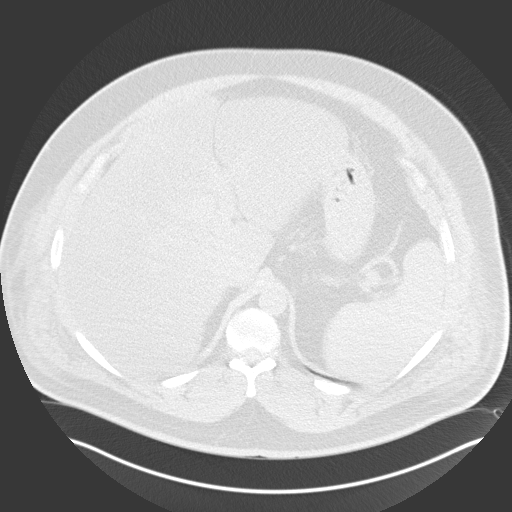

[14 of 32 positions shown; findings below may reference images not displayed]

EXAM:
CT GUIDED DRAINAGE OF RIGHT LOWER QUADRANT DIVERTICULAR ABSCESS

MEDICATIONS:
The patient is currently admitted to the hospital and receiving
intravenous antibiotics. The antibiotics were administered within an
appropriate time frame prior to the initiation of the procedure.

ANESTHESIA/SEDATION:
2.0 mg IV Versed 200 mcg IV Fentanyl

Moderate Sedation Time:  15 minutes

The patient was continuously monitored during the procedure by the
interventional radiology nurse under my direct supervision.

COMPLICATIONS:
None immediate.
PROCEDURE:
The right lower quadrant was prepped with ChloraPrep in a sterile
fashion, and a sterile drape was applied covering the operative
field. A sterile gown and sterile gloves were used for the
procedure. Local anesthesia was provided with 1% Lidocaine.

Previous imaging reviewed. Patient positioned supine. Noncontrast
localization CT performed. The right lower quadrant abscess was
localized. Under sterile conditions and local anesthesia, an 18
gauge 15 cm access needle was advanced percutaneously from a right
anterior oblique approach. Needle position confirmed in the fluid
collection with CT. Syringe aspiration yielded purulent fluid
compatible with abscess. Sample sent for Gram stain and culture.
Guidewire inserted followed by tract dilatation to insert a 10
French drain. Retention loop formed in the abscess. Position
confirmed with CT. Catheter secured with a Prolene suture and
connected to external suction bulb followed by sterile dressing. No
immediate complication. Patient tolerated the procedure well.
FINDINGS: CT imaging confirms needle access of the right lower quadrant
abscess for drain insertion.
IMPRESSION: Successful CT-guided right lower quadrant abscess drain insertion.
Purulent fluid aspirated. Gram stain and culture sent.

## 2016-08-21 ENCOUNTER — Other Ambulatory Visit: Payer: Self-pay | Admitting: Internal Medicine

## 2016-08-23 ENCOUNTER — Ambulatory Visit: Payer: BLUE CROSS/BLUE SHIELD | Admitting: Internal Medicine

## 2016-09-25 ENCOUNTER — Telehealth: Payer: Self-pay | Admitting: Internal Medicine

## 2016-09-25 MED ORDER — METFORMIN HCL 850 MG PO TABS: 850.0000 mg | ORAL_TABLET | Freq: Three times a day (TID) | ORAL | 0 refills | Status: DC

## 2016-09-25 NOTE — Telephone Encounter (Signed)
Caller name: Relationship to patient: Self Can be reached: [phone]  Pharmacy:  [place], [place] - [address] AT [place] [phone] (Phone) [phone] (Fax)     Reason for call: Refill metFORMIN (GLUCOPHAGE) 850 MG tablet  To last until appt

## 2016-09-25 NOTE — Telephone Encounter (Signed)
Rx sent 

## 2016-10-14 ENCOUNTER — Encounter: Payer: Self-pay | Admitting: Internal Medicine

## 2016-10-14 ENCOUNTER — Ambulatory Visit (INDEPENDENT_AMBULATORY_CARE_PROVIDER_SITE_OTHER): Payer: BLUE CROSS/BLUE SHIELD | Admitting: Internal Medicine

## 2016-10-14 VITALS — BP 132/76 | HR 103 | Temp 97.6°F | Resp 14 | Ht 72.0 in | Wt 340.2 lb

## 2016-10-14 DIAGNOSIS — M109 Gout, unspecified: Secondary | ICD-10-CM

## 2016-10-14 DIAGNOSIS — E1165 Type 2 diabetes mellitus with hyperglycemia: Secondary | ICD-10-CM

## 2016-10-14 DIAGNOSIS — D72829 Elevated white blood cell count, unspecified: Secondary | ICD-10-CM

## 2016-10-14 DIAGNOSIS — E1169 Type 2 diabetes mellitus with other specified complication: Secondary | ICD-10-CM | POA: Diagnosis not present

## 2016-10-14 DIAGNOSIS — IMO0002 Reserved for concepts with insufficient information to code with codable children: Secondary | ICD-10-CM

## 2016-10-14 DIAGNOSIS — F17200 Nicotine dependence, unspecified, uncomplicated: Secondary | ICD-10-CM | POA: Diagnosis not present

## 2016-10-14 MED ORDER — VARENICLINE TARTRATE 1 MG PO TABS: 1.0000 mg | ORAL_TABLET | Freq: Two times a day (BID) | ORAL | 1 refills | Status: DC

## 2016-10-14 MED ORDER — VARENICLINE TARTRATE 0.5 MG X 11 & 1 MG X 42 PO MISC: ORAL | 0 refills | Status: DC

## 2016-10-14 NOTE — Patient Instructions (Signed)
GO TO THE LAB : Get the blood work     GO TO THE FRONT DESK Schedule your next appointment for a  checkup in 3 months  Start Chantix, quit 2 weeks after you start

## 2016-10-14 NOTE — Progress Notes (Signed)
Subjective:    Patient ID: Spencer Good, male    DOB: Aug 15, 1975, 41 y.o.   MRN: [id number]  DOS:  10/14/2016 Type of visit - description : rov Interval history: DM: Good medication compliance, diet has improved, but he said scales he has lost 20 pounds since December. In our scales is 10 pounds HTN: Good medication compliance, normal ambulatory BPs Still smoking, Chantix again? He is trying nicotine in December with no success.   Wt Readings from Last 3 Encounters:  10/14/16 (!) 340 lb 4 oz (154.3 kg)  05/24/16 (!) 350 lb (158.8 kg)  05/21/16 (!) 350 lb (158.8 kg)     Review of Systems No recent unusual aches and pains, but would like uric acid check admits to a lot of stress, work related. no depression  Past Medical History:  Diagnosis Date  . ADHD (attention deficit hyperactivity disorder)   . Diabetes ([place])   . Diverticulitis of colon with perforation    s/p sigmoid colectomy  . Gout   . Hernia, umbilical 9675   Laparoscopic umbilical hernia repair with mesh- s/p repair-resolved.  Marland Kitchen History of colon polyps   . Insomnia   . Obesity   . OSA (obstructive sleep apnea) dx 05-2013   Rx a Cpap 3-15, can't use - no longer has machine.    Past Surgical History:  Procedure Laterality Date  . INSERTION OF MESH N/A 02/01/2015   Procedure: INSERTION OF MESH;  Surgeon: Rolm Bookbinder, MD;  Location: [facility];  Service: General;  Laterality: N/A;  . LAPAROSCOPIC SIGMOID COLECTOMY N/A 05/24/2016   Procedure: LAPAROSCOPIC SIGMOID COLECTOMY;  Surgeon: Excell Seltzer, MD;  Location: [facility];  Service: General;  Laterality: N/A;  . LASIK Bilateral   . UMBILICAL HERNIA REPAIR N/A 02/01/2015   Procedure: LAPAROSCOPIC UMBILICAL HERNIA REPAIR WITH MESH;  Surgeon: Rolm Bookbinder, MD;  Location: [facility];  Service: General;  Laterality: N/A;    Social History   Social History  . Marital status: Single    Spouse name: N/A  . Number of children: 0  . Years of education: N/A    Occupational History  . [employer] History Main Topics  . Smoking status: Former Smoker    Packs/day: 1.00    Years: 20.00    Types: Cigarettes    Start date: 07/30/1992    Quit date: 12/16/2014  . Smokeless tobacco: Never Used     Comment: on/off  . Alcohol use 0.0 oz/week     Comment: 09/08/2015 "I'll have a few drinks/year"  . Drug use: No     Comment: Past  experimented some in college; not a regular user when I did use-none over 15 yrs  . Sexual activity: Not Currently   Other Topics Concern  . Not on file   Social History Narrative   Lives by himself       Allergies as of 10/14/2016   No Known Allergies     Medication List       Accurate as of 10/14/16 11:59 PM. Always use your most recent med list.          b complex vitamins tablet Take 1 tablet by mouth daily as needed (low energy).   canagliflozin 300 MG Tabs tablet Commonly known as:  INVOKANA Take 1 tablet (300 mg total) by mouth daily before breakfast.   MELATONIN PO Take 1 tablet by mouth at bedtime as needed (sleep).   metFORMIN  850 MG tablet Commonly known as:  GLUCOPHAGE Take 1 tablet (850 mg total) by mouth 3 (three) times daily with meals.   multivitamin tablet Take 1 tablet by mouth daily. Vegan Multi-Pak   MYDAYIS 50 MG Cp24 Generic drug:  Amphet-Dextroamphet 3-Bead ER Take 50 mg by mouth daily.   PROBIOTIC PO Take 1 tablet by mouth daily. Taking [2] two OTC Probiotics   psyllium 58.6 % powder Commonly known as:  METAMUCIL Take 1 packet by mouth daily. Will stop prior to procedure   varenicline 1 MG tablet Commonly known as:  CHANTIX Take 1 tablet (1 mg total) by mouth 2 (two) times daily.   varenicline 0.5 MG X 11 & 1 MG X 42 tablet Commonly known as:  CHANTIX STARTING MONTH PAK Take one 0.5 mg tablet by mouth once daily for 3 days, then increase to one 0.5 mg tablet twice daily for 4 days, then increase to one 1 mg tablet  twice daily.          Objective:   Physical Exam BP 132/76 (BP Location: Left Arm, Patient Position: Sitting, Cuff Size: Normal)   Pulse (!) 103   Temp 97.6 F (36.4 C) (Oral)   Resp 14   Ht 6' (1.829 m)   Wt (!) 340 lb 4 oz (154.3 kg)   SpO2 98%   BMI 46.15 kg/m  General:   Well developed, obese appearing . NAD.  HEENT:  Normocephalic . Face symmetric, atraumatic Lungs:  CTA B Normal respiratory effort, no intercostal retractions, no accessory muscle use. Heart: RRR,  no murmur.  No pretibial edema bilaterally  Skin: Not pale. Not jaundice Neurologic:  alert & oriented X3.  Speech normal, gait appropriate for age and unassisted Psych--  Cognition and judgment appear intact.  Cooperative with normal attention span and concentration.  Behavior appropriate. No anxious or depressed appearing.      Assessment & Plan:    Assessment DM-- dx 2014, took invokana, metformin, dc ~06-2014 as diet improved, a1c 13 ( 08-2015), meds restarted Gout Morbid obesity OSA- intolerant to cpap ADHD- per other provider  Insomnia H/o diverticulitis, sigmoid,w/ perf, conservative Rx 10-2014 , Admitted again 08/2015, abscess, s/p drainage. Tobacco- wellbutrin intolerant   PLAN: DM: On invokana- metformin, has lost weight, 10 pounds per our scales, more per his own scales. Encouraged to continue in that path, he is doing better with diet. Checking a A1c and CMP Gout: No recent episodes, request a uric acid test. We will do Morbid obesity: Long discussion about diet. He is doing better. Tobacco abuse: He is intolerant to Wellbutrin, he try nicotine in December, didn't help. Would like to repeat Chantix which helped before. Prescription provided. Counseled. Stress at work: Counseled as well. RTC 3 months

## 2016-10-14 NOTE — Progress Notes (Signed)
Pre visit review using our clinic review tool, if applicable. No additional management support is needed unless otherwise documented below in the visit note. 

## 2016-10-15 LAB — URIC ACID: Uric Acid, Serum: 6.3 mg/dL (ref 4.0–7.8)

## 2016-10-15 LAB — CBC WITH DIFFERENTIAL/PLATELET
Basophils Absolute: 0 10*3/uL (ref 0.0–0.1)
Basophils Relative: 0.5 % (ref 0.0–3.0)
Eosinophils Absolute: 0.3 10*3/uL (ref 0.0–0.7)
Eosinophils Relative: 3.2 % (ref 0.0–5.0)
HCT: 46.8 % (ref 39.0–52.0)
Hemoglobin: 15.8 g/dL (ref 13.0–17.0)
Lymphocytes Relative: 39.3 % (ref 12.0–46.0)
Lymphs Abs: 4.2 10*3/uL — ABNORMAL HIGH (ref 0.7–4.0)
MCHC: 33.7 g/dL (ref 30.0–36.0)
MCV: 82.8 fl (ref 78.0–100.0)
Monocytes Absolute: 0.8 10*3/uL (ref 0.1–1.0)
Monocytes Relative: 7.9 % (ref 3.0–12.0)
Neutro Abs: 5.2 10*3/uL (ref 1.4–7.7)
Neutrophils Relative %: 49.1 % (ref 43.0–77.0)
Platelets: 246 10*3/uL (ref 150.0–400.0)
RBC: 5.65 Mil/uL (ref 4.22–5.81)
RDW: 15.4 % (ref 11.5–15.5)
WBC: 10.6 10*3/uL — ABNORMAL HIGH (ref 4.0–10.5)

## 2016-10-15 LAB — COMPREHENSIVE METABOLIC PANEL
ALT: 27 U/L (ref 0–53)
AST: 18 U/L (ref 0–37)
Albumin: 4.2 g/dL (ref 3.5–5.2)
Alkaline Phosphatase: 76 U/L (ref 39–117)
BUN: 13 mg/dL (ref 6–23)
CO2: 27 mEq/L (ref 19–32)
Calcium: 9.5 mg/dL (ref 8.4–10.5)
Chloride: 101 mEq/L (ref 96–112)
Creatinine, Ser: 0.98 mg/dL (ref 0.40–1.50)
GFR: 90.65 mL/min (ref >60.00)
Glucose, Bld: 101 mg/dL — ABNORMAL HIGH (ref 70–99)
Potassium: 4.3 mEq/L (ref 3.5–5.1)
Sodium: 135 mEq/L (ref 135–145)
Total Bilirubin: 0.4 mg/dL (ref 0.2–1.2)
Total Protein: 7.2 g/dL (ref 6.0–8.3)

## 2016-10-15 LAB — HEMOGLOBIN A1C: Hgb A1c MFr Bld: 7.2 % — ABNORMAL HIGH (ref 4.6–6.5)

## 2016-10-16 NOTE — Assessment & Plan Note (Signed)
DM: On invokana- metformin, has lost weight, 10 pounds per our scales, more per his own scales. Encouraged to continue in that path, he is doing better with diet. Checking a A1c and CMP Gout: No recent episodes, request a uric acid test. We will do Morbid obesity: Long discussion about diet. He is doing better. Tobacco abuse: He is intolerant to Wellbutrin, he try nicotine in December, didn't help. Would like to repeat Chantix which helped before. Prescription provided. Counseled. Stress at work: Counseled as well. RTC 3 months

## 2016-10-27 ENCOUNTER — Other Ambulatory Visit: Payer: Self-pay | Admitting: Internal Medicine

## 2016-11-14 DIAGNOSIS — G4733 Obstructive sleep apnea (adult) (pediatric): Secondary | ICD-10-CM | POA: Diagnosis not present

## 2016-11-14 DIAGNOSIS — Z79899 Other long term (current) drug therapy: Secondary | ICD-10-CM | POA: Diagnosis not present

## 2016-11-14 DIAGNOSIS — F419 Anxiety disorder, unspecified: Secondary | ICD-10-CM | POA: Diagnosis not present

## 2016-11-14 DIAGNOSIS — F902 Attention-deficit hyperactivity disorder, combined type: Secondary | ICD-10-CM | POA: Diagnosis not present

## 2017-01-16 ENCOUNTER — Ambulatory Visit: Payer: BLUE CROSS/BLUE SHIELD | Admitting: Internal Medicine

## 2017-02-12 DIAGNOSIS — G4733 Obstructive sleep apnea (adult) (pediatric): Secondary | ICD-10-CM | POA: Diagnosis not present

## 2017-02-12 DIAGNOSIS — F902 Attention-deficit hyperactivity disorder, combined type: Secondary | ICD-10-CM | POA: Diagnosis not present

## 2017-02-12 DIAGNOSIS — Z79899 Other long term (current) drug therapy: Secondary | ICD-10-CM | POA: Diagnosis not present

## 2017-02-12 DIAGNOSIS — F419 Anxiety disorder, unspecified: Secondary | ICD-10-CM | POA: Diagnosis not present

## 2017-04-23 ENCOUNTER — Encounter: Payer: Self-pay | Admitting: Internal Medicine

## 2017-04-23 ENCOUNTER — Ambulatory Visit (INDEPENDENT_AMBULATORY_CARE_PROVIDER_SITE_OTHER): Payer: BLUE CROSS/BLUE SHIELD | Admitting: Internal Medicine

## 2017-04-23 VITALS — BP 138/82 | HR 111 | Temp 97.8°F | Resp 14 | Ht 72.0 in | Wt 354.1 lb

## 2017-04-23 DIAGNOSIS — R000 Tachycardia, unspecified: Secondary | ICD-10-CM

## 2017-04-23 DIAGNOSIS — Z23 Encounter for immunization: Secondary | ICD-10-CM | POA: Diagnosis not present

## 2017-04-23 DIAGNOSIS — E1165 Type 2 diabetes mellitus with hyperglycemia: Secondary | ICD-10-CM

## 2017-04-23 LAB — MICROALBUMIN / CREATININE URINE RATIO
Creatinine,U: 92.5 mg/dL
Microalb Creat Ratio: 1.1 mg/g (ref 0.0–30.0)
Microalb, Ur: 1 mg/dL (ref 0.0–1.9)

## 2017-04-23 LAB — BASIC METABOLIC PANEL
BUN: 19 mg/dL (ref 6–23)
CO2: 27 mEq/L (ref 19–32)
Calcium: 9.7 mg/dL (ref 8.4–10.5)
Chloride: 100 mEq/L (ref 96–112)
Creatinine, Ser: 1.06 mg/dL (ref 0.40–1.50)
GFR: 82.58 mL/min (ref >60.00)
Glucose, Bld: 147 mg/dL — ABNORMAL HIGH (ref 70–99)
Potassium: 4.2 mEq/L (ref 3.5–5.1)
Sodium: 136 mEq/L (ref 135–145)

## 2017-04-23 LAB — LIPID PANEL
Cholesterol: 226 mg/dL — ABNORMAL HIGH (ref 0–200)
HDL: 27.5 mg/dL — ABNORMAL LOW (ref >39.00)
Total CHOL/HDL Ratio: 8
Triglycerides: 1135 mg/dL — ABNORMAL HIGH (ref 0.0–149.0)

## 2017-04-23 LAB — HEMOGLOBIN A1C: Hgb A1c MFr Bld: 8.1 % — ABNORMAL HIGH (ref 4.6–6.5)

## 2017-04-23 LAB — TSH: TSH: 3.5 u[IU]/mL (ref 0.35–4.50)

## 2017-04-23 LAB — LDL CHOLESTEROL, DIRECT: Direct LDL: 77 mg/dL

## 2017-04-23 NOTE — Patient Instructions (Signed)
GO TO THE LAB : Get the blood work     GO TO THE FRONT DESK Schedule your next appointment for a  Check up in 3 months  

## 2017-04-23 NOTE — Progress Notes (Signed)
Pre visit review using our clinic review tool, if applicable. No additional management support is needed unless otherwise documented below in the visit note. 

## 2017-04-23 NOTE — Assessment & Plan Note (Signed)
DM: Currently on Invokana, metformin.  Has gained weight likely d/t quitting tobacco.  Rec  to start working on diet, exercise.  Check a BMP, A1c, FLP and micro. Feet exam (-), rec eye exams Morbid obesity: Gained significant amount of weight  likely due to quitting tobacco, encouraged to go back to healthy diet. OSA: Untreated, c/o lack of energy, also very stressed at work. Tobacco: Quit with Chantix.  Praised!. Mild tachycardia: Asx, check a TSH.  No history of anemia.   primary care: flu shot today RTC 3 months

## 2017-04-23 NOTE — Progress Notes (Signed)
Subjective:    Patient ID: Spencer Good, male    DOB: Jul 25, 1975, 41 y.o.   MRN: [id number]  DOS:  04/23/2017 Type of visit - description : rov Interval history: Since the last visit, he quit tobacco with Chantix. DM: Good compliant with medication + Stress: Work related, also very concerned about the political  environment. Mild tachycardia noted, patient stated that election day was yesterday and he is quite excited about it.  Wt Readings from Last 3 Encounters:  04/23/17 (!) 354 lb 2 oz (160.6 kg)  10/14/16 (!) 340 lb 4 oz (154.3 kg)  05/24/16 (!) 350 lb (158.8 kg)     Review of Systems Denies chest pain or difficulty breathing Still tired, history of CPAP intolerance  Other than above, a 14 point review of systems is negative     Past Medical History:  Diagnosis Date  . ADHD (attention deficit hyperactivity disorder)   . Diabetes ([place])   . Diverticulitis of colon with perforation    s/p sigmoid colectomy  . Gout   . Hernia, umbilical 0254   Laparoscopic umbilical hernia repair with mesh- s/p repair-resolved.  Marland Kitchen History of colon polyps   . Insomnia   . Obesity   . OSA (obstructive sleep apnea) dx 05-2013   Rx a Cpap 3-15, can't use - no longer has machine.    Past Surgical History:  Procedure Laterality Date  . LASIK Bilateral     Social History   Socioeconomic History  . Marital status: Single    Spouse name: Not on file  . Number of children: 0  . Years of education: Not on file  . Highest education level: Not on file  Social Needs  . Financial resource strain: Not on file  . Food insecurity - worry: Not on file  . Food insecurity - inability: Not on file  . Transportation needs - medical: Not on file  . Transportation needs - non-medical: Not on file  Occupational History  . Occupation: Agricultural engineer - Building services engineer: [employer]  Tobacco Use  . Smoking status: Former Smoker    Packs/day: 1.00    Years: 20.00    Pack  years: 20.00    Types: Cigarettes    Start date: 07/30/1992    Last attempt to quit: 12/16/2014    Years since quitting: 2.3  . Smokeless tobacco: Never Used  . Tobacco comment: on/off  Substance and Sexual Activity  . Alcohol use: Yes    Alcohol/week: 0.0 oz    Comment: 09/08/2015 "I'll have a few drinks/year"  . Drug use: No    Comment: Past  experimented some in college; not a regular user when I did use-none over 15 yrs  . Sexual activity: Not Currently  Other Topics Concern  . Not on file  Social History Narrative   Lives by himself      Family History  Problem Relation Age of Onset  . Stroke Father        F, PGF  . CAD Other        MGF  . Heart disease Maternal Grandfather   . Colon cancer Neg Hx   . Prostate cancer Neg Hx   . Diabetes Neg Hx        distant fam members  . Colon polyps Neg Hx   . Esophageal cancer Neg Hx   . Gallbladder disease Neg Hx   . Kidney disease Neg Hx  Allergies as of 04/23/2017   No Known Allergies     Medication List        Accurate as of 04/23/17  8:17 PM. Always use your most recent med list.          canagliflozin 300 MG Tabs tablet Commonly known as:  INVOKANA Take 1 tablet (300 mg total) by mouth daily before breakfast.   MELATONIN PO Take 1 tablet by mouth at bedtime as needed (sleep).   metFORMIN 850 MG tablet Commonly known as:  GLUCOPHAGE Take 1 tablet (850 mg total) by mouth 3 (three) times daily with meals.   multivitamin tablet Take 1 tablet by mouth daily. Vegan Multi-Pak   MYDAYIS 50 MG Cp24 Generic drug:  Amphet-Dextroamphet 3-Bead ER Take 50 mg by mouth daily.   PROBIOTIC PO Take 1 tablet by mouth daily. Taking [2] two OTC Probiotics   psyllium 58.6 % powder Commonly known as:  METAMUCIL Take 1 packet daily by mouth.          Objective:   Physical Exam BP 138/82 (BP Location: Left Arm, Patient Position: Sitting, Cuff Size: Normal)   Pulse (!) 111   Temp 97.8 F (36.6 C) (Oral)   Resp 14    Ht 6' (1.829 m)   Wt (!) 354 lb 2 oz (160.6 kg)   SpO2 97%   BMI 48.03 kg/m  General:   Well developed, morbidly obese, NAD.  HEENT:  Normocephalic . Face symmetric, atraumatic Lungs:  CTA B Normal respiratory effort, no intercostal retractions, no accessory muscle use. Heart: RRR,  no murmur.  No pretibial edema bilaterally  Skin: Not pale. Not jaundice DIABETIC FEET EXAM: No lower extremity edema Normal pedal pulses bilaterally Skin normal, nails normal, no calluses Pinprick examination of the feet normal.  Neurologic:  alert & oriented X3.  Speech normal, gait appropriate for age and unassisted Psych--  Cognition and judgment appear intact.  Cooperative with normal attention span and concentration.  Behavior appropriate. No anxious or depressed appearing.      Assessment & Plan:   Assessment DM-- dx 2014, took invokana, metformin, dc ~06-2014 as diet improved, a1c 13 ( 08-2015), meds restarted Gout Morbid obesity OSA- intolerant to cpap ADHD- per other provider  Insomnia H/o diverticulitis, sigmoid,w/ perf, conservative Rx 10-2014 , Admitted again 08/2015, abscess, s/p drainage. Tobacco- wellbutrin intolerant   PLAN: DM: Currently on Invokana, metformin.  Has gained weight likely d/t quitting tobacco.  Rec  to start working on diet, exercise.  Check a BMP, A1c, FLP and micro. Feet exam (-), rec eye exams Morbid obesity: Gained significant amount of weight  likely due to quitting tobacco, encouraged to go back to healthy diet. OSA: Untreated, c/o lack of energy, also very stressed at work. Tobacco: Quit with Chantix.  Praised!. Mild tachycardia: Asx, check a TSH.  No history of anemia.   primary care: flu shot today RTC 3 months

## 2017-04-25 ENCOUNTER — Other Ambulatory Visit: Payer: Self-pay | Admitting: Internal Medicine

## 2017-04-25 MED ORDER — FENOFIBRATE 160 MG PO TABS: 160.0000 mg | ORAL_TABLET | Freq: Every day | ORAL | 3 refills | Status: DC

## 2017-04-25 MED ORDER — SITAGLIPTIN PHOSPHATE 100 MG PO TABS: 100.0000 mg | ORAL_TABLET | Freq: Every day | ORAL | 3 refills | Status: DC

## 2017-04-25 NOTE — Addendum Note (Signed)
Addended byDamita Dunnings D on: 04/25/2017 02:08 PM   Modules accepted: Orders

## 2017-06-06 DIAGNOSIS — Z79899 Other long term (current) drug therapy: Secondary | ICD-10-CM | POA: Diagnosis not present

## 2017-06-06 DIAGNOSIS — F902 Attention-deficit hyperactivity disorder, combined type: Secondary | ICD-10-CM | POA: Diagnosis not present

## 2017-07-16 DIAGNOSIS — J309 Allergic rhinitis, unspecified: Secondary | ICD-10-CM | POA: Diagnosis not present

## 2017-07-16 DIAGNOSIS — J019 Acute sinusitis, unspecified: Secondary | ICD-10-CM | POA: Diagnosis not present

## 2017-08-21 ENCOUNTER — Other Ambulatory Visit: Payer: Self-pay | Admitting: Internal Medicine

## 2017-08-29 ENCOUNTER — Ambulatory Visit (INDEPENDENT_AMBULATORY_CARE_PROVIDER_SITE_OTHER): Payer: BLUE CROSS/BLUE SHIELD | Admitting: Internal Medicine

## 2017-08-29 ENCOUNTER — Encounter: Payer: Self-pay | Admitting: Internal Medicine

## 2017-08-29 VITALS — BP 138/84 | HR 94 | Temp 98.2°F | Resp 14 | Ht 72.0 in | Wt 341.1 lb

## 2017-08-29 DIAGNOSIS — E785 Hyperlipidemia, unspecified: Secondary | ICD-10-CM

## 2017-08-29 DIAGNOSIS — Z114 Encounter for screening for human immunodeficiency virus [HIV]: Secondary | ICD-10-CM | POA: Diagnosis not present

## 2017-08-29 DIAGNOSIS — E1165 Type 2 diabetes mellitus with hyperglycemia: Secondary | ICD-10-CM

## 2017-08-29 LAB — LIPID PANEL
Cholesterol: 191 mg/dL (ref 0–200)
HDL: 28.7 mg/dL — ABNORMAL LOW (ref >39.00)
NonHDL: 161.8
Total CHOL/HDL Ratio: 7
Triglycerides: 267 mg/dL — ABNORMAL HIGH (ref 0.0–149.0)
VLDL: 53.4 mg/dL — ABNORMAL HIGH (ref 0.0–40.0)

## 2017-08-29 LAB — LDL CHOLESTEROL, DIRECT: Direct LDL: 132 mg/dL

## 2017-08-29 LAB — ALT: ALT: 35 U/L (ref 0–53)

## 2017-08-29 LAB — HEMOGLOBIN A1C: Hgb A1c MFr Bld: 7.4 % — ABNORMAL HIGH (ref 4.6–6.5)

## 2017-08-29 LAB — AST: AST: 19 U/L (ref 0–37)

## 2017-08-29 NOTE — Assessment & Plan Note (Signed)
DM: Last A1c 8.1, we added Januvia,  Also takes Invokana and metformin.  Doing excellent with diet, has lost several pounds, praised!  Declined eye referral at this point.  Will check A1c.  Encourage increased physical activity High cholesterol: based on last FLP, added fenofibrate, checking labs Morbid obesity: Eating healthier, has lost about 13 pounds! Tobacco:  not to smoking, uses e- cigarette sporadically. Preventive care, check a HIV RTC 4 months

## 2017-08-29 NOTE — Progress Notes (Signed)
Pre visit review using our clinic review tool, if applicable. No additional management support is needed unless otherwise documented below in the visit note. 

## 2017-08-29 NOTE — Patient Instructions (Signed)
GO TO THE LAB : Get the blood work     GO TO THE FRONT DESK Schedule your next appointment for a checkup in 4 months   Start aspirin 81 mg daily, stop if you see a lot of bruising or any unusual bleeding  You are doing great, continue with your new way to eat.  Try to stay active 30 minutes daily

## 2017-08-29 NOTE — Progress Notes (Signed)
Subjective:    Patient ID: Spencer Good, male    DOB: 11-Apr-1976, 42 y.o.   MRN: [id number]  DOS:  08/29/2017 Type of visit - description : f/u Interval history: Since the last office visit, A1c was elevated, Januvia was added. Dyslipidemia: Based on last labs, fenofibrate was initiated. He remains tobacco free although from time to times uses a electronic cigarette.  Wt Readings from Last 3 Encounters:  08/29/17 (!) 341 lb 2 oz (154.7 kg)  04/23/17 (!) 354 lb 2 oz (160.6 kg)  10/14/16 (!) 340 lb 4 oz (154.3 kg)     Review of Systems Some stress without actual depression or anxiety.   Past Medical History:  Diagnosis Date  . ADHD (attention deficit hyperactivity disorder)   . Diabetes ([place])   . Diverticulitis of colon with perforation    s/p sigmoid colectomy  . Gout   . Hernia, umbilical 8938   Laparoscopic umbilical hernia repair with mesh- s/p repair-resolved.  Marland Kitchen History of colon polyps   . Insomnia   . Obesity   . OSA (obstructive sleep apnea) dx 05-2013   Rx a Cpap 3-15, can't use - no longer has machine.    Past Surgical History:  Procedure Laterality Date  . INSERTION OF MESH N/A 02/01/2015   Procedure: INSERTION OF MESH;  Surgeon: Rolm Bookbinder, MD;  Location: [facility];  Service: General;  Laterality: N/A;  . LAPAROSCOPIC SIGMOID COLECTOMY N/A 05/24/2016   Procedure: LAPAROSCOPIC SIGMOID COLECTOMY;  Surgeon: Excell Seltzer, MD;  [facility];  Service: General;  Laterality: N/A;  . LASIK Bilateral   . UMBILICAL HERNIA REPAIR N/A 02/01/2015   Procedure: LAPAROSCOPIC UMBILICAL HERNIA REPAIR WITH MESH;  Surgeon: Rolm Bookbinder, MD;  Location: [facility];  Service: General;  Laterality: N/A;    Social History   Socioeconomic History  . Marital status: Single    Spouse name: Not on file  . Number of children: 0  . Years of education: Not on file  . Highest education level: Not on file  Social Needs  . Financial resource strain: Not on file  . Food  insecurity - worry: Not on file  . Food insecurity - inability: Not on file  . Transportation needs - medical: Not on file  . Transportation needs - non-medical: Not on file  Occupational History  . Occupation: Agricultural engineer - Building services engineer: [employer]  Tobacco Use  . Smoking status: Former Smoker    Packs/day: 1.00    Years: 20.00    Pack years: 20.00    Types: Cigarettes    Start date: 07/30/1992    Last attempt to quit: 12/16/2014    Years since quitting: 2.7  . Smokeless tobacco: Never Used  . Tobacco comment: on/off  Substance and Sexual Activity  . Alcohol use: Yes    Alcohol/week: 0.0 oz    Comment: 09/08/2015 "I'll have a few drinks/year"  . Drug use: No    Comment: Past  experimented some in college; not a regular user when I did use-none over 15 yrs  . Sexual activity: Not Currently  Other Topics Concern  . Not on file  Social History Narrative   Lives by himself       Allergies as of 08/29/2017   No Known Allergies     Medication List        Accurate as of 08/29/17  5:51 PM. Always use your most recent med list.  aspirin EC 81 MG tablet Take 81 mg by mouth daily.   canagliflozin 300 MG Tabs tablet Commonly known as:  INVOKANA Take 1 tablet (300 mg total) by mouth daily before breakfast.   fenofibrate 160 MG tablet Take 1 tablet (160 mg total) by mouth daily.   MELATONIN PO Take 1 tablet by mouth at bedtime as needed (sleep).   metFORMIN 850 MG tablet Commonly known as:  GLUCOPHAGE Take 1 tablet (850 mg total) by mouth 3 (three) times daily with meals.   multivitamin tablet Take 1 tablet by mouth daily. Vegan Multi-Pak   MYDAYIS 50 MG Cp24 Generic drug:  Amphet-Dextroamphet 3-Bead ER Take 50 mg by mouth daily.   PROBIOTIC PO Take 1 tablet by mouth daily. Taking [2] two OTC Probiotics   psyllium 58.6 % powder Commonly known as:  METAMUCIL Take 1 packet daily by mouth.   sitaGLIPtin 100 MG tablet Commonly  known as:  JANUVIA Take 1 tablet (100 mg total) by mouth daily.          Objective:   Physical Exam BP 138/84 (BP Location: Left Arm, Patient Position: Sitting, Cuff Size: Normal)   Pulse 94   Temp 98.2 F (36.8 C) (Oral)   Resp 14   Ht 6' (1.829 m)   Wt (!) 341 lb 2 oz (154.7 kg)   SpO2 96%   BMI 46.26 kg/m  General:   Well developed, morbidly obese appearing. NAD.  HEENT:  Normocephalic . Face symmetric, atraumatic Lungs:  CTA B Normal respiratory effort, no intercostal retractions, no accessory muscle use. Heart: RRR,  no murmur.  No pretibial edema bilaterally  Skin: Not pale. Not jaundice Neurologic:  alert & oriented X3.  Speech normal, gait appropriate for age and unassisted Psych--  Cognition and judgment appear intact.  Cooperative with normal attention span and concentration.  Behavior appropriate. No anxious or depressed appearing.      Assessment & Plan:    Assessment DM-- dx 2014, took invokana, metformin, dc ~06-2014 as diet improved, a1c 13 ( 08-2015), meds restarted Gout Morbid obesity OSA- intolerant to cpap ADHD- per other provider  Insomnia H/o diverticulitis, sigmoid,w/ perf, conservative Rx 10-2014 , Admitted again 08/2015, abscess, s/p drainage. Tobacco- wellbutrin intolerant   PLAN: DM: Last A1c 8.1, we added Januvia,  Also takes Invokana and metformin.  Doing excellent with diet, has lost several pounds, praised!  Declined eye referral at this point.  Will check A1c.  Encourage increased physical activity High cholesterol: based on last FLP, added fenofibrate, checking labs Morbid obesity: Eating healthier, has lost about 13 pounds! Tobacco:  not to smoking, uses e- cigarette sporadically. Preventive care, check a HIV RTC 4 months F2F > 15

## 2017-08-30 LAB — HIV ANTIBODY (ROUTINE TESTING W REFLEX): HIV 1&2 Ab, 4th Generation: NONREACTIVE

## 2017-09-05 DIAGNOSIS — F419 Anxiety disorder, unspecified: Secondary | ICD-10-CM | POA: Diagnosis not present

## 2017-09-05 DIAGNOSIS — G4733 Obstructive sleep apnea (adult) (pediatric): Secondary | ICD-10-CM | POA: Diagnosis not present

## 2017-09-05 DIAGNOSIS — Z79899 Other long term (current) drug therapy: Secondary | ICD-10-CM | POA: Diagnosis not present

## 2017-09-05 DIAGNOSIS — F902 Attention-deficit hyperactivity disorder, combined type: Secondary | ICD-10-CM | POA: Diagnosis not present

## 2017-10-19 ENCOUNTER — Other Ambulatory Visit: Payer: Self-pay | Admitting: Internal Medicine

## 2017-12-24 DIAGNOSIS — F902 Attention-deficit hyperactivity disorder, combined type: Secondary | ICD-10-CM | POA: Diagnosis not present

## 2017-12-24 DIAGNOSIS — Z79899 Other long term (current) drug therapy: Secondary | ICD-10-CM | POA: Diagnosis not present

## 2017-12-31 ENCOUNTER — Ambulatory Visit (INDEPENDENT_AMBULATORY_CARE_PROVIDER_SITE_OTHER): Payer: BLUE CROSS/BLUE SHIELD | Admitting: Internal Medicine

## 2017-12-31 ENCOUNTER — Encounter: Payer: Self-pay | Admitting: Internal Medicine

## 2017-12-31 VITALS — BP 132/80 | HR 97 | Temp 98.3°F | Resp 16 | Ht 72.0 in | Wt 345.2 lb

## 2017-12-31 DIAGNOSIS — M109 Gout, unspecified: Secondary | ICD-10-CM | POA: Diagnosis not present

## 2017-12-31 DIAGNOSIS — Z0100 Encounter for examination of eyes and vision without abnormal findings: Secondary | ICD-10-CM

## 2017-12-31 DIAGNOSIS — E119 Type 2 diabetes mellitus without complications: Secondary | ICD-10-CM

## 2017-12-31 DIAGNOSIS — E1165 Type 2 diabetes mellitus with hyperglycemia: Secondary | ICD-10-CM | POA: Diagnosis not present

## 2017-12-31 LAB — URIC ACID: Uric Acid, Serum: 6.8 mg/dL (ref 4.0–7.8)

## 2017-12-31 LAB — BASIC METABOLIC PANEL
BUN: 11 mg/dL (ref 6–23)
CO2: 27 mEq/L (ref 19–32)
Calcium: 9.4 mg/dL (ref 8.4–10.5)
Chloride: 101 mEq/L (ref 96–112)
Creatinine, Ser: 1.15 mg/dL (ref 0.40–1.50)
GFR: 74.9 mL/min (ref >60.00)
Glucose, Bld: 141 mg/dL — ABNORMAL HIGH (ref 70–99)
Potassium: 4.6 mEq/L (ref 3.5–5.1)
Sodium: 137 mEq/L (ref 135–145)

## 2017-12-31 LAB — HEMOGLOBIN A1C: Hgb A1c MFr Bld: 7.5 % — ABNORMAL HIGH (ref 4.6–6.5)

## 2017-12-31 MED ORDER — ONETOUCH LANCETS MISC: 12 refills | Status: DC

## 2017-12-31 MED ORDER — ONETOUCH VERIO FLEX SYSTEM W/DEVICE KIT: PACK | 0 refills | Status: DC

## 2017-12-31 MED ORDER — GLUCOSE BLOOD VI STRP: ORAL_STRIP | 12 refills | Status: DC

## 2017-12-31 NOTE — Patient Instructions (Addendum)
GO TO THE LAB : Get the blood work     GO TO THE FRONT DESK Schedule your next appointment for a physical exam in 4 months  Diabetes: Check your blood sugar  once a day    at different times of the day GOALS: Fasting before a meal 70- 130 2 hours after a meal less than 180   DIABETES self learn tools: The American diabetes Association     diabetes.[facility] website it is a Microbiologist  [place].org  The [facility] web site has a diabetes section  [employer]

## 2017-12-31 NOTE — Assessment & Plan Note (Signed)
DM: Currently on Januvia, metformin, Invokana, last A1c 7.4, improved from previous A1C.  Will check a BMP, A1c.  Even if the A1c is elevated, he will like to continue the same medications and focus on diet and increase physical activity.  We had a long conversation about the issue. Gout: Asymptomatic, check a uric acid per patient request. Tobacco abuse: Not smoking, praised! he continue vaping for now but is looking into also quit vaping at some point. RTC, CPX, 4 months

## 2017-12-31 NOTE — Progress Notes (Signed)
Pre visit review using our clinic review tool, if applicable. No additional management support is needed unless otherwise documented below in the visit note. 

## 2017-12-31 NOTE — Progress Notes (Signed)
Subjective:    Patient ID: Spencer Good, male    DOB: 01-26-1976, 42 y.o.   MRN: [id number]  DOS:  12/31/2017 Type of visit - description : rov Interval history: DM: Good compliance of medication, no ambulatory CBGs Gout: No recent events, would like to check his uric acid High cholesterol: Good compliance with medications  Wt Readings from Last 3 Encounters:  12/31/17 (!) 345 lb 4 oz (156.6 kg)  08/29/17 (!) 341 lb 2 oz (154.7 kg)  04/23/17 (!) 354 lb 2 oz (160.6 kg)     Review of Systems  New job, working from home, he enjoys the new job although is a little more stressful.   Also some stress due to his dog being diagnosed with dementia. Diet is healthy on and off, has not been exercising much lately.  Past Medical History:  Diagnosis Date  . ADHD (attention deficit hyperactivity disorder)   . Diabetes ([place])   . Diverticulitis of colon with perforation    s/p sigmoid colectomy  . Gout   . Hernia, umbilical 9381   Laparoscopic umbilical hernia repair with mesh- s/p repair-resolved.  Marland Kitchen History of colon polyps   . Insomnia   . Obesity   . OSA (obstructive sleep apnea) dx 05-2013   Rx a Cpap 3-15, can't use - no longer has machine.    Past Surgical History:  Procedure Laterality Date  . INSERTION OF MESH N/A 02/01/2015   Procedure: INSERTION OF MESH;  Surgeon: Rolm Bookbinder, MD;  Location: [place];  Service: General;  Laterality: N/A;  . LAPAROSCOPIC SIGMOID COLECTOMY N/A 05/24/2016   Procedure: LAPAROSCOPIC SIGMOID COLECTOMY;  Surgeon: Excell Seltzer, MD;  Location: [facility];  Service: General;  Laterality: N/A;  . LASIK Bilateral   . UMBILICAL HERNIA REPAIR N/A 02/01/2015   Procedure: LAPAROSCOPIC UMBILICAL HERNIA REPAIR WITH MESH;  Surgeon: Rolm Bookbinder, MD;  Location: [place];  Service: General;  Laterality: N/A;    Social History   Socioeconomic History  . Marital status: Single    Spouse name: Not on file  . Number of children: 0  . Years of  education: Not on file  . Highest education level: Not on file  Occupational History  . Occupation: auto finance,works from home  [employer]  Social Needs  . Financial resource strain: Not on file  . Food insecurity:    Worry: Not on file    Inability: Not on file  . Transportation needs:    Medical: Not on file    Non-medical: Not on file  Tobacco Use  . Smoking status: Former Smoker    Packs/day: 1.00    Years: 20.00    Pack years: 20.00    Types: Cigarettes    Start date: 07/30/1992    Last attempt to quit: 12/16/2014    Years since quitting: 3.0  . Smokeless tobacco: Never Used  . Tobacco comment: vaping , no cigarrets   Substance and Sexual Activity  . Alcohol use: Yes    Alcohol/week: 0.0 oz    Comment: 09/08/2015 "I'll have a few drinks/year"  . Drug use: No    Comment: Past  experimented some in college; not a regular user when I did use-none over 15 yrs  . Sexual activity: Not Currently  Lifestyle  . Physical activity:    Days per week: Not on file    Minutes per session: Not on file  . Stress: Not on file  Relationships  . Social connections:  Talks on phone: Not on file    Gets together: Not on file    Attends religious service: Not on file    Active member of club or organization: Not on file    Attends meetings of clubs or organizations: Not on file    Relationship status: Not on file  . Intimate partner violence:    Fear of current or ex partner: Not on file    Emotionally abused: Not on file    Physically abused: Not on file    Forced sexual activity: Not on file  Other Topics Concern  . Not on file  Social History Narrative   Lives by himself       Allergies as of 12/31/2017   No Known Allergies     Medication List        Accurate as of 12/31/17  5:23 PM. Always use your most recent med list.          aspirin EC 81 MG tablet Take 81 mg by mouth daily.   canagliflozin 300 MG Tabs tablet Commonly known as:  INVOKANA Take 1 tablet (300 mg  total) by mouth daily before breakfast.   fenofibrate 160 MG tablet Take 1 tablet (160 mg total) by mouth daily.   glucose blood test strip Commonly known as:  ONETOUCH VERIO Check blood sugar once daily   MELATONIN PO Take 1 tablet by mouth at bedtime as needed (sleep).   metFORMIN 850 MG tablet Commonly known as:  GLUCOPHAGE Take 1 tablet (850 mg total) by mouth 3 (three) times daily with meals.   multivitamin tablet Take 1 tablet by mouth daily. Vegan Multi-Pak   MYDAYIS 50 MG Cp24 Generic drug:  Amphet-Dextroamphet 3-Bead ER Take 50 mg by mouth daily.   ONE TOUCH LANCETS Misc Check blood sugar once daily   ONETOUCH VERIO FLEX SYSTEM w/Device Kit Check blood sugar once daily   PROBIOTIC PO Take 1 tablet by mouth daily. Taking [2] two OTC Probiotics   psyllium 58.6 % powder Commonly known as:  METAMUCIL Take 1 packet daily by mouth.   sitaGLIPtin 100 MG tablet Commonly known as:  JANUVIA Take 1 tablet (100 mg total) by mouth daily.          Objective:   Physical Exam BP 132/80 (BP Location: Left Arm, Patient Position: Sitting, Cuff Size: Normal)   Pulse 97   Temp 98.3 F (36.8 C) (Oral)   Resp 16   Ht 6' (1.829 m)   Wt (!) 345 lb 4 oz (156.6 kg)   SpO2 98%   BMI 46.82 kg/m  General:   Well developed, NAD, see BMI.  HEENT:  Normocephalic . Face symmetric, atraumatic Lungs:  CTA B Normal respiratory effort, no intercostal retractions, no accessory muscle use. Heart: RRR,  no murmur.  No pretibial edema bilaterally  Skin: Not pale. Not jaundice Neurologic:  alert & oriented X3.  Speech normal, gait appropriate for age and unassisted Psych--  Cognition and judgment appear intact.  Cooperative with normal attention span and concentration.  Behavior appropriate. No anxious or depressed appearing.      Assessment & Plan:    Assessment DM-- dx 2014, took invokana, metformin, dc ~06-2014 as diet improved, a1c 13 ( 08-2015), meds  restarted Gout Morbid obesity OSA- intolerant to cpap ADHD- per other provider  Insomnia H/o diverticulitis, sigmoid,w/ perf, conservative Rx 10-2014 , Admitted again 08/2015, abscess, s/p drainage. Tobacco- wellbutrin intolerant   PLAN: DM: Currently on Januvia, metformin, Invokana, last  A1c 7.4, improved from previous A1C.  Will check a BMP, A1c.  Even if the A1c is elevated, he will like to continue the same medications and focus on diet and increase physical activity.  We had a long conversation about the issue. Gout: Asymptomatic, check a uric acid per patient request. Tobacco abuse: Not smoking, praised! he continue vaping for now but is looking into also quit vaping at some point. RTC, CPX, 4 months

## 2018-02-06 DIAGNOSIS — H524 Presbyopia: Secondary | ICD-10-CM | POA: Diagnosis not present

## 2018-02-06 DIAGNOSIS — E119 Type 2 diabetes mellitus without complications: Secondary | ICD-10-CM | POA: Diagnosis not present

## 2018-02-06 DIAGNOSIS — D2310 Other benign neoplasm of skin of unspecified eyelid, including canthus: Secondary | ICD-10-CM | POA: Diagnosis not present

## 2018-02-21 ENCOUNTER — Other Ambulatory Visit: Payer: Self-pay | Admitting: Internal Medicine

## 2018-03-25 DIAGNOSIS — G4733 Obstructive sleep apnea (adult) (pediatric): Secondary | ICD-10-CM | POA: Diagnosis not present

## 2018-03-25 DIAGNOSIS — F902 Attention-deficit hyperactivity disorder, combined type: Secondary | ICD-10-CM | POA: Diagnosis not present

## 2018-03-25 DIAGNOSIS — F419 Anxiety disorder, unspecified: Secondary | ICD-10-CM | POA: Diagnosis not present

## 2018-03-25 DIAGNOSIS — Z79899 Other long term (current) drug therapy: Secondary | ICD-10-CM | POA: Diagnosis not present

## 2018-04-26 ENCOUNTER — Other Ambulatory Visit: Payer: Self-pay | Admitting: Internal Medicine

## 2018-06-24 DIAGNOSIS — G4733 Obstructive sleep apnea (adult) (pediatric): Secondary | ICD-10-CM | POA: Diagnosis not present

## 2018-06-24 DIAGNOSIS — Z79899 Other long term (current) drug therapy: Secondary | ICD-10-CM | POA: Diagnosis not present

## 2018-06-24 DIAGNOSIS — F902 Attention-deficit hyperactivity disorder, combined type: Secondary | ICD-10-CM | POA: Diagnosis not present

## 2018-06-26 ENCOUNTER — Other Ambulatory Visit: Payer: Self-pay | Admitting: Internal Medicine

## 2018-07-27 ENCOUNTER — Other Ambulatory Visit: Payer: Self-pay | Admitting: Internal Medicine

## 2018-07-31 MED ORDER — SITAGLIPTIN PHOSPHATE 100 MG PO TABS: 100.0000 mg | ORAL_TABLET | Freq: Every day | ORAL | 0 refills | Status: DC

## 2018-07-31 MED ORDER — CANAGLIFLOZIN 300 MG PO TABS: 300.0000 mg | ORAL_TABLET | Freq: Every day | ORAL | 0 refills | Status: DC

## 2018-07-31 MED ORDER — METFORMIN HCL 850 MG PO TABS: 850.0000 mg | ORAL_TABLET | Freq: Three times a day (TID) | ORAL | 0 refills | Status: DC

## 2018-07-31 MED ORDER — FENOFIBRATE 160 MG PO TABS: 160.0000 mg | ORAL_TABLET | Freq: Every day | ORAL | 0 refills | Status: DC

## 2018-07-31 NOTE — Telephone Encounter (Signed)
7 day supply sent. Must keep appt for further refills.

## 2018-07-31 NOTE — Telephone Encounter (Signed)
Patient is scheduled for Tuesday at 8:40 for refills. He said he would like to know if he could have refills until the appt or does he need to wait until after because he is out [phone]

## 2018-07-31 NOTE — Addendum Note (Signed)
Addended byDamita Dunnings D on: 07/31/2018 02:22 PM   Modules accepted: Orders

## 2018-08-04 ENCOUNTER — Ambulatory Visit: Payer: BLUE CROSS/BLUE SHIELD | Admitting: Internal Medicine

## 2018-08-05 ENCOUNTER — Encounter: Payer: Self-pay | Admitting: Internal Medicine

## 2018-08-05 ENCOUNTER — Ambulatory Visit: Payer: BLUE CROSS/BLUE SHIELD | Admitting: Internal Medicine

## 2018-08-05 VITALS — BP 132/80 | HR 65 | Temp 97.5°F | Resp 16 | Ht 72.0 in | Wt 348.2 lb

## 2018-08-05 DIAGNOSIS — E1165 Type 2 diabetes mellitus with hyperglycemia: Secondary | ICD-10-CM | POA: Diagnosis not present

## 2018-08-05 DIAGNOSIS — E785 Hyperlipidemia, unspecified: Secondary | ICD-10-CM

## 2018-08-05 DIAGNOSIS — D72829 Elevated white blood cell count, unspecified: Secondary | ICD-10-CM

## 2018-08-05 DIAGNOSIS — Z23 Encounter for immunization: Secondary | ICD-10-CM | POA: Diagnosis not present

## 2018-08-05 LAB — CBC WITH DIFFERENTIAL/PLATELET
Basophils Absolute: 0.1 10*3/uL (ref 0.0–0.1)
Basophils Relative: 0.7 % (ref 0.0–3.0)
Eosinophils Absolute: 0.2 10*3/uL (ref 0.0–0.7)
Eosinophils Relative: 2.5 % (ref 0.0–5.0)
HCT: 45.7 % (ref 39.0–52.0)
Hemoglobin: 15.4 g/dL (ref 13.0–17.0)
Lymphocytes Relative: 41 % (ref 12.0–46.0)
Lymphs Abs: 3.4 10*3/uL (ref 0.7–4.0)
MCHC: 33.8 g/dL (ref 30.0–36.0)
MCV: 80.7 fl (ref 78.0–100.0)
Monocytes Absolute: 0.5 10*3/uL (ref 0.1–1.0)
Monocytes Relative: 6.4 % (ref 3.0–12.0)
Neutro Abs: 4.1 10*3/uL (ref 1.4–7.7)
Neutrophils Relative %: 49.4 % (ref 43.0–77.0)
Platelets: 264 10*3/uL (ref 150.0–400.0)
RBC: 5.66 Mil/uL (ref 4.22–5.81)
RDW: 14.8 % (ref 11.5–15.5)
WBC: 8.3 10*3/uL (ref 4.0–10.5)

## 2018-08-05 LAB — AST: AST: 18 U/L (ref 0–37)

## 2018-08-05 LAB — BASIC METABOLIC PANEL
BUN: 18 mg/dL (ref 6–23)
CO2: 26 mEq/L (ref 19–32)
Calcium: 9.4 mg/dL (ref 8.4–10.5)
Chloride: 103 mEq/L (ref 96–112)
Creatinine, Ser: 1.08 mg/dL (ref 0.40–1.50)
GFR: 75.54 mL/min (ref >60.00)
Glucose, Bld: 140 mg/dL — ABNORMAL HIGH (ref 70–99)
Potassium: 4.5 mEq/L (ref 3.5–5.1)
Sodium: 138 mEq/L (ref 135–145)

## 2018-08-05 LAB — LDL CHOLESTEROL, DIRECT: Direct LDL: 145 mg/dL

## 2018-08-05 LAB — LIPID PANEL
Cholesterol: 214 mg/dL — ABNORMAL HIGH (ref 0–200)
HDL: 29.1 mg/dL — ABNORMAL LOW (ref >39.00)
NonHDL: 184.86
Total CHOL/HDL Ratio: 7
Triglycerides: 307 mg/dL — ABNORMAL HIGH (ref 0.0–149.0)
VLDL: 61.4 mg/dL — ABNORMAL HIGH (ref 0.0–40.0)

## 2018-08-05 LAB — MICROALBUMIN / CREATININE URINE RATIO
Creatinine,U: 88.4 mg/dL
Microalb Creat Ratio: 0.8 mg/g (ref 0.0–30.0)
Microalb, Ur: <0.7 mg/dL (ref 0.0–1.9)

## 2018-08-05 LAB — ALT: ALT: 33 U/L (ref 0–53)

## 2018-08-05 LAB — HEMOGLOBIN A1C: Hgb A1c MFr Bld: 8.1 % — ABNORMAL HIGH (ref 4.6–6.5)

## 2018-08-05 NOTE — Patient Instructions (Addendum)
Per our records you are due for an eye exam. Please contact your eye doctor to schedule an appointment. Please have them send copies of your office visit notes to [place]. Our fax number is [phone].   GO TO THE LAB : Get the blood work     GO TO THE FRONT DESK Schedule your next appointment  For a physical exam in 4 months

## 2018-08-05 NOTE — Progress Notes (Signed)
Subjective:    Patient ID: Spencer Good, male    DOB: 1976-02-18, 43 y.o.   MRN: [id number]  DOS:  08/05/2018 Type of visit - description: Routine checkup DM: Good med compliance, not doing well with diet, is a slightly more active lately, has 2 new puppies. Dyslipidemia: Good compliance with medications. Tobacco: Still vaping, not using cigarettes. History of sleep apnea, untreated due to poor tolerance, energy is okay most days, occasionally feels a sleepy but does not interfere with work or driving.  Review of Systems Denies any GU rash No lower extremity paresthesias  Past Medical History:  Diagnosis Date  . ADHD (attention deficit hyperactivity disorder)   . Diabetes ([place])   . Diverticulitis of colon with perforation    s/p sigmoid colectomy  . Gout   . Hernia, umbilical 7078   Laparoscopic umbilical hernia repair with mesh- s/p repair-resolved.  Marland Kitchen History of colon polyps   . Insomnia   . Obesity   . OSA (obstructive sleep apnea) dx 05-2013   Rx a Cpap 3-15, can't use - no longer has machine.    Past Surgical History:  Procedure Laterality Date  . INSERTION OF MESH N/A 02/01/2015   Procedure: INSERTION OF MESH;  Surgeon: Rolm Bookbinder, MD;  Location: [place];  Service: General;  Laterality: N/A;  . LAPAROSCOPIC SIGMOID COLECTOMY N/A 05/24/2016   Procedure: LAPAROSCOPIC SIGMOID COLECTOMY;  Surgeon: Excell Seltzer, MD;  [facility];  Service: General;  Laterality: N/A;  . LASIK Bilateral   . UMBILICAL HERNIA REPAIR N/A 02/01/2015   Procedure: LAPAROSCOPIC UMBILICAL HERNIA REPAIR WITH MESH;  Surgeon: Rolm Bookbinder, MD;  Location: [place];  Service: General;  Laterality: N/A;    Social History   Socioeconomic History  . Marital status: Single    Spouse name: Not on file  . Number of children: 0  . Years of education: Not on file  . Highest education level: Not on file  Occupational History  . Occupation: auto finance,works from home  [employer]  Social  Needs  . Financial resource strain: Not on file  . Food insecurity:    Worry: Not on file    Inability: Not on file  . Transportation needs:    Medical: Not on file    Non-medical: Not on file  Tobacco Use  . Smoking status: Former Smoker    Packs/day: 1.00    Years: 20.00    Pack years: 20.00    Types: Cigarettes    Start date: 07/30/1992    Last attempt to quit: 12/16/2014    Years since quitting: 3.6  . Smokeless tobacco: Never Used  . Tobacco comment: vaping , no cigarrets   Substance and Sexual Activity  . Alcohol use: Yes    Alcohol/week: 0.0 standard drinks    Comment: 09/08/2015 "I'll have a few drinks/year"  . Drug use: No    Comment: Past  experimented some in college; not a regular user when I did use-none over 15 yrs  . Sexual activity: Not Currently  Lifestyle  . Physical activity:    Days per week: Not on file    Minutes per session: Not on file  . Stress: Not on file  Relationships  . Social connections:    Talks on phone: Not on file    Gets together: Not on file    Attends religious service: Not on file    Active member of club or organization: Not on file    Attends meetings of  clubs or organizations: Not on file    Relationship status: Not on file  . Intimate partner violence:    Fear of current or ex partner: Not on file    Emotionally abused: Not on file    Physically abused: Not on file    Forced sexual activity: Not on file  Other Topics Concern  . Not on file  Social History Narrative   Lives by himself       Allergies as of 08/05/2018   No Known Allergies     Medication List       Accurate as of August 05, 2018  5:20 PM. Always use your most recent med list.        aspirin EC 81 MG tablet Take 81 mg by mouth daily.   canagliflozin 300 MG Tabs tablet Commonly known as:  INVOKANA Take 1 tablet (300 mg total) by mouth daily before breakfast.   fenofibrate 160 MG tablet Take 1 tablet (160 mg total) by mouth daily.   glucose  blood test strip Commonly known as:  ONETOUCH VERIO Check blood sugar once daily   MELATONIN PO Take 1 tablet by mouth at bedtime as needed (sleep).   metFORMIN 850 MG tablet Commonly known as:  GLUCOPHAGE Take 1 tablet (850 mg total) by mouth 3 (three) times daily with meals.   multivitamin tablet Take 1 tablet by mouth daily. Vegan Multi-Pak   MYDAYIS 50 MG Cp24 Generic drug:  Amphet-Dextroamphet 3-Bead ER Take 50 mg by mouth daily.   ONE TOUCH LANCETS Misc Check blood sugar once daily   ONETOUCH VERIO FLEX SYSTEM w/Device Kit Check blood sugar once daily   PROBIOTIC PO Take 1 tablet by mouth daily. Taking [2] two OTC Probiotics   psyllium 58.6 % powder Commonly known as:  METAMUCIL Take 1 packet daily by mouth.   sitaGLIPtin 100 MG tablet Commonly known as:  JANUVIA Take 1 tablet (100 mg total) by mouth daily.           Objective:   Physical Exam BP 132/80 (BP Location: Left Arm, Patient Position: Sitting, Cuff Size: Normal)   Pulse 65   Temp (!) 97.5 F (36.4 C) (Oral)   Resp 16   Ht 6' (1.829 m)   Wt (!) 348 lb 4 oz (158 kg)   SpO2 98%   BMI 47.23 kg/m  General:   Well developed, NAD, BMI noted. HEENT:  Normocephalic . Face symmetric, atraumatic Lungs:  CTA B Normal respiratory effort, no intercostal retractions, no accessory muscle use. Heart: RRR,  no murmur.  No pretibial edema bilaterally  Skin: Not pale. Not jaundice DIABETIC FEET EXAM: No lower extremity edema Normal pedal pulses bilaterally Skin is very dry, nails normal, no calluses Pinprick examination of the feet normal. Neurologic:  alert & oriented X3.  Speech normal, gait appropriate for age and unassisted Psych--  Cognition and judgment appear intact.  Cooperative with normal attention span and concentration.  Behavior appropriate. No anxious or depressed appearing.      Assessment      Assessment DM-- dx 2014, took invokana, metformin, dc ~06-2014 as diet improved,  a1c 13 ( 08-2015), meds restarted Gout Morbid obesity OSA- intolerant to cpap ADHD- per other provider  Insomnia H/o diverticulitis, sigmoid,w/ perf, conservative Rx 10-2014 , Admitted again 08/2015, abscess, s/p drainage. Tobacco- wellbutrin intolerant   PLAN: DM: Currently on metformin, Januvia and Invokana.  Not doing well with diet, we had extensive conversation about it, including weight watchers, calorie  counting etc. Feet exam negative. We will check a BMP, A1c, micro. Dyslipidemia, on fenofibrate, check FLP, AST, ALT OSA: CPAP intolerant, no major symptoms Morbid obesity: Extensively counseled at regards diet and exercise Tobacco abuse: Not using cigarettes, vaping.   Not ready to quit RTC 4 months CPX

## 2018-08-05 NOTE — Assessment & Plan Note (Signed)
DM: Currently on metformin, Januvia and Invokana.  Not doing well with diet, we had extensive conversation about it, including weight watchers, calorie counting etc. Feet exam negative. We will check a BMP, A1c, micro. Dyslipidemia, on fenofibrate, check FLP, AST, ALT OSA: CPAP intolerant, no major symptoms Morbid obesity: Extensively counseled at regards diet and exercise Tobacco abuse: Not using cigarettes, vaping.   Not ready to quit RTC 4 months CPX

## 2018-08-05 NOTE — Progress Notes (Signed)
Pre visit review using our clinic review tool, if applicable. No additional management support is needed unless otherwise documented below in the visit note. 

## 2018-08-06 MED ORDER — METFORMIN HCL 850 MG PO TABS: 850.0000 mg | ORAL_TABLET | Freq: Three times a day (TID) | ORAL | 4 refills | Status: DC

## 2018-08-06 MED ORDER — CANAGLIFLOZIN 300 MG PO TABS: 300.0000 mg | ORAL_TABLET | Freq: Every day | ORAL | 4 refills | Status: DC

## 2018-08-06 MED ORDER — FENOFIBRATE 160 MG PO TABS: 160.0000 mg | ORAL_TABLET | Freq: Every day | ORAL | 4 refills | Status: DC

## 2018-08-06 MED ORDER — SEMAGLUTIDE(0.25 OR 0.5MG/DOS) 2 MG/1.5ML ~~LOC~~ SOPN: 0.2500 mg | PEN_INJECTOR | SUBCUTANEOUS | 0 refills | Status: DC

## 2018-08-06 NOTE — Addendum Note (Signed)
Addended byDamita Dunnings D on: 08/06/2018 02:58 PM   Modules accepted: Orders

## 2018-08-18 ENCOUNTER — Ambulatory Visit (INDEPENDENT_AMBULATORY_CARE_PROVIDER_SITE_OTHER): Payer: BLUE CROSS/BLUE SHIELD | Admitting: *Deleted

## 2018-08-18 DIAGNOSIS — E1165 Type 2 diabetes mellitus with hyperglycemia: Secondary | ICD-10-CM | POA: Diagnosis not present

## 2018-08-18 NOTE — Progress Notes (Signed)
Patient in today for starting ozempic pen per Dr. Larose Kells.  Patient will start 0.25mg  weekly.  Patient gave first injection on his own and tolerated well.    Advised to make sure he check his sugars will on this medication and watch for low sugars.

## 2018-10-14 DIAGNOSIS — G4733 Obstructive sleep apnea (adult) (pediatric): Secondary | ICD-10-CM | POA: Diagnosis not present

## 2018-10-14 DIAGNOSIS — F902 Attention-deficit hyperactivity disorder, combined type: Secondary | ICD-10-CM | POA: Diagnosis not present

## 2018-12-10 ENCOUNTER — Ambulatory Visit: Payer: BLUE CROSS/BLUE SHIELD | Admitting: Internal Medicine

## 2018-12-21 ENCOUNTER — Encounter: Payer: Self-pay | Admitting: Internal Medicine

## 2019-01-07 ENCOUNTER — Other Ambulatory Visit: Payer: Self-pay | Admitting: Internal Medicine

## 2019-01-08 ENCOUNTER — Encounter: Payer: Self-pay | Admitting: Internal Medicine

## 2019-01-08 MED ORDER — METFORMIN HCL 850 MG PO TABS: 850.0000 mg | ORAL_TABLET | Freq: Three times a day (TID) | ORAL | 0 refills | Status: DC

## 2019-01-08 MED ORDER — FENOFIBRATE 160 MG PO TABS: 160.0000 mg | ORAL_TABLET | Freq: Every day | ORAL | 0 refills | Status: DC

## 2019-01-13 DIAGNOSIS — G4733 Obstructive sleep apnea (adult) (pediatric): Secondary | ICD-10-CM | POA: Diagnosis not present

## 2019-01-13 DIAGNOSIS — F902 Attention-deficit hyperactivity disorder, combined type: Secondary | ICD-10-CM | POA: Diagnosis not present

## 2019-02-02 ENCOUNTER — Ambulatory Visit (INDEPENDENT_AMBULATORY_CARE_PROVIDER_SITE_OTHER): Payer: BC Managed Care – PPO | Admitting: Internal Medicine

## 2019-02-02 ENCOUNTER — Other Ambulatory Visit: Payer: Self-pay | Admitting: Internal Medicine

## 2019-02-02 ENCOUNTER — Other Ambulatory Visit: Payer: Self-pay

## 2019-02-02 ENCOUNTER — Encounter: Payer: Self-pay | Admitting: Internal Medicine

## 2019-02-02 VITALS — BP 135/90 | HR 115 | Temp 97.1°F | Resp 16 | Ht 72.0 in | Wt 343.0 lb

## 2019-02-02 DIAGNOSIS — Z0000 Encounter for general adult medical examination without abnormal findings: Secondary | ICD-10-CM

## 2019-02-02 DIAGNOSIS — E1165 Type 2 diabetes mellitus with hyperglycemia: Secondary | ICD-10-CM

## 2019-02-02 DIAGNOSIS — F439 Reaction to severe stress, unspecified: Secondary | ICD-10-CM

## 2019-02-02 LAB — COMPREHENSIVE METABOLIC PANEL
ALT: 38 U/L (ref 0–53)
AST: 19 U/L (ref 0–37)
Albumin: 4.8 g/dL (ref 3.5–5.2)
Alkaline Phosphatase: 76 U/L (ref 39–117)
BUN: 19 mg/dL (ref 6–23)
CO2: 24 mEq/L (ref 19–32)
Calcium: 9.6 mg/dL (ref 8.4–10.5)
Chloride: 100 mEq/L (ref 96–112)
Creatinine, Ser: 1 mg/dL (ref 0.40–1.50)
GFR: 82.35 mL/min (ref >60.00)
Glucose, Bld: 131 mg/dL — ABNORMAL HIGH (ref 70–99)
Potassium: 4.3 mEq/L (ref 3.5–5.1)
Sodium: 136 mEq/L (ref 135–145)
Total Bilirubin: 0.4 mg/dL (ref 0.2–1.2)
Total Protein: 7.5 g/dL (ref 6.0–8.3)

## 2019-02-02 LAB — LIPID PANEL
Cholesterol: 216 mg/dL — ABNORMAL HIGH (ref 0–200)
HDL: 27.8 mg/dL — ABNORMAL LOW (ref >39.00)
Total CHOL/HDL Ratio: 8
Triglycerides: 668 mg/dL — ABNORMAL HIGH (ref 0.0–149.0)

## 2019-02-02 LAB — LDL CHOLESTEROL, DIRECT: Direct LDL: 98 mg/dL

## 2019-02-02 LAB — HEMOGLOBIN A1C: Hgb A1c MFr Bld: 7.7 % — ABNORMAL HIGH (ref 4.6–6.5)

## 2019-02-02 LAB — TSH: TSH: 2.39 u[IU]/mL (ref 0.35–4.50)

## 2019-02-02 LAB — VITAMIN B12: Vitamin B-12: 421 pg/mL (ref 211–911)

## 2019-02-02 NOTE — Progress Notes (Signed)
Subjective:    Patient ID: Spencer Good, male    DOB: [birth year], 43 y.o.   MRN: [id number]  DOS:  02/02/2019 Type of visit - description: cpx Here for CPX His main concern today is that he is losing his job at the end of the month. He is also very upset about the general economic and political situation in the country. BP is elevated today, typically much better at home.  Wt Readings from Last 3 Encounters:  02/02/19 (!) 343 lb (155.6 kg)  08/05/18 (!) 348 lb 4 oz (158 kg)  12/31/17 (!) 345 lb 4 oz (156.6 kg)    BP Readings from Last 3 Encounters:  02/02/19 135/90  08/05/18 132/80  12/31/17 132/80      Review of Systems  Other than above, a 14 point review of systems is negative      Past Medical History:  Diagnosis Date  . ADHD (attention deficit hyperactivity disorder)   . Diabetes ([place])   . Diverticulitis of colon with perforation    s/p sigmoid colectomy  . Gout   . Hernia, umbilical 4656   Laparoscopic umbilical hernia repair with mesh- s/p repair-resolved.  Marland Kitchen History of colon polyps   . Insomnia   . Obesity   . OSA (obstructive sleep apnea) dx 05-2013   Rx a Cpap 3-15, can't use - no longer has machine.    Past Surgical History:  Procedure Laterality Date  . INSERTION OF MESH N/A 02/01/2015   Procedure: INSERTION OF MESH;  Surgeon: Rolm Bookbinder, MD;  Location: [place];  Service: General;  Laterality: N/A;  . LAPAROSCOPIC SIGMOID COLECTOMY N/A 05/24/2016   Procedure: LAPAROSCOPIC SIGMOID COLECTOMY;  Surgeon: Excell Seltzer, MD;  [facility];  Service: General;  Laterality: N/A;  . LASIK Bilateral   . UMBILICAL HERNIA REPAIR N/A 02/01/2015   Procedure: LAPAROSCOPIC UMBILICAL HERNIA REPAIR WITH MESH;  Surgeon: Rolm Bookbinder, MD;  Location: [place];  Service: General;  Laterality: N/A;    Social History   Socioeconomic History  . Marital status: Single    Spouse name: Not on file  . Number of children: 0  . Years of education: Not on  file  . Highest education level: Not on file  Occupational History  . Occupation: lost job d/t Covid 01/2019  Social Needs  . Financial resource strain: Not on file  . Food insecurity    Worry: Not on file    Inability: Not on file  . Transportation needs    Medical: Not on file    Non-medical: Not on file  Tobacco Use  . Smoking status: Former Smoker    Packs/day: 1.00    Years: 20.00    Pack years: 20.00    Types: Cigarettes    Start date: 07/30/1992    Quit date: 12/16/2014    Years since quitting: 4.1  . Smokeless tobacco: Never Used  . Tobacco comment: + vaping , no cigarrets   Substance and Sexual Activity  . Alcohol use: Yes    Alcohol/week: 0.0 standard drinks    Comment: 09/08/2015 "I'll have a few drinks/year"  . Drug use: No    Comment: Past  experimented some in college; not a regular user when I did use-none over 15 yrs  . Sexual activity: Not Currently  Lifestyle  . Physical activity    Days per week: Not on file    Minutes per session: Not on file  . Stress: Not on file  Relationships  .  Social Herbalist on phone: Not on file    Gets together: Not on file    Attends religious service: Not on file    Active member of club or organization: Not on file    Attends meetings of clubs or organizations: Not on file    Relationship status: Not on file  . Intimate partner violence    Fear of current or ex partner: Not on file    Emotionally abused: Not on file    Physically abused: Not on file    Forced sexual activity: Not on file  Other Topics Concern  . Not on file  Social History Narrative   Lives by himself      Family History  Problem Relation Age of Onset  . Stroke Father        F, PGF  . CAD Other        MGF  . Heart disease Maternal Grandfather   . Colon cancer Neg Hx   . Prostate cancer Neg Hx   . Diabetes Neg Hx        distant fam members  . Colon polyps Neg Hx   . Esophageal cancer Neg Hx   . Gallbladder disease Neg Hx   .  Kidney disease Neg Hx      Allergies as of 02/02/2019   No Known Allergies     Medication List       Accurate as of February 02, 2019 11:59 PM. If you have any questions, ask your nurse or doctor.        STOP taking these medications   MELATONIN PO Stopped by: Kathlene November, MD     TAKE these medications   aspirin EC 81 MG tablet Take 81 mg by mouth daily.   canagliflozin 300 MG Tabs tablet Commonly known as: Invokana Take 1 tablet (300 mg total) by mouth daily before breakfast.   fenofibrate 160 MG tablet Take 1 tablet (160 mg total) by mouth daily.   glucose blood test strip Commonly known as: OneTouch Verio Check blood sugar once daily   metFORMIN 850 MG tablet Commonly known as: GLUCOPHAGE Take 1 tablet (850 mg total) by mouth 3 (three) times daily with meals.   multivitamin tablet Take 1 tablet by mouth daily. Vegan Multi-Pak   Mydayis 50 MG Cp24 Generic drug: Amphet-Dextroamphet 3-Bead ER Take 50 mg by mouth daily.   ONE TOUCH LANCETS Misc Check blood sugar once daily   OneTouch Verio Flex System w/Device Kit Check blood sugar once daily   PROBIOTIC PO Take 1 tablet by mouth daily. Taking [2] two OTC Probiotics   psyllium 58.6 % powder Commonly known as: METAMUCIL Take 1 packet daily by mouth.   Semaglutide(0.25 or 0.5MG/DOS) 2 MG/1.5ML Sopn Commonly known as: Ozempic (0.25 or 0.5 MG/DOSE) Inject 0.25 mg into the skin once a week.           Objective:   Physical Exam BP 135/90 (BP Location: Right Arm)   Pulse (!) 115   Temp (!) 97.1 F (36.2 C) (Temporal)   Resp 16   Ht 6' (1.829 m)   Wt (!) 343 lb (155.6 kg)   SpO2 97%   BMI 46.52 kg/m  General: Well developed, NAD, BMI noted Neck: No  thyromegaly  HEENT:  Normocephalic . Face symmetric, atraumatic Lungs:  CTA B Normal respiratory effort, no intercostal retractions, no accessory muscle use. Heart: RRR,  no murmur.  No pretibial edema bilaterally  Abdomen:  Not distended, soft,  non-tender. No rebound or rigidity.  Central obesity Skin: Exposed areas without rash. Not pale. Not jaundice Neurologic:  alert & oriented X3.  Speech normal, gait appropriate for age and unassisted Strength symmetric and appropriate for age.  Psych: Cognition and judgment appear intact.  Cooperative with normal attention span and concentration.  Behavior appropriate. Seems moderately anxious but no depressed appearing.     Assessment     Assessment DM-- dx 2014, took invokana, metformin, dc ~06-2014 as diet improved, a1c 13 ( 08-2015), meds restarted Gout Morbid obesity OSA- intolerant to cpap ADHD- per other provider  Insomnia H/o diverticulitis, sigmoid,w/ perf, conservative Rx 10-2014 , Admitted again 08/2015, abscess, s/p drainage. Tobacco- wellbutrin intolerant   PLAN: DM: Currently on metformin, Invokana.  A1c on February 2020 was 8.1: Januvia stopped, started Ozempic.  CBGs varies and apparently independent of what he eats. Has lost 5 pounds.  No meaningful change on his diet or exercise. Plan: Labs, again long conversation about the need to change diet and exercise. For now will continue same medications, due to patient possibly losing his insurance, may have to modify his regimen soon. Morbid obesity: Extensive discussion about diet and exercise Stress: Losing his job at the end of the month, counseled.  He is also very stressed about the news, he stopped watching news and is doing other activities which I agree and recommend. RTC 4 months

## 2019-02-02 NOTE — Assessment & Plan Note (Signed)
-  Td 2012 -PNM 23: 2016 - PNM 13 2017 - rec flu shot  - cscope 12/2014, polyp, 5 years, Dr Henrene Pastor -labs CMP, FLP, A1c, TSH, B12 (taking metformin) -Diet and exercise: Extensively discussed

## 2019-02-02 NOTE — Progress Notes (Signed)
Pre visit review using our clinic review tool, if applicable. No additional management support is needed unless otherwise documented below in the visit note. 

## 2019-02-02 NOTE — Patient Instructions (Addendum)
Per our records you are due for an eye exam. Please contact your eye doctor to schedule an appointment. Please have them send copies of your office visit notes to [place]. Our fax number is [phone].   GO TO THE LAB : Get the blood work     GO TO THE FRONT DESK Schedule your next appointment  For a check up in 4 months     Flu shot this fall!  Check the  blood pressure   weekly   BP GOAL is between 110/65 and  135/85. If it is consistently higher or lower, let me know Call if your heart rate is > 100 Januvia, Invokana.  Started Ozempic

## 2019-02-03 NOTE — Assessment & Plan Note (Signed)
DM: Currently on metformin, Invokana.  A1c on February 2020 was 8.1: Januvia stopped, started Ozempic.  CBGs varies and apparently independent of what he eats. Has lost 5 pounds.  No meaningful change on his diet or exercise. Plan: Labs, again long conversation about the need to change diet and exercise. For now will continue same medications, due to patient possibly losing his insurance, may have to modify his regimen soon. Morbid obesity: Extensive discussion about diet and exercise Stress: Losing his job at the end of the month, counseled.  He is also very stressed about the news, he stopped watching news and is doing other activities which I agree and recommend. RTC 4 months

## 2019-02-05 MED ORDER — OZEMPIC (0.25 OR 0.5 MG/DOSE) 2 MG/1.5ML ~~LOC~~ SOPN: 0.5000 mg | PEN_INJECTOR | SUBCUTANEOUS | 3 refills | Status: DC

## 2019-02-05 NOTE — Addendum Note (Signed)
Addended byDamita Dunnings D on: 02/05/2019 09:29 AM   Modules accepted: Orders

## 2019-02-26 ENCOUNTER — Encounter: Payer: Self-pay | Admitting: Internal Medicine

## 2019-04-14 DIAGNOSIS — Z79899 Other long term (current) drug therapy: Secondary | ICD-10-CM | POA: Diagnosis not present

## 2019-04-14 DIAGNOSIS — F902 Attention-deficit hyperactivity disorder, combined type: Secondary | ICD-10-CM | POA: Diagnosis not present

## 2019-04-14 DIAGNOSIS — G4733 Obstructive sleep apnea (adult) (pediatric): Secondary | ICD-10-CM | POA: Diagnosis not present

## 2019-05-06 ENCOUNTER — Encounter: Payer: Self-pay | Admitting: Internal Medicine

## 2019-05-06 ENCOUNTER — Other Ambulatory Visit: Payer: Self-pay

## 2019-05-06 DIAGNOSIS — Z20822 Contact with and (suspected) exposure to covid-19: Secondary | ICD-10-CM

## 2019-05-06 DIAGNOSIS — Z20828 Contact with and (suspected) exposure to other viral communicable diseases: Secondary | ICD-10-CM

## 2019-05-08 LAB — NOVEL CORONAVIRUS, NAA: SARS-CoV-2, NAA: NOT DETECTED

## 2019-06-07 ENCOUNTER — Encounter: Payer: Self-pay | Admitting: Internal Medicine

## 2019-06-07 ENCOUNTER — Ambulatory Visit: Payer: BC Managed Care – PPO | Admitting: Internal Medicine

## 2019-06-07 ENCOUNTER — Other Ambulatory Visit: Payer: Self-pay

## 2019-06-07 VITALS — BP 162/107 | HR 106 | Temp 96.2°F | Resp 16 | Ht 72.0 in | Wt 348.1 lb

## 2019-06-07 DIAGNOSIS — E1169 Type 2 diabetes mellitus with other specified complication: Secondary | ICD-10-CM | POA: Diagnosis not present

## 2019-06-07 DIAGNOSIS — G4733 Obstructive sleep apnea (adult) (pediatric): Secondary | ICD-10-CM

## 2019-06-07 DIAGNOSIS — E785 Hyperlipidemia, unspecified: Secondary | ICD-10-CM

## 2019-06-07 DIAGNOSIS — R000 Tachycardia, unspecified: Secondary | ICD-10-CM

## 2019-06-07 DIAGNOSIS — R030 Elevated blood-pressure reading, without diagnosis of hypertension: Secondary | ICD-10-CM

## 2019-06-07 LAB — BASIC METABOLIC PANEL
BUN: 18 mg/dL (ref 6–23)
CO2: 25 mEq/L (ref 19–32)
Calcium: 9.6 mg/dL (ref 8.4–10.5)
Chloride: 104 mEq/L (ref 96–112)
Creatinine, Ser: 1.09 mg/dL (ref 0.40–1.50)
GFR: 74.43 mL/min (ref >60.00)
Glucose, Bld: 199 mg/dL — ABNORMAL HIGH (ref 70–99)
Potassium: 4.5 mEq/L (ref 3.5–5.1)
Sodium: 138 mEq/L (ref 135–145)

## 2019-06-07 LAB — LDL CHOLESTEROL, DIRECT: Direct LDL: 112 mg/dL

## 2019-06-07 LAB — HEMOGLOBIN A1C: Hgb A1c MFr Bld: 8.5 % — ABNORMAL HIGH (ref 4.6–6.5)

## 2019-06-07 LAB — LIPID PANEL
Cholesterol: 217 mg/dL — ABNORMAL HIGH (ref 0–200)
HDL: 30.8 mg/dL — ABNORMAL LOW (ref >39.00)
Total CHOL/HDL Ratio: 7
Triglycerides: 466 mg/dL — ABNORMAL HIGH (ref 0.0–149.0)

## 2019-06-07 LAB — MICROALBUMIN / CREATININE URINE RATIO
Creatinine,U: 66.9 mg/dL
Microalb Creat Ratio: 1 mg/g (ref 0.0–30.0)
Microalb, Ur: <0.7 mg/dL (ref 0.0–1.9)

## 2019-06-07 MED ORDER — OZEMPIC (1 MG/DOSE) 2 MG/1.5ML ~~LOC~~ SOPN: 1.0000 mg | PEN_INJECTOR | SUBCUTANEOUS | 3 refills | Status: DC

## 2019-06-07 MED ORDER — LOSARTAN POTASSIUM 100 MG PO TABS: 100.0000 mg | ORAL_TABLET | Freq: Every day | ORAL | 0 refills | Status: DC

## 2019-06-07 NOTE — Patient Instructions (Addendum)
Per our records you are due for an eye exam. Please contact your eye doctor to schedule an appointment. Please have them send copies of your office visit notes to [place]. Our fax number is [phone].   GO TO THE LAB : Get the blood work     GO TO THE FRONT DESK Schedule your next appointment   for a checkup in 2- 3 weeks  Start losartan 100 mg: 1 tablet daily.  in 4 weeksContinue checking your blood pressures, 3 times a week. BP GOAL is between 110/65 and  135/85. If it is consistently higher or lower, let me know  Exercise: 3 hours a week.  You have to consider your change your diet  Ozempic: 0.5 mg weekly for   4 weeks then 1 mg weekly

## 2019-06-07 NOTE — Progress Notes (Signed)
Subjective:    Patient ID: Spencer Good, male    DOB: 09-12-1975, 43 y.o.   MRN: [id number]  DOS:  06/07/2019 Type of visit - description: Follow-up DM: Reports is not taking Ozempic regularly, he keeps forgetting. BP is elevated today, ambulatory BPs typically 135/90. His heart rate is also somewhat elevated, no ambulatory heart rates OSA: We revisited the issue.  Not using a CPAP   Wt Readings from Last 3 Encounters:  06/07/19 (!) 348 lb 2 oz (157.9 kg)  02/02/19 (!) 343 lb (155.6 kg)  08/05/18 (!) 348 lb 4 oz (158 kg)   BP Readings from Last 3 Encounters:  06/07/19 (!) 162/107  02/02/19 135/90  08/05/18 132/80     Review of Systems Reports some cough, no nausea, vomiting, diarrhea.  No fever or chills. At the last visit, he was losing his job, fortunately he found another one.  Stress is okay, at baseline. Denies any genital rash.  Past Medical History:  Diagnosis Date  . ADHD (attention deficit hyperactivity disorder)   . Diabetes ([place])   . Diverticulitis of colon with perforation    s/p sigmoid colectomy  . Gout   . Hernia, umbilical 4098   Laparoscopic umbilical hernia repair with mesh- s/p repair-resolved.  Marland Kitchen History of colon polyps   . Insomnia   . Obesity   . OSA (obstructive sleep apnea) dx 05-2013   Rx a Cpap 3-15, can't use - no longer has machine.    Past Surgical History:  Procedure Laterality Date  . INSERTION OF MESH N/A 02/01/2015   Procedure: INSERTION OF MESH;  Surgeon: Rolm Bookbinder, MD;  Location: [facility];  Service: General;  Laterality: N/A;  . LAPAROSCOPIC SIGMOID COLECTOMY N/A 05/24/2016   Procedure: LAPAROSCOPIC SIGMOID COLECTOMY;  Surgeon: Excell Seltzer, MD;  Location: [facility];  Service: General;  Laterality: N/A;  . LASIK Bilateral   . UMBILICAL HERNIA REPAIR N/A 02/01/2015   Procedure: LAPAROSCOPIC UMBILICAL HERNIA REPAIR WITH MESH;  Surgeon: Rolm Bookbinder, MD;  Location: [facility];  Service: General;  Laterality: N/A;     Social History   Socioeconomic History  . Marital status: Single    Spouse name: Not on file  . Number of children: 0  . Years of education: Not on file  . Highest education level: Not on file  Occupational History  . Occupation: lost job d/t Covid 01/2019  Tobacco Use  . Smoking status: Former Smoker    Packs/day: 1.00    Years: 20.00    Pack years: 20.00    Types: Cigarettes    Start date: 07/30/1992    Quit date: 12/16/2014    Years since quitting: 4.4  . Smokeless tobacco: Never Used  . Tobacco comment: + vaping , no cigarrets   Substance and Sexual Activity  . Alcohol use: Yes    Alcohol/week: 0.0 standard drinks    Comment: 09/08/2015 "I'll have a few drinks/year"  . Drug use: No    Comment: Past  experimented some in college; not a regular user when I did use-none over 15 yrs  . Sexual activity: Not Currently  Other Topics Concern  . Not on file  Social History Narrative   Lives by himself    Social Determinants of Health   Financial Resource Strain:   . Difficulty of Paying Living Expenses: Not on file  Food Insecurity:   . Worried About Charity fundraiser in the Last Year: Not on file  . Ran Out of  Food in the Last Year: Not on file  Transportation Needs:   . Lack of Transportation (Medical): Not on file  . Lack of Transportation (Non-Medical): Not on file  Physical Activity:   . Days of Exercise per Week: Not on file  . Minutes of Exercise per Session: Not on file  Stress:   . Feeling of Stress : Not on file  Social Connections:   . Frequency of Communication with Friends and Family: Not on file  . Frequency of Social Gatherings with Friends and Family: Not on file  . Attends Religious Services: Not on file  . Active Member of Clubs or Organizations: Not on file  . Attends Archivist Meetings: Not on file  . Marital Status: Not on file  Intimate Partner Violence:   . Fear of Current or Ex-Partner: Not on file  . Emotionally Abused: Not on  file  . Physically Abused: Not on file  . Sexually Abused: Not on file      Allergies as of 06/07/2019   No Known Allergies     Medication List       Accurate as of June 07, 2019 11:59 PM. If you have any questions, ask your nurse or doctor.        STOP taking these medications   Mydayis 50 MG Cp24 Generic drug: Amphet-Dextroamphet 3-Bead ER Stopped by: Kathlene November, MD   Ozempic (0.25 or 0.5 MG/DOSE) 2 MG/1.5ML Sopn Generic drug: Semaglutide(0.25 or 0.5MG/DOS) Replaced by: Ozempic (1 MG/DOSE) 2 MG/1.5ML Sopn Stopped by: Kathlene November, MD     TAKE these medications   aspirin EC 81 MG tablet Take 81 mg by mouth daily.   canagliflozin 300 MG Tabs tablet Commonly known as: Invokana Take 1 tablet (300 mg total) by mouth daily before breakfast.   fenofibrate 160 MG tablet Take 1 tablet (160 mg total) by mouth daily.   glucose blood test strip Commonly known as: OneTouch Verio Check blood sugar once daily   losartan 100 MG tablet Commonly known as: COZAAR Take 1 tablet (100 mg total) by mouth daily. Started by: Kathlene November, MD   metFORMIN 850 MG tablet Commonly known as: GLUCOPHAGE Take 1 tablet (850 mg total) by mouth 3 (three) times daily with meals.   multivitamin tablet Take 1 tablet by mouth daily. Vegan Multi-Pak   ONE TOUCH LANCETS Misc Check blood sugar once daily   OneTouch Verio Flex System w/Device Kit Check blood sugar once daily   Ozempic (1 MG/DOSE) 2 MG/1.5ML Sopn Generic drug: Semaglutide (1 MG/DOSE) Inject 1 mg into the skin once a week. Replaces: Ozempic (0.25 or 0.5 MG/DOSE) 2 MG/1.5ML Sopn Started by: Kathlene November, MD   PROBIOTIC PO Take 1 tablet by mouth daily. Taking [2] two OTC Probiotics   psyllium 58.6 % powder Commonly known as: METAMUCIL Take 1 packet daily by mouth.   Vyvanse 50 MG capsule Generic drug: lisdexamfetamine Take 50 mg by mouth daily.           Objective:   Physical Exam BP (!) 162/107 (BP Location: Left Arm)    Pulse (!) 106   Temp (!) 96.2 F (35.7 C) (Temporal)   Resp 16   Ht 6' (1.829 m)   Wt (!) 348 lb 2 oz (157.9 kg)   SpO2 99%   BMI 47.21 kg/m  General:   Well developed, NAD, BMI noted, morbidly obese appearing. HEENT:  Normocephalic . Face symmetric, atraumatic Lungs:  CTA B Normal respiratory effort, no intercostal  retractions, no accessory muscle use. Heart: RRR,  no murmur.  no pretibial edema bilaterally  Abdomen:  Not distended, soft, non-tender. No rebound or rigidity.   Skin: Not pale. Not jaundice Neurologic:  alert & oriented X3.  Speech normal, gait appropriate for age and unassisted Psych--  Cognition and judgment appear intact.  Cooperative with normal attention span and concentration.  Behavior appropriate. No anxious or depressed appearing.     Assessment    Assessment DM-- dx 2014, took invokana, metformin, dc ~06-2014 as diet improved, a1c 13 ( 08-2015), meds restarted Gout Morbid obesity OSA- intolerant to cpap ADHD- per other provider  Insomnia H/o diverticulitis, sigmoid,w/ perf, conservative Rx 10-2014 , Admitted again 08/2015, abscess, s/p drainage. Tobacco- wellbutrin intolerant   PLAN: DM: On Metformin, Invokana and Ozempic, reports poor compliance with Ozempic because he forgets. Admits to poor diet and not exercising. Plan:  Encourage Ozempic weekly, will go from 0.39m to 1 mg weekly. Strongly encourage to stay active 3 hours a week Offered nutritionist referral: declined.  Asked to consider weight watchers Consider Endo referral Check micro, BMP A1c Elevated BP: Recently switched to Maydias to Vyvanse for ADD, related?  BP recheck after arrival: 162/107.  Ambulatory BPs (diastolic) slightly elevated, start losartan 100 mg checking micro, RTC 2-3 weeks for BP check and labs  Dyslipidemia: On fenofibrate, checking FLP, strongly consider Lipitor. OSA: He quit CPAP because he was taking the mask in the middle of the night.  Refer to pulmonary,  is vital his OSA is treated even if he only uses a CPAP few hours at night. Morbid obesity: See comments under DM RTC 2-3 weeks   This visit occurred during the SARS-CoV-2 public health emergency.  Safety protocols were in place, including screening questions prior to the visit, additional usage of staff PPE, and extensive cleaning of exam room while observing appropriate contact time as indicated for disinfecting solutions.

## 2019-06-07 NOTE — Progress Notes (Signed)
Pre visit review using our clinic review tool, if applicable. No additional management support is needed unless otherwise documented below in the visit note. 

## 2019-06-08 ENCOUNTER — Ambulatory Visit (INDEPENDENT_AMBULATORY_CARE_PROVIDER_SITE_OTHER)
Admission: RE | Admit: 2019-06-08 | Discharge: 2019-06-08 | Disposition: A | Discharge status: Home / Self Care | Payer: BC Managed Care – PPO | Source: Ambulatory Visit

## 2019-06-08 DIAGNOSIS — M545 Low back pain, unspecified: Secondary | ICD-10-CM

## 2019-06-08 MED ORDER — IBUPROFEN 800 MG PO TABS: 800.0000 mg | ORAL_TABLET | Freq: Three times a day (TID) | ORAL | 0 refills | Status: DC | PRN

## 2019-06-08 MED ORDER — CYCLOBENZAPRINE HCL 10 MG PO TABS: 10.0000 mg | ORAL_TABLET | Freq: Two times a day (BID) | ORAL | 0 refills | Status: DC | PRN

## 2019-06-08 NOTE — Assessment & Plan Note (Addendum)
DM: On Metformin, Invokana and Ozempic, reports poor compliance with Ozempic because he forgets. Admits to poor diet and not exercising. Plan:  Encourage Ozempic weekly, will go from 0.5mg  to 1 mg weekly. Strongly encourage to stay active 3 hours a week Offered nutritionist referral: declined.  Asked to consider weight watchers Consider Endo referral Check micro, BMP A1c Elevated BP: Recently switched to Maydias to Vyvanse for ADD, related?  BP recheck after arrival: 162/107.  Ambulatory BPs (diastolic) slightly elevated, start losartan 100 mg checking micro, RTC 2-3 weeks for BP check and labs  Dyslipidemia: On fenofibrate, checking FLP, strongly consider Lipitor. OSA: He quit CPAP because he was taking the mask in the middle of the night.  Refer to pulmonary, is vital his OSA is treated even if he only uses a CPAP few hours at night. Morbid obesity: See comments under DM RTC 2-3 weeks

## 2019-06-08 NOTE — Discharge Instructions (Addendum)
Take the prescribed ibuprofen as needed for your pain.  Take the muscle relaxer Flexeril as needed for muscle spasm; do not drive, operate machinery, or drink alcohol with this medication as it may make you drowsy.    Come here to be seen in person if your pain is not improving.  Go to the emergency department if you have worsening pain or develop new symptoms such as difficulty with urination, weakness, numbness, loss of control of your bladder or bowels, fever, or chills.

## 2019-06-08 NOTE — ED Provider Notes (Signed)
Virtual Visit via Video Note:  DEVLYN PLACEK  initiated request for Telemedicine visit with [facility] Urgent [facility] team. I connected with [name]  on 06/08/2019 at 12:08 PM  for a synchronized telemedicine visit using a video enabled HIPPA compliant telemedicine application. I verified that I am speaking with [name]  using two identifiers. Sharion Balloon, NP  was physically located in a [facility] Urgent care site and NECALLI STOUP was located at a different location.   The limitations of evaluation and management by telemedicine as well as the availability of in-person appointments were discussed. Patient was informed that he  may incur a bill ( including co-pay) for this virtual visit encounter. JAVEN BALDASSARE  expressed understanding and gave verbal consent to proceed with virtual visit.     History of Present Illness:Pauline R Leisy  is a 43 y.o. male presents for evaluation of intermittent lower back pain x several weeks.  The pain has been worse x 2 days.  He attributes this to sitting in front of the computer several hours a day.  He denies falls or injury.  He denies numbness, tingling, or weakness in LE.  He denies bowel/bladder incontinence, dysuria, abdominal pain, fever, chills, penile discharge, or testicular pain.  He has attempted treatment at home with Tylenol, ibuprofen, Icy Hot, Voltaren.     No Known Allergies   Past Medical History:  Diagnosis Date  . ADHD (attention deficit hyperactivity disorder)   . Diabetes ([place])   . Diverticulitis of colon with perforation    s/p sigmoid colectomy  . Gout   . Hernia, umbilical [id number]   Laparoscopic umbilical hernia repair with mesh- s/p repair-resolved.  Marland Kitchen History of colon polyps   . Insomnia   . Obesity   . OSA (obstructive sleep apnea) dx 05-2013   Rx a Cpap 3-15, can't use - no longer has machine.     Social History   Tobacco Use  . Smoking status: Former Smoker    Packs/day: 1.00    Years: 20.00   Pack years: 20.00    Types: Cigarettes    Start date: 07/30/1992    Quit date: 12/16/2014    Years since quitting: 4.4  . Smokeless tobacco: Never Used  . Tobacco comment: + vaping , no cigarrets   Substance Use Topics  . Alcohol use: Yes    Alcohol/week: 0.0 standard drinks    Comment: 09/08/2015 "I'll have a few drinks/year"  . Drug use: No    Comment: Past  experimented some in college; not a regular user when I did use-none over 15 yrs        Observations/Objective: Physical Exam  VITALS: Patient denies fever. GENERAL: Alert, appears well and in no acute distress. HEENT: Atraumatic. NECK: Normal movements of the head and neck. CARDIOPULMONARY: No increased WOB. Speaking in clear sentences. I:E ratio WNL.  MS: Moves all visible extremities without noticeable abnormality. PSYCH: Pleasant and cooperative, well-groomed. Speech normal rate and rhythm. Affect is appropriate. Insight and judgement are appropriate. Attention is focused, linear, and appropriate.  NEURO: CN grossly intact. Oriented as arrived to appointment on time with no prompting. Moves both UE equally.  SKIN: No obvious lesions, wounds, erythema, or cyanosis noted on face or hands.   Assessment and Plan:    ICD-10-CM   1. Acute bilateral low back pain without sciatica  M54.5        Follow Up Instructions: Treating with ibuprofen and Flexeril.  Precautions for drowsiness with Flexeril discussed with patient.  Instructed patient to be seen in person at the urgent care or by his PCP if his symptoms are not improving.  Instructed him to go to the emergency department if he has worsening pain or new symptoms such as difficulty with urination, weakness, numbness, bowel/bladder incontinence, fever, or other concerning symptoms.  Patient agrees to this plan of care.      I discussed the assessment and treatment plan with the patient. The patient was provided an opportunity to ask questions and all were answered. The  patient agreed with the plan and demonstrated an understanding of the instructions.   The patient was advised to call back or seek an in-person evaluation if the symptoms worsen or if the condition fails to improve as anticipated.      Sharion Balloon, NP  06/08/2019 12:08 PM         Sharion Balloon, NP 06/08/19 [phone]

## 2019-07-07 ENCOUNTER — Ambulatory Visit: Payer: BC Managed Care – PPO | Admitting: Internal Medicine

## 2019-07-07 ENCOUNTER — Encounter: Payer: Self-pay | Admitting: Internal Medicine

## 2019-07-07 ENCOUNTER — Other Ambulatory Visit: Payer: Self-pay

## 2019-07-07 VITALS — BP 115/75 | HR 112 | Temp 97.3°F | Resp 16 | Ht 72.0 in | Wt 348.5 lb

## 2019-07-07 DIAGNOSIS — E1169 Type 2 diabetes mellitus with other specified complication: Secondary | ICD-10-CM | POA: Diagnosis not present

## 2019-07-07 DIAGNOSIS — Z09 Encounter for follow-up examination after completed treatment for conditions other than malignant neoplasm: Secondary | ICD-10-CM

## 2019-07-07 DIAGNOSIS — E785 Hyperlipidemia, unspecified: Secondary | ICD-10-CM | POA: Diagnosis not present

## 2019-07-07 DIAGNOSIS — I10 Essential (primary) hypertension: Secondary | ICD-10-CM | POA: Diagnosis not present

## 2019-07-07 NOTE — Progress Notes (Addendum)
Subjective:    Patient ID: Spencer Good, male    DOB: [birth year], 44 y.o.   MRN: [id number]  DOS:  07/07/2019 Type of visit - description: Follow-up Today we addressed his diabetes and hypertension. We review his ambulatory BPs   Wt Readings from Last 3 Encounters:  07/07/19 (!) 348 lb 8 oz (158.1 kg)  06/07/19 (!) 348 lb 2 oz (157.9 kg)  02/02/19 (!) 343 lb (155.6 kg)    Review of Systems Denies chest pain, difficulty breathing or palpitations  Past Medical History:  Diagnosis Date  . ADHD (attention deficit hyperactivity disorder)   . Diabetes ([place])   . Diverticulitis of colon with perforation    s/p sigmoid colectomy  . Gout   . Hernia, umbilical [id number]   Laparoscopic umbilical hernia repair with mesh- s/p repair-resolved.  Marland Kitchen History of colon polyps   . Insomnia   . Obesity   . OSA (obstructive sleep apnea) dx 05-2013   Rx a Cpap 3-15, can't use - no longer has machine.    Past Surgical History:  Procedure Laterality Date  . INSERTION OF MESH N/A 02/01/2015   Procedure: INSERTION OF MESH;  Surgeon: Rolm Bookbinder, MD;  Location: [facility];  Service: General;  Laterality: N/A;  . LAPAROSCOPIC SIGMOID COLECTOMY N/A 05/24/2016   Procedure: LAPAROSCOPIC SIGMOID COLECTOMY;  Surgeon: Excell Seltzer, MD;  Location: [facility];  Service: General;  Laterality: N/A;  . LASIK Bilateral   . UMBILICAL HERNIA REPAIR N/A 02/01/2015   Procedure: LAPAROSCOPIC UMBILICAL HERNIA REPAIR WITH MESH;  Surgeon: Rolm Bookbinder, MD;  Location: [facility];  Service: General;  Laterality: N/A;        Objective:   Physical Exam BP (!) 154/106 (BP Location: Left Arm, Patient Position: Sitting, Cuff Size: Normal)   Pulse (!) 112   Temp (!) 97.3 F (36.3 C) (Temporal)   Resp 16   Ht 6' (1.829 m)   Wt (!) 348 lb 8 oz (158.1 kg)   SpO2 99%   BMI 47.27 kg/m  General:   Well developed, NAD, BMI noted. HEENT:  Normocephalic . Face symmetric, atraumatic Lungs:  CTA B Normal respiratory  effort, no intercostal retractions, no accessory muscle use. Heart: RRR,  no murmur.  No pretibial edema bilaterally  Skin: Not pale. Not jaundice Neurologic:  alert & oriented X3.  Speech normal, gait appropriate for age and unassisted Psych--  Cognition and judgment appear intact.  Cooperative with normal attention span and concentration.  Behavior appropriate. No anxious or depressed appearing.      Assessment     Assessment DM-- dx 2014, took invokana, metformin, dc ~06-2014 as diet improved, a1c 13 ( 08-2015), meds restarted Gout Morbid obesity OSA- intolerant to cpap ADHD- per other provider  Insomnia H/o diverticulitis, sigmoid,w/ perf, conservative Rx 10-2014 , Admitted again 08/2015, abscess, s/p drainage. Tobacco- wellbutrin intolerant   PLAN: DM: Not  Controlled. Since the last visit he is doing better with lifestyle, reports better compliance with Invokana, Metformin and Ozempic.  All meds are rx at top doses.  CBGs in the morning sometimes in the 200s, nonfasting typically lower. Plan: Continue improving diet, exercise.  Encourage good compliance with medication.  Reassess in 6 weeks. HTN:  Not controlled . On 06/07/2019, BP was elevated, he was Rx losartan, BP today is still elevated.  I rechecked on the L arm: 150/75.  HR was also elevated at around 100. Both the BP and heart rate and increase since he started Vyvanse however  he is reluctant to stop Vyvanse because he is really helping his ADHD.  We will check a BMP, continue losartan for now and monitor BPs at home. Dyslipidemia: Not at goal, we will few more weeks before recheck.  Continue fenofibrate RTC 6 weeks   This visit occurred during the SARS-CoV-2 public health emergency.  Safety protocols were in place, including screening questions prior to the visit, additional usage of staff PPE, and extensive cleaning of exam room while observing appropriate contact time as indicated for disinfecting solutions.

## 2019-07-07 NOTE — Progress Notes (Signed)
Pre visit review using our clinic review tool, if applicable. No additional management support is needed unless otherwise documented below in the visit note. 

## 2019-07-07 NOTE — Patient Instructions (Addendum)
Per our records you are due for an eye exam. Please contact your eye doctor to schedule an appointment. Please have them send copies of your office visit notes to [place]. Our fax number is [phone].  GO TO THE LAB : Get the blood work     GO TO THE FRONT DESK Schedule your next appointment   for a checkup in 6 weeks  Check your blood pressure twice a week BP GOAL is between 110/65 and  135/85. If it is consistently higher or lower, let me know  Diabetes: Check your blood sugar  once a day  at different times of the day  GOALS: Fasting before a meal 70- 130 2 hours after a meal less than 180   DIABETES self learn tools:  Online resources: The American diabetes Association     diabetes.[facility] website it is a Microbiologist  [place].org  The [facility] web site has a diabetes section  [employer]

## 2019-07-08 LAB — BASIC METABOLIC PANEL
BUN: 16 mg/dL (ref 6–23)
CO2: 24 mEq/L (ref 19–32)
Calcium: 10.2 mg/dL (ref 8.4–10.5)
Chloride: 100 mEq/L (ref 96–112)
Creatinine, Ser: 0.97 mg/dL (ref 0.40–1.50)
GFR: 85.12 mL/min (ref >60.00)
Glucose, Bld: 113 mg/dL — ABNORMAL HIGH (ref 70–99)
Potassium: 4 mEq/L (ref 3.5–5.1)
Sodium: 133 mEq/L — ABNORMAL LOW (ref 135–145)

## 2019-07-09 DIAGNOSIS — E785 Hyperlipidemia, unspecified: Secondary | ICD-10-CM | POA: Insufficient documentation

## 2019-07-09 NOTE — Assessment & Plan Note (Addendum)
DM: Not  Controlled. Since the last visit he is doing better with lifestyle, reports better compliance with Invokana, Metformin and Ozempic.  All meds are rx at top doses.  CBGs in the morning sometimes in the 200s, nonfasting typically lower. Plan: Continue improving diet, exercise.  Encourage good compliance with medication.  Reassess in 6 weeks. HTN:  Not controlled . On 06/07/2019, BP was elevated, he was Rx losartan, BP today is still elevated.  I rechecked on the L arm: 150/75.  HR was also elevated at around 100. Both the BP and heart rate and increase since he started Vyvanse however he is reluctant to stop Vyvanse because he is really helping his ADHD.  We will check a BMP, continue losartan for now and monitor BPs at home. Dyslipidemia: Not at goal, we will few more weeks before recheck.  Continue fenofibrate RTC 6 weeks

## 2019-07-12 ENCOUNTER — Other Ambulatory Visit: Payer: Self-pay | Admitting: Internal Medicine

## 2019-07-14 DIAGNOSIS — F902 Attention-deficit hyperactivity disorder, combined type: Secondary | ICD-10-CM | POA: Diagnosis not present

## 2019-07-14 DIAGNOSIS — Z79899 Other long term (current) drug therapy: Secondary | ICD-10-CM | POA: Diagnosis not present

## 2019-07-14 DIAGNOSIS — G4733 Obstructive sleep apnea (adult) (pediatric): Secondary | ICD-10-CM | POA: Diagnosis not present

## 2019-07-14 DIAGNOSIS — F419 Anxiety disorder, unspecified: Secondary | ICD-10-CM | POA: Diagnosis not present

## 2019-07-26 ENCOUNTER — Ambulatory Visit: Payer: BC Managed Care – PPO | Attending: Internal Medicine

## 2019-07-26 DIAGNOSIS — Z20822 Contact with and (suspected) exposure to covid-19: Secondary | ICD-10-CM

## 2019-07-27 LAB — NOVEL CORONAVIRUS, NAA: SARS-CoV-2, NAA: NOT DETECTED

## 2019-07-28 NOTE — Progress Notes (Signed)
Sorry, please disregard. It is negative.

## 2019-08-07 ENCOUNTER — Other Ambulatory Visit: Payer: Self-pay | Admitting: Internal Medicine

## 2019-08-18 ENCOUNTER — Encounter: Payer: Self-pay | Admitting: Internal Medicine

## 2019-09-08 ENCOUNTER — Other Ambulatory Visit: Payer: Self-pay | Admitting: Internal Medicine

## 2019-10-04 ENCOUNTER — Other Ambulatory Visit: Payer: Self-pay

## 2019-10-04 ENCOUNTER — Encounter: Payer: Self-pay | Admitting: Internal Medicine

## 2019-10-04 ENCOUNTER — Ambulatory Visit: Payer: BC Managed Care – PPO | Admitting: Internal Medicine

## 2019-10-04 VITALS — BP 118/80 | HR 101 | Temp 98.0°F | Ht 72.0 in | Wt 342.0 lb

## 2019-10-04 DIAGNOSIS — G4733 Obstructive sleep apnea (adult) (pediatric): Secondary | ICD-10-CM | POA: Diagnosis not present

## 2019-10-04 DIAGNOSIS — G4752 REM sleep behavior disorder: Secondary | ICD-10-CM

## 2019-10-04 DIAGNOSIS — F17200 Nicotine dependence, unspecified, uncomplicated: Secondary | ICD-10-CM

## 2019-10-04 NOTE — Progress Notes (Signed)
10/04/19- 53 yoM former  Smoker Cigarettes/ vaper for sleep evaluation. Previously last saw Dr Gwenette Greet for OSA in 2015 and had trouble with mask comfort/ compliance at that time. Medical problem list includes DM2, ADD, Dyslipidemia, Morbid Obesity, Sigmoid Diverticulitis/ Sigmoid Colectomy, Gout,  HST 06/16/13- AHI 33.1/ hr, desaturation to 71%, body weight 356 lbs He had worked with CPAP auto 5-20, [employer] then [employer], and had been referred to Dr Ron Parker to consider oral appliance for OSA.- never done. Body weight today 342 lbs 2Covax He sleeps alone, but is aware he tosses and turns, disrupting bedding. Frequent waking.No sleep walking or punching recognized. Previously he worked with multiple different masks styles, but couldn't keep mask on face. Estimates 6-7 hrs sleep. Uses occ melatonin. Denies ENT surgery, cardiopulmonary disease, neurologic disorder or seizure. Dates difficulty initiating and maintaining sleep to time of school age trip to [place].  Prior to Admission medications   Medication Sig Start Date End Date Taking? Authorizing Provider  aspirin EC 81 MG tablet Take 81 mg by mouth daily.   Yes [provider]  Blood Glucose Monitoring Suppl ([place]) w/Device KIT Check blood sugar once daily 12/31/17  Yes Paz, Alda Berthold, MD  canagliflozin [facility]) 300 MG TABS tablet Take 1 tablet (300 mg total) by mouth daily before breakfast. 08/09/19  Yes Paz, Alda Berthold, MD  cyclobenzaprine (FLEXERIL) 10 MG tablet Take 1 tablet (10 mg total) by mouth 2 (two) times daily as needed for muscle spasms. 06/08/19  Yes Sharion Balloon, NP  fenofibrate 160 MG tablet Take 1 tablet (160 mg total) by mouth daily. 08/09/19  Yes Paz, Alda Berthold, MD  glucose blood [facility] VERIO) test strip Check blood sugar once daily 12/31/17  Yes Paz, Alda Berthold, MD  ibuprofen (ADVIL) 800 MG tablet Take 1 tablet (800 mg total) by mouth every 8 (eight) hours as needed. 06/08/19  Yes Sharion Balloon, NP  losartan  (COZAAR) 100 MG tablet Take 1 tablet (100 mg total) by mouth daily. 09/08/19  Yes Colon Branch, MD  metFORMIN (GLUCOPHAGE) 850 MG tablet Take 1 tablet (850 mg total) by mouth 3 (three) times daily with meals. 08/09/19  Yes Colon Branch, MD  Multiple Vitamin (MULTIVITAMIN) tablet Take 1 tablet by mouth daily. Vegan Multi-Pak   Yes [provider]  ONE TOUCH LANCETS MISC Check blood sugar once daily 12/31/17  Yes Paz, Alda Berthold, MD  Probiotic Product (PROBIOTIC PO) Take 1 tablet by mouth daily. Taking [2] two OTC Probiotics   Yes [provider]  psyllium (METAMUCIL) 58.6 % powder Take 1 packet daily by mouth.    Yes [provider]  Semaglutide, 1 MG/DOSE, (OZEMPIC, 1 MG/DOSE,) 2 MG/1.5ML SOPN Inject 1 mg into the skin once a week. 06/07/19  Yes Paz, Alda Berthold, MD  VYVANSE 50 MG capsule Take 50 mg by mouth daily. 05/14/19  Yes [provider]   Past Medical History:  Diagnosis Date  . ADHD (attention deficit hyperactivity disorder)   . Diabetes ([place])   . Diverticulitis of colon with perforation    s/p sigmoid colectomy  . Gout   . Hernia, umbilical 9678   Laparoscopic umbilical hernia repair with mesh- s/p repair-resolved.  Marland Kitchen History of colon polyps   . Insomnia   . Obesity   . OSA (obstructive sleep apnea) dx 05-2013   Rx a Cpap 3-15, can't use - no longer has machine.   Past Surgical History:  Procedure Laterality Date  .  INSERTION OF MESH N/A 02/01/2015   Procedure: INSERTION OF MESH;  Surgeon: Rolm Bookbinder, MD;  Location: [place];  Service: General;  Laterality: N/A;  . LAPAROSCOPIC SIGMOID COLECTOMY N/A 05/24/2016   Procedure: LAPAROSCOPIC SIGMOID COLECTOMY;  Surgeon: Excell Seltzer, MD;  Location[facility];  Service: General;  Laterality: N/A;  . LASIK Bilateral   . UMBILICAL HERNIA REPAIR N/A 02/01/2015   Procedure: LAPAROSCOPIC UMBILICAL HERNIA REPAIR WITH MESH;  Surgeon: Rolm Bookbinder, MD;  [facility];  Service: General;  Laterality: N/A;    Family History  Problem Relation Age of Onset  . Stroke Father        F, PGF  . CAD Other        MGF  . Heart disease Maternal Grandfather   . Colon cancer Neg Hx   . Prostate cancer Neg Hx   . Diabetes Neg Hx        distant fam members  . Colon polyps Neg Hx   . Esophageal cancer Neg Hx   . Gallbladder disease Neg Hx   . Kidney disease Neg Hx    Social History   Socioeconomic History  . Marital status: Single    Spouse name: Not on file  . Number of children: 0  . Years of education: Not on file  . Highest education level: Not on file  Occupational History  . Occupation: lost job d/t Covid 01/2019  Tobacco Use  . Smoking status: Former Smoker    Packs/day: 1.00    Years: 20.00    Pack years: 20.00    Types: Cigarettes    Start date: 07/30/1992    Quit date: 12/16/2014    Years since quitting: 4.8  . Smokeless tobacco: Never Used  . Tobacco comment: + vaping , no cigarrets   Substance and Sexual Activity  . Alcohol use: Yes    Alcohol/week: 0.0 standard drinks    Comment: 09/08/2015 "I'll have a few drinks/year"  . Drug use: No    Comment: Past  experimented some in college; not a regular user when I did use-none over 15 yrs  . Sexual activity: Not Currently  Other Topics Concern  . Not on file  Social History Narrative   Lives by himself    Social Determinants of Health   Financial Resource Strain:   . Difficulty of Paying Living Expenses:   Food Insecurity:   . Worried About Charity fundraiser in the Last Year:   . Arboriculturist in the Last Year:   Transportation Needs:   . Film/video editor (Medical):   Marland Kitchen Lack of Transportation (Non-Medical):   Physical Activity:   . Days of Exercise per Week:   . Minutes of Exercise per Session:   Stress:   . Feeling of Stress :   Social Connections:   . Frequency of Communication with Friends and Family:   . Frequency of Social Gatherings with Friends and Family:   . Attends Religious Services:   .  Active Member of Clubs or Organizations:   . Attends Archivist Meetings:   Marland Kitchen Marital Status:   Intimate Partner Violence:   . Fear of Current or Ex-Partner:   . Emotionally Abused:   Marland Kitchen Physically Abused:   . Sexually Abused:    ROS-see HPI   + = positive Constitutional:    weight loss, night sweats, fevers, chills, fatigue, lassitude. HEENT:    headaches, difficulty swallowing, tooth/dental problems, sore throat,  sneezing, itching, ear ache, nasal congestion, post nasal drip, snoring CV:    chest pain, orthopnea, PND, swelling in lower extremities, anasarca,                                  dizziness, palpitations Resp:   shortness of breath with exertion or at rest.                productive cough,   non-productive cough, coughing up of blood.              change in color of mucus.  wheezing.   Skin:    rash or lesions. GI:  No-   heartburn, indigestion, abdominal pain, nausea, vomiting, diarrhea,                 change in bowel habits, loss of appetite GU: dysuria, change in color of urine, no urgency or frequency.   flank pain. MS:   +joint pain, stiffness, decreased range of motion, back pain. Neuro-     nothing unusual Psych:  change in mood or affect.  depression or anxiety.   memory loss.  OBJ- Physical Exam General- Alert, Oriented, Affect-appropriate, Distress- none acute, + morbid obesity Skin- rash-none, lesions- none, excoriation- none Lymphadenopathy- none Head- atraumatic            Eyes- Gross vision intact, PERRLA, conjunctivae and secretions clear            Ears- Hearing, canals-normal            Nose- Clear, no-Septal dev, mucus, polyps, erosion, perforation             Throat- Mallampati IV , mucosa clear , drainage- none, tonsils- atrophic, + teeth Neck- flexible , trachea midline, no stridor , thyroid nl, carotid no bruit Chest - symmetrical excursion , unlabored           Heart/CV- RRR , no murmur , no gallop  , no rub, nl s1 s2                            - JVD- none , edema- none, stasis changes- none, varices- none           Lung- clear to P&A, wheeze- none, cough- none , dullness-none, rub- none           Chest wall-  Abd-  Br/ Gen/ Rectal- Not done, not indicated Extrem- cyanosis- none, clubbing, none, atrophy- none, strength- nl Neuro- grossly intact to observation

## 2019-10-04 NOTE — Patient Instructions (Signed)
Order- Schedule Split night sleep study   DX OSA, REM Behavior Disorder  Please call if we can help

## 2019-10-05 ENCOUNTER — Other Ambulatory Visit: Payer: Self-pay

## 2019-10-05 DIAGNOSIS — G4752 REM sleep behavior disorder: Secondary | ICD-10-CM

## 2019-10-05 DIAGNOSIS — G4733 Obstructive sleep apnea (adult) (pediatric): Secondary | ICD-10-CM

## 2019-10-05 NOTE — Assessment & Plan Note (Signed)
Issue is whether his movement at night is from uncontrolled OSA, or a separate sleep movement disorder such as RBD. Best management will be to get Split night Sleep study at [place], where he can be observed and CPAP titrated for control, with attention to O2 levels.

## 2019-10-05 NOTE — Assessment & Plan Note (Signed)
He realizes his weight is a problem. We discussed impact on OSA

## 2019-10-05 NOTE — Progress Notes (Signed)
home

## 2019-10-05 NOTE — Assessment & Plan Note (Signed)
He indicates he is in remission and I encouraged him to stay off all tobacco products.

## 2019-10-06 ENCOUNTER — Other Ambulatory Visit: Payer: Self-pay | Admitting: Internal Medicine

## 2019-10-11 ENCOUNTER — Telehealth: Payer: Self-pay | Admitting: Internal Medicine

## 2019-10-11 MED ORDER — FENOFIBRATE 160 MG PO TABS: 160.0000 mg | ORAL_TABLET | Freq: Every day | ORAL | 0 refills | Status: DC

## 2019-10-11 MED ORDER — CANAGLIFLOZIN 300 MG PO TABS: 300.0000 mg | ORAL_TABLET | Freq: Every day | ORAL | 0 refills | Status: DC

## 2019-10-11 MED ORDER — METFORMIN HCL 850 MG PO TABS: 850.0000 mg | ORAL_TABLET | Freq: Three times a day (TID) | ORAL | 0 refills | Status: DC

## 2019-10-11 MED ORDER — LOSARTAN POTASSIUM 100 MG PO TABS: 100.0000 mg | ORAL_TABLET | Freq: Every day | ORAL | 0 refills | Status: DC

## 2019-10-11 NOTE — Telephone Encounter (Signed)
Medication: metFORMIN (GLUCOPHAGE) 850 MG tablet [id number]   losartan (COZAAR) 100 MG tablet  fenofibrate 160 MG tablet canagliflozin (INVOKANA) 300 MG TABS tablet     Has the patient contacted their pharmacy? No. (If no, request that the patient contact the pharmacy for the refill.) (If yes, when and what did the pharmacy advise?)  Preferred Pharmacy (with phone number or street name):  [facility] [place] AT [address] [facility]  [place], [place] [address]  Phone:  [phone] Fax:  [phone]  DEA #:  [id number]       Agent: Please be advised that RX refills may take up to 3 business days. We ask that you follow-up with your pharmacy.

## 2019-10-11 NOTE — Telephone Encounter (Signed)
30 day supplies sent.

## 2019-10-14 ENCOUNTER — Other Ambulatory Visit: Payer: Self-pay

## 2019-10-14 ENCOUNTER — Encounter: Payer: Self-pay | Admitting: Internal Medicine

## 2019-10-14 ENCOUNTER — Ambulatory Visit: Payer: BC Managed Care – PPO | Admitting: Internal Medicine

## 2019-10-14 VITALS — BP 140/84 | HR 106 | Temp 97.6°F | Resp 18 | Ht 72.0 in | Wt 338.4 lb

## 2019-10-14 DIAGNOSIS — E1165 Type 2 diabetes mellitus with hyperglycemia: Secondary | ICD-10-CM | POA: Diagnosis not present

## 2019-10-14 DIAGNOSIS — F419 Anxiety disorder, unspecified: Secondary | ICD-10-CM | POA: Diagnosis not present

## 2019-10-14 DIAGNOSIS — Z79899 Other long term (current) drug therapy: Secondary | ICD-10-CM | POA: Diagnosis not present

## 2019-10-14 DIAGNOSIS — I10 Essential (primary) hypertension: Secondary | ICD-10-CM

## 2019-10-14 DIAGNOSIS — E785 Hyperlipidemia, unspecified: Secondary | ICD-10-CM | POA: Diagnosis not present

## 2019-10-14 DIAGNOSIS — G4733 Obstructive sleep apnea (adult) (pediatric): Secondary | ICD-10-CM | POA: Diagnosis not present

## 2019-10-14 DIAGNOSIS — F902 Attention-deficit hyperactivity disorder, combined type: Secondary | ICD-10-CM | POA: Diagnosis not present

## 2019-10-14 LAB — CBC WITH DIFFERENTIAL/PLATELET
Basophils Absolute: 0.1 10*3/uL (ref 0.0–0.1)
Basophils Relative: 0.6 % (ref 0.0–3.0)
Eosinophils Absolute: 0.2 10*3/uL (ref 0.0–0.7)
Eosinophils Relative: 2.7 % (ref 0.0–5.0)
HCT: 44.5 % (ref 39.0–52.0)
Hemoglobin: 14.7 g/dL (ref 13.0–17.0)
Lymphocytes Relative: 38.2 % (ref 12.0–46.0)
Lymphs Abs: 3.5 10*3/uL (ref 0.7–4.0)
MCHC: 32.9 g/dL (ref 30.0–36.0)
MCV: 83.5 fl (ref 78.0–100.0)
Monocytes Absolute: 0.7 10*3/uL (ref 0.1–1.0)
Monocytes Relative: 7.3 % (ref 3.0–12.0)
Neutro Abs: 4.6 10*3/uL (ref 1.4–7.7)
Neutrophils Relative %: 51.2 % (ref 43.0–77.0)
Platelets: 263 10*3/uL (ref 150.0–400.0)
RBC: 5.33 Mil/uL (ref 4.22–5.81)
RDW: 14.7 % (ref 11.5–15.5)
WBC: 9.1 10*3/uL (ref 4.0–10.5)

## 2019-10-14 LAB — HEMOGLOBIN A1C: Hgb A1c MFr Bld: 7.4 % — ABNORMAL HIGH (ref 4.6–6.5)

## 2019-10-14 LAB — COMPREHENSIVE METABOLIC PANEL
ALT: 34 U/L (ref 0–53)
AST: 18 U/L (ref 0–37)
Albumin: 4.5 g/dL (ref 3.5–5.2)
Alkaline Phosphatase: 70 U/L (ref 39–117)
BUN: 21 mg/dL (ref 6–23)
CO2: 26 mEq/L (ref 19–32)
Calcium: 9.5 mg/dL (ref 8.4–10.5)
Chloride: 104 mEq/L (ref 96–112)
Creatinine, Ser: 0.96 mg/dL (ref 0.40–1.50)
GFR: 86.03 mL/min (ref >60.00)
Glucose, Bld: 108 mg/dL — ABNORMAL HIGH (ref 70–99)
Potassium: 4.6 mEq/L (ref 3.5–5.1)
Sodium: 137 mEq/L (ref 135–145)
Total Bilirubin: 0.3 mg/dL (ref 0.2–1.2)
Total Protein: 7 g/dL (ref 6.0–8.3)

## 2019-10-14 LAB — LIPID PANEL
Cholesterol: 204 mg/dL — ABNORMAL HIGH (ref 0–200)
HDL: 26.4 mg/dL — ABNORMAL LOW (ref >39.00)
Total CHOL/HDL Ratio: 8
Triglycerides: 437 mg/dL — ABNORMAL HIGH (ref 0.0–149.0)

## 2019-10-14 LAB — LDL CHOLESTEROL, DIRECT: Direct LDL: 123 mg/dL

## 2019-10-14 NOTE — Progress Notes (Signed)
Pre visit review using our clinic review tool, if applicable. No additional management support is needed unless otherwise documented below in the visit note. 

## 2019-10-14 NOTE — Patient Instructions (Addendum)
Per our records you are due for an eye exam. Please contact your eye doctor to schedule an appointment. Please have them send copies of your office visit notes to [place]. Our fax number is [phone].  Continue checking your blood pressures BP GOAL is between 110/65 and  135/85. If it is consistently higher or lower, let me know    GO TO THE LAB : Get the blood work     [place], [place] back for a physical exam in 3 to 4 months

## 2019-10-14 NOTE — Progress Notes (Signed)
Subjective:    Patient ID: Spencer Good, male    DOB: Aug 16, 1975, 44 y.o.   MRN: [id number]  DOS:  10/14/2019 Type of visit - description: Follow-up Feels well. Today we talked about diabetes, hypertension, snoring. We went together over his ambulatory blood pressure and CBGs.  Wt Readings from Last 3 Encounters:  10/14/19 (!) 338 lb 6 oz (153.5 kg)  10/04/19 (!) 342 lb (155.1 kg)  07/07/19 (!) 348 lb 8 oz (158.1 kg)   BP Readings from Last 3 Encounters:  10/14/19 140/84  10/04/19 118/80  07/07/19 115/75     Review of Systems   Other than a left shoulder pain he is doing well.   Diagnosis Date  . ADHD (attention deficit hyperactivity disorder)   . Diabetes ([place])   . Diverticulitis of colon with perforation    s/p sigmoid colectomy  . Gout   . Hernia, umbilical 0786   Laparoscopic umbilical hernia repair with mesh- s/p repair-resolved.  Marland Kitchen History of colon polyps   . Insomnia   . Obesity   . OSA (obstructive sleep apnea) dx 05-2013   Rx a Cpap 3-15, can't use - no longer has machine.    Past Surgical History:  Procedure Laterality Date  . INSERTION OF MESH N/A 02/01/2015   Procedure: INSERTION OF MESH;  Surgeon: Rolm Bookbinder, MD;  Location: [place];  Service: General;  Laterality: N/A;  . LAPAROSCOPIC SIGMOID COLECTOMY N/A 05/24/2016   Procedure: LAPAROSCOPIC SIGMOID COLECTOMY;  Surgeon: Excell Seltzer, MD;  [facility];  Service: General;  Laterality: N/A;  . LASIK Bilateral   . UMBILICAL HERNIA REPAIR N/A 02/01/2015   Procedure: LAPAROSCOPIC UMBILICAL HERNIA REPAIR WITH MESH;  Surgeon: Rolm Bookbinder, MD;  Location: [place];  Service: General;  Laterality: N/A;    Allergies as of 10/14/2019   No Known Allergies     Medication List       Accurate as of October 14, 2019 10:46 AM. If you have any questions, ask your nurse or doctor.        aspirin EC 81 MG tablet Take 81 mg by mouth daily.   canagliflozin 300 MG Tabs tablet Commonly known as:  Invokana Take 1 tablet (300 mg total) by mouth daily before breakfast.   cyclobenzaprine 10 MG tablet Commonly known as: FLEXERIL Take 1 tablet (10 mg total) by mouth 2 (two) times daily as needed for muscle spasms.   fenofibrate 160 MG tablet Take 1 tablet (160 mg total) by mouth daily.   glucose blood test strip Commonly known as: OneTouch Verio Check blood sugar once daily   ibuprofen 800 MG tablet Commonly known as: ADVIL Take 1 tablet (800 mg total) by mouth every 8 (eight) hours as needed.   losartan 100 MG tablet Commonly known as: COZAAR Take 1 tablet (100 mg total) by mouth daily.   metFORMIN 850 MG tablet Commonly known as: GLUCOPHAGE Take 1 tablet (850 mg total) by mouth 3 (three) times daily with meals.   multivitamin tablet Take 1 tablet by mouth daily. Vegan Multi-Pak   ONE TOUCH LANCETS Misc Check blood sugar once daily   OneTouch Verio Flex System w/Device Kit Check blood sugar once daily   Ozempic (1 MG/DOSE) 2 MG/1.5ML Sopn Generic drug: Semaglutide (1 MG/DOSE) Inject 1 mg into the skin once a week.   PROBIOTIC PO Take 1 tablet by mouth daily. Taking [2] two OTC Probiotics   psyllium 58.6 % powder Commonly known as: METAMUCIL Take 1 packet  daily by mouth.   Vyvanse 50 MG capsule Generic drug: lisdexamfetamine Take 50 mg by mouth daily.          Objective:   Physical Exam BP 140/84 (BP Location: Right Arm, Patient Position: Sitting, Cuff Size: Normal)   Pulse (!) 106   Temp 97.6 F (36.4 C) (Temporal)   Resp 18   Ht 6' (1.829 m)   Wt (!) 338 lb 6 oz (153.5 kg)   SpO2 98%   BMI 45.89 kg/m   General:   Well developed, NAD, BMI noted.  HEENT:  Normocephalic . Face symmetric, atraumatic Lungs:  CTA B Normal respiratory effort, no intercostal retractions, no accessory muscle use. Heart: RRR,  no murmur.  Abdomen:  Not distended, soft, non-tender. No rebound or rigidity.   Skin: Not pale. Not jaundice Lower extremities: no  pretibial edema bilaterally  Neurologic:  alert & oriented X3.  Speech normal, gait appropriate for age and unassisted Psych--  Cognition and judgment appear intact.  Cooperative with normal attention span and concentration.  Behavior appropriate. No anxious or depressed appearing.     Assessment    Assessment DM-- dx 2014, took invokana, metformin, dc ~06-2014 as diet improved, a1c 13 ( 08-2015), meds restarted HTN Dyslipidemia Gout Morbid obesity OSA- intolerant to cpap ADHD- per other provider  Insomnia H/o diverticulitis, sigmoid,w/ perf, conservative Rx 10-2014 , Admitted again 08/2015, abscess, s/p drainage. Tobacco- wellbutrin intolerant   PLAN: DM: Reports consistent compliance with Invokana, losartan, Metformin, Ozempic.  Ambulatory CBGs range from 75-250, he is unsure why the fluctuations.  Will check A1c. HTN: Ambulatory BPs are excellent in the 120, 135 range.  This morning at his psychiatrist it was 120/82.  Continue losartan, check a CMP and CBC Dyslipidemia: On fenofibrate, check a FLP OSA: Recently saw Dr. Annamaria Boots, to have a sleep study soon. Left shoulder pain: We really did not talk much about it, he said he is not too bad and declined a referral to sports medicine.  Preventive care: Had 2 Covid vaccinations RTC 3 to 4 months CPX   This visit occurred during the SARS-CoV-2 public health emergency.  Safety protocols were in place, including screening questions prior to the visit, additional usage of staff PPE, and extensive cleaning of exam room while observing appropriate contact time as indicated for disinfecting solutions.

## 2019-10-15 NOTE — Assessment & Plan Note (Signed)
DM: Reports consistent compliance with Invokana, losartan, Metformin, Ozempic.  Ambulatory CBGs range from 75-250, he is unsure why the fluctuations.  Will check A1c. HTN: Ambulatory BPs are excellent in the 120, 135 range.  This morning at his psychiatrist it was 120/82.  Continue losartan, check a CMP and CBC Dyslipidemia: On fenofibrate, check a FLP OSA: Recently saw Dr. Annamaria Boots, to have a sleep study soon. Left shoulder pain: We really did not talk much about it, he said he is not too bad and declined a referral to sports medicine.  Preventive care: Had 2 Covid vaccinations RTC 3 to 4 months CPX

## 2019-10-16 ENCOUNTER — Other Ambulatory Visit (HOSPITAL_COMMUNITY): Payer: BC Managed Care – PPO

## 2019-10-18 ENCOUNTER — Ambulatory Visit: Payer: BC Managed Care – PPO

## 2019-10-18 ENCOUNTER — Other Ambulatory Visit: Payer: Self-pay

## 2019-10-18 DIAGNOSIS — G4733 Obstructive sleep apnea (adult) (pediatric): Secondary | ICD-10-CM

## 2019-10-18 DIAGNOSIS — G4752 REM sleep behavior disorder: Secondary | ICD-10-CM

## 2019-10-19 ENCOUNTER — Encounter (HOSPITAL_BASED_OUTPATIENT_CLINIC_OR_DEPARTMENT_OTHER): Payer: BC Managed Care – PPO | Admitting: Internal Medicine

## 2019-10-19 MED ORDER — CANAGLIFLOZIN 300 MG PO TABS: 300.0000 mg | ORAL_TABLET | Freq: Every day | ORAL | 3 refills | Status: DC

## 2019-10-19 MED ORDER — METFORMIN HCL 850 MG PO TABS: 850.0000 mg | ORAL_TABLET | Freq: Three times a day (TID) | ORAL | 3 refills | Status: DC

## 2019-10-19 MED ORDER — ATORVASTATIN CALCIUM 20 MG PO TABS: 20.0000 mg | ORAL_TABLET | Freq: Every day | ORAL | 3 refills | Status: DC

## 2019-10-19 MED ORDER — FENOFIBRATE 160 MG PO TABS: 160.0000 mg | ORAL_TABLET | Freq: Every day | ORAL | 3 refills | Status: DC

## 2019-10-19 MED ORDER — LOSARTAN POTASSIUM 100 MG PO TABS: 100.0000 mg | ORAL_TABLET | Freq: Every day | ORAL | 3 refills | Status: DC

## 2019-10-19 NOTE — Addendum Note (Signed)
Addended byDamita Dunnings D on: 10/19/2019 03:55 PM   Modules accepted: Orders

## 2019-10-25 DIAGNOSIS — G4733 Obstructive sleep apnea (adult) (pediatric): Secondary | ICD-10-CM | POA: Diagnosis not present

## 2019-11-11 NOTE — Telephone Encounter (Signed)
[name], please advise on sleep study results, thanks

## 2019-11-14 NOTE — Telephone Encounter (Signed)
The home sleep test shows moderate obstructive sleep apnea, averaging about 24 apneas/ hour with drops in blood oxygen level. This is in the same range as his original test. Since it has been about 7 years since he was last fitted, I suggest we try again with CPAP. This will help [place] build documentation for insurance in case we need to try something else.  Please order new DME, new CPAP auto 5-20, mask of choice, humidifier, supplies. AirView/ card. His current appointment with me is too soon for insurance. Please change that to see Me again in 31-90 days per insurance regs. Thanks

## 2019-11-16 ENCOUNTER — Other Ambulatory Visit: Payer: Self-pay | Admitting: Internal Medicine

## 2019-11-16 DIAGNOSIS — G4733 Obstructive sleep apnea (adult) (pediatric): Secondary | ICD-10-CM

## 2019-11-16 NOTE — Progress Notes (Signed)
New orders for CPAP 5-20 cm H2O placed per Dr. Janee Morn order.

## 2019-12-01 ENCOUNTER — Other Ambulatory Visit: Payer: Self-pay

## 2019-12-01 ENCOUNTER — Other Ambulatory Visit (INDEPENDENT_AMBULATORY_CARE_PROVIDER_SITE_OTHER): Payer: BC Managed Care – PPO

## 2019-12-01 DIAGNOSIS — E785 Hyperlipidemia, unspecified: Secondary | ICD-10-CM | POA: Diagnosis not present

## 2019-12-01 LAB — LIPID PANEL
Cholesterol: 144 mg/dL (ref 0–200)
HDL: 25.6 mg/dL — ABNORMAL LOW (ref >39.00)
NonHDL: 118.43
Total CHOL/HDL Ratio: 6
Triglycerides: 344 mg/dL — ABNORMAL HIGH (ref 0.0–149.0)
VLDL: 68.8 mg/dL — ABNORMAL HIGH (ref 0.0–40.0)

## 2019-12-01 LAB — LDL CHOLESTEROL, DIRECT: Direct LDL: 86 mg/dL

## 2019-12-01 LAB — AST: AST: 16 U/L (ref 0–37)

## 2019-12-01 LAB — ALT: ALT: 30 U/L (ref 0–53)

## 2019-12-06 MED ORDER — ATORVASTATIN CALCIUM 20 MG PO TABS: 20.0000 mg | ORAL_TABLET | Freq: Every day | ORAL | 3 refills | Status: DC

## 2019-12-06 NOTE — Addendum Note (Signed)
Addended byDamita Dunnings D on: 12/06/2019 07:35 AM   Modules accepted: Orders

## 2019-12-08 ENCOUNTER — Ambulatory Visit: Payer: BC Managed Care – PPO | Admitting: Internal Medicine

## 2019-12-13 DIAGNOSIS — G4733 Obstructive sleep apnea (adult) (pediatric): Secondary | ICD-10-CM | POA: Diagnosis not present

## 2019-12-23 ENCOUNTER — Encounter: Payer: Self-pay | Admitting: Internal Medicine

## 2020-01-04 ENCOUNTER — Telehealth: Payer: Self-pay

## 2020-01-04 NOTE — Telephone Encounter (Signed)
PA initiated via Covermymeds; KEY: [id number]. Awaiting determination.

## 2020-01-06 MED ORDER — EMPAGLIFLOZIN 25 MG PO TABS: 25.0000 mg | ORAL_TABLET | Freq: Every day | ORAL | 1 refills | Status: DC

## 2020-01-06 NOTE — Telephone Encounter (Signed)
Mychart message sent to Pt. Jardiance 25mg  sent to pharmacy.

## 2020-01-06 NOTE — Telephone Encounter (Signed)
PA denied. Preferred alternatives: Lovie Macadamia

## 2020-01-06 NOTE — Telephone Encounter (Signed)
Switch to Jardiance 25 mg 1 tablet daily.  Advised patient to monitor blood sugars.  Call if he has problems with the new medicine

## 2020-01-11 ENCOUNTER — Encounter: Payer: Self-pay | Admitting: Internal Medicine

## 2020-01-11 ENCOUNTER — Encounter: Payer: BC Managed Care – PPO | Admitting: Internal Medicine

## 2020-01-12 DIAGNOSIS — Z79899 Other long term (current) drug therapy: Secondary | ICD-10-CM | POA: Diagnosis not present

## 2020-01-12 DIAGNOSIS — F902 Attention-deficit hyperactivity disorder, combined type: Secondary | ICD-10-CM | POA: Diagnosis not present

## 2020-01-12 DIAGNOSIS — G4733 Obstructive sleep apnea (adult) (pediatric): Secondary | ICD-10-CM | POA: Diagnosis not present

## 2020-01-17 ENCOUNTER — Other Ambulatory Visit: Payer: Self-pay

## 2020-01-17 ENCOUNTER — Encounter: Payer: Self-pay | Admitting: Internal Medicine

## 2020-01-17 ENCOUNTER — Ambulatory Visit (INDEPENDENT_AMBULATORY_CARE_PROVIDER_SITE_OTHER): Payer: BC Managed Care – PPO | Admitting: Internal Medicine

## 2020-01-17 VITALS — BP 142/96 | HR 101 | Temp 97.8°F | Resp 18 | Ht 72.0 in | Wt 342.1 lb

## 2020-01-17 DIAGNOSIS — M25512 Pain in left shoulder: Secondary | ICD-10-CM

## 2020-01-17 DIAGNOSIS — Z1159 Encounter for screening for other viral diseases: Secondary | ICD-10-CM | POA: Diagnosis not present

## 2020-01-17 DIAGNOSIS — Z1211 Encounter for screening for malignant neoplasm of colon: Secondary | ICD-10-CM

## 2020-01-17 DIAGNOSIS — M109 Gout, unspecified: Secondary | ICD-10-CM

## 2020-01-17 DIAGNOSIS — E1165 Type 2 diabetes mellitus with hyperglycemia: Secondary | ICD-10-CM

## 2020-01-17 DIAGNOSIS — Z0000 Encounter for general adult medical examination without abnormal findings: Secondary | ICD-10-CM

## 2020-01-17 LAB — BASIC METABOLIC PANEL
BUN: 19 mg/dL (ref 6–23)
CO2: 25 mEq/L (ref 19–32)
Calcium: 10.3 mg/dL (ref 8.4–10.5)
Chloride: 102 mEq/L (ref 96–112)
Creatinine, Ser: 1.05 mg/dL (ref 0.40–1.50)
GFR: 77.48 mL/min (ref >60.00)
Glucose, Bld: 118 mg/dL — ABNORMAL HIGH (ref 70–99)
Potassium: 4.3 mEq/L (ref 3.5–5.1)
Sodium: 136 mEq/L (ref 135–145)

## 2020-01-17 LAB — HEMOGLOBIN A1C: Hgb A1c MFr Bld: 7.5 % — ABNORMAL HIGH (ref 4.6–6.5)

## 2020-01-17 LAB — URIC ACID: Uric Acid, Serum: 6.8 mg/dL (ref 4.0–7.8)

## 2020-01-17 MED ORDER — BLOOD GLUCOSE METER KIT: PACK | 0 refills | Status: DC

## 2020-01-17 NOTE — Patient Instructions (Addendum)
Per our records you are due for an eye exam. Please contact your eye doctor to schedule an appointment. Please have them send copies of your office visit notes to [place]. Our fax number is [phone].  Check the  blood pressure weekly BP GOAL is between 110/65 and  135/85. If it is consistently higher or lower, let me know    GO TO THE LAB : Get the blood work     , [place] back for for a checkup in 4 months

## 2020-01-17 NOTE — Progress Notes (Signed)
Pre visit review using our clinic review tool, if applicable. No additional management support is needed unless otherwise documented below in the visit note. 

## 2020-01-17 NOTE — Progress Notes (Signed)
Subjective:    Patient ID: Spencer Good, male    DOB: Mar 18, 1976, 44 y.o.   MRN: [id number]  DOS:  01/17/2020 Type of visit - description: cpx  Since the last office visit, I noted weight gain, reports there is a lot of room for improvement on his diet.  Wt Readings from Last 3 Encounters:  01/17/20 (!) 342 lb 2 oz (155.2 kg)  10/14/19 (!) 338 lb 6 oz (153.5 kg)  10/04/19 (!) 342 lb (155.1 kg)    Review of Systems  Other than above, a 14 point review of systems is negative      Past Medical History:  Diagnosis Date  . ADHD (attention deficit hyperactivity disorder)   . Diabetes ([place])   . Diverticulitis of colon with perforation    s/p sigmoid colectomy  . Gout   . Hernia, umbilical 1448   Laparoscopic umbilical hernia repair with mesh- s/p repair-resolved.  Marland Kitchen History of colon polyps   . Insomnia   . Obesity   . OSA (obstructive sleep apnea) dx 05-2013   Rx a Cpap 3-15, can't use - no longer has machine.    Past Surgical History:  Procedure Laterality Date  . INSERTION OF MESH N/A 02/01/2015   Procedure: INSERTION OF MESH;  Surgeon: Rolm Bookbinder, MD;  Location: [facility];  Service: General;  Laterality: N/A;  . LAPAROSCOPIC SIGMOID COLECTOMY N/A 05/24/2016   Procedure: LAPAROSCOPIC SIGMOID COLECTOMY;  Surgeon: Excell Seltzer, MD;  [facility];  Service: General;  Laterality: N/A;  . LASIK Bilateral   . UMBILICAL HERNIA REPAIR N/A 02/01/2015   Procedure: LAPAROSCOPIC UMBILICAL HERNIA REPAIR WITH MESH;  Surgeon: Rolm Bookbinder, MD;  Location: [facility] [place];  Service: General;  Laterality: N/A;    Allergies as of 01/17/2020   No Known Allergies     Medication List       Accurate as of January 17, 2020 11:59 PM. If you have any questions, ask your nurse or doctor.        aspirin EC 81 MG tablet Take 81 mg by mouth daily.   atorvastatin 20 MG tablet Commonly known as: LIPITOR Take 1 tablet (20 mg total) by mouth at bedtime.   blood glucose meter kit and  supplies Dispense based on patient and insurance preference. Check blood sugars three times daily Started by: Kathlene November, MD   empagliflozin 25 MG Tabs tablet Commonly known as: Jardiance Take 1 tablet (25 mg total) by mouth daily before breakfast.   fenofibrate 160 MG tablet Take 1 tablet (160 mg total) by mouth daily.   glucose blood test strip Commonly known as: OneTouch Verio Check blood sugar once daily   losartan 100 MG tablet Commonly known as: COZAAR Take 1 tablet (100 mg total) by mouth daily.   metFORMIN 850 MG tablet Commonly known as: GLUCOPHAGE Take 1 tablet (850 mg total) by mouth 3 (three) times daily with meals.   multivitamin tablet Take 1 tablet by mouth daily. Vegan Multi-Pak   ONE TOUCH LANCETS Misc Check blood sugar once daily   OneTouch Verio Flex System w/Device Kit Check blood sugar once daily   Ozempic (1 MG/DOSE) 2 MG/1.5ML Sopn Generic drug: Semaglutide (1 MG/DOSE) Inject 1 mg into the skin once a week.   PROBIOTIC PO Take 1 tablet by mouth daily. Taking [2] two OTC Probiotics   psyllium 58.6 % powder Commonly known as: METAMUCIL Take 1 packet daily by mouth.   Vyvanse 50 MG capsule Generic drug: lisdexamfetamine Take  50 mg by mouth daily.          Objective:   Physical Exam BP (!) 142/96 (BP Location: Left Arm, Patient Position: Sitting, Cuff Size: Normal)   Pulse 101   Temp 97.8 F (36.6 C) (Oral)   Resp 18   Ht 6' (1.829 m)   Wt (!) 342 lb 2 oz (155.2 kg)   SpO2 99%   BMI 46.40 kg/m  General: Well developed, NAD, BMI noted Neck: No  thyromegaly  HEENT:  Normocephalic . Face symmetric, atraumatic Lungs:  CTA B Normal respiratory effort, no intercostal retractions, no accessory muscle use. Heart: RRR,  no murmur.  Abdomen:  Not distended, soft, non-tender. No rebound or rigidity.   Lower extremities: no pretibial edema bilaterally  Skin: Exposed areas without rash. Not pale. Not jaundice Neurologic:  alert &  oriented X3.  Speech normal, gait appropriate for age and unassisted Strength symmetric and appropriate for age.  Psych: Cognition and judgment appear intact.  Cooperative with normal attention span and concentration.  Behavior appropriate. No anxious or depressed appearing.     Assessment      Assessment DM-- dx 2014, took invokana, metformin, dc ~06-2014 as diet improved, a1c 13 ( 08-2015), meds restarted HTN Dyslipidemia Gout Morbid obesity OSA ADHD- per other provider  Insomnia H/o diverticulitis, sigmoid,w/ perf, conservative Rx 10-2014 , Admitted again 08/2015, abscess, s/p drainage. Tobacco- wellbutrin intolerant   PLAN: Here for CPX DM: Good compliance with Jardiance, Metformin and, Ozempic 1 mg weekly.  Check A1c. Morbid obesity: Not losing weight at this point despite Jardiance, Ozempic, admits to a poor lifestyle.  Patient is counseled, rec to reach out to the wellness clinic OSA: Struggling with  CPAP compliance, benefits of a CPAP discussed High cholesterol: Lipitor added April 2021, subsequent  LDL decreased to 86. HTN: BP today 142/96, not long ago at the psychiatrist office 125/83.  Ambulatory BPs usually less than 140/80.  Continue losartan. Gout: Asymptomatic, request uric acid checked.  Will do. Left shoulder pain: Ongoing problem, refer to sports medicine RTC 4 months   This visit occurred during the SARS-CoV-2 public health emergency.  Safety protocols were in place, including screening questions prior to the visit, additional usage of staff PPE, and extensive cleaning of exam room while observing appropriate contact time as indicated for disinfecting solutions.

## 2020-01-18 ENCOUNTER — Encounter: Payer: Self-pay | Admitting: Internal Medicine

## 2020-01-18 LAB — HEPATITIS C ANTIBODY
Hepatitis C Ab: NONREACTIVE
SIGNAL TO CUT-OFF: 0.01 (ref <1.00)

## 2020-01-18 NOTE — Assessment & Plan Note (Addendum)
Here for CPX DM: Good compliance with Jardiance, Metformin and, Ozempic 1 mg weekly.  Check A1c. Morbid obesity: Not losing weight at this point despite Jardiance, Ozempic, admits to a poor lifestyle.  Patient is counseled, rec to reach out to the wellness clinic OSA: Struggling with  CPAP compliance, benefits of a CPAP discussed High cholesterol: Lipitor added April 2021, subsequent  LDL decreased to 86. HTN: BP today 142/96, not long ago at the psychiatrist office 125/83.  Ambulatory BPs usually less than 140/80.  Continue losartan. Gout: Asymptomatic, request uric acid checked.  Will do. Left shoulder pain: Ongoing problem, refer to sports medicine RTC 4 months

## 2020-01-18 NOTE — Assessment & Plan Note (Signed)
-  Td 2012 -PNM 23: 2016 - PNM 13 2017 - had covid shots - rec flu shot  - cscope 12/2014, polyp, 5 years, Dr Henrene Pastor.  GI referral -Labs: BMP, A1c, hep C, uric acid -Diet and exercise: discussed

## 2020-01-20 ENCOUNTER — Encounter: Payer: Self-pay | Admitting: Internal Medicine

## 2020-01-24 ENCOUNTER — Other Ambulatory Visit: Payer: Self-pay

## 2020-01-24 ENCOUNTER — Encounter: Payer: Self-pay | Admitting: Family Medicine

## 2020-01-24 ENCOUNTER — Ambulatory Visit: Payer: Self-pay

## 2020-01-24 ENCOUNTER — Ambulatory Visit: Payer: BC Managed Care – PPO | Admitting: Family Medicine

## 2020-01-24 ENCOUNTER — Ambulatory Visit (INDEPENDENT_AMBULATORY_CARE_PROVIDER_SITE_OTHER): Payer: BC Managed Care – PPO

## 2020-01-24 VITALS — BP 122/86 | HR 107 | Ht 72.0 in | Wt 338.0 lb

## 2020-01-24 DIAGNOSIS — M25512 Pain in left shoulder: Secondary | ICD-10-CM

## 2020-01-24 DIAGNOSIS — G8929 Other chronic pain: Secondary | ICD-10-CM | POA: Diagnosis not present

## 2020-01-24 NOTE — Patient Instructions (Signed)
Thank you for coming in today. Plan for PT.  Let me know if you do not get a call about scheduling.  If there is a problem with this PT we have other options.   Call or go to the ER if you develop a large red swollen joint with extreme pain or oozing puss.   Return sooner if needed.    Shoulder Impingement Syndrome  Shoulder impingement syndrome is a condition that causes pain when connective tissues (tendons) surrounding the shoulder joint become pinched. These tendons are part of the group of muscles and tissues that help to stabilize the shoulder (rotator cuff). Beneath the rotator cuff is a fluid-filled sac (bursa) that allows the muscles and tendons to glide smoothly. The bursa may become swollen or irritated (bursitis). Bursitis, swelling in the rotator cuff tendons, or both conditions can decrease how much space is under a bone in the shoulder joint (acromion), resulting in impingement. What are the causes? Shoulder impingement syndrome may be caused by bursitis or swelling of the rotator cuff tendons, which may result from:  Repetitive overhead arm movements.  Falling onto the shoulder.  Weakness in the shoulder muscles. What increases the risk? You may be more likely to develop this condition if you:  Play sports that involve throwing, such as baseball.  Participate in sports such as tennis, volleyball, and swimming.  Work as a Curator, Games developer, or Architect. Some people are also more likely to develop impingement syndrome because of the shape of their acromion bone. What are the signs or symptoms? The main symptom of this condition is pain on the front or side of the shoulder. The pain may:  Get worse when lifting or raising the arm.  Get worse at night.  Wake you up from sleeping.  Feel sharp when the shoulder is moved and then fade to an ache. Other symptoms may include:  Tenderness.  Stiffness.  Inability to raise the arm above shoulder level or behind  the body.  Weakness. How is this diagnosed? This condition may be diagnosed based on:  Your symptoms and medical history.  A physical exam.  Imaging tests, such as: ? X-rays. ? MRI. ? Ultrasound. How is this treated? This condition may be treated by:  Resting your shoulder and avoiding all activities that cause pain or put stress on the shoulder.  Icing your shoulder.  NSAIDs to help reduce pain and swelling.  One or more injections of medicines to numb the area and reduce inflammation.  Physical therapy.  Surgery. This may be needed if nonsurgical treatments have not helped. Surgery may involve repairing the rotator cuff, reshaping the acromion, or removing the bursa. Follow these instructions at home: Managing pain, stiffness, and swelling   If directed, put ice on the injured area. ? Put ice in a plastic bag. ? Place a towel between your skin and the bag. ? Leave the ice on for 20 minutes, 2-3 times a day. Activity  Rest and return to your normal activities as told by your health care provider. Ask your health care provider what activities are safe for you.  Do exercises as told by your health care provider. General instructions  Do not use any products that contain nicotine or tobacco, such as cigarettes, e-cigarettes, and chewing tobacco. These can delay healing. If you need help quitting, ask your health care provider.  Ask your health care provider when it is safe for you to drive.  Take over-the-counter and prescription medicines only as  told by your health care provider.  Keep all follow-up visits as told by your health care provider. This is important. How is this prevented?  Give your body time to rest between periods of activity.  Be safe and responsible while being active. This will help you avoid falls.  Maintain physical fitness, including strength and flexibility. Contact a health care provider if:  Your symptoms have not improved after 1-2  months of treatment and rest.  You cannot lift your arm away from your body. Summary  Shoulder impingement syndrome is a condition that causes pain when connective tissues (tendons) surrounding the shoulder joint become pinched.  The main symptom of this condition is pain on the front or side of the shoulder.  This condition is usually treated with rest, ice, and pain medicines as needed. This information is not intended to replace advice given to you by your health care provider. Make sure you discuss any questions you have with your health care provider. Document Revised: 09/25/2018 Document Reviewed: 11/26/2017 Elsevier Patient Education  2020 [employer].

## 2020-01-24 NOTE — Progress Notes (Signed)
Subjective:    I'm seeing this patient as a consultation for:  Dr. Larose Kells. Note will be routed back to referring provider/PCP.  CC: L shoulder pain  I, Molly Weber, LAT, ATC, am serving as scribe for Dr. Lynne Leader.  HPI: Pt is a 44 y/o male presenting w/ c/o L shoulder pain x one year.  He locates his pain to his L ant shoulder.  He cannot recall any injury.    Radiating pain: yes  L shoulder mechanical symptoms: No Aggravating factors: horiz aBd and aDd; quick arm movement; L arm end-range overhead ROM Treatments tried: aspirin; Aleve  Past medical history, Surgical history, Family history, Social history, Allergies, and medications have been entered into the medical record, reviewed.   Review of Systems: No new headache, visual changes, nausea, vomiting, diarrhea, constipation, dizziness, abdominal pain, skin rash, fevers, chills, night sweats, weight loss, swollen lymph nodes, body aches, joint swelling, muscle aches, chest pain, shortness of breath, mood changes, visual or auditory hallucinations.   Objective:    Vitals:   01/24/20 1519  BP: 122/86  Pulse: (!) 107  SpO2: 96%   General: Well Developed, well nourished, and in no acute distress.  Neuro/Psych: Alert and oriented x3, extra-ocular muscles intact, able to move all 4 extremities, sensation grossly intact. Skin: Warm and dry, no rashes noted.  Respiratory: Not using accessory muscles, speaking in full sentences, trachea midline.  Cardiovascular: Pulses palpable, no extremity edema. Abdomen: Does not appear distended. MSK: C-spine normal-appearing nontender normal motion. Left shoulder normal-appearing Not particular tender palpation. Range of motion: Full abduction. External rotation 20 degrees by neutral position.  Internal rotation to iliac crest. Strength intact. Positive Hawkins and Neer's test positive empty can test  negative Yergason's and speeds test.  Contralateral left shoulder normal-appearing  normal motion normal strength negative impingement testing.   Lab and Radiology Results  X-ray images left shoulder obtained today personally and independently reviewed No acute fractures.  Mild AC DJD. Await formal radiology review  Diagnostic Limited MSK Ultrasound of: Left shoulder Biceps tendon intact.  Hypoechoic fluid tracking within tendon sheath. Subscapularis tendon intact Supraspinatus tendon intact. Increased subacromial bursa thickness present. Infraspinatus tendon is intact. AC joint narrowed degenerative Impression: Biceps tenosynovitis and subacromial bursitis  Procedure: Real-time Ultrasound Guided Injection of left shoulder subacromial bursa Device: Philips Affiniti 50G Images permanently stored and available for review in the ultrasound unit. Verbal informed consent obtained.  Discussed risks and benefits of procedure. Warned about infection bleeding damage to structures skin hypopigmentation and fat atrophy among others. Patient expresses understanding and agreement Time-out conducted.   Noted no overlying erythema, induration, or other signs of local infection.   Skin prepped in a sterile fashion.   Local anesthesia: Topical Ethyl chloride.   With sterile technique and under real time ultrasound guidance:  40 mg of Kenalog and 2 mL of Marcaine injected easily.   Completed without difficulty   Pain partially resolved suggesting accurate placement of the medication.   Advised to call if fevers/chills, erythema, induration, drainage, or persistent bleeding.   Images permanently stored and available for review in the ultrasound unit.  Impression: Technically successful ultrasound guided injection.     Impression and Recommendations:    Assessment and Plan: 44 y.o. male with left shoulder pain due to subacromial bursitis.  Patient may have a component of intra-articular pain causing fluid tracking in tendon sheath or could have true biceps tenosynovitis.  Plan  to treat with physical therapy and injection as  above.  Recheck back in about 6 weeks.  Return sooner if needed.Marland Kitchen  PDMP not reviewed this encounter. Orders Placed This Encounter  Procedures  . [place] LIMITED JOINT SPACE STRUCTURES UP LEFT(NO LINKED CHARGES)    Order Specific Question:   Reason for Exam (SYMPTOM  OR DIAGNOSIS REQUIRED)    Answer:   L shoulder pain    Order Specific Question:   Preferred imaging location?    Answer:   [facility]  . DG Shoulder Left    Standing Status:   Future    Number of Occurrences:   1    Standing Expiration Date:   01/23/2021    Order Specific Question:   Reason for Exam (SYMPTOM  OR DIAGNOSIS REQUIRED)    Answer:   eval left shoulder pain    Order Specific Question:   Preferred imaging location?    Answer:   [facility]    Order Specific Question:   Radiology Contrast Protocol - do NOT remove file path    Answer:   [email or web address]  . Ambulatory referral to Physical Therapy    Referral Priority:   Routine    Referral Type:   Physical Medicine    Referral Reason:   Specialty Services Required    Requested Specialty:   Physical Therapy   No orders of the defined types were placed in this encounter.   Discussed warning signs or symptoms. Please see discharge instructions. Patient expresses understanding.   The above documentation has been reviewed and is accurate and complete Lynne Leader, M.D.

## 2020-01-25 NOTE — Progress Notes (Signed)
Left shoulder normal-appearing to radiology.

## 2020-01-31 ENCOUNTER — Encounter: Payer: Self-pay | Admitting: Rehabilitative and Restorative Service Providers"

## 2020-01-31 ENCOUNTER — Other Ambulatory Visit: Payer: Self-pay

## 2020-01-31 ENCOUNTER — Ambulatory Visit (INDEPENDENT_AMBULATORY_CARE_PROVIDER_SITE_OTHER): Payer: BC Managed Care – PPO | Admitting: Rehabilitative and Restorative Service Providers"

## 2020-01-31 DIAGNOSIS — G8929 Other chronic pain: Secondary | ICD-10-CM | POA: Diagnosis not present

## 2020-01-31 DIAGNOSIS — M25512 Pain in left shoulder: Secondary | ICD-10-CM | POA: Diagnosis not present

## 2020-01-31 DIAGNOSIS — M25612 Stiffness of left shoulder, not elsewhere classified: Secondary | ICD-10-CM | POA: Diagnosis not present

## 2020-01-31 DIAGNOSIS — M6281 Muscle weakness (generalized): Secondary | ICD-10-CM

## 2020-01-31 NOTE — Patient Instructions (Signed)
Access Code: [email or web address] URL: [email or web address] Date: 01/31/2020 Prepared by: Scot Jun  Exercises Supine Shoulder Flexion Extension AAROM with Dowel - 2 x daily - 7 x weekly - 2 sets - 10 reps - 5 hold Supine Shoulder Abduction AAROM with Dowel - 2 x daily - 7 x weekly - 2 sets - 10 reps - 5 hold Supine Shoulder External Rotation in 45 Degrees Abduction AAROM with Dowel - 2 x daily - 7 x weekly - 2 sets - 10 reps - 5 hold Standing Shoulder Row with Anchored Resistance - 2 x daily - 7 x weekly - 10 reps - 3 sets Shoulder Extension with Resistance - 2 x daily - 7 x weekly - 10 reps - 3 sets

## 2020-01-31 NOTE — Therapy (Addendum)
[facility] [place], [place], [address] Phone: [phone]   Fax:  [phone]  Physical Therapy Evaluation  Patient Details  Name: Spencer Good MRN: [id number] Date of Birth: [birth year] Referring Provider (PT): Dr. Lynne Leader   Encounter Date: 01/31/2020   PT End of Session - 01/31/20 0806    Visit Number 1    Number of Visits 12    Date for PT Re-Evaluation 03/27/20    Progress Note Due on Visit 10    PT Start Time 0806    PT Stop Time 0842    PT Time Calculation (min) 36 min    Activity Tolerance Patient tolerated treatment well    Behavior During Therapy [facility] for tasks assessed/performed           Past Medical History:  Diagnosis Date  . ADHD (attention deficit hyperactivity disorder)   . Diabetes ([place])   . Diverticulitis of colon with perforation    s/p sigmoid colectomy  . Gout   . Hernia, umbilical 4010   Laparoscopic umbilical hernia repair with mesh- s/p repair-resolved.  Marland Kitchen History of colon polyps   . Insomnia   . Obesity   . OSA (obstructive sleep apnea) dx 05-2013   Rx a Cpap 3-15, can't use - no longer has machine.    Past Surgical History:  Procedure Laterality Date  . INSERTION OF MESH N/A 02/01/2015   Procedure: INSERTION OF MESH;  Surgeon: Rolm Bookbinder, MD;  Location: [facility];  Service: General;  Laterality: N/A;  . LAPAROSCOPIC SIGMOID COLECTOMY N/A 05/24/2016   Procedure: LAPAROSCOPIC SIGMOID COLECTOMY;  Surgeon: Excell Seltzer, MD;  Location: [facility];  Service: General;  Laterality: N/A;  . LASIK Bilateral   . UMBILICAL HERNIA REPAIR N/A 02/01/2015   Procedure: LAPAROSCOPIC UMBILICAL HERNIA REPAIR WITH MESH;  Surgeon: Rolm Bookbinder, MD;  Location: [facility];  Service: General;  Laterality: N/A;    There were no vitals filed for this visit.    Subjective Assessment - 01/31/20 0809    Subjective Pt. indicated insidious onset of symptoms about a year ago.  Pt. stated quick movements can  hurt.  Pt. stated feeling like bone hits something causing pain symptoms.  Soreness around the shoulder.    Pertinent History DM, ADHS, obesity    Limitations Lifting    Diagnostic tests Xrays negative    Patient Stated Goals Reduce pain    Multiple Pain Sites Yes    Pain Score 9   at worst 9/10   Pain Location Shoulder    Pain Orientation Left    Pain Descriptors / Indicators Tightness;Sore;Sharp    Pain Type Chronic pain    Pain Onset More than a month ago    Pain Frequency Intermittent    Aggravating Factors  quick movements, lifting, push/pulling, sleeping difficulty    Pain Relieving Factors avoiding activity that hurts    Effect of Pain on Daily Activities Walking dogs, household activity, 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 Lynne Leader    Onset Date/Surgical Date 01/31/19    Hand Dominance Right      Precautions   Precautions None      Restrictions   Weight Bearing Restrictions No      Balance Screen   Has the patient  fallen in the past 6 months No    Is the patient reluctant to leave their home because of a fear of falling?  No      Home Ecologist residence      Prior Function   Level of Independence Independent    Vocation Full time employment    Vocation Requirements Deskwork    Leisure Household activity, walking dogs      Cognition   Overall Cognitive Status Within Functional Limits for tasks assessed      Observation/Other Assessments   Observations Localized edema anterior L GH jt line mild to visual inspection      ROM / Strength   AROM / PROM / Strength Strength;PROM;AROM      AROM   Overall AROM Comments pain, muscle guarding in Lt flexion, abd, er , ir end ranges    AROM Assessment Site Shoulder    Right/Left Shoulder Left;Right    Left Shoulder Flexion 135 Degrees    Left Shoulder  ABduction 120 Degrees    Left Shoulder Internal Rotation 65 Degrees    Left Shoulder External Rotation 50 Degrees      PROM   Overall PROM Comments pain, muscle guarding in Lt flexion, abd, er , ir end ranges    PROM Assessment Site Shoulder    Right/Left Shoulder Left;Right    Left Shoulder Flexion 140 Degrees    Left Shoulder Internal Rotation 70 Degrees   supine in 45 deg abd   Left Shoulder External Rotation 55 Degrees   supine in 45 deg abd     Strength   Overall Strength Comments pain c all Lt GH jt MMT testing, elbow MMT bilateral 5/5    Strength Assessment Site Shoulder    Right/Left Shoulder Left;Right    Right Shoulder Flexion 5/5    Right Shoulder ABduction 5/5    Right Shoulder Internal Rotation 5/5    Right Shoulder External Rotation 5/5    Left Shoulder Flexion 4-/5    Left Shoulder ABduction 4-/5    Left Shoulder Internal Rotation 4/5    Left Shoulder External Rotation 4/5      Palpation   Palpation comment Tenderness proximal long head biceps on Lt GH jt anterior, anterior deltoid TrP.  Jt mobility WFL in mid range (muscle guarding at end range abd, Er,IR)      Special Tests   Other special tests (+) Painful arc Lt, (+) Neer's Lt, (-) Drop arm Lt                      Objective measurements completed on examination: See above findings.       [facility] Adult PT Treatment/Exercise - 01/31/20 0001      Exercises   Exercises Shoulder;Other Exercises    Other Exercises  Intervention cues, handout c time spent reviewing      Shoulder Exercises: Supine   Other Supine Exercises supine wand flexion, abd, er to end range, 5 sec hold x 15 each way c cues      Shoulder Exercises: Standing   Other Standing Exercises Green tband rows, gh ext x 15 each c cues      Manual Therapy   Manual therapy comments g2-g3 inferior, ap jt mobs Lt [place], prom                  PT Education - 01/31/20 0812    Education Details HEP, POC  Person(s) Educated  Patient    Methods Explanation;Demonstration;Verbal cues;Handout    Comprehension Returned demonstration;Verbalized understanding            PT Short Term Goals - 01/31/20 0848      PT SHORT TERM GOAL #1   Title Patient will demonstrate independent use of home exercise program to maintain progress from in clinic treatments.    Time 2    Period Weeks    Status New    Target Date 02/14/20             PT Long Term Goals - 01/31/20 0847      PT LONG TERM GOAL #1   Title Patient will demonstrate/report pain at worst less than or equal to 2/10 to facilitate minimal limitation in daily activity secondary to pain symptoms.    Time 8    Period Weeks    Status New    Target Date 03/27/20      PT LONG TERM GOAL #2   Title Patient will demonstrate independent use of home exercise program to facilitate ability to maintain/progress functional gains from skilled physical therapy services.    Time 8    Period Weeks    Status New    Target Date 03/27/20      PT LONG TERM GOAL #3   Title Patient will demonstrate return to work/recreational activity at previous level of function without limitations secondary due to condition    Time 8    Period Weeks    Status New    Target Date 03/27/20      PT LONG TERM GOAL #4   Title Patient will demonstrate Lt Independence joint mobility WFL to facilitate usual self care, dressing, reaching overhead at PLOF s limitation due to symptoms.    Time 8    Period Weeks    Status New    Target Date 03/27/20      PT LONG TERM GOAL #5   Title Patient will demonstrate Lt UE MMT 5/5 throughout to facilitate usual lifting, carrying in functional activity to PLOF s limitation.    Time 8    Period Weeks    Status New    Target Date 03/27/20                  Plan - 01/31/20 4562    Clinical Impression Statement Patient is a  44 y.o. male who comes to clinic with complaints of Lt shoulder pain with mobility, strength and movement coordination deficits  that impair their ability to perform usual daily and recreational functional activities without increase difficulty/symptoms at this time.  Patient to benefit from skilled PT services to address impairments and limitations to improve to previous level of function without restriction secondary to condition.    Personal Factors and Comorbidities Other   DM, ADHS, obesity   Examination-Activity Limitations Sleep;Carry;Dressing;Hygiene/Grooming;Lift;Reach Overhead    Examination-Participation Restrictions Community Activity;Yard Work    Stability/Clinical Decision Making Stable/Uncomplicated    Clinical Decision Making Low    Rehab Potential Good    PT Frequency --   1-2x/week   PT Duration 8 weeks    PT Treatment/Interventions ADLs/Self Care Home Management;Cryotherapy;Electrical Stimulation;Iontophoresis 4mg /ml Dexamethasone;Moist Heat;Traction;Balance training;Therapeutic exercise;Therapeutic activities;Functional mobility training;Ultrasound;Neuromuscular re-education;Patient/family education;Manual techniques;Vasopneumatic Device;Taping;Passive range of motion;Spinal Manipulations;Joint Manipulations;Dry needling    PT Next Visit Plan Progress end range mobility (manual, stretching), early strengthening/stabilization intervention)    PT Home Exercise Plan [email or web address]    Consulted and Agree with Plan of Care Patient  Patient will benefit from skilled therapeutic intervention in order to improve the following deficits and impairments:  Decreased endurance, Hypomobility, Obesity, Pain, Impaired UE functional use, Increased fascial restricitons, Decreased strength, Decreased activity tolerance, Decreased mobility, Increased muscle spasms, Impaired perceived functional ability, Decreased range of motion, Decreased coordination, Impaired flexibility  Visit Diagnosis: Chronic left shoulder pain - Plan: PT plan of care cert/re-cert  Muscle weakness (generalized) - Plan: PT plan of care  cert/re-cert  Stiffness of left shoulder, not elsewhere classified - Plan: PT plan of care cert/re-cert     Problem List Patient Active Problem List   Diagnosis Date Noted  . Obstructive sleep apnea 10/05/2019  . Dyslipidemia 07/09/2019  . Annual physical exam 02/02/2019  . Diverticulitis of sigmoid colon 05/24/2016  . Morbid obesity ([place]) 04/25/2016  . ADD (attention deficit disorder) 12/28/2015  . Diverticulitis of large intestine with abscess without bleeding 09/13/2015  . PCP NOTES >>>>>>>>>>>>>>>>>>>> 09/07/2015  . Elevated WBCs 01/27/2014  . Diabetes ([place]) 02/17/2013  . Tachycardia 08/29/2011  . TOBACCO USER 06/29/2010    Scot Jun, PT, DPT, OCS, ATC 01/31/20  9:23 AM    [facility] Physical Therapy [address] [place], [place], [address] Phone: [phone]   Fax:  [phone]  Name: Spencer Good MRN: [id number] Date of Birth: Nov 28, 1975

## 2020-02-07 ENCOUNTER — Encounter: Payer: Self-pay | Admitting: Physical Therapy

## 2020-02-07 ENCOUNTER — Other Ambulatory Visit: Payer: Self-pay

## 2020-02-07 ENCOUNTER — Ambulatory Visit (INDEPENDENT_AMBULATORY_CARE_PROVIDER_SITE_OTHER): Payer: BC Managed Care – PPO | Admitting: Physical Therapy

## 2020-02-07 DIAGNOSIS — G8929 Other chronic pain: Secondary | ICD-10-CM | POA: Diagnosis not present

## 2020-02-07 DIAGNOSIS — M25512 Pain in left shoulder: Secondary | ICD-10-CM | POA: Diagnosis not present

## 2020-02-07 DIAGNOSIS — M6281 Muscle weakness (generalized): Secondary | ICD-10-CM

## 2020-02-07 DIAGNOSIS — M25612 Stiffness of left shoulder, not elsewhere classified: Secondary | ICD-10-CM | POA: Diagnosis not present

## 2020-02-07 NOTE — Therapy (Signed)
[place], [place], [address] Phone: [phone]   Fax:  [phone]  Physical Therapy Treatment  Patient Details  Name: Spencer Good MRN: [id number] Date of Birth: Feb 27, 1976 Referring Provider (PT): Dr. Lynne Leader   Encounter Date: 02/07/2020   PT End of Session - 02/07/20 0843    Visit Number 2    Number of Visits 12    Date for PT Re-Evaluation 03/27/20    Progress Note Due on Visit 10    PT Start Time 0800    PT Stop Time 0839    PT Time Calculation (min) 39 min    Activity Tolerance Patient tolerated treatment well    Behavior During Therapy [facility] for tasks assessed/performed           Past Medical History:  Diagnosis Date  . ADHD (attention deficit hyperactivity disorder)   . Diabetes ([place])   . Diverticulitis of colon with perforation    s/p sigmoid colectomy  . Gout   . Hernia, umbilical 6122   Laparoscopic umbilical hernia repair with mesh- s/p repair-resolved.  Marland Kitchen History of colon polyps   . Insomnia   . Obesity   . OSA (obstructive sleep apnea) dx 05-2013   Rx a Cpap 3-15, can't use - no longer has machine.    Past Surgical History:  Procedure Laterality Date  . INSERTION OF MESH N/A 02/01/2015   Procedure: INSERTION OF MESH;  Surgeon: Rolm Bookbinder, MD;  Location: [place];  Service: General;  Laterality: N/A;  . LAPAROSCOPIC SIGMOID COLECTOMY N/A 05/24/2016   Procedure: LAPAROSCOPIC SIGMOID COLECTOMY;  Surgeon: Excell Seltzer, MD;  [facility];  Service: General;  Laterality: N/A;  . LASIK Bilateral   . UMBILICAL HERNIA REPAIR N/A 02/01/2015   Procedure: LAPAROSCOPIC UMBILICAL HERNIA REPAIR WITH MESH;  Surgeon: Rolm Bookbinder, MD;  Location: [place];  Service: General;  Laterality: N/A;    There were no vitals filed for this visit.   Subjective Assessment - 02/07/20 0757    Subjective shoulder feels a little worse today, thinks exercises seem to be aggravating it.  also having  sharp pains with sudden movements    Pertinent History DM, ADHS, obesity    Limitations Lifting    Diagnostic tests Xrays negative    Patient Stated Goals Reduce pain    Currently in Pain? Yes    Pain Score 3     Pain Location Shoulder    Pain Orientation Left    Pain Descriptors / Indicators Aching;Sharp;Shooting    Pain Type Acute pain;Surgical pain    Pain Onset More than a month ago    Pain Frequency Intermittent    Aggravating Factors  quick movements, pulling sheet over himself in the bed    Pain Relieving Factors holding arm close    Pain Onset --                             [facility] Adult PT Treatment/Exercise - 02/07/20 0804      Shoulder Exercises: Supine   Other Supine Exercises supine wand flexion, abd, er to end range, 5 sec hold x 20 each way c cues      Shoulder Exercises: Standing   External Rotation Both;20 reps;Theraband    Theraband Level (Shoulder External Rotation) Level 3 (Green)    Extension Both;20 reps;Theraband    Theraband Level (Shoulder Extension) Level 3 (Green)    Row [employer];Theraband  Theraband Level (Shoulder Row) Level 3 (Green)    Other Standing Exercises standing wand flexion, abduction, extension, ir x 20 reps      Shoulder Exercises: Pulleys   Flexion 2 minutes    Scaption 2 minutes      Manual Therapy   Manual therapy comments g2-g3 inferior, ap jt mobs Lt GH jt, prom                    PT Short Term Goals - 01/31/20 0848      PT SHORT TERM GOAL #1   Title Patient will demonstrate independent use of home exercise program to maintain progress from in clinic treatments.    Time 2    Period Weeks    Status New    Target Date 02/14/20             PT Long Term Goals - 01/31/20 0847      PT LONG TERM GOAL #1   Title Patient will demonstrate/report pain at worst less than or equal to 2/10 to facilitate minimal limitation in daily activity secondary to pain symptoms.    Time 8    Period Weeks     Status New    Target Date 03/27/20      PT LONG TERM GOAL #2   Title Patient will demonstrate independent use of home exercise program to facilitate ability to maintain/progress functional gains from skilled physical therapy services.    Time 8    Period Weeks    Status New    Target Date 03/27/20      PT LONG TERM GOAL #3   Title Patient will demonstrate return to work/recreational activity at previous level of function without limitations secondary due to condition    Time 8    Period Weeks    Status New    Target Date 03/27/20      PT LONG TERM GOAL #4   Title Patient will demonstrate Lt [place] joint mobility WFL to facilitate usual self care, dressing, reaching overhead at PLOF s limitation due to symptoms.    Time 8    Period Weeks    Status New    Target Date 03/27/20      PT LONG TERM GOAL #5   Title Patient will demonstrate Lt UE MMT 5/5 throughout to facilitate usual lifting, carrying in functional activity to PLOF s limitation.    Time 8    Period Weeks    Status New    Target Date 03/27/20                 Plan - 02/07/20 0844    Clinical Impression Statement Pt needed min cues with review of initial HEP today but overall demonstrated good understanding of HEP.  Discussed progression to standing AAROM exercises.  No goals met to date, and will continue to benefit from PT to maximize function.    Personal Factors and Comorbidities Other   DM, ADHS, obesity   Examination-Activity Limitations Sleep;Carry;Dressing;Hygiene/Grooming;Lift;Reach Overhead    Examination-Participation Restrictions Community Activity;Yard Work    Stability/Clinical Decision Making Stable/Uncomplicated    Rehab Potential Good    PT Frequency --   1-2x/week   PT Duration 8 weeks    PT Treatment/Interventions ADLs/Self Care Home Management;Cryotherapy;Electrical Stimulation;Iontophoresis 78m/ml Dexamethasone;Moist Heat;Traction;Balance training;Therapeutic exercise;Therapeutic  activities;Functional mobility training;Ultrasound;Neuromuscular re-education;Patient/family education;Manual techniques;Vasopneumatic Device;Taping;Passive range of motion;Spinal Manipulations;Joint Manipulations;Dry needling    PT Next Visit Plan Progress end range mobility (manual, stretching), early strengthening/stabilization intervention), work  on functional internal rotation as able    PT Home Exercise Plan [id number]    Consulted and Agree with Plan of Care Patient           Patient will benefit from skilled therapeutic intervention in order to improve the following deficits and impairments:  Decreased endurance, Hypomobility, Obesity, Pain, Impaired UE functional use, Increased fascial restricitons, Decreased strength, Decreased activity tolerance, Decreased mobility, Increased muscle spasms, Impaired perceived functional ability, Decreased range of motion, Decreased coordination, Impaired flexibility  Visit Diagnosis: Chronic left shoulder pain  Muscle weakness (generalized)  Stiffness of left shoulder, not elsewhere classified     Problem List Patient Active Problem List   Diagnosis Date Noted  . Obstructive sleep apnea 10/05/2019  . Dyslipidemia 07/09/2019  . Annual physical exam 02/02/2019  . Diverticulitis of sigmoid colon 05/24/2016  . Morbid obesity ([place]) 04/25/2016  . ADD (attention deficit disorder) 12/28/2015  . Diverticulitis of large intestine with abscess without bleeding 09/13/2015  . PCP NOTES >>>>>>>>>>>>>>>>>>>> 09/07/2015  . Elevated WBCs 01/27/2014  . Diabetes ([place]) 02/17/2013  . Tachycardia 08/29/2011  . TOBACCO USER 06/29/2010      Laureen Abrahams, PT, DPT 02/07/20 8:46 AM    [facility] Physical Therapy [address] [place], [place], [address] Phone: [phone]   Fax:  [phone]  Name: Spencer Good MRN: [id number] Date of Birth: [birth year]

## 2020-02-11 ENCOUNTER — Encounter: Payer: BC Managed Care – PPO | Admitting: Rehabilitative and Restorative Service Providers"

## 2020-02-12 DIAGNOSIS — G4733 Obstructive sleep apnea (adult) (pediatric): Secondary | ICD-10-CM | POA: Diagnosis not present

## 2020-02-14 ENCOUNTER — Encounter: Payer: Self-pay | Admitting: Physical Therapy

## 2020-02-14 ENCOUNTER — Other Ambulatory Visit: Payer: Self-pay

## 2020-02-14 ENCOUNTER — Ambulatory Visit (INDEPENDENT_AMBULATORY_CARE_PROVIDER_SITE_OTHER): Payer: BC Managed Care – PPO | Admitting: Physical Therapy

## 2020-02-14 DIAGNOSIS — M6281 Muscle weakness (generalized): Secondary | ICD-10-CM

## 2020-02-14 DIAGNOSIS — M25612 Stiffness of left shoulder, not elsewhere classified: Secondary | ICD-10-CM

## 2020-02-14 DIAGNOSIS — G8929 Other chronic pain: Secondary | ICD-10-CM | POA: Diagnosis not present

## 2020-02-14 DIAGNOSIS — M25512 Pain in left shoulder: Secondary | ICD-10-CM | POA: Diagnosis not present

## 2020-02-14 NOTE — Patient Instructions (Signed)

## 2020-02-14 NOTE — Therapy (Signed)
[place], [place], [address] Phone: [phone]   Fax:  [phone]  Physical Therapy Treatment  Patient Details  Name: Spencer Good MRN: [id number] Date of Birth: May 13, 1976 Referring Provider (PT): Dr. Lynne Leader   Encounter Date: 02/14/2020   PT End of Session - 02/14/20 0840    Visit Number 3    Number of Visits 12    Date for PT Re-Evaluation 03/27/20    Progress Note Due on Visit 10    PT Start Time 0800    PT Stop Time 0840    PT Time Calculation (min) 40 min    Activity Tolerance Patient tolerated treatment well    Behavior During Therapy [facility] for tasks assessed/performed           Past Medical History:  Diagnosis Date  . ADHD (attention deficit hyperactivity disorder)   . Diabetes ([place])   . Diverticulitis of colon with perforation    s/p sigmoid colectomy  . Gout   . Hernia, umbilical 5277   Laparoscopic umbilical hernia repair with mesh- s/p repair-resolved.  Marland Kitchen History of colon polyps   . Insomnia   . Obesity   . OSA (obstructive sleep apnea) dx 05-2013   Rx a Cpap 3-15, can't use - no longer has machine.    Past Surgical History:  Procedure Laterality Date  . INSERTION OF MESH N/A 02/01/2015   Procedure: INSERTION OF MESH;  Surgeon: Rolm Bookbinder, MD;  Location: [place];  Service: General;  Laterality: N/A;  . LAPAROSCOPIC SIGMOID COLECTOMY N/A 05/24/2016   Procedure: LAPAROSCOPIC SIGMOID COLECTOMY;  Surgeon: Excell Seltzer, MD;  [facility];  Service: General;  Laterality: N/A;  . LASIK Bilateral   . UMBILICAL HERNIA REPAIR N/A 02/01/2015   Procedure: LAPAROSCOPIC UMBILICAL HERNIA REPAIR WITH MESH;  Surgeon: Rolm Bookbinder, MD;  Location: [place];  Service: General;  Laterality: N/A;    There were no vitals filed for this visit.   Subjective Assessment - 02/14/20 0803    Subjective doing okay, shoulder is sore all the time now with sharp pain with movement still    Pertinent  History DM, ADHS, obesity    Limitations Lifting    Diagnostic tests Xrays negative    Patient Stated Goals Reduce pain    Currently in Pain? Yes    Pain Score 2    up to 7-8/10   Pain Location Shoulder    Pain Orientation Left    Pain Descriptors / Indicators Aching;Sharp;Shooting    Pain Type Acute pain;Surgical pain    Pain Onset More than a month ago    Pain Frequency Intermittent    Aggravating Factors  quick movements, pulling sheet over himself in bed    Pain Relieving Factors holding arm close                             [facility] Adult PT Treatment/Exercise - 02/14/20 0807      Shoulder Exercises: Supine   Protraction Left;20 reps    Protraction Weight (lbs) 2#      Shoulder Exercises: Seated   External Rotation Both;20 reps;Theraband    Theraband Level (Shoulder External Rotation) Level 2 (Red)      Shoulder Exercises: Standing   Horizontal ABduction Both;20 reps;Theraband    Theraband Level (Shoulder Horizontal ABduction) Level 2 (Red)    Other Standing Exercises ball roll forward flexion x 20  Shoulder Exercises: Pulleys   Flexion 2 minutes    Scaption 2 minutes      Shoulder Exercises: ROM/Strengthening   Proximal Shoulder Strengthening, Supine 2# circles x 20 reps; A-Z      Manual Therapy   Manual Therapy Passive ROM;Soft tissue mobilization    Soft tissue mobilization upper trap/supraspinatus    Passive ROM Lt shoulder all motions            Trigger Point Dry Needling - 02/14/20 0837    Consent Given? Yes    Muscles Treated Upper Quadrant Supraspinatus    Supraspinatus Response Twitch response elicited                PT Education - 02/14/20 0840    Education Details DN    Person(s) Educated Patient    Methods Explanation;Handout    Comprehension Verbalized understanding            PT Short Term Goals - 02/14/20 0840      PT SHORT TERM GOAL #1   Title Patient will demonstrate independent use of home exercise  program to maintain progress from in clinic treatments.    Baseline 8/30: exercises currently increasing pain    Time 2    Period Weeks    Status Partially Met    Target Date 02/14/20             PT Long Term Goals - 01/31/20 0847      PT LONG TERM GOAL #1   Title Patient will demonstrate/report pain at worst less than or equal to 2/10 to facilitate minimal limitation in daily activity secondary to pain symptoms.    Time 8    Period Weeks    Status New    Target Date 03/27/20      PT LONG TERM GOAL #2   Title Patient will demonstrate independent use of home exercise program to facilitate ability to maintain/progress functional gains from skilled physical therapy services.    Time 8    Period Weeks    Status New    Target Date 03/27/20      PT LONG TERM GOAL #3   Title Patient will demonstrate return to work/recreational activity at previous level of function without limitations secondary due to condition    Time 8    Period Weeks    Status New    Target Date 03/27/20      PT LONG TERM GOAL #4   Title Patient will demonstrate Lt [place] joint mobility WFL to facilitate usual self care, dressing, reaching overhead at PLOF s limitation due to symptoms.    Time 8    Period Weeks    Status New    Target Date 03/27/20      PT LONG TERM GOAL #5   Title Patient will demonstrate Lt UE MMT 5/5 throughout to facilitate usual lifting, carrying in functional activity to PLOF s limitation.    Time 8    Period Weeks    Status New    Target Date 03/27/20                 Plan - 02/14/20 0841    Clinical Impression Statement Pt with active trigger point in Lt supraspinatus so treated with DN and manual therapy today with immediate positive response to session.  Pain overall decreased with functional movements following.  Will continue to benefit from PT to maximize function.  Partially met STG as HEP has been increasing pain, but pt  knows how to perform exercises.    Personal  Factors and Comorbidities Other   DM, ADHS, obesity   Examination-Activity Limitations Sleep;Carry;Dressing;Hygiene/Grooming;Lift;Reach Overhead    Examination-Participation Restrictions Community Activity;Yard Work    Stability/Clinical Decision Making Stable/Uncomplicated    Rehab Potential Good    PT Frequency --   1-2x/week   PT Duration 8 weeks    PT Treatment/Interventions ADLs/Self Care Home Management;Cryotherapy;Electrical Stimulation;Iontophoresis 36m/ml Dexamethasone;Moist Heat;Traction;Balance training;Therapeutic exercise;Therapeutic activities;Functional mobility training;Ultrasound;Neuromuscular re-education;Patient/family education;Manual techniques;Vasopneumatic Device;Taping;Passive range of motion;Spinal Manipulations;Joint Manipulations;Dry needling    PT Next Visit Plan Progress end range mobility (manual, stretching), early strengthening/stabilization intervention), work on functional internal rotation as able; assess response to DN and consider DN to [place] Agree with Plan of Care Patient           Patient will benefit from skilled therapeutic intervention in order to improve the following deficits and impairments:  Decreased endurance, Hypomobility, Obesity, Pain, Impaired UE functional use, Increased fascial restricitons, Decreased strength, Decreased activity tolerance, Decreased mobility, Increased muscle spasms, Impaired perceived functional ability, Decreased range of motion, Decreased coordination, Impaired flexibility  Visit Diagnosis: Chronic left shoulder pain  Muscle weakness (generalized)  Stiffness of left shoulder, not elsewhere classified     Problem List Patient Active Problem List   Diagnosis Date Noted  . Obstructive sleep apnea 10/05/2019  . Dyslipidemia 07/09/2019  . Annual physical exam 02/02/2019  . Diverticulitis of sigmoid colon 05/24/2016  . Morbid obesity ([place] 04/25/2016  . ADD  (attention deficit disorder) 12/28/2015  . Diverticulitis of large intestine with abscess without bleeding 09/13/2015  . PCP NOTES >>>>>>>>>>>>>>>>>>>> 09/07/2015  . Elevated WBCs 01/27/2014  . Diabetes ([place] 02/17/2013  . Tachycardia 08/29/2011  . TOBACCO USER 06/29/2010   .   SLaureen Abrahams PT, DPT 02/14/20 8:43 AM    [facility] [address] [address]Phone: [phone]  Fax:  [phone] Name: Spencer SIMSONMRN: [id number] of Birth: [birth year]

## 2020-02-17 ENCOUNTER — Ambulatory Visit: Payer: BC Managed Care – PPO | Admitting: Rehabilitative and Restorative Service Providers"

## 2020-02-17 ENCOUNTER — Other Ambulatory Visit: Payer: Self-pay

## 2020-02-17 ENCOUNTER — Encounter: Payer: Self-pay | Admitting: Rehabilitative and Restorative Service Providers"

## 2020-02-17 DIAGNOSIS — M25512 Pain in left shoulder: Secondary | ICD-10-CM | POA: Diagnosis not present

## 2020-02-17 DIAGNOSIS — G8929 Other chronic pain: Secondary | ICD-10-CM

## 2020-02-17 DIAGNOSIS — M6281 Muscle weakness (generalized): Secondary | ICD-10-CM

## 2020-02-17 DIAGNOSIS — M25612 Stiffness of left shoulder, not elsewhere classified: Secondary | ICD-10-CM

## 2020-02-17 NOTE — Therapy (Signed)
[place], [place], [address] Phone: [phone]   Fax:  [phone]  Physical Therapy Treatment  Patient Details  Name: DEMARRIO MENGES MRN: [id number] Date of Birth: 07-14-1975 Referring Provider (PT): Dr. Lynne Leader   Encounter Date: 02/17/2020   PT End of Session - 02/17/20 0805    Visit Number 4    Number of Visits 12    Date for PT Re-Evaluation 03/27/20    Progress Note Due on Visit 10    PT Start Time 0801    PT Stop Time 0840    PT Time Calculation (min) 39 min    Activity Tolerance Patient tolerated treatment well    Behavior During Therapy [facility] for tasks assessed/performed           Past Medical History:  Diagnosis Date  . ADHD (attention deficit hyperactivity disorder)   . Diabetes ([place])   . Diverticulitis of colon with perforation    s/p sigmoid colectomy  . Gout   . Hernia, umbilical 3149   Laparoscopic umbilical hernia repair with mesh- s/p repair-resolved.  Marland Kitchen History of colon polyps   . Insomnia   . Obesity   . OSA (obstructive sleep apnea) dx 05-2013   Rx a Cpap 3-15, can't use - no longer has machine.    Past Surgical History:  Procedure Laterality Date  . INSERTION OF MESH N/A 02/01/2015   Procedure: INSERTION OF MESH;  Surgeon: Rolm Bookbinder, MD;  Location: [place];  Service: General;  Laterality: N/A;  . LAPAROSCOPIC SIGMOID COLECTOMY N/A 05/24/2016   Procedure: LAPAROSCOPIC SIGMOID COLECTOMY;  Surgeon: Excell Seltzer, MD;  [facility];  Service: General;  Laterality: N/A;  . LASIK Bilateral   . UMBILICAL HERNIA REPAIR N/A 02/01/2015   Procedure: LAPAROSCOPIC UMBILICAL HERNIA REPAIR WITH MESH;  Surgeon: Rolm Bookbinder, MD;  Location: [place];  Service: General;  Laterality: N/A;    There were no vitals filed for this visit.   Subjective Assessment - 02/17/20 0804    Subjective Pt. indicated feeling improvement in reduced frequency of sharp pains.  At rest 1-2/10.  Improved  dressing reported.    Pertinent History DM, ADHS, obesity    Limitations Lifting    Diagnostic tests Xrays negative    Patient Stated Goals Reduce pain    Currently in Pain? Yes    Pain Score 1     Pain Location Shoulder    Pain Orientation Left    Pain Descriptors / Indicators Aching;Sharp    Pain Onset More than a month ago    Multiple Pain Sites No                             [facility] Adult PT Treatment/Exercise - 02/17/20 0001      Shoulder Exercises: Standing   External Rotation Left   3 x 10   Theraband Level (Shoulder External Rotation) Level 3 (Green)    Internal Rotation Left;Other (comment)   3 x 10   Theraband Level (Shoulder Internal Rotation) Level 3 (Green)    Extension Both;Other (comment)   3 x10   Theraband Level (Shoulder Extension) Level 3 (Green)    Row Both;Other (comment)   3 x 10   Theraband Level (Shoulder Row) Level 3 (Green)      Shoulder Exercises: ROM/Strengthening   UBE (Upper Arm Bike) lvl 3.0 4 mins fwd/back each way      Shoulder Exercises: Stretch  Other Shoulder Stretches cross arm 30 sec x 5 c MH      Modalities   Modalities Moist Heat      Moist Heat Therapy   Number Minutes Moist Heat 5 Minutes    Moist Heat Location Shoulder   Lt     Manual Therapy   Soft tissue mobilization Lt infraspinatus            Trigger Point Dry Needling - 02/17/20 0001    Consent Given? Yes    Muscles Treated Upper Quadrant Infraspinatus   Lt   Infraspinatus Response Twitch response elicited                  PT Short Term Goals - 02/14/20 0840      PT SHORT TERM GOAL #1   Title Patient will demonstrate independent use of home exercise program to maintain progress from in clinic treatments.    Baseline 8/30: exercises currently increasing pain    Time 2    Period Weeks    Status Partially Met    Target Date 02/14/20             PT Long Term Goals - 01/31/20 0847      PT LONG TERM GOAL #1   Title Patient will  demonstrate/report pain at worst less than or equal to 2/10 to facilitate minimal limitation in daily activity secondary to pain symptoms.    Time 8    Period Weeks    Status New    Target Date 03/27/20      PT LONG TERM GOAL #2   Title Patient will demonstrate independent use of home exercise program to facilitate ability to maintain/progress functional gains from skilled physical therapy services.    Time 8    Period Weeks    Status New    Target Date 03/27/20      PT LONG TERM GOAL #3   Title Patient will demonstrate return to work/recreational activity at previous level of function without limitations secondary due to condition    Time 8    Period Weeks    Status New    Target Date 03/27/20      PT LONG TERM GOAL #4   Title Patient will demonstrate Lt [place] joint mobility WFL to facilitate usual self care, dressing, reaching overhead at PLOF s limitation due to symptoms.    Time 8    Period Weeks    Status New    Target Date 03/27/20      PT LONG TERM GOAL #5   Title Patient will demonstrate Lt UE MMT 5/5 throughout to facilitate usual lifting, carrying in functional activity to PLOF s limitation.    Time 8    Period Weeks    Status New    Target Date 03/27/20                 Plan - 02/17/20 0830    Clinical Impression Statement Pt. continued to exhibit concordant symptoms from myofascial TrP.  Improving active mobility noted while in clinic today.  Continued skilled PT services to adress.    Personal Factors and Comorbidities Other   DM, ADHS, obesity   Examination-Activity Limitations Sleep;Carry;Dressing;Hygiene/Grooming;Lift;Reach Overhead    Examination-Participation Restrictions Community Activity;Yard Work    Stability/Clinical Decision Making Stable/Uncomplicated    Rehab Potential Good    PT Frequency --   1-2x/week   PT Duration 8 weeks    PT Treatment/Interventions ADLs/Self Care Home Management;Cryotherapy;Electrical Stimulation;Iontophoresis 34m/ml  Dexamethasone;Moist Heat;Traction;Balance training;Therapeutic exercise;Therapeutic activities;Functional mobility training;Ultrasound;Neuromuscular re-education;Patient/family education;Manual techniques;Vasopneumatic Device;Taping;Passive range of motion;Spinal Manipulations;Joint Manipulations;Dry needling    PT Next Visit Plan DN as needed, strengthening for functional mobility.    PT Home Exercise Plan [id number]    Consulted and Agree with Plan of Care Patient           Patient will benefit from skilled therapeutic intervention in order to improve the following deficits and impairments:  Decreased endurance, Hypomobility, Obesity, Pain, Impaired UE functional use, Increased fascial restricitons, Decreased strength, Decreased activity tolerance, Decreased mobility, Increased muscle spasms, Impaired perceived functional ability, Decreased range of motion, Decreased coordination, Impaired flexibility  Visit Diagnosis: Chronic left shoulder pain  Muscle weakness (generalized)  Stiffness of left shoulder, not elsewhere classified     Problem List Patient Active Problem List   Diagnosis Date Noted  . Obstructive sleep apnea 10/05/2019  . Dyslipidemia 07/09/2019  . Annual physical exam 02/02/2019  . Diverticulitis of sigmoid colon 05/24/2016  . Morbid obesity ([place]) 04/25/2016  . ADD (attention deficit disorder) 12/28/2015  . Diverticulitis of large intestine with abscess without bleeding 09/13/2015  . PCP NOTES >>>>>>>>>>>>>>>>>>>> 09/07/2015  . Elevated WBCs 01/27/2014  . Diabetes ([place]) 02/17/2013  . Tachycardia 08/29/2011  . TOBACCO USER 06/29/2010    Scot Jun, PT, DPT, OCS, ATC 02/17/20  8:38 AM    [facility] Physical Therapy [address] [place], [place], [address] Phone: [phone]   Fax:  [phone]  Name: STEPHANE NIEMANN MRN: [id number] Date of Birth: 1975/07/30

## 2020-02-24 ENCOUNTER — Ambulatory Visit: Payer: BC Managed Care – PPO | Admitting: Rehabilitative and Restorative Service Providers"

## 2020-02-24 ENCOUNTER — Other Ambulatory Visit: Payer: Self-pay

## 2020-02-24 ENCOUNTER — Encounter: Payer: Self-pay | Admitting: Rehabilitative and Restorative Service Providers"

## 2020-02-24 DIAGNOSIS — M6281 Muscle weakness (generalized): Secondary | ICD-10-CM | POA: Diagnosis not present

## 2020-02-24 DIAGNOSIS — G8929 Other chronic pain: Secondary | ICD-10-CM | POA: Diagnosis not present

## 2020-02-24 DIAGNOSIS — M25512 Pain in left shoulder: Secondary | ICD-10-CM | POA: Diagnosis not present

## 2020-02-24 DIAGNOSIS — M25612 Stiffness of left shoulder, not elsewhere classified: Secondary | ICD-10-CM

## 2020-02-24 NOTE — Therapy (Signed)
[place], [place], [address] Phone: [phone]   Fax:  [phone]  Physical Therapy Treatment  Patient Details  Name: Spencer Good MRN: [id number] Date of Birth: 1976/05/12 Referring Provider (PT): Dr. Lynne Leader   Encounter Date: 02/24/2020   PT End of Session - 02/24/20 0814    Visit Number 5    Number of Visits 12    Date for PT Re-Evaluation 03/27/20    Progress Note Due on Visit 10    PT Start Time 0802    PT Stop Time 0841    PT Time Calculation (min) 39 min    Activity Tolerance Patient tolerated treatment well    Behavior During Therapy [facility] for tasks assessed/performed           Past Medical History:  Diagnosis Date  . ADHD (attention deficit hyperactivity disorder)   . Diabetes ([place])   . Diverticulitis of colon with perforation    s/p sigmoid colectomy  . Gout   . Hernia, umbilical 9937   Laparoscopic umbilical hernia repair with mesh- s/p repair-resolved.  Marland Kitchen History of colon polyps   . Insomnia   . Obesity   . OSA (obstructive sleep apnea) dx 05-2013   Rx a Cpap 3-15, can't use - no longer has machine.    Past Surgical History:  Procedure Laterality Date  . INSERTION OF MESH N/A 02/01/2015   Procedure: INSERTION OF MESH;  Surgeon: Rolm Bookbinder, MD;  Location: [place];  Service: General;  Laterality: N/A;  . LAPAROSCOPIC SIGMOID COLECTOMY N/A 05/24/2016   Procedure: LAPAROSCOPIC SIGMOID COLECTOMY;  Surgeon: Excell Seltzer, MD;  Location: [facility];  Service: General;  Laterality: N/A;  . LASIK Bilateral   . UMBILICAL HERNIA REPAIR N/A 02/01/2015   Procedure: LAPAROSCOPIC UMBILICAL HERNIA REPAIR WITH MESH;  Surgeon: Rolm Bookbinder, MD;  Location: [place];  Service: General;  Laterality: N/A;    There were no vitals filed for this visit.   Subjective Assessment - 02/24/20 0813    Subjective Pt. indicated feeling times of improvement c movements and doing more things but still has  "random" occasional times of quick pain c movement.    Pertinent History DM, ADHS, obesity    Limitations Lifting    Diagnostic tests Xrays negative    Patient Stated Goals Reduce pain    Currently in Pain? No/denies    Pain Score 0-No pain    Pain Onset More than a month ago                             [facility] Adult PT Treatment/Exercise - 02/24/20 0001      Shoulder Exercises: Standing   External Rotation Left;Theraband   c flexion punch 3 x 10   Theraband Level (Shoulder External Rotation) Level 3 (Green)    Internal Rotation Left;Theraband   in 90 deg abd   Theraband Level (Shoulder Internal Rotation) Level 3 (Green)    Row Both   3 x 15   Theraband Level (Shoulder Row) Level 4 (Blue)      Shoulder Exercises: ROM/Strengthening   UBE (Upper Arm Bike) Lvl 3.5 3 mins fwd/back each way      Shoulder Exercises: Stretch   Other Shoulder Stretches doorway er stretch 30 sec 5      Manual Therapy   Soft tissue mobilization Lt middle deltoid            Trigger Point  Dry Needling - 02/24/20 0001    Consent Given? Yes    Muscles Treated Upper Quadrant Deltoid   middle deltoid Lt   Deltoid Response Twitch response elicited                  PT Short Term Goals - 02/24/20 2122      PT SHORT TERM GOAL #1   Title Patient will demonstrate independent use of home exercise program to maintain progress from in clinic treatments.    Baseline 8/30: exercises currently increasing pain    Time 2    Period Weeks    Status Achieved    Target Date 02/14/20             PT Long Term Goals - 02/24/20 0821      PT LONG TERM GOAL #1   Title Patient will demonstrate/report pain at worst less than or equal to 2/10 to facilitate minimal limitation in daily activity secondary to pain symptoms.    Time 8    Period Weeks    Status On-going    Target Date 03/27/20      PT LONG TERM GOAL #2   Title Patient will demonstrate independent use of home exercise program  to facilitate ability to maintain/progress functional gains from skilled physical therapy services.    Time 8    Period Weeks    Status Achieved      PT LONG TERM GOAL #3   Title Patient will demonstrate return to work/recreational activity at previous level of function without limitations secondary due to condition    Time 8    Period Weeks    Status On-going    Target Date 03/27/20      PT LONG TERM GOAL #4   Title Patient will demonstrate Lt [place] joint mobility WFL to facilitate usual self care, dressing, reaching overhead at PLOF s limitation due to symptoms.    Time 8    Period Weeks    Status On-going    Target Date 03/27/20      PT LONG TERM GOAL #5   Title Patient will demonstrate Lt UE MMT 5/5 throughout to facilitate usual lifting, carrying in functional activity to PLOF s limitation.    Time 8    Period Weeks    Status On-going    Target Date 03/27/20                 Plan - 02/24/20 0818    Clinical Impression Statement Pt. demonstrated progression in stabilization/strengthening intervention c progression towards overhead movements c no pain increases but indications of fatigue in movement.  Lt middle deltoid point tenderness indicated concordant symptoms.    Personal Factors and Comorbidities Other   DM, ADHS, obesity   Examination-Activity Limitations Sleep;Carry;Dressing;Hygiene/Grooming;Lift;Reach Overhead    Examination-Participation Restrictions Community Activity;Yard Work    Stability/Clinical Decision Making Stable/Uncomplicated    Rehab Potential Good    PT Frequency --   1-2x/week   PT Duration 8 weeks    PT Treatment/Interventions ADLs/Self Care Home Management;Cryotherapy;Electrical Stimulation;Iontophoresis 4mg /ml Dexamethasone;Moist Heat;Traction;Balance training;Therapeutic exercise;Therapeutic activities;Functional mobility training;Ultrasound;Neuromuscular re-education;Patient/family education;Manual techniques;Vasopneumatic Device;Taping;Passive  range of motion;Spinal Manipulations;Joint Manipulations;Dry needling    PT Next Visit Plan Shoulder and overhead strengthening, endurance improvements    PT Home Exercise Plan [id number]    Consulted and Agree with Plan of Care Patient           Patient will benefit from skilled therapeutic intervention in order to improve the following deficits and  impairments:  Decreased endurance, Hypomobility, Obesity, Pain, Impaired UE functional use, Increased fascial restricitons, Decreased strength, Decreased activity tolerance, Decreased mobility, Increased muscle spasms, Impaired perceived functional ability, Decreased range of motion, Decreased coordination, Impaired flexibility  Visit Diagnosis: Chronic left shoulder pain  Muscle weakness (generalized)  Stiffness of left shoulder, not elsewhere classified     Problem List Patient Active Problem List   Diagnosis Date Noted  . Obstructive sleep apnea 10/05/2019  . Dyslipidemia 07/09/2019  . Annual physical exam 02/02/2019  . Diverticulitis of sigmoid colon 05/24/2016  . Morbid obesity ([place]) 04/25/2016  . ADD (attention deficit disorder) 12/28/2015  . Diverticulitis of large intestine with abscess without bleeding 09/13/2015  . PCP NOTES >>>>>>>>>>>>>>>>>>>> 09/07/2015  . Elevated WBCs 01/27/2014  . Diabetes ([place]) 02/17/2013  . Tachycardia 08/29/2011  . TOBACCO USER 06/29/2010   Scot Jun, PT, DPT, OCS, ATC 02/24/20  8:38 AM    [facility] Physical Therapy [address] [place], [place], [address] Phone: [phone]   Fax:  [phone]  Name: Spencer Good MRN: [id number] Date of Birth: Apr 17, 1976

## 2020-02-28 ENCOUNTER — Encounter: Payer: Self-pay | Admitting: Physical Therapy

## 2020-02-28 ENCOUNTER — Other Ambulatory Visit: Payer: Self-pay

## 2020-02-28 ENCOUNTER — Ambulatory Visit: Payer: BC Managed Care – PPO | Admitting: Physical Therapy

## 2020-02-28 DIAGNOSIS — M25512 Pain in left shoulder: Secondary | ICD-10-CM

## 2020-02-28 DIAGNOSIS — M25612 Stiffness of left shoulder, not elsewhere classified: Secondary | ICD-10-CM | POA: Diagnosis not present

## 2020-02-28 DIAGNOSIS — G8929 Other chronic pain: Secondary | ICD-10-CM | POA: Diagnosis not present

## 2020-02-28 DIAGNOSIS — M6281 Muscle weakness (generalized): Secondary | ICD-10-CM

## 2020-02-28 NOTE — Therapy (Signed)
[place], [place], [address] Phone: [phone]   Fax:  [phone]  Physical Therapy Treatment  Patient Details  Name: Spencer Good MRN: [id number] Date of Birth: 1976/01/10 Referring Provider (PT): Dr. Lynne Leader   Encounter Date: 02/28/2020   PT End of Session - 02/28/20 0846    Visit Number 6    Number of Visits 12    Date for PT Re-Evaluation 03/27/20    Progress Note Due on Visit 10    PT Start Time 0800    PT Stop Time 0841    PT Time Calculation (min) 41 min    Activity Tolerance Patient tolerated treatment well    Behavior During Therapy [facility] for tasks assessed/performed           Past Medical History:  Diagnosis Date  . ADHD (attention deficit hyperactivity disorder)   . Diabetes ([place])   . Diverticulitis of colon with perforation    s/p sigmoid colectomy  . Gout   . Hernia, umbilical 7416   Laparoscopic umbilical hernia repair with mesh- s/p repair-resolved.  Marland Kitchen History of colon polyps   . Insomnia   . Obesity   . OSA (obstructive sleep apnea) dx 05-2013   Rx a Cpap 3-15, can't use - no longer has machine.    Past Surgical History:  Procedure Laterality Date  . INSERTION OF MESH N/A 02/01/2015   Procedure: INSERTION OF MESH;  Surgeon: Rolm Bookbinder, MD;  Location: [place];  Service: General;  Laterality: N/A;  . LAPAROSCOPIC SIGMOID COLECTOMY N/A 05/24/2016   Procedure: LAPAROSCOPIC SIGMOID COLECTOMY;  Surgeon: Excell Seltzer, MD;  [facility];  Service: General;  Laterality: N/A;  . LASIK Bilateral   . UMBILICAL HERNIA REPAIR N/A 02/01/2015   Procedure: LAPAROSCOPIC UMBILICAL HERNIA REPAIR WITH MESH;  Surgeon: Rolm Bookbinder, MD;  Location: [place];  Service: General;  Laterality: N/A;    There were no vitals filed for this visit.   Subjective Assessment - 02/28/20 0804    Subjective Lt shoulder is sore, did a lot of laundry and repetive movements with his shoulder - having some  tingling in the elbow too.    Pertinent History DM, ADHS, obesity    Limitations Lifting    Diagnostic tests Xrays negative    Patient Stated Goals Reduce pain    Currently in Pain? Yes    Pain Score 4     Pain Location Shoulder    Pain Orientation Left    Pain Descriptors / Indicators Aching;Sore    Pain Type Acute pain    Pain Onset More than a month ago    Pain Frequency Intermittent    Aggravating Factors  quick movements, pulling sheet over himself in bed    Pain Relieving Factors holding arm close                             [facility] Adult PT Treatment/Exercise - 02/28/20 0803      Shoulder Exercises: Standing   External Rotation Left;20 reps;Theraband    Theraband Level (Shoulder External Rotation) Level 3 (Green)    Internal Rotation Left;20 reps;Theraband    Theraband Level (Shoulder Internal Rotation) Level 3 (Green)    Extension Both;20 reps;Theraband    Theraband Level (Shoulder Extension) Level 3 (Green)    Row Both;20 reps    Theraband Level (Shoulder Row) Level 3 (Green)      Shoulder Exercises: ROM/Strengthening  UBE (Upper Arm Bike) Lvl 4 3 mins fwd/back each way      Manual Therapy   Manual Therapy Soft tissue mobilization    Soft tissue mobilization Lt infraspinatus and middle deltoid            Trigger Point Dry Needling - 02/28/20 0829    Consent Given? Yes    Education Handout Provided Previously provided    Muscles Treated Upper Quadrant Infraspinatus;Deltoid    Infraspinatus Response Twitch response elicited    Deltoid Response Twitch response elicited                  PT Short Term Goals - 02/24/20 3295      PT SHORT TERM GOAL #1   Title Patient will demonstrate independent use of home exercise program to maintain progress from in clinic treatments.    Baseline 8/30: exercises currently increasing pain    Time 2    Period Weeks    Status Achieved    Target Date 02/14/20             PT Long Term Goals -  02/24/20 0821      PT LONG TERM GOAL #1   Title Patient will demonstrate/report pain at worst less than or equal to 2/10 to facilitate minimal limitation in daily activity secondary to pain symptoms.    Time 8    Period Weeks    Status On-going    Target Date 03/27/20      PT LONG TERM GOAL #2   Title Patient will demonstrate independent use of home exercise program to facilitate ability to maintain/progress functional gains from skilled physical therapy services.    Time 8    Period Weeks    Status Achieved      PT LONG TERM GOAL #3   Title Patient will demonstrate return to work/recreational activity at previous level of function without limitations secondary due to condition    Time 8    Period Weeks    Status On-going    Target Date 03/27/20      PT LONG TERM GOAL #4   Title Patient will demonstrate Lt [place] joint mobility WFL to facilitate usual self care, dressing, reaching overhead at PLOF s limitation due to symptoms.    Time 8    Period Weeks    Status On-going    Target Date 03/27/20      PT LONG TERM GOAL #5   Title Patient will demonstrate Lt UE MMT 5/5 throughout to facilitate usual lifting, carrying in functional activity to PLOF s limitation.    Time 8    Period Weeks    Status On-going    Target Date 03/27/20                 Plan - 02/28/20 0847    Clinical Impression Statement Overall pain is slowly improving, with recent flare due to increased use and repetitive aggravating motions.  Positive response to DN today and will continue to benefit from PT to maximize function.    Personal Factors and Comorbidities Other   DM, ADHS, obesity   Examination-Activity Limitations Sleep;Carry;Dressing;Hygiene/Grooming;Lift;Reach Overhead    Examination-Participation Restrictions Community Activity;Yard Work    Stability/Clinical Decision Making Stable/Uncomplicated    Rehab Potential Good    PT Frequency --   1-2x/week   PT Duration 8 weeks    PT  Treatment/Interventions ADLs/Self Care Home Management;Cryotherapy;Electrical Stimulation;Iontophoresis 4mg /ml Dexamethasone;Moist Heat;Traction;Balance training;Therapeutic exercise;Therapeutic activities;Functional mobility training;Ultrasound;Neuromuscular re-education;Patient/family education;Manual techniques;Vasopneumatic Device;Taping;Passive  range of motion;Spinal Manipulations;Joint Manipulations;Dry needling    PT Next Visit Plan Shoulder and overhead strengthening, endurance improvements    PT Home Exercise Plan [id number]    Consulted and Agree with Plan of Care Patient           Patient will benefit from skilled therapeutic intervention in order to improve the following deficits and impairments:  Decreased endurance, Hypomobility, Obesity, Pain, Impaired UE functional use, Increased fascial restricitons, Decreased strength, Decreased activity tolerance, Decreased mobility, Increased muscle spasms, Impaired perceived functional ability, Decreased range of motion, Decreased coordination, Impaired flexibility  Visit Diagnosis: Chronic left shoulder pain  Muscle weakness (generalized)  Stiffness of left shoulder, not elsewhere classified     Problem List Patient Active Problem List   Diagnosis Date Noted  . Obstructive sleep apnea 10/05/2019  . Dyslipidemia 07/09/2019  . Annual physical exam 02/02/2019  . Diverticulitis of sigmoid colon 05/24/2016  . Morbid obesity ([place]) 04/25/2016  . ADD (attention deficit disorder) 12/28/2015  . Diverticulitis of large intestine with abscess without bleeding 09/13/2015  . PCP NOTES >>>>>>>>>>>>>>>>>>>> 09/07/2015  . Elevated WBCs 01/27/2014  . Diabetes ([place]) 02/17/2013  . Tachycardia 08/29/2011  . TOBACCO USER 06/29/2010      Laureen Abrahams, PT, DPT 02/28/20 8:48 AM     [facility] Physical Therapy [address] [place], [place], [address] Phone: [phone]   Fax:  [phone]  Name: Spencer Good MRN: [id number] Date of Birth: 11-28-1975

## 2020-03-02 ENCOUNTER — Encounter: Payer: BC Managed Care – PPO | Admitting: Rehabilitative and Restorative Service Providers"

## 2020-03-02 ENCOUNTER — Telehealth: Payer: Self-pay | Admitting: *Deleted

## 2020-03-02 NOTE — Telephone Encounter (Signed)
Per CY  The pt will need a follow up appt with him (ok to use a held spot for this pt).  He uses [facility] and we will need to call and get DL once he has an appt scheduled and to see if they can place him on [facility]>    I have called and LM on VM for him to call back to get this appt scheduled.

## 2020-03-04 ENCOUNTER — Other Ambulatory Visit: Payer: Self-pay | Admitting: Internal Medicine

## 2020-03-06 ENCOUNTER — Encounter: Payer: Self-pay | Admitting: Physical Therapy

## 2020-03-06 ENCOUNTER — Encounter: Payer: BC Managed Care – PPO | Admitting: Physical Therapy

## 2020-03-07 ENCOUNTER — Ambulatory Visit: Payer: BC Managed Care – PPO | Admitting: Family Medicine

## 2020-03-07 ENCOUNTER — Other Ambulatory Visit: Payer: Self-pay

## 2020-03-07 ENCOUNTER — Encounter: Payer: Self-pay | Admitting: Family Medicine

## 2020-03-07 VITALS — BP 120/82 | HR 106 | Ht 72.0 in | Wt 339.0 lb

## 2020-03-07 DIAGNOSIS — G8929 Other chronic pain: Secondary | ICD-10-CM | POA: Diagnosis not present

## 2020-03-07 DIAGNOSIS — M25512 Pain in left shoulder: Secondary | ICD-10-CM

## 2020-03-07 NOTE — Patient Instructions (Addendum)
Thank you for coming in today.  Plan for MRI.  Recheck following MRI.  Continue PT in the interim.   Famotidine is helpful for heartburn.

## 2020-03-07 NOTE — Progress Notes (Signed)
Rito Ehrlich, am serving as a Education administrator for Dr. Lynne Leader.  Spencer Good is a 44 y.o. male who presents to [place] at [facility] today for f/u of chronic L anterior shoulder pain.  He was last seen by Dr. Georgina Snell on 01/24/20 and had a L subacromial injection.  He was also referred to PT and has completed 6 visits.  Since his last visit w/ Dr. Georgina Snell, pt reports that his ROM is better and the dry needing at PT has been great, but the pain that is across the top shoulder is sharp and that pain has not been diminished at all. States that the soreness is constant but manageable but when he moves a certain way like throwing the sheet off of him at night causes sharp pain that feels like bone on bone.   Diagnostic testing: L shoulder XR- 01/24/20   Pertinent review of systems: No fevers or chills  Relevant historical information: Sleep apnea, diabetes, morbid obesity   Exam:  BP 120/82 (BP Location: Left Arm, Patient Position: Sitting, Cuff Size: Large)   Pulse (!) 106   Ht 6' (1.829 m)   Wt (!) 339 lb (153.8 kg)   SpO2 97%   BMI 45.98 kg/m  General: Well Developed, well nourished, and in no acute distress.   MSK: Left shoulder normal-appearing Normal motion with pain with abduction. Intact strength pain with abduction.    Lab and Radiology Results DG Shoulder Left  Result Date: 01/24/2020 CLINICAL DATA:  Left shoulder pain. EXAM: LEFT SHOULDER - 2+ VIEW COMPARISON:  None. FINDINGS: There is no evidence of fracture or dislocation. There is no evidence of arthropathy or other focal bone abnormality. Soft tissues are unremarkable. IMPRESSION: Negative. Electronically Signed   By: Constance Holster M.D.   On: 01/24/2020 22:01   [place] LIMITED JOINT SPACE STRUCTURES UP LEFT(NO LINKED CHARGES)  Result Date: 02/03/2020 Diagnostic Limited MSK Ultrasound of: Left shoulder Biceps tendon intact.  Hypoechoic fluid tracking within tendon sheath. Subscapularis tendon intact  Supraspinatus tendon intact. Increased subacromial bursa thickness present. Infraspinatus tendon is intact. AC joint narrowed degenerative Impression: Biceps tenosynovitis and subacromial bursitis  Procedure: Real-time Ultrasound Guided Injection of left shoulder subacromial bursa Device: Philips Affiniti 50G Images permanently stored and available for review in the ultrasound unit. Verbal informed consent obtained. Discussed risks and benefits of procedure. Warned about infection bleeding damage to structures skin hypopigmentation and fat atrophy among others. Patient expresses understanding and agreement Time-out conducted.  Noted no overlying erythema, induration, or other signs of local infection.  Skin prepped in a sterile fashion.  Local anesthesia: Topical Ethyl chloride.  With sterile technique and under real time ultrasound guidance: 40 mg of Kenalog and 2 mL of Marcaine injected easily.  Completed without difficulty  Pain partially resolved suggesting accurate placement of the medication.  Advised to call if fevers/chills, erythema, induration, drainage, or persistent bleeding.  Images permanently stored and available for review in the ultrasound unit. Impression: Technically successful ultrasound guided injection.       Assessment and Plan: 44 y.o. male with left shoulder pain concerning for bursitis and impingement.  Not improved sufficiently with good trial of physical therapy and subacromial injection.  At this point will proceed with an MRI to further characterize cause of pain and for potential further injection or even surgical planning.  He is receiving benefit with physical therapy especially with range of motion.  Plan to continue physical therapy in the interim.  Recheck  after MRI.    Orders Placed This Encounter  Procedures  . MR SHOULDER LEFT WO CONTRAST    Standing Status:   Future    Standing Expiration Date:   03/07/2021    Order Specific Question:   What is the  patient's sedation requirement?    Answer:   No Sedation    Order Specific Question:   Does the patient have a pacemaker or implanted devices?    Answer:   No    Order Specific Question:   Preferred imaging location?    Answer:   Product/process development scientist (table limit-350lbs)    Order Specific Question:   Radiology Contrast Protocol - do NOT remove file path    Answer:   [email or web address]   No orders of the defined types were placed in this encounter.    Discussed warning signs or symptoms. Please see discharge instructions. Patient expresses understanding.   The above documentation has been reviewed and is accurate and complete Lynne Leader, M.D.  Total encounter time 20 minutes including face-to-face time with the patient and, reviewing past medical record, and charting on the date of service.

## 2020-03-09 ENCOUNTER — Other Ambulatory Visit: Payer: Self-pay | Admitting: Internal Medicine

## 2020-03-09 ENCOUNTER — Other Ambulatory Visit: Payer: Self-pay

## 2020-03-09 ENCOUNTER — Ambulatory Visit: Payer: BC Managed Care – PPO | Admitting: Rehabilitative and Restorative Service Providers"

## 2020-03-09 ENCOUNTER — Encounter: Payer: Self-pay | Admitting: Rehabilitative and Restorative Service Providers"

## 2020-03-09 DIAGNOSIS — M25612 Stiffness of left shoulder, not elsewhere classified: Secondary | ICD-10-CM | POA: Diagnosis not present

## 2020-03-09 DIAGNOSIS — M6281 Muscle weakness (generalized): Secondary | ICD-10-CM

## 2020-03-09 DIAGNOSIS — M25512 Pain in left shoulder: Secondary | ICD-10-CM

## 2020-03-09 DIAGNOSIS — G8929 Other chronic pain: Secondary | ICD-10-CM | POA: Diagnosis not present

## 2020-03-09 NOTE — Therapy (Addendum)
[place], [place], [address] Phone: [phone]   Fax:  [phone]  Physical Therapy Treatment/Progress Note/Discharge  Patient Details  Name: Spencer Good MRN: [id number] Date of Birth: [birth year] Referring Provider (PT): Dr. Lynne Leader   Encounter Date: 03/09/2020   Progress Note Reporting Period 01/31/2020 to 03/09/2020  See note below for Objective Data and Assessment of Progress/Goals.        PT End of Session - 03/09/20 8675    Visit Number 7    Number of Visits 12    Date for PT Re-Evaluation 03/27/20    Progress Note Due on Visit 17    PT Start Time 0804    PT Stop Time 0842    PT Time Calculation (min) 38 min    Activity Tolerance Patient limited by pain   end range elevation   Behavior During Therapy [facility] for tasks assessed/performed           Past Medical History:  Diagnosis Date  . ADHD (attention deficit hyperactivity disorder)   . Diabetes ([place])   . Diverticulitis of colon with perforation    s/p sigmoid colectomy  . Gout   . Hernia, umbilical 4492   Laparoscopic umbilical hernia repair with mesh- s/p repair-resolved.  Marland Kitchen History of colon polyps   . Insomnia   . Obesity   . OSA (obstructive sleep apnea) dx 05-2013   Rx a Cpap 3-15, can't use - no longer has machine.    Past Surgical History:  Procedure Laterality Date  . INSERTION OF MESH N/A 02/01/2015   Procedure: INSERTION OF MESH;  Surgeon: Rolm Bookbinder, MD;  Location: [place];  Service: General;  Laterality: N/A;  . LAPAROSCOPIC SIGMOID COLECTOMY N/A 05/24/2016   Procedure: LAPAROSCOPIC SIGMOID COLECTOMY;  Surgeon: Excell Seltzer, MD;  Location[facility];  Service: General;  Laterality: N/A;  . LASIK Bilateral   . UMBILICAL HERNIA REPAIR N/A 02/01/2015   Procedure: LAPAROSCOPIC UMBILICAL HERNIA REPAIR WITH MESH;  Surgeon: Rolm Bookbinder, MD;  Location: [place];  Service: General;  Laterality: N/A;    There were no vitals  filed for this visit.   Subjective Assessment - 03/09/20 0806    Subjective Pt. stated soreness on top of shoulder constant around 2-3/10.  Pt. stated sharp pains can still occur but getting less overall.  Saw MD, MRI on Sunday.    Pertinent History DM, ADHS, obesity    Limitations Lifting    Diagnostic tests Xrays negative    Patient Stated Goals Reduce pain    Currently in Pain? Yes    Pain Score 3     Pain Orientation Left    Pain Onset More than a month ago    Multiple Pain Sites No              [facility] PT Assessment - 03/09/20 0001      AROM   Overall AROM Comments Gross assessment of standing elevation AROM flexion, abd approx. 85% of Rt c concordant symptoms indicated at end range      Strength   Left Shoulder Flexion 5/5   mild complaint   Left Shoulder ABduction 4+/5   mild complaint   Left Shoulder Internal Rotation 5/5    Left Shoulder External Rotation 5/5                         [facility] Adult PT Treatment/Exercise - 03/09/20 0001      Shoulder  Exercises: Supine   Other Supine Exercises supine horizontal abd, d2 ext bilateral blue band 2 x 10 each      Shoulder Exercises: Standing   External Rotation Left   x 12, x 8, x6   Theraband Level (Shoulder External Rotation) Level 4 (Blue)    Internal Rotation Left   x 20, x20, x 18   Theraband Level (Shoulder Internal Rotation) Level 4 (Blue)      Shoulder Exercises: ROM/Strengthening   UBE (Upper Arm Bike) Lvl 3 3 mins fwd/back each way    Other ROM/Strengthening Exercises lat pull down 10 lbs machine x 5 (pain limited)      Manual Therapy   Manual therapy comments g4 inferior jt mobs at end range flexion, scaption, ap/pa ac jt mobs Lt g3, Lat contract/relax MET                    PT Short Term Goals - 02/24/20 5409      PT SHORT TERM GOAL #1   Title Patient will demonstrate independent use of home exercise program to maintain progress from in clinic treatments.    Baseline 8/30:  exercises currently increasing pain    Time 2    Period Weeks    Status Achieved    Target Date 02/14/20             PT Long Term Goals - 03/09/20 0816      PT LONG TERM GOAL #1   Title Patient will demonstrate/report pain at worst less than or equal to 2/10 to facilitate minimal limitation in daily activity secondary to pain symptoms.    Time 8    Period Weeks    Status On-going    Target Date 03/27/20      PT LONG TERM GOAL #2   Title Patient will demonstrate independent use of home exercise program to facilitate ability to maintain/progress functional gains from skilled physical therapy services.    Time 8    Period Weeks    Status Achieved      PT LONG TERM GOAL #3   Title Patient will demonstrate return to work/recreational activity at previous level of function without limitations secondary due to condition    Time 8    Period Weeks    Status On-going    Target Date 03/27/20      PT LONG TERM GOAL #4   Title Patient will demonstrate Lt [place] joint mobility WFL to facilitate usual self care, dressing, reaching overhead at PLOF s limitation due to symptoms.    Baseline 85% - 03/09/2020    Time 8    Period Weeks    Status On-going    Target Date 03/27/20      PT LONG TERM GOAL #5   Title Patient will demonstrate Lt UE MMT 5/5 throughout to facilitate usual lifting, carrying in functional activity to PLOF s limitation.    Time 8    Period Weeks    Status On-going    Target Date 03/27/20                 Plan - 03/09/20 8119    Clinical Impression Statement Pt. has attended 7 visits overall during course of treatment.  Pt. has reported variable reduction of symptoms slow but steady.  Continued complaints noted c soreness at rest and c activity throughout day mild to moderate at worst.  See objective data for updated information.  Current presentation showed continued end range impairment/symptoms  c flexion, abd.  Pt. to continue to benfit from skilled PT  services.  Additionally, MRI scheduled for this weekend.    Personal Factors and Comorbidities Other   DM, ADHS, obesity   Examination-Activity Limitations Sleep;Carry;Dressing;Hygiene/Grooming;Lift;Reach Overhead    Examination-Participation Restrictions Community Activity;Yard Work    Stability/Clinical Decision Making Stable/Uncomplicated    Rehab Potential Good    PT Frequency --   1-2x/week   PT Duration 8 weeks    PT Treatment/Interventions ADLs/Self Care Home Management;Cryotherapy;Electrical Stimulation;Iontophoresis 22m/ml Dexamethasone;Moist Heat;Traction;Balance training;Therapeutic exercise;Therapeutic activities;Functional mobility training;Ultrasound;Neuromuscular re-education;Patient/family education;Manual techniques;Vasopneumatic Device;Taping;Passive range of motion;Spinal Manipulations;Joint Manipulations;Dry needling    PT Next Visit Plan End range mobility gains/symptom reduction, Shoulder and overhead strengthening, endurance improvements    PT Home Exercise Plan [id number]   Consulted and Agree with Plan of Care Patient           Patient will benefit from skilled therapeutic intervention in order to improve the following deficits and impairments:  Decreased endurance, Hypomobility, Obesity, Pain, Impaired UE functional use, Increased fascial restricitons, Decreased strength, Decreased activity tolerance, Decreased mobility, Increased muscle spasms, Impaired perceived functional ability, Decreased range of motion, Decreased coordination, Impaired flexibility  Visit Diagnosis: Chronic left shoulder pain  Muscle weakness (generalized)  Stiffness of left shoulder, not elsewhere classified     Problem List Patient Active Problem List   Diagnosis Date Noted  . Obstructive sleep apnea 10/05/2019  . Dyslipidemia 07/09/2019  . Annual physical exam 02/02/2019  . Diverticulitis of sigmoid colon 05/24/2016  . [place] 04/25/2016  . ADD (attention deficit  disorder) 12/28/2015  . Diverticulitis of large intestine with abscess without bleeding 09/13/2015  . PCP NOTES >>>>>>>>>>>>>>>>>>>> 09/07/2015  . Elevated WBCs 01/27/2014  . Diabetes ([place] 02/17/2013  . Tachycardia 08/29/2011  . TOBACCO USER 06/29/2010   MScot Jun PT, DPT, OCS, ATC 03/09/20  8:32 AM  PHYSICAL THERAPY DISCHARGE SUMMARY  Visits from Start of Care: 7  Current functional level related to goals / functional outcomes: See note   Remaining deficits: See note   Education / Equipment: HEP Plan: Patient agrees to discharge.  Patient goals were partially met. Patient is being discharged due to not returning since the last visit.  ?????    MScot Jun PT, DPT, OCS, ATC 04/04/20  2:37 PM     [facility] [address] [address]Phone: [phone]  Fax:  [phone] Name: TRUDRANSH BELLANCAMRN: [id number] of Birth: [birth year]

## 2020-03-12 ENCOUNTER — Ambulatory Visit: Payer: BC Managed Care – PPO

## 2020-03-12 ENCOUNTER — Other Ambulatory Visit: Payer: Self-pay

## 2020-03-13 ENCOUNTER — Telehealth: Payer: Self-pay | Admitting: Family Medicine

## 2020-03-13 ENCOUNTER — Encounter: Payer: BC Managed Care – PPO | Admitting: Physical Therapy

## 2020-03-13 DIAGNOSIS — G8929 Other chronic pain: Secondary | ICD-10-CM

## 2020-03-13 DIAGNOSIS — M25512 Pain in left shoulder: Secondary | ICD-10-CM

## 2020-03-13 MED ORDER — LORAZEPAM 0.5 MG PO TABS: ORAL_TABLET | ORAL | 0 refills | Status: DC

## 2020-03-13 NOTE — Telephone Encounter (Signed)
Patient wanting to know if there is a way to do an open MRI?

## 2020-03-13 NOTE — Telephone Encounter (Signed)
Patient unable to complete MRI due to claustrophobia.  Prescribed Ativan for premedication for trial in the future.

## 2020-03-14 DIAGNOSIS — G4733 Obstructive sleep apnea (adult) (pediatric): Secondary | ICD-10-CM | POA: Diagnosis not present

## 2020-03-14 NOTE — Telephone Encounter (Signed)
Returned pt's call and he states that he wants to do a true open MRI vs going to [place].  Please place new order for open MRI.

## 2020-03-14 NOTE — Telephone Encounter (Signed)
Left shoulder open MRI ordered for [facility]

## 2020-03-14 NOTE — Telephone Encounter (Signed)
That MRI in [place] is technically an open MRI.  If you need a more open MRI will be a several week wait.  Would you like to do that?

## 2020-03-15 ENCOUNTER — Encounter: Payer: Self-pay | Admitting: Family Medicine

## 2020-03-16 ENCOUNTER — Encounter: Payer: BC Managed Care – PPO | Admitting: Rehabilitative and Restorative Service Providers"

## 2020-03-18 ENCOUNTER — Other Ambulatory Visit: Payer: Self-pay

## 2020-03-18 ENCOUNTER — Ambulatory Visit (INDEPENDENT_AMBULATORY_CARE_PROVIDER_SITE_OTHER): Payer: BC Managed Care – PPO

## 2020-03-18 DIAGNOSIS — M25512 Pain in left shoulder: Secondary | ICD-10-CM | POA: Diagnosis not present

## 2020-03-18 DIAGNOSIS — G8929 Other chronic pain: Secondary | ICD-10-CM

## 2020-03-18 DIAGNOSIS — M19012 Primary osteoarthritis, left shoulder: Secondary | ICD-10-CM | POA: Diagnosis not present

## 2020-03-20 NOTE — Progress Notes (Signed)
MRI shoulder shows significant tendinitis in 2 of the 3 rotator cuff tendons and the biceps tendon.  Additionally the labrum looks possibly torn.  I am concerned that this will likely require surgery.  Either schedule follow-up appoint with me to go over the MRI results in full detail as well as the imaging surgery now if you would like.

## 2020-03-24 ENCOUNTER — Ambulatory Visit (INDEPENDENT_AMBULATORY_CARE_PROVIDER_SITE_OTHER): Payer: BC Managed Care – PPO | Admitting: Family Medicine

## 2020-03-24 ENCOUNTER — Ambulatory Visit (AMBULATORY_SURGERY_CENTER): Payer: Self-pay | Admitting: *Deleted

## 2020-03-24 ENCOUNTER — Other Ambulatory Visit: Payer: Self-pay

## 2020-03-24 ENCOUNTER — Encounter: Payer: Self-pay | Admitting: Family Medicine

## 2020-03-24 ENCOUNTER — Encounter: Payer: Self-pay | Admitting: Internal Medicine

## 2020-03-24 VITALS — BP 120/82 | HR 115 | Ht 72.0 in | Wt 343.0 lb

## 2020-03-24 VITALS — Ht 72.0 in | Wt 343.2 lb

## 2020-03-24 DIAGNOSIS — G8929 Other chronic pain: Secondary | ICD-10-CM

## 2020-03-24 DIAGNOSIS — M7582 Other shoulder lesions, left shoulder: Secondary | ICD-10-CM

## 2020-03-24 DIAGNOSIS — S43432A Superior glenoid labrum lesion of left shoulder, initial encounter: Secondary | ICD-10-CM

## 2020-03-24 DIAGNOSIS — M25512 Pain in left shoulder: Secondary | ICD-10-CM

## 2020-03-24 DIAGNOSIS — Z86010 Personal history of colonic polyps: Secondary | ICD-10-CM

## 2020-03-24 MED ORDER — SUTAB 1479-225-188 MG PO TABS: 1.0000 | ORAL_TABLET | Freq: Once | ORAL | 0 refills | Status: AC

## 2020-03-24 NOTE — Patient Instructions (Addendum)
Thank you for coming in today.  You should hear from Surgery soon.  Return sooner if needed.   Could do another injection but I am not optimistic.

## 2020-03-24 NOTE — Progress Notes (Signed)
Completed covid vaccines 09-29-19  Pt is aware that care partner will wait in the car during procedure; if they feel like they will be too hot or cold to wait in the car; they may wait in the 4 th floor lobby. Patient is aware to bring only one care partner. We want them to wear a mask (we do not have any that we can provide them), practice social distancing, and we will check their temperatures when they get here.  I did remind the patient that their care partner needs to stay in the parking lot the entire time and have a cell phone available, we will call them when the pt is ready for discharge. Patient will wear mask into building.   No trouble with anesthesia, difficulty with intubation or hx/fam hx of malignant hyperthermia per pt   No egg or soy allergy  No home oxygen use   No medications for weight loss taken  emmi information given  Pt denies constipation issues   Sutab code put into RX and paper copy given to pt to show pharmacy

## 2020-03-24 NOTE — Progress Notes (Signed)
Rito Ehrlich, am serving as a Education administrator for Dr. Lynne Leader. Spencer Good is a 44 y.o. male who presents to [place] [facility] today for f/u of L shoulder pain and L shoulder MRI review.  He was last seen by Dr. Georgina Snell on 03/07/20 and noted improved L shoulder ROM but con't to have sharp pain across the superior aspect of his L shoulder.  He has completed 7 PT sessions.  Since his last visit, pt reports that the shoulder seems to be worse in the sense that it is more consistently sore. States that the sharp pain is less, but feels that may be because he is not doing those motion that cause that sharp pain.   Diagnostic testing: L shoulder MR- 03/18/20; L shoulder XR- 01/24/20   Pertinent review of systems: No fevers or chills  Relevant historical information: History diverticulitis status post partial colectomy.  Diabetes   Exam:  BP 120/82 (BP Location: Left Arm, Patient Position: Sitting, Cuff Size: Large)   Pulse (!) 115   Ht 6' (1.829 m)   Wt (!) 343 lb (155.6 kg)   SpO2 98%   BMI 46.52 kg/m  General: Well Developed, well nourished, and in no acute distress.   MSK: Left shoulder normal-appearing limited abduction.  Pain with abduction.  Intact strength.    Lab and Radiology Results EXAM: MRI OF THE LEFT SHOULDER WITHOUT CONTRAST  TECHNIQUE: Multiplanar, multisequence MR imaging of the shoulder was performed. No intravenous contrast was administered.  COMPARISON:  Plain films left shoulder 01/24/2020.  FINDINGS: Rotator cuff: Intact. Rotator cuff tendinopathy is most notable in the subscapularis and supraspinatus.  Muscles:  No atrophy or focal lesion.  Biceps long head: Intact. There is marked intrasubstance increased T2 signal in the intra-articular segment of the tendon.  Acromioclavicular Joint: Moderate osteoarthritis. Type 2 acromion. No subacromial/subdeltoid bursal fluid.  Glenohumeral Joint: Preserved.  Labrum: Marked  fraying and degeneration of the superior labrum. The superior labrum is likely torn at the biceps tendon attachment.  Bones:  No fracture, contusion or worrisome lesion.  Other: None.  IMPRESSION: Intact rotator cuff and long head of biceps. Tendinopathy appears severe in the intra-articular long head of biceps, supraspinatus and subscapularis.  Markedly frayed and irregular superior labrum at the biceps tendon attachment with an appearance highly suggestive of tear.  Moderate acromioclavicular osteoarthritis.   Electronically Signed   By: Inge Rise M.D.   On: 03/19/2020 11:03  I, Lynne Leader, personally (independently) visualized and performed the interpretation of the images attached in this note.      Assessment and Plan: 44 y.o. male with left shoulder pain due to rotator cuff tendinitis and probable SLAP tear as well as AC DJD. Spencer Good is failing conservative management with PT and subacromial injection. Discussed the MRI results and options with patient. He would like to have surgical consultation to discuss surgical options.  He would rather have it addressed definitively then continue marginal conservative management.  Referral placed today.   PDMP not reviewed this encounter. Orders Placed This Encounter  Procedures  . Ambulatory referral to Orthopedic Surgery    Referral Priority:   Routine    Referral Type:   Surgical    Referral Reason:   Specialty Services Required    Requested Specialty:   Orthopedic Surgery    Number of Visits Requested:   1   No orders of the defined types were placed in this encounter.    Discussed warning  signs or symptoms. Please see discharge instructions. Patient expresses understanding.   The above documentation has been reviewed and is accurate and complete Lynne Leader, M.D.  Total encounter time 20 minutes including face-to-face time with the patient and, reviewing past medical record, and charting on the date of  service.   MRI results and options

## 2020-03-27 ENCOUNTER — Encounter: Payer: BC Managed Care – PPO | Admitting: Physical Therapy

## 2020-03-27 ENCOUNTER — Ambulatory Visit: Payer: BC Managed Care – PPO | Admitting: Orthopedic Surgery

## 2020-03-27 DIAGNOSIS — M7502 Adhesive capsulitis of left shoulder: Secondary | ICD-10-CM

## 2020-03-29 ENCOUNTER — Encounter: Payer: Self-pay | Admitting: Orthopedic Surgery

## 2020-03-29 NOTE — Progress Notes (Signed)
Office Visit Note   Patient: Spencer Good           Date of Birth: 03/29/1976           MRN: [id number] Visit Date: 03/27/2020 Requested by: Spencer Hams, MD [place],  [place] [id number] PCP: Spencer Branch, MD  Subjective: Chief Complaint  Patient presents with  . Left Shoulder - Pain    HPI: Spencer Good is a 44 year old patient with left shoulder pain of 1 year duration.  Describes pain as well as limitation of motion.  He has tried physical therapy as well as subacromial injections.  Describes chronic pain limitation of motion but no weakness.  He works as a Designer, television/film set.  For activity he takes 2 large dogs on walks.  Patient does have diabetes.  Denies any neck pain or radicular symptoms.  MRI scan reveals severe intra-articular biceps tendinosis along with intact rotator cuff thickening of the inferior capsule and moderate AC joint arthritis.              ROS: All systems reviewed are negative as they relate to the chief complaint within the history of present illness.  Patient denies  fevers or chills.   Assessment & Plan: Visit Diagnoses:  1. Adhesive capsulitis of left shoulder     Plan: Impression is left shoulder adhesive capsulitis refractory to nonoperative treatment.  I think the biceps intra-articular tendinosis is likely the inciting factor for this.  Rotator cuff is intact.  He does have fairly significant limitation of passive range of motion at this time.  Symptoms have been ongoing for a year and have been refractory to injections and physical therapy.  Had a long talk with time today about operative and nonoperative treatment options.  In general his major problem I think is the adhesive capsulitis and biceps tendinitis.  Surgical treatment would be manipulation under anesthesia with rotator interval release and biceps tenodesis.  That would be followed by CPM machine for least 6 hours a day.  Adhesive capsulitis in my experience is typically more  refractory to treatment in diabetic patients especially after a year.  Nonetheless I think that would be his best treatment option to circumvent the rather long natural history of this process.  The risk and benefits including not limited to infection nerve vessel damage incomplete pain relief incomplete restoration of pain-free function as well as possible tenodesis laxity all discussed.  All questions answered.  Follow-Up Instructions: No follow-ups on file.   Orders:  No orders of the defined types were placed in this encounter.  No orders of the defined types were placed in this encounter.     Procedures: No procedures performed   Clinical Data: No additional findings.  Objective: Vital Signs: There were no vitals taken for this visit.  Physical Exam:   Constitutional: Patient appears well-developed HEENT:  Head: Normocephalic Eyes:EOM are normal Neck: Normal range of motion Cardiovascular: Normal rate Pulmonary/chest: Effort normal Neurologic: Patient is alert Skin: Skin is warm Psychiatric: Patient has normal mood and affect    Ortho Exam: Ortho exam demonstrates external rotation 15 degrees of abduction on the right to about 55 on the left to 20 isolated glenohumeral forward flexion on the right 175 on the left 110.  Isolated humeral abduction on the right 110 on the left about 70.  Rotator cuff strength on the left is excellent infraspinatus supraspinatus subscap muscle testing.  Motor sensory function to the left arm is intact.  No real significant AC joint tenderness to direct palpation or with crossarm adduction left side versus right.  Specialty Comments:  No specialty comments available.  Imaging: No results found.   PMFS History: Patient Active Problem List   Diagnosis Date Noted  . Obstructive sleep apnea 10/05/2019  . Dyslipidemia 07/09/2019  . Annual physical exam 02/02/2019  . Diverticulitis of sigmoid Spencer 05/24/2016  . Morbid obesity ([place])  04/25/2016  . ADD (attention deficit disorder) 12/28/2015  . Diverticulitis of large intestine with abscess without bleeding 09/13/2015  . PCP NOTES >>>>>>>>>>>>>>>>>>>> 09/07/2015  . Elevated WBCs 01/27/2014  . Diabetes ([place]) 02/17/2013  . Tachycardia 08/29/2011  . TOBACCO USER 06/29/2010   Past Medical History:  Diagnosis Date  . ADHD (attention deficit hyperactivity disorder)   . Diabetes ([place])   . Diverticulitis of Spencer with perforation    s/p sigmoid colectomy  . Gout   . Hernia, umbilical 6734   Laparoscopic umbilical hernia repair with mesh- s/p repair-resolved.  Marland Kitchen History of Spencer polyps   . Hyperlipidemia   . Hypertension   . Insomnia   . Obesity   . OSA (obstructive sleep apnea) dx 05-2013   Rx a Cpap 3-15, can't use - no longer has machine.  . Sleep apnea    doesn't use CPAP    Family History  Problem Relation Age of Onset  . Stroke Father        F, PGF  . CAD Other        MGF  . Heart disease Maternal Grandfather   . Spencer cancer Neg Hx   . Prostate cancer Neg Hx   . Diabetes Neg Hx        distant fam members  . Spencer polyps Neg Hx   . Esophageal cancer Neg Hx   . Gallbladder disease Neg Hx   . Kidney disease Neg Hx   . Rectal cancer Neg Hx   . Stomach cancer Neg Hx     Past Surgical History:  Procedure Laterality Date  . COLONOSCOPY    . INSERTION OF MESH N/A 02/01/2015   Procedure: INSERTION OF MESH;  Surgeon: Rolm Bookbinder, MD;  Location: [facility];  Service: General;  Laterality: N/A;  . LAPAROSCOPIC SIGMOID COLECTOMY N/A 05/24/2016   Procedure: LAPAROSCOPIC SIGMOID COLECTOMY;  Surgeon: Excell Seltzer, MD;  Location[facility];  Service: General;  Laterality: N/A;  . LASIK Bilateral   . UMBILICAL HERNIA REPAIR N/A 02/01/2015   Procedure: LAPAROSCOPIC UMBILICAL HERNIA REPAIR WITH MESH;  Surgeon: Rolm Bookbinder, MD;  Location: [facility];  Service: General;  Laterality: N/A;   Social History   Occupational History  . Occupation: works at a [employer]  . Smoking status: Former Smoker    Packs/day: 1.00    Years: 20.00    Pack years: 20.00    Types: Cigarettes    Start date: 07/30/1992    Quit date: 12/16/2014    Years since quitting: 5.2  . Smokeless tobacco: Never Used  . Tobacco comment: + vaping , no cigarrets   Vaping Use  . Vaping Use: Every day  Substance and Sexual Activity  . Alcohol use: Yes    Alcohol/week: 0.0 standard drinks    Comment: 09/08/2015 "I'll have a few drinks/year"  . Drug use: No    Comment: Past  experimented some in college; not a regular user when I did use-none over 15 yrs  . Sexual activity: Not Currently

## 2020-04-07 ENCOUNTER — Other Ambulatory Visit: Payer: Self-pay

## 2020-04-07 ENCOUNTER — Ambulatory Visit (AMBULATORY_SURGERY_CENTER): Payer: BC Managed Care – PPO | Admitting: Internal Medicine

## 2020-04-07 ENCOUNTER — Encounter: Payer: Self-pay | Admitting: Internal Medicine

## 2020-04-07 VITALS — BP 120/79 | HR 81 | Temp 97.7°F | Resp 24 | Ht 72.0 in | Wt 343.0 lb

## 2020-04-07 DIAGNOSIS — Z1211 Encounter for screening for malignant neoplasm of colon: Secondary | ICD-10-CM | POA: Diagnosis not present

## 2020-04-07 DIAGNOSIS — Z86010 Personal history of colonic polyps: Secondary | ICD-10-CM

## 2020-04-07 MED ORDER — SODIUM CHLORIDE 0.9 % IV SOLN: 500.0000 mL | Freq: Once | INTRAVENOUS | Status: DC

## 2020-04-07 NOTE — Op Note (Signed)
[place] Patient Name: Spencer Good Procedure Date: 04/07/2020 11:03 AM MRN: [id number] Endoscopist: Docia Chuck. Henrene Pastor MD, MD Age: 44 Referring MD:  Date of Birth: 1975/12/08 Gender: Male Account #: [id number] Procedure:                Colonoscopy Indications:              High risk colon cancer surveillance: Personal                            history of non-advanced adenoma. Patient with                            history of perforated diverticulitis. Preoperative                            colonoscopy 2016 with nonadvanced adenoma.                            Subsequent laparoscopic sigmoid colon resection                            2017. Has had no clinical issues since Medicines:                Monitored Anesthesia Care Procedure:                Pre-Anesthesia Assessment:                           - Prior to the procedure, a History and Physical                            was performed, and patient medications and                            allergies were reviewed. The patient's tolerance of                            previous anesthesia was also reviewed. The risks                            and benefits of the procedure and the sedation                            options and risks were discussed with the patient.                            All questions were answered, and informed consent                            was obtained. Prior Anticoagulants: The patient has                            taken no previous anticoagulant or antiplatelet  agents. ASA Grade Assessment: II - A patient with                            mild systemic disease. After reviewing the risks                            and benefits, the patient was deemed in                            satisfactory condition to undergo the procedure.                           After obtaining informed consent, the colonoscope                            was passed under direct vision.  Throughout the                            procedure, the patient's blood pressure, pulse, and                            oxygen saturations were monitored continuously. The                            Colonoscope was introduced through the anus and                            advanced to the the cecum, identified by                            appendiceal orifice and ileocecal valve. The                            ileocecal valve, appendiceal orifice, and rectum                            were photographed. The quality of the bowel                            preparation was excellent. The colonoscopy was                            performed without difficulty. The patient tolerated                            the procedure well. The bowel preparation used was                            SUPREP via split dose instruction. Scope In: 11:14:02 AM Scope Out: 11:24:07 AM Scope Withdrawal Time: 0 hours 8 minutes 45 seconds  Total Procedure Duration: 0 hours 10 minutes 5 seconds  Findings:                 Multiple diverticula were found in the sigmoid  colon. The area of prior surgery appeared                            unremarkable (30 cm from the anal verge).                           The exam was otherwise without abnormality on                            direct and retroflexion views. Complications:            No immediate complications. Estimated blood loss:                            None. Estimated Blood Loss:     Estimated blood loss: none. Impression:               - Diverticulosis in the sigmoid colon.                           - The examination was otherwise normal on direct                            and retroflexion views.                           - No specimens collected. Recommendation:           - Repeat colonoscopy in 10 years for surveillance.                           - Patient has a contact number available for                            emergencies.  The signs and symptoms of potential                            delayed complications were discussed with the                            patient. Return to normal activities tomorrow.                            Written discharge instructions were provided to the                            patient.                           - Resume previous diet.                           - Continue present medications. Docia Chuck. Henrene Pastor MD, MD 04/07/2020 11:38:30 AM This report has been signed electronically.

## 2020-04-07 NOTE — Progress Notes (Signed)
A/ox3, pleased with MAC, report to RN 

## 2020-04-07 NOTE — Patient Instructions (Addendum)
Handout on diverticulosis given to you today    YOU HAD AN ENDOSCOPIC PROCEDURE TODAY AT [facility]:   Refer to the procedure report that was given to you for any specific questions about what was found during the examination.  If the procedure report does not answer your questions, please call your gastroenterologist to clarify.  If you requested that your care partner not be given the details of your procedure findings, then the procedure report has been included in a sealed envelope for you to review at your convenience later.  YOU SHOULD EXPECT: Some feelings of bloating in the abdomen. Passage of more gas than usual.  Walking can help get rid of the air that was put into your GI tract during the procedure and reduce the bloating. If you had a lower endoscopy (such as a colonoscopy or flexible sigmoidoscopy) you may notice spotting of blood in your stool or on the toilet paper. If you underwent a bowel prep for your procedure, you may not have a normal bowel movement for a few days.  Please Note:  You might notice some irritation and congestion in your nose or some drainage.  This is from the oxygen used during your procedure.  There is no need for concern and it should clear up in a day or so.  SYMPTOMS TO REPORT IMMEDIATELY:   Following lower endoscopy (colonoscopy or flexible sigmoidoscopy):  Excessive amounts of blood in the stool  Significant tenderness or worsening of abdominal pains  Swelling of the abdomen that is new, acute  Fever of 100F or higher   Following upper endoscopy (EGD)  Vomiting of blood or coffee ground material  New chest pain or pain under the shoulder blades  Painful or persistently difficult swallowing  New shortness of breath  Fever of 100F or higher  Black, tarry-looking stools  For urgent or emergent issues, a gastroenterologist can be reached at any hour by calling [phone]. Do not use MyChart messaging for urgent concerns.     DIET:  We do recommend a small meal at first, but then you may proceed to your regular diet.  Drink plenty of fluids but you should avoid alcoholic beverages for 24 hours.  ACTIVITY:  You should plan to take it easy for the rest of today and you should NOT DRIVE or use heavy machinery until tomorrow (because of the sedation medicines used during the test).    FOLLOW UP: Our staff will call the number listed on your records 48-72 hours following your procedure to check on you and address any questions or concerns that you may have regarding the information given to you following your procedure. If we do not reach you, we will leave a message.  We will attempt to reach you two times.  During this call, we will ask if you have developed any symptoms of COVID 19. If you develop any symptoms (ie: fever, flu-like symptoms, shortness of breath, cough etc.) before then, please call [phone].  If you test positive for Covid 19 in the 2 weeks post procedure, please call and report this information to [place].    If any biopsies were taken you will be contacted by phone or by letter within the next 1-3 weeks.  Please call us at [phone] if you have not heard about the biopsies in 3 weeks.    SIGNATURES/CONFIDENTIALITY: You and/or your care partner have signed paperwork which will be entered into your electronic medical record.  These  signatures attest to the fact that that the information above on your After Visit Summary has been reviewed and is understood.  Full responsibility of the confidentiality of this discharge information lies with you and/or your care-partner. 

## 2020-04-07 NOTE — Progress Notes (Signed)
Pt's states no medical or surgical changes since previsit or office visit. 

## 2020-04-10 ENCOUNTER — Encounter: Payer: Self-pay | Admitting: Internal Medicine

## 2020-04-10 ENCOUNTER — Other Ambulatory Visit: Payer: BC Managed Care – PPO

## 2020-04-10 DIAGNOSIS — Z79899 Other long term (current) drug therapy: Secondary | ICD-10-CM | POA: Diagnosis not present

## 2020-04-10 DIAGNOSIS — F902 Attention-deficit hyperactivity disorder, combined type: Secondary | ICD-10-CM | POA: Diagnosis not present

## 2020-04-10 DIAGNOSIS — G4733 Obstructive sleep apnea (adult) (pediatric): Secondary | ICD-10-CM | POA: Diagnosis not present

## 2020-04-10 DIAGNOSIS — F419 Anxiety disorder, unspecified: Secondary | ICD-10-CM | POA: Diagnosis not present

## 2020-04-11 ENCOUNTER — Telehealth: Payer: Self-pay | Admitting: *Deleted

## 2020-04-11 NOTE — Telephone Encounter (Signed)
  Follow up Call-  Call back number 04/07/2020  Post procedure Call Back phone  # [phone]  Permission to leave phone message Yes  Some recent data might be hidden     Patient questions:  Do you have a fever, pain , or abdominal swelling? No. Pain Score  0 *  Have you tolerated food without any problems? Yes.    Have you been able to return to your normal activities? Yes.    Do you have any questions about your discharge instructions: Diet   No. Medications  No. Follow up visit  No.  Do you have questions or concerns about your Care? No.  Actions: * If pain score is 4 or above: 1. No action needed, pain <4.Have you developed a fever since your procedure? no  2.   Have you had an respiratory symptoms (SOB or cough) since your procedure? no  3.   Have you tested positive for COVID 19 since your procedure no  4.   Have you had any family members/close contacts diagnosed with the COVID 19 since your procedure?  no   If yes to any of these questions please route to Joylene John, RN and Joella Prince, RN

## 2020-04-12 ENCOUNTER — Other Ambulatory Visit: Payer: Self-pay

## 2020-04-13 DIAGNOSIS — G4733 Obstructive sleep apnea (adult) (pediatric): Secondary | ICD-10-CM | POA: Diagnosis not present

## 2020-04-19 NOTE — Progress Notes (Signed)
[facility] [place], [place] AT [address] [place] [address] Phone: [phone] Fax: [phone]      Your procedure is scheduled on Monday November 8th.  Report to [facility] Main Entrance "A" at 11:00 A.M., and check in at the Admitting office.  Call this number if you have problems the morning of surgery:  [phone]  Call [phone] if you have any questions prior to your surgery date Monday-Friday 8am-4pm    Remember:  Do not eat after midnight the night before your surgery   Enhanced Recovery after Surgery for Orthopedics Enhanced Recovery after Surgery is a protocol used to improve the stress on your body and your recovery after surgery.  Patient Instructions  . The night before surgery:  o No food after midnight. ONLY clear liquids after midnight   . The day of surgery (if you have diabetes): o  o Drink ONE (1) Gatorade 2 (G2) by __10:00am___ the morning of surgery o This drink was given to you during your hospital  pre-op appointment visit.  o Color of the Gatorade may vary. Red is not allowed. o Nothing else to drink after completing the  Gatorade 2 (G2).         If you have questions, please contact your surgeon's office.   You may drink clear liquids until 10:00am the morning of your surgery.   Clear liquids allowed are: Water, Non-Citrus Juices (without pulp), Carbonated Beverages, Clear Tea, Black Coffee Only, and Gatorade    Take these medicines the morning of surgery with A SIP OF WATER   fenofibrate 160 MG tablet  VYVANSE 50 MG capsule   WHAT DO I DO ABOUT MY DIABETES MEDICATION?   Marland Kitchen Do not take oral diabetes medicines (pills) the morning of surgery. .  . HOLD your Jardiance day before surgery and day of surgery  HOLD Metformin on day of surgery  . The day of surgery, do not take other diabetes injectables, including Byetta (exenatide), Bydureon  (exenatide ER), Victoza (liraglutide), or Trulicity (dulaglutide).   HOW TO MANAGE YOUR DIABETES BEFORE AND AFTER SURGERY  Why is it important to control my blood sugar before and after surgery? . Improving blood sugar levels before and after surgery helps healing and can limit problems. . A way of improving blood sugar control is eating a healthy diet by: o  Eating less sugar and carbohydrates o  Increasing activity/exercise o  Talking with your doctor about reaching your blood sugar goals . High blood sugars (greater than 180 mg/dL) can raise your risk of infections and slow your recovery, so you will need to focus on controlling your diabetes during the weeks before surgery. . Make sure that the doctor who takes care of your diabetes knows about your planned surgery including the date and location.  How do I manage my blood sugar before surgery? . Check your blood sugar at least 4 times a day, starting 2 days before surgery, to make sure that the level is not too high or low. . Check your blood sugar the morning of your surgery when you wake up and every 2 hours until you get to the Short Stay unit. o If your blood sugar is less than 70 mg/dL, you will need to treat for low blood sugar: - Do not take insulin. - Treat a low blood sugar (less than 70 mg/dL) with  cup of clear juice (cranberry or apple),  4 glucose tablets, OR glucose gel. - Recheck blood sugar in 15 minutes after treatment (to make sure it is greater than 70 mg/dL). If your blood sugar is not greater than 70 mg/dL on recheck, call [phone] for further instructions. . Report your blood sugar to the short stay nurse when you get to Short Stay.  . If you are admitted to the hospital after surgery: o Your blood sugar will be checked by the staff and you will probably be given insulin after surgery (instead of oral diabetes medicines) to make sure you have good blood sugar levels. o The goal for blood sugar control after  surgery is 80-180 mg/dL.  As of today, STOP taking any Aspirin (unless otherwise instructed by your surgeon) Aleve, Naproxen, Ibuprofen, Motrin, Advil, Goody's, BC's, all herbal medications, fish oil, and all vitamins.                      Do not wear jewelry            Do not wear lotions, powders, colognes, or deodorant.            Do not shave 48 hours prior to surgery.  Men may shave face and neck.            Do not bring valuables to the hospital.            [facility] is not responsible for any belongings or valuables.  Do NOT Smoke (Tobacco/Vaping) or drink Alcohol 24 hours prior to your procedure If you use a CPAP at night, you may bring all equipment for your overnight stay.   Contacts, glasses, dentures or bridgework may not be worn into surgery.      For patients admitted to the hospital, discharge time will be determined by your treatment team.   Patients discharged the day of surgery will not be allowed to drive home, and someone needs to stay with them for 24 hours.    Special instructions:   [place]- Preparing For Surgery  Before surgery, you can play an important role. Because skin is not sterile, your skin needs to be as free of germs as possible. You can reduce the number of germs on your skin by washing with CHG (chlorahexidine gluconate) Soap before surgery.  CHG is an antiseptic cleaner which kills germs and bonds with the skin to continue killing germs even after washing.    Oral Hygiene is also important to reduce your risk of infection.  Remember - BRUSH YOUR TEETH THE MORNING OF SURGERY WITH YOUR REGULAR TOOTHPASTE  Please do not use if you have an allergy to CHG or antibacterial soaps. If your skin becomes reddened/irritated stop using the CHG.  Do not shave (including legs and underarms) for at least 48 hours prior to first CHG shower. It is OK to shave your face.  Please follow these instructions carefully.   1. Shower the NIGHT BEFORE SURGERY and  the MORNING OF SURGERY with CHG Soap.   2. If you chose to wash your hair, wash your hair first as usual with your normal shampoo.  3. After you shampoo, rinse your hair and body thoroughly to remove the shampoo.  4. Use CHG as you would any other liquid soap. You can apply CHG directly to the skin and wash gently with a scrungie or a clean washcloth.   5. Apply the CHG Soap to your body ONLY FROM THE NECK DOWN.  Do not use  on open wounds or open sores. Avoid contact with your eyes, ears, mouth and genitals (private parts). Wash Face and genitals (private parts)  with your normal soap.   6. Wash thoroughly, paying special attention to the area where your surgery will be performed.  7. Thoroughly rinse your body with warm water from the neck down.  8. DO NOT shower/wash with your normal soap after using and rinsing off the CHG Soap.  9. Pat yourself dry with a CLEAN TOWEL.  10. Wear CLEAN PAJAMAS to bed the night before surgery  11. Place CLEAN SHEETS on your bed the night of your first shower and DO NOT SLEEP WITH PETS.   Day of Surgery: Wear Clean/Comfortable clothing the morning of surgery Do not apply any deodorants/lotions.   Remember to brush your teeth WITH YOUR REGULAR TOOTHPASTE.   Please read over the following fact sheets that you were given.

## 2020-04-20 ENCOUNTER — Other Ambulatory Visit (HOSPITAL_COMMUNITY)
Admission: RE | Admit: 2020-04-20 | Discharge: 2020-04-20 | Disposition: A | Discharge status: Home / Self Care | Payer: BC Managed Care – PPO | Source: Ambulatory Visit | Attending: Orthopedic Surgery | Admitting: Orthopedic Surgery

## 2020-04-20 ENCOUNTER — Encounter (HOSPITAL_COMMUNITY): Payer: Self-pay

## 2020-04-20 ENCOUNTER — Encounter (HOSPITAL_COMMUNITY)
Admission: RE | Admit: 2020-04-20 | Discharge: 2020-04-20 | Disposition: A | Discharge status: Home / Self Care | Payer: BC Managed Care – PPO | Source: Ambulatory Visit | Attending: Orthopedic Surgery | Admitting: Orthopedic Surgery

## 2020-04-20 ENCOUNTER — Other Ambulatory Visit: Payer: Self-pay

## 2020-04-20 DIAGNOSIS — Z01812 Encounter for preprocedural laboratory examination: Secondary | ICD-10-CM | POA: Insufficient documentation

## 2020-04-20 DIAGNOSIS — Z01818 Encounter for other preprocedural examination: Secondary | ICD-10-CM | POA: Diagnosis not present

## 2020-04-20 DIAGNOSIS — Z20822 Contact with and (suspected) exposure to covid-19: Secondary | ICD-10-CM | POA: Insufficient documentation

## 2020-04-20 DIAGNOSIS — I10 Essential (primary) hypertension: Secondary | ICD-10-CM | POA: Insufficient documentation

## 2020-04-20 LAB — BASIC METABOLIC PANEL
Anion gap: 11 (ref 5–15)
BUN: 15 mg/dL (ref 6–20)
CO2: 19 mmol/L — ABNORMAL LOW (ref 22–32)
Calcium: 9.2 mg/dL (ref 8.9–10.3)
Chloride: 108 mmol/L (ref 98–111)
Creatinine, Ser: 1.06 mg/dL (ref 0.61–1.24)
GFR, Estimated: >60 mL/min (ref >60)
Glucose, Bld: 157 mg/dL — ABNORMAL HIGH (ref 70–99)
Potassium: 4.3 mmol/L (ref 3.5–5.1)
Sodium: 138 mmol/L (ref 135–145)

## 2020-04-20 LAB — HEMOGLOBIN A1C
Hgb A1c MFr Bld: 7.5 % — ABNORMAL HIGH (ref 4.8–5.6)
Mean Plasma Glucose: 168.55 mg/dL

## 2020-04-20 LAB — CBC
HCT: 47.2 % (ref 39.0–52.0)
Hemoglobin: 15.4 g/dL (ref 13.0–17.0)
MCH: 27.8 pg (ref 26.0–34.0)
MCHC: 32.6 g/dL (ref 30.0–36.0)
MCV: 85.2 fL (ref 80.0–100.0)
Platelets: 277 10*3/uL (ref 150–400)
RBC: 5.54 MIL/uL (ref 4.22–5.81)
RDW: 14.2 % (ref 11.5–15.5)
WBC: 9 10*3/uL (ref 4.0–10.5)
nRBC: 0 % (ref 0.0–0.2)

## 2020-04-20 LAB — SARS CORONAVIRUS 2 (TAT 6-24 HRS): SARS Coronavirus 2: NEGATIVE

## 2020-04-20 LAB — GLUCOSE, CAPILLARY: Glucose-Capillary: 159 mg/dL — ABNORMAL HIGH (ref 70–99)

## 2020-04-21 MED ORDER — DEXTROSE 5 % IV SOLN
3.0000 g | INTRAVENOUS | Status: DC
  Filled 2020-04-21: qty 3000

## 2020-04-24 ENCOUNTER — Ambulatory Visit (HOSPITAL_COMMUNITY): Payer: BC Managed Care – PPO | Admitting: Certified Registered Nurse Anesthetist

## 2020-04-24 ENCOUNTER — Encounter (HOSPITAL_COMMUNITY)
Admission: RE | Disposition: A | Discharge status: Home / Self Care | Payer: Self-pay | Source: Home / Self Care | Attending: Orthopedic Surgery

## 2020-04-24 ENCOUNTER — Encounter (HOSPITAL_COMMUNITY): Payer: Self-pay | Admitting: Orthopedic Surgery

## 2020-04-24 ENCOUNTER — Ambulatory Visit (HOSPITAL_COMMUNITY)
Admission: RE | Admit: 2020-04-24 | Discharge: 2020-04-24 | Disposition: A | Discharge status: Home / Self Care | Payer: BC Managed Care – PPO | Attending: Orthopedic Surgery | Admitting: Orthopedic Surgery

## 2020-04-24 ENCOUNTER — Other Ambulatory Visit: Payer: Self-pay

## 2020-04-24 ENCOUNTER — Ambulatory Visit (HOSPITAL_COMMUNITY): Payer: BC Managed Care – PPO | Admitting: Physician Assistant

## 2020-04-24 DIAGNOSIS — M7522 Bicipital tendinitis, left shoulder: Secondary | ICD-10-CM | POA: Insufficient documentation

## 2020-04-24 DIAGNOSIS — Z7982 Long term (current) use of aspirin: Secondary | ICD-10-CM | POA: Diagnosis not present

## 2020-04-24 DIAGNOSIS — Z9049 Acquired absence of other specified parts of digestive tract: Secondary | ICD-10-CM | POA: Diagnosis not present

## 2020-04-24 DIAGNOSIS — G4733 Obstructive sleep apnea (adult) (pediatric): Secondary | ICD-10-CM | POA: Diagnosis not present

## 2020-04-24 DIAGNOSIS — S43432A Superior glenoid labrum lesion of left shoulder, initial encounter: Secondary | ICD-10-CM

## 2020-04-24 DIAGNOSIS — E119 Type 2 diabetes mellitus without complications: Secondary | ICD-10-CM | POA: Diagnosis not present

## 2020-04-24 DIAGNOSIS — Z7984 Long term (current) use of oral hypoglycemic drugs: Secondary | ICD-10-CM | POA: Insufficient documentation

## 2020-04-24 DIAGNOSIS — Z79899 Other long term (current) drug therapy: Secondary | ICD-10-CM | POA: Diagnosis not present

## 2020-04-24 DIAGNOSIS — M7502 Adhesive capsulitis of left shoulder: Secondary | ICD-10-CM | POA: Insufficient documentation

## 2020-04-24 DIAGNOSIS — Z87891 Personal history of nicotine dependence: Secondary | ICD-10-CM | POA: Diagnosis not present

## 2020-04-24 DIAGNOSIS — G8918 Other acute postprocedural pain: Secondary | ICD-10-CM | POA: Diagnosis not present

## 2020-04-24 HISTORY — PX: SHOULDER ARTHROSCOPY: SHX128

## 2020-04-24 LAB — GLUCOSE, CAPILLARY
Glucose-Capillary: 156 mg/dL — ABNORMAL HIGH (ref 70–99)
Glucose-Capillary: 154 mg/dL — ABNORMAL HIGH (ref 70–99)

## 2020-04-24 SURGERY — ARTHROSCOPY, SHOULDER
Anesthesia: Regional | Site: Shoulder | Laterality: Left

## 2020-04-24 MED ORDER — HYDROMORPHONE HCL 1 MG/ML IJ SOLN
INTRAMUSCULAR | Status: AC
  Filled 2020-04-24: qty 1

## 2020-04-24 MED ORDER — LIDOCAINE 2% (20 MG/ML) 5 ML SYRINGE
INTRAMUSCULAR | Status: DC | PRN
  Administered 2020-04-24: 40 mg via INTRAVENOUS

## 2020-04-24 MED ORDER — EPINEPHRINE PF 1 MG/ML IJ SOLN
INTRAMUSCULAR | Status: AC
  Filled 2020-04-24: qty 1

## 2020-04-24 MED ORDER — FENTANYL CITRATE (PF) 100 MCG/2ML IJ SOLN
INTRAMUSCULAR | Status: AC
  Filled 2020-04-24: qty 2

## 2020-04-24 MED ORDER — BUPIVACAINE LIPOSOME 1.3 % IJ SUSP
INTRAMUSCULAR | Status: DC | PRN
  Administered 2020-04-24: 10 mL via PERINEURAL

## 2020-04-24 MED ORDER — DEXTROSE 5 % IV SOLN
INTRAVENOUS | Status: DC | PRN
  Administered 2020-04-24: 3 g via INTRAVENOUS

## 2020-04-24 MED ORDER — PROPOFOL 10 MG/ML IV BOLUS
INTRAVENOUS | Status: AC
  Filled 2020-04-24: qty 20

## 2020-04-24 MED ORDER — OXYCODONE HCL 5 MG/5ML PO SOLN: 5.0000 mg | Freq: Once | ORAL | Status: DC | PRN

## 2020-04-24 MED ORDER — MIDAZOLAM HCL 2 MG/2ML IJ SOLN
INTRAMUSCULAR | Status: AC
  Filled 2020-04-24: qty 2

## 2020-04-24 MED ORDER — PHENYLEPHRINE HCL-NACL 10-0.9 MG/250ML-% IV SOLN
INTRAVENOUS | Status: DC | PRN
  Administered 2020-04-24: 10 ug/min via INTRAVENOUS

## 2020-04-24 MED ORDER — PROMETHAZINE HCL 25 MG/ML IJ SOLN: 6.2500 mg | INTRAMUSCULAR | Status: DC | PRN

## 2020-04-24 MED ORDER — FENTANYL CITRATE (PF) 250 MCG/5ML IJ SOLN
INTRAMUSCULAR | Status: DC | PRN
  Administered 2020-04-24 (×2): 50 ug via INTRAVENOUS
  Administered 2020-04-24: 100 ug via INTRAVENOUS
  Administered 2020-04-24: 50 ug via INTRAVENOUS

## 2020-04-24 MED ORDER — CHLORHEXIDINE GLUCONATE 0.12 % MT SOLN
15.0000 mL | Freq: Once | OROMUCOSAL | Status: AC
  Administered 2020-04-24: 15 mL via OROMUCOSAL
  Filled 2020-04-24: qty 15

## 2020-04-24 MED ORDER — OXYCODONE HCL 5 MG PO TABS: 5.0000 mg | ORAL_TABLET | Freq: Once | ORAL | Status: DC | PRN

## 2020-04-24 MED ORDER — ONDANSETRON HCL 4 MG/2ML IJ SOLN
INTRAMUSCULAR | Status: AC
  Filled 2020-04-24: qty 2

## 2020-04-24 MED ORDER — DEXAMETHASONE SODIUM PHOSPHATE 10 MG/ML IJ SOLN
INTRAMUSCULAR | Status: DC | PRN
  Administered 2020-04-24: 10 mg via INTRAVENOUS

## 2020-04-24 MED ORDER — LACTATED RINGERS IV SOLN: INTRAVENOUS | Status: DC

## 2020-04-24 MED ORDER — METHOCARBAMOL 500 MG PO TABS: 500.0000 mg | ORAL_TABLET | Freq: Three times a day (TID) | ORAL | 0 refills | Status: DC | PRN

## 2020-04-24 MED ORDER — KETOROLAC TROMETHAMINE 30 MG/ML IJ SOLN: 30.0000 mg | Freq: Once | INTRAMUSCULAR | Status: DC

## 2020-04-24 MED ORDER — OXYCODONE-ACETAMINOPHEN 5-325 MG PO TABS
1.0000 | ORAL_TABLET | ORAL | 0 refills | Status: DC | PRN
Start: 2020-04-24 — End: 2020-07-17

## 2020-04-24 MED ORDER — DEXAMETHASONE SODIUM PHOSPHATE 10 MG/ML IJ SOLN
INTRAMUSCULAR | Status: AC
  Filled 2020-04-24: qty 1

## 2020-04-24 MED ORDER — ROCURONIUM BROMIDE 10 MG/ML (PF) SYRINGE
PREFILLED_SYRINGE | INTRAVENOUS | Status: AC
  Filled 2020-04-24: qty 10

## 2020-04-24 MED ORDER — FENTANYL CITRATE (PF) 250 MCG/5ML IJ SOLN
INTRAMUSCULAR | Status: AC
  Filled 2020-04-24: qty 5

## 2020-04-24 MED ORDER — SODIUM CHLORIDE 0.9 % IR SOLN
Status: DC | PRN
  Administered 2020-04-24 (×4): 3000 mL

## 2020-04-24 MED ORDER — HYDROMORPHONE HCL 1 MG/ML IJ SOLN
0.2500 mg | INTRAMUSCULAR | Status: DC | PRN
  Administered 2020-04-24: 0.25 mg via INTRAVENOUS

## 2020-04-24 MED ORDER — SUGAMMADEX SODIUM 200 MG/2ML IV SOLN
INTRAVENOUS | Status: DC | PRN
  Administered 2020-04-24: 400 mg via INTRAVENOUS

## 2020-04-24 MED ORDER — PROPOFOL 10 MG/ML IV BOLUS
INTRAVENOUS | Status: DC | PRN
  Administered 2020-04-24: 200 mg via INTRAVENOUS

## 2020-04-24 MED ORDER — ACETAMINOPHEN 10 MG/ML IV SOLN: 1000.0000 mg | Freq: Once | INTRAVENOUS | Status: DC | PRN

## 2020-04-24 MED ORDER — PHENYLEPHRINE HCL (PRESSORS) 10 MG/ML IV SOLN
INTRAVENOUS | Status: AC
  Filled 2020-04-24: qty 1

## 2020-04-24 MED ORDER — POVIDONE-IODINE 7.5 % EX SOLN: Freq: Once | CUTANEOUS | Status: DC

## 2020-04-24 MED ORDER — POVIDONE-IODINE 10 % EX SWAB
2.0000 "application " | Freq: Once | CUTANEOUS | Status: AC
  Administered 2020-04-24: 2 via TOPICAL

## 2020-04-24 MED ORDER — MIDAZOLAM HCL 2 MG/2ML IJ SOLN: 2.0000 mg | Freq: Once | INTRAMUSCULAR | Status: AC

## 2020-04-24 MED ORDER — BUPIVACAINE HCL (PF) 0.25 % IJ SOLN
INTRAMUSCULAR | Status: AC
  Filled 2020-04-24: qty 30

## 2020-04-24 MED ORDER — ORAL CARE MOUTH RINSE: 15.0000 mL | Freq: Once | OROMUCOSAL | Status: AC

## 2020-04-24 MED ORDER — MIDAZOLAM HCL 2 MG/2ML IJ SOLN
INTRAMUSCULAR | Status: AC
  Administered 2020-04-24: 2 mg via INTRAVENOUS
  Filled 2020-04-24: qty 2

## 2020-04-24 MED ORDER — ROCURONIUM BROMIDE 10 MG/ML (PF) SYRINGE
PREFILLED_SYRINGE | INTRAVENOUS | Status: DC | PRN
  Administered 2020-04-24: 10 mg via INTRAVENOUS
  Administered 2020-04-24: 50 mg via INTRAVENOUS
  Administered 2020-04-24: 20 mg via INTRAVENOUS

## 2020-04-24 MED ORDER — BUPIVACAINE HCL (PF) 0.5 % IJ SOLN
INTRAMUSCULAR | Status: DC | PRN
  Administered 2020-04-24: 15 mL via PERINEURAL

## 2020-04-24 MED ORDER — ONDANSETRON HCL 4 MG/2ML IJ SOLN
INTRAMUSCULAR | Status: DC | PRN
  Administered 2020-04-24: 4 mg via INTRAVENOUS

## 2020-04-24 MED ORDER — EPINEPHRINE PF 1 MG/ML IJ SOLN
INTRAMUSCULAR | Status: DC | PRN
  Administered 2020-04-24 (×2): 1 mg

## 2020-04-24 MED ORDER — LIDOCAINE 2% (20 MG/ML) 5 ML SYRINGE
INTRAMUSCULAR | Status: AC
  Filled 2020-04-24: qty 5

## 2020-04-24 SURGICAL SUPPLY — 45 items
ALCOHOL 70% 16 OZ (MISCELLANEOUS) ×2 IMPLANT
BLADE EXCALIBUR 4.0X13 (MISCELLANEOUS) ×2 IMPLANT
BLADE SURG 11 STRL SS (BLADE) ×2 IMPLANT
CANNULA TWIST IN 8.25X7CM (CANNULA) ×2 IMPLANT
COVER WAND RF STERILE (DRAPES) ×2 IMPLANT
DRAPE IMP U-DRAPE 54X76 (DRAPES) ×2 IMPLANT
DRAPE INCISE IOBAN 66X45 STRL (DRAPES) ×2 IMPLANT
DRAPE STERI 35X30 U-POUCH (DRAPES) ×2 IMPLANT
DRAPE U-SHAPE 47X51 STRL (DRAPES) ×4 IMPLANT
DRSG AQUACEL AG ADV 3.5X 4 (GAUZE/BANDAGES/DRESSINGS) ×2 IMPLANT
DRSG TEGADERM 4X4.75 (GAUZE/BANDAGES/DRESSINGS) ×2 IMPLANT
DURAPREP 26ML APPLICATOR (WOUND CARE) ×2 IMPLANT
DW OUTFLOW CASSETTE/TUBE SET (MISCELLANEOUS) ×2 IMPLANT
ELECT REM PT RETURN 9FT ADLT (ELECTROSURGICAL) ×2
ELECTRODE REM PT RTRN 9FT ADLT (ELECTROSURGICAL) ×1 IMPLANT
GAUZE SPONGE 4X4 12PLY STRL (GAUZE/BANDAGES/DRESSINGS) ×2 IMPLANT
GAUZE XEROFORM 1X8 LF (GAUZE/BANDAGES/DRESSINGS) ×2 IMPLANT
GLOVE BIOGEL PI IND STRL 8 (GLOVE) ×1 IMPLANT
GLOVE BIOGEL PI INDICATOR 8 (GLOVE) ×1
GLOVE ECLIPSE 8.0 STRL XLNG CF (GLOVE) ×2 IMPLANT
GOWN STRL REUS W/ TWL LRG LVL3 (GOWN DISPOSABLE) ×3 IMPLANT
GOWN STRL REUS W/TWL LRG LVL3 (GOWN DISPOSABLE) ×6
KIT BASIN OR (CUSTOM PROCEDURE TRAY) ×2 IMPLANT
KIT TURNOVER KIT B (KITS) ×2 IMPLANT
MANIFOLD NEPTUNE II (INSTRUMENTS) ×2 IMPLANT
NEEDLE HYPO 25X1 1.5 SAFETY (NEEDLE) ×2 IMPLANT
NEEDLE SPNL 18GX3.5 QUINCKE PK (NEEDLE) ×2 IMPLANT
NS IRRIG 1000ML POUR BTL (IV SOLUTION) ×2 IMPLANT
PACK SHOULDER (CUSTOM PROCEDURE TRAY) ×2 IMPLANT
PAD ARMBOARD 7.5X6 YLW CONV (MISCELLANEOUS) ×4 IMPLANT
PORT APPOLLO RF 90DEGREE MULTI (SURGICAL WAND) ×2 IMPLANT
RESTRAINT HEAD UNIVERSAL NS (MISCELLANEOUS) ×2 IMPLANT
SLING ARM FOAM STRAP LRG (SOFTGOODS) IMPLANT
SLING ARM FOAM STRAP XLG (SOFTGOODS) ×2 IMPLANT
SPONGE LAP 4X18 RFD (DISPOSABLE) ×2 IMPLANT
SUCTION FRAZIER HANDLE 10FR (MISCELLANEOUS)
SUCTION TUBE FRAZIER 10FR DISP (MISCELLANEOUS) IMPLANT
SUT ETHILON 3 0 PS 1 (SUTURE) ×4 IMPLANT
SUT VIC AB 2-0 CT1 27 (SUTURE) ×2
SUT VIC AB 2-0 CT1 TAPERPNT 27 (SUTURE) ×1 IMPLANT
SYSTEM IMPL TENODESIS LNT 4.75 (Orthopedic Implant) ×2 IMPLANT
TOWEL GREEN STERILE (TOWEL DISPOSABLE) ×2 IMPLANT
TOWEL GREEN STERILE FF (TOWEL DISPOSABLE) ×2 IMPLANT
TUBING ARTHROSCOPY IRRIG 16FT (MISCELLANEOUS) ×2 IMPLANT
WATER STERILE IRR 1000ML POUR (IV SOLUTION) ×2 IMPLANT

## 2020-04-24 NOTE — Transfer of Care (Signed)
Immediate Anesthesia Transfer of Care Note  Patient: Spencer Good  Procedure(s) Performed: LEFT SHOULDER MANIPULATION UNDER ANESTHESIA, ROTATOR INTERVAL RELEASE, DEBRIDEMENT, BICEPS TENODESIS (Left Shoulder)  Patient Location: PACU  Anesthesia Type:General  Level of Consciousness: awake, alert  and oriented  Airway & Oxygen Therapy: Patient Spontanous Breathing  Post-op Assessment: Report given to RN and Post -op Vital signs reviewed and stable  Post vital signs: Reviewed and stable  Last Vitals:  Vitals Value Taken Time  BP 147/88 04/24/20 1331  Temp 36.2 C 04/24/20 1331  Pulse 114 04/24/20 1332  Resp 28 04/24/20 1332  SpO2 90 % 04/24/20 1332  Vitals shown include unvalidated device data.  Last Pain:  Vitals:   04/24/20 1112  TempSrc:   PainSc: 0-No pain      Patients Stated Pain Goal: 3 (64/84/72 0721)  Complications: No complications documented.

## 2020-04-24 NOTE — H&P (Signed)
Spencer Good is an 44 y.o. male.   Chief Complaint: Left shoulder stiffness HPI: Spencer Good is a 44 year old patient with left shoulder pain.  Been going on for a year.  He is diabetic.  Has significant intra-articular biceps tendon disease by MRI scanning with intact rotator cuff.  Presents now for operative management after failure of conservative management and long duration of symptoms.  Pain does interfere with his sleep as well as ADLs.  Past Medical History:  Diagnosis Date  . ADHD (attention deficit hyperactivity disorder)   . Diabetes ([place])   . Diverticulitis of colon with perforation    s/p sigmoid colectomy  . Gout   . Hernia, umbilical 1761   Laparoscopic umbilical hernia repair with mesh- s/p repair-resolved.  Marland Kitchen History of colon polyps   . Hyperlipidemia   . Hypertension   . Insomnia   . Obesity   . OSA (obstructive sleep apnea) dx 05-2013   Rx a Cpap 3-15, can't use - no longer has machine.  . Sleep apnea    doesn't use CPAP    Past Surgical History:  Procedure Laterality Date  . COLONOSCOPY    . EYE SURGERY    . INSERTION OF MESH N/A 02/01/2015   Procedure: INSERTION OF MESH;  Surgeon: Rolm Bookbinder, MD;  Location: [facility];  Service: General;  Laterality: N/A;  . LAPAROSCOPIC SIGMOID COLECTOMY N/A 05/24/2016   Procedure: LAPAROSCOPIC SIGMOID COLECTOMY;  Surgeon: Excell Seltzer, MD;  Location: [facility];  Service: General;  Laterality: N/A;  . LASIK Bilateral   . UMBILICAL HERNIA REPAIR N/A 02/01/2015   Procedure: LAPAROSCOPIC UMBILICAL HERNIA REPAIR WITH MESH;  Surgeon: Rolm Bookbinder, MD;  Location: [facility];  Service: General;  Laterality: N/A;    Family History  Problem Relation Age of Onset  . Stroke Father        F, PGF  . CAD Other        MGF  . Heart disease Maternal Grandfather   . Colon cancer Neg Hx   . Prostate cancer Neg Hx   . Diabetes Neg Hx        distant fam members  . Colon polyps Neg Hx   . Esophageal cancer Neg Hx   . Gallbladder  disease Neg Hx   . Kidney disease Neg Hx   . Rectal cancer Neg Hx   . Stomach cancer Neg Hx    Social History:  reports that he quit smoking about 5 years ago. His smoking use included cigarettes. He started smoking about 27 years ago. He has a 20.00 pack-year smoking history. He has never used smokeless tobacco. He reports current alcohol use. He reports that he does not use drugs.  Allergies: No Known Allergies  Medications Prior to Admission  Medication Sig Dispense Refill  . aspirin EC 81 MG tablet Take 81 mg by mouth daily.    Marland Kitchen atorvastatin (LIPITOR) 20 MG tablet Take 1 tablet (20 mg total) by mouth at bedtime. 90 tablet 3  . empagliflozin (JARDIANCE) 25 MG TABS tablet Take 1 tablet (25 mg total) by mouth daily before breakfast. 30 tablet 3  . fenofibrate 160 MG tablet Take 1 tablet (160 mg total) by mouth daily. 30 tablet 3  . losartan (COZAAR) 100 MG tablet Take 1 tablet (100 mg total) by mouth daily. 30 tablet 3  . metFORMIN (GLUCOPHAGE) 850 MG tablet Take 1 tablet (850 mg total) by mouth 2 (two) times daily with a meal. 90 tablet 3  . Multiple  Vitamin (MULTIVITAMIN) tablet Take 1 tablet by mouth daily.     . Probiotic Product (PROBIOTIC PO) Take 1 tablet by mouth daily.     . psyllium (METAMUCIL) 58.6 % powder Take 1 packet by mouth 4 (four) times a week.     . Semaglutide, 1 MG/DOSE, (OZEMPIC, 1 MG/DOSE,) 2 MG/1.5ML SOPN Inject 1 mg into the skin once a week. 4 pen 3  . VYVANSE 50 MG capsule Take 50 mg by mouth daily.    . blood glucose meter kit and supplies Dispense based on patient and insurance preference. Check blood sugars three times daily 1 each 0  . Blood Glucose Monitoring Suppl (ONETOUCH VERIO FLEX SYSTEM) w/Device KIT Check blood sugar once daily 1 kit 0  . glucose blood (ONETOUCH VERIO) test strip Check blood sugar once daily 100 each 12  . LORazepam (ATIVAN) 0.5 MG tablet 1-2 tabs 30 - 60 min prior to MRI. Do not drive with this medicine. (Patient not taking:  Reported on 03/24/2020) 4 tablet 0  . ONE TOUCH LANCETS MISC Check blood sugar once daily 100 each 12    Results for orders placed or performed during the hospital encounter of 04/24/20 (from the past 48 hour(s))  Glucose, capillary     Status: Abnormal   Collection Time: 04/24/20 10:11 AM  Result Value Ref Range   Glucose-Capillary 156 (H) 70 - 99 mg/dL    Comment: Glucose reference range applies only to samples taken after fasting for at least 8 hours.   No results found.  Review of Systems  Musculoskeletal: Positive for arthralgias.  All other systems reviewed and are negative.   Blood pressure (!) 137/93, pulse 83, temperature 98.3 F (36.8 C), temperature source Oral, resp. rate 18, height 6' (1.829 m), weight (!) 155.1 kg, SpO2 98 %. Physical Exam Vitals reviewed.  HENT:     Head: Normocephalic.     Nose: Nose normal.     Mouth/Throat:     Mouth: Mucous membranes are moist.  Cardiovascular:     Rate and Rhythm: Normal rate.     Pulses: Normal pulses.  Pulmonary:     Effort: Pulmonary effort is normal.  Abdominal:     General: Abdomen is flat.  Musculoskeletal:     Cervical back: Normal range of motion.  Skin:    General: Skin is warm.     Capillary Refill: Capillary refill takes less than 2 seconds.  Neurological:     General: No focal deficit present.     Mental Status: He is alert.  Psychiatric:        Mood and Affect: Mood normal.   Left shoulder exam demonstrates forward flexion to about 110 versus 180 on the right.  External rotation on the left is 20 degrees passively.  Isolated glenohumeral abduction is about 70 on the left.  Rotator cuff strength is good.  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  Plan is left shoulder manipulation under anesthesia with rotator interval release possible capsular release and intra-articular biceps tenodesis via loop  intact technique.  Risk benefits are discussed with the patient include not limited to recurrent stiffness of the shoulder failure of the biceps tenodesis and continued pain.  Symptoms have been ongoing for a year.  Patient understands risk benefits.  Plan to use CPM machine day of surgery.  All questions answered  Anderson Malta, MD 04/24/2020, 11:04 AM

## 2020-04-24 NOTE — Brief Op Note (Signed)
   04/24/2020  1:26 PM  PATIENT:  Spencer Good  44 y.o. male  PRE-OPERATIVE DIAGNOSIS:  left frozen shoulder, biceps tenodonitis  POST-OPERATIVE DIAGNOSIS:  left frozen shoulder, biceps tenodonitis  PROCEDURE:  Procedure(s): LEFT SHOULDER MANIPULATION UNDER ANESTHESIA, ROTATOR INTERVAL RELEASE, DEBRIDEMENT, BICEPS TENODESIS  SURGEON:  Surgeon(s): Marlou Sa, Tonna Corner, MD  ASSISTANT: magnant pa  ANESTHESIA:   general  EBL: 5 ml    Total I/O In: 64 [IV Piggyback:50] Out: 10 [Blood:10]  BLOOD ADMINISTERED: none  DRAINS: none   LOCAL MEDICATIONS USED:  none  SPECIMEN:  No Specimen  COUNTS:  YES  TOURNIQUET:  * No tourniquets in log *  DICTATION: .Other Dictation: Dictation Number [id number]  PLAN OF CARE: Discharge to home after PACU  PATIENT DISPOSITION:  PACU - hemodynamically stable

## 2020-04-24 NOTE — Anesthesia Procedure Notes (Addendum)
Procedure Name: Intubation Date/Time: 04/24/2020 11:37 AM Performed by: Dorthea Cove, CRNA Pre-anesthesia Checklist: Patient identified, Emergency Drugs available, Suction available and Patient being monitored Patient Re-evaluated:Patient Re-evaluated prior to induction Oxygen Delivery Method: Circle system utilized Preoxygenation: Pre-oxygenation with 100% oxygen Induction Type: IV induction Ventilation: Oral airway inserted - appropriate to patient size and Two handed mask ventilation required Laryngoscope Size: Glidescope and 4 Grade View: Grade I Tube type: Oral Tube size: 7.5 mm Number of attempts: 1 Airway Equipment and Method: Stylet,  Oral airway and Video-laryngoscopy Placement Confirmation: ETT inserted through vocal cords under direct vision,  positive ETCO2 and breath sounds checked- equal and bilateral Secured at: 23 cm Tube secured with: Tape Dental Injury: Teeth and Oropharynx as per pre-operative assessment

## 2020-04-24 NOTE — Anesthesia Postprocedure Evaluation (Signed)
Anesthesia Post Note  Patient: Spencer Good  Procedure(s) Performed: LEFT SHOULDER MANIPULATION UNDER ANESTHESIA, ROTATOR INTERVAL RELEASE, DEBRIDEMENT, BICEPS TENODESIS (Left Shoulder)     Patient location during evaluation: PACU Anesthesia Type: Regional and General Level of consciousness: awake and alert Pain management: pain level controlled Vital Signs Assessment: post-procedure vital signs reviewed and stable Respiratory status: spontaneous breathing, nonlabored ventilation, respiratory function stable and patient connected to nasal cannula oxygen Cardiovascular status: blood pressure returned to baseline and stable Postop Assessment: no apparent nausea or vomiting Anesthetic complications: no   No complications documented.  Last Vitals:  Vitals:   04/24/20 1401 04/24/20 1416  BP: 131/73 112/61  Pulse: 87 89  Resp: 17 20  Temp:  36.5 C  SpO2: 91% 91%    Last Pain:  Vitals:   04/24/20 1401  TempSrc:   PainSc: 0-No pain                 Yaretsi Humphres L Cap Massi

## 2020-04-24 NOTE — Anesthesia Preprocedure Evaluation (Addendum)
Anesthesia Evaluation  Patient identified by MRN, date of birth, ID band Patient awake    Reviewed: Allergy & Precautions, NPO status , Patient's Chart, lab work & pertinent test results  Airway Mallampati: IV  TM Distance: >3 FB Neck ROM: Full    Dental no notable dental hx.    Pulmonary sleep apnea , former smoker,    Pulmonary exam normal breath sounds clear to auscultation       Cardiovascular hypertension, Pt. on medications Normal cardiovascular exam Rhythm:Regular Rate:Normal  ECG: rate 102. Sinus tachycardia Incomplete right bundle branch block   Neuro/Psych PSYCHIATRIC DISORDERS negative neurological ROS     GI/Hepatic negative GI ROS, Neg liver ROS,   Endo/Other  diabetes, Oral Hypoglycemic AgentsMorbid obesity  Renal/GU negative Renal ROS     Musculoskeletal Gout   Abdominal (+) + obese,   Peds  (+) ADHD Hematology HLD   Anesthesia Other Findings left frozen shoulder, biceps tenodonitis  Reproductive/Obstetrics                            Anesthesia Physical Anesthesia Plan  ASA: III  Anesthesia Plan: General and Regional   Post-op Pain Management:    Induction: Intravenous  PONV Risk Score and Plan: 2 and Ondansetron, Dexamethasone, Midazolam and Treatment may vary due to age or medical condition  Airway Management Planned: Oral ETT and Video Laryngoscope Planned  Additional Equipment:   Intra-op Plan:   Post-operative Plan: Extubation in OR  Informed Consent: I have reviewed the patients History and Physical, chart, labs and discussed the procedure including the risks, benefits and alternatives for the proposed anesthesia with the patient or authorized representative who has indicated his/her understanding and acceptance.     Dental advisory given  Plan Discussed with: CRNA  Anesthesia Plan Comments:        Anesthesia Quick Evaluation

## 2020-04-24 NOTE — Anesthesia Procedure Notes (Signed)
Anesthesia Regional Block: Interscalene brachial plexus block   Pre-Anesthetic Checklist: ,, timeout performed, Correct Patient, Correct Site, Correct Laterality, Correct Procedure, Correct Position, site marked, Risks and benefits discussed,  Surgical consent,  Pre-op evaluation,  At surgeon's request and post-op pain management  Laterality: Left  Prep: chloraprep       Needles:  Injection technique: Single-shot  Needle Type: Echogenic Stimulator Needle     Needle Length: 10cm  Needle Gauge: 20     Additional Needles:   Procedures:,,,, ultrasound used (permanent image in chart),,,,  Narrative:  Start time: 04/24/2020 11:00 AM End time: 04/24/2020 11:10 AM Injection made incrementally with aspirations every 5 mL.  Performed by: Personally  Anesthesiologist: Murvin Natal, MD  Additional Notes: Functioning IV was confirmed and monitors were applied.  A timeout was performed. Sterile prep, hand hygiene and sterile gloves were used. A 138mm 20ga BBraun echogenic stimulator needle was used. Negative aspiration and negative test dose prior to incremental administration of local anesthetic. The patient tolerated the procedure well.  Ultrasound guidance: relevent anatomy identified, needle position confirmed, local anesthetic spread visualized around nerve(s), vascular puncture avoided.  Image printed for medical record.

## 2020-04-25 ENCOUNTER — Encounter (HOSPITAL_COMMUNITY): Payer: Self-pay | Admitting: Orthopedic Surgery

## 2020-04-25 NOTE — Op Note (Signed)
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  PROCEDURE:  Left shoulder manipulation under anesthesia with arthroscopy and debridement of the superior labrum with release of the biceps tendon, arthroscopic biceps tenodesis, and rotator interval release.  SURGEON:  Meredith Pel, MD  ASSISTANT:  Annie Main, PA  INDICATIONS:  The patient is a 44 year old patient with 1-year history of left shoulder pain, presents for operative management after explanation of risks and benefits for frozen shoulder as well as severe biceps tendinosis.  PROCEDURE IN DETAIL:  The patient was brought to the operating room where general anesthetic was induced.  Preoperative antibiotics were administered.  Timeout was called.  Left arm was manipulated from about 120 of forward flexion into full forward  flexion.  Also manipulated from 75 degrees of isolated glenohumeral abduction to 120.  Also manipulated from 20 degrees of external rotation to about 75 degrees.  Following manipulation under anesthesia, the patient's head was placed in neutral position  in the beach chair position.  The patient's left shoulder, arm, and hand prescrubbed with alcohol and Betadine, allowed to air dry.  Prepped with DuraPrep solution and draped in sterile manner.  Charlie Pitter was used to seal the operative field.  Posterior  portal created 2 cm medial and inferior to the posterolateral margin of the acromion.  Diagnostic arthroscopy was performed.  Anterior portal was created under direct visualization.  Some hematoma from the manipulation was removed.  Rotator cuff was  intact.   Glenohumeral articular surfaces were intact.  Anterior portal was created under direct visualization.  The biceps tendon was then tenodesed using loop and tack technique to the superior aspect of the bicipital groove.  This was done with a  SwiveLock anchor.  Appropriate tension was maintained.  Superior labrum was then debrided, and the rotator interval was released using the Arthrocare wand.  This gave the patient excellent range of motion.  Biceps tenodesis was stable.  Next, thorough  irrigation of the joint was performed, instruments were removed, and the portals were closed using 2-0 Vicryl, 3-0 nylon.  Shoulder sling was applied.  The patient tolerated the procedure well without immediate complications, transferred to the recovery  room in stable condition.  Luke's assistance was required for arm positioning as well as drilling and anchor placement.  His assistance was medical necessity.  IN/NUANCE  D:04/24/2020 T:04/24/2020 JOB:[id number]

## 2020-05-01 ENCOUNTER — Ambulatory Visit (INDEPENDENT_AMBULATORY_CARE_PROVIDER_SITE_OTHER): Payer: BC Managed Care – PPO | Admitting: Orthopedic Surgery

## 2020-05-01 DIAGNOSIS — M7502 Adhesive capsulitis of left shoulder: Secondary | ICD-10-CM

## 2020-05-03 ENCOUNTER — Encounter: Payer: Self-pay | Admitting: Physical Therapy

## 2020-05-03 ENCOUNTER — Ambulatory Visit (INDEPENDENT_AMBULATORY_CARE_PROVIDER_SITE_OTHER): Payer: BC Managed Care – PPO | Admitting: Physical Therapy

## 2020-05-03 ENCOUNTER — Other Ambulatory Visit: Payer: Self-pay

## 2020-05-03 DIAGNOSIS — M25512 Pain in left shoulder: Secondary | ICD-10-CM | POA: Diagnosis not present

## 2020-05-03 DIAGNOSIS — M6281 Muscle weakness (generalized): Secondary | ICD-10-CM

## 2020-05-03 DIAGNOSIS — R600 Localized edema: Secondary | ICD-10-CM

## 2020-05-03 DIAGNOSIS — M25612 Stiffness of left shoulder, not elsewhere classified: Secondary | ICD-10-CM | POA: Diagnosis not present

## 2020-05-03 DIAGNOSIS — G8929 Other chronic pain: Secondary | ICD-10-CM

## 2020-05-03 NOTE — Patient Instructions (Signed)
Access Code: [email or web address] URL: [email or web address] Date: 05/03/2020 Prepared by: Faustino Congress  Exercises Standing Shoulder Flexion AAROM with Dowel - 2-3 x daily - 7 x weekly - 1 sets - 20 reps - 3-5 hold Standing Shoulder Abduction AAROM with Dowel - 2-3 x daily - 7 x weekly - 1 sets - 20 reps - 3-5 hold Standing Shoulder Internal Rotation AAROM with Dowel - 2-3 x daily - 7 x weekly - 1 sets - 20 reps - 3-5 hold Standing Bilateral Shoulder Internal Rotation AAROM with Dowel - 2-3 x daily - 7 x weekly - 1 sets - 20 reps - 3-5 hold Standing Shoulder External Rotation AAROM with Dowel - 2-3 x daily - 7 x weekly - 1 sets - 20 reps - 3-5 hold

## 2020-05-03 NOTE — Therapy (Signed)
[facility] [address] [place], [place], [address] Phone: [phone]   Fax:  [phone]  Physical Therapy Evaluation  Patient Details  Name: Spencer Good MRN: [id number] Date of Birth: 05-26-1976 Referring Provider (PT): Dr. Marlou Sa   Encounter Date: 05/03/2020   PT End of Session - 05/03/20 1054    Visit Number 8    Number of Visits 24    PT Start Time 0940    PT Stop Time 1012    PT Time Calculation (min) 32 min    Activity Tolerance Patient tolerated treatment well    Behavior During Therapy [facility] for tasks assessed/performed           Past Medical History:  Diagnosis Date  . ADHD (attention deficit hyperactivity disorder)   . Diabetes ([place])   . Diverticulitis of colon with perforation    s/p sigmoid colectomy  . Gout   . Hernia, umbilical 4268   Laparoscopic umbilical hernia repair with mesh- s/p repair-resolved.  Marland Kitchen History of colon polyps   . Hyperlipidemia   . Hypertension   . Insomnia   . Obesity   . OSA (obstructive sleep apnea) dx 05-2013   Rx a Cpap 3-15, can't use - no longer has machine.  . Sleep apnea    doesn't use CPAP    Past Surgical History:  Procedure Laterality Date  . COLONOSCOPY    . EYE SURGERY    . INSERTION OF MESH N/A 02/01/2015   Procedure: INSERTION OF MESH;  Surgeon: Rolm Bookbinder, MD;  Location: [place];  Service: General;  Laterality: N/A;  . LAPAROSCOPIC SIGMOID COLECTOMY N/A 05/24/2016   Procedure: LAPAROSCOPIC SIGMOID COLECTOMY;  Surgeon: Excell Seltzer, MD;  Location: [facility];  Service: General;  Laterality: N/A;  . LASIK Bilateral   . SHOULDER ARTHROSCOPY Left 04/24/2020   Procedure: LEFT SHOULDER MANIPULATION UNDER ANESTHESIA, ROTATOR INTERVAL RELEASE, DEBRIDEMENT, BICEPS TENODESIS;  Surgeon: Meredith Pel, MD;  Location: [place];  Service: Orthopedics;  Laterality: Left;  . UMBILICAL HERNIA REPAIR N/A 02/01/2015   Procedure: LAPAROSCOPIC UMBILICAL HERNIA REPAIR WITH MESH;   Surgeon: Rolm Bookbinder, MD;  Location: [place];  Service: General;  Laterality: N/A;    There were no vitals filed for this visit.    Subjective Assessment - 05/03/20 0945    Subjective Pt returns to OPPT s/p Lt shoulder MAU, debridement and rotator interval debridement and biceps tenodesis on 04/24/20.  Pt presents today with decreased ROM and strength, and expected post op pain.    Pertinent History DM, ADHD, obesity    Patient Stated Goals regain use of Lt shoulder    Currently in Pain? Yes    Pain Score 1    up to 9/10   Pain Location Shoulder    Pain Orientation Left    Pain Descriptors / Indicators Aching    Pain Type Chronic pain    Pain Onset More than a month ago    Pain Frequency Intermittent    Aggravating Factors  moving arm, lifting    Pain Relieving Factors medications              [facility] PT Assessment - 05/03/20 0948      Assessment   Medical Diagnosis biceps tenodesis, debridement, MAU    Referring Provider (PT) Dr. Marlou Sa    Onset Date/Surgical Date 04/24/20    Hand Dominance Right    Next MD Visit 1 month    Prior Therapy at this clinic prior to surgery  Precautions   Precaution Comments no active biceps strengthening      Balance Screen   Has the patient fallen in the past 6 months No    Has the patient had a decrease in activity level because of a fear of falling?  No    Is the patient reluctant to leave their home because of a fear of falling?  No      Home Ecologist residence      Prior Function   Level of Independence Independent    Vocation Full time employment    Vocation Requirements currently out of work - may retrun next 1-2 weeks      Cognition   Overall Cognitive Status Within Functional Limits for tasks assessed      Observation/Other Assessments   Focus on Therapeutic Outcomes (FOTO)  51 (predicted 70)      Posture/Postural Control   Posture/Postural Control Postural limitations    Postural  Limitations Rounded Shoulders;Forward head      ROM / Strength   AROM / PROM / Strength AROM;PROM;Strength      AROM   Right/Left Shoulder Right;Left    Right Shoulder Flexion 170 Degrees    Right Shoulder ABduction 174 Degrees    Right Shoulder Internal Rotation --   T10   Left Shoulder Flexion 129 Degrees    Left Shoulder ABduction 110 Degrees    Left Shoulder Internal Rotation 84 Degrees   supine, seated to L4   Left Shoulder External Rotation 41 Degrees      PROM   Right/Left Shoulder Left    Left Shoulder Flexion 156 Degrees    Left Shoulder ABduction 145 Degrees    Left Shoulder External Rotation 48 Degrees      Strength   Overall Strength Comments deferred due to post-op status, grossly 3-/5                      Objective measurements completed on examination: See above findings.       [facility] Adult PT Treatment/Exercise - 05/03/20 0948      Exercises   Exercises Other Exercises    Other Exercises  see pt instructions - performed 3-5 reps of each exercise, needing min cues to decrease shoulder shrug                  PT Education - 05/03/20 1054    Education Details HEP    Person(s) Educated Patient    Methods Explanation;Demonstration;Handout    Comprehension Verbalized understanding;Returned demonstration;Need further instruction            PT Short Term Goals - 05/03/20 1102      PT SHORT TERM GOAL #1   Title improve AROM Lt shoulder flexion and abduction to at least 140 deg for improved function    Status New    Target Date 05/31/20             PT Long Term Goals - 05/03/20 1103      PT LONG TERM GOAL #1   Title Patient will demonstrate/report pain at worst less than or equal to 2/10 to facilitate minimal limitation in daily activity secondary to pain symptoms.    Time 8    Period Weeks    Status On-going    Target Date 06/28/20      PT LONG TERM GOAL #2   Title Patient will demonstrate independent use of home exercise  program to facilitate  ability to maintain/progress functional gains from skilled physical therapy services.    Time 8    Period Weeks    Status On-going    Target Date 06/28/20      PT LONG TERM GOAL #3   Title Patient will demonstrate return to work/recreational activity at previous level of function without limitations secondary due to condition    Time 8    Period Weeks    Status On-going    Target Date 06/28/20      PT LONG TERM GOAL #4   Title Patient will demonstrate Lt [place] joint mobility WFL to facilitate usual self care, dressing, reaching overhead at PLOF s limitation due to symptoms.    Time 8    Period Weeks    Status On-going    Target Date 06/28/20      PT LONG TERM GOAL #5   Title Patient will demonstrate Lt UE MMT 5/5 throughout to facilitate usual lifting, carrying in functional activity to PLOF s limitation.    Time 8    Period Weeks    Status On-going    Target Date 06/28/20      Additional Long Term Goals   Additional Long Term Goals Yes      PT LONG TERM GOAL #6   Title FOTO score improved to 70 for improved function    Status New    Target Date 06/28/20                  Plan - 05/03/20 1055    Clinical Impression Statement Pt returns to OPPT s/p Lt biceps tenodesis, MAU and debridement on 04/24/20.  He presents today with decreased ROM and strength, as well as expected post-op pain affecting functional mobility.  Pt will benefit from PT to address deficits listed.    Personal Factors and Comorbidities Other   DM, ADHS, obesity   Examination-Activity Limitations Sleep;Carry;Dressing;Hygiene/Grooming;Lift;Reach Overhead    Examination-Participation Restrictions Community Activity;Yard Work    Stability/Clinical Decision Making Stable/Uncomplicated    Clinical Decision Making Low    Rehab Potential Good    PT Frequency 2x / week    PT Duration 8 weeks    PT Treatment/Interventions ADLs/Self Care Home Management;Cryotherapy;Electrical  Stimulation;Iontophoresis 4mg /ml Dexamethasone;Moist Heat;Traction;Balance training;Therapeutic exercise;Therapeutic activities;Functional mobility training;Ultrasound;Neuromuscular re-education;Patient/family education;Manual techniques;Vasopneumatic Device;Taping;Passive range of motion;Spinal Manipulations;Joint Manipulations;Dry needling    PT Next Visit Plan focus on regaining AROM; gentle RTC strengthening, no active biceps strengthening for now    PT Home Exercise Plan Access Code: [email or web address]    Consulted and Agree with Plan of Care Patient           Patient will benefit from skilled therapeutic intervention in order to improve the following deficits and impairments:  Decreased endurance, Hypomobility, Obesity, Pain, Impaired UE functional use, Increased fascial restricitons, Decreased strength, Decreased activity tolerance, Decreased mobility, Increased muscle spasms, Impaired perceived functional ability, Decreased range of motion, Decreased coordination, Impaired flexibility, Increased edema  Visit Diagnosis: Chronic left shoulder pain - Plan: PT plan of care cert/re-cert  Muscle weakness (generalized) - Plan: PT plan of care cert/re-cert  Stiffness of left shoulder, not elsewhere classified - Plan: PT plan of care cert/re-cert  Localized edema - Plan: PT plan of care cert/re-cert     Problem List Patient Active Problem List   Diagnosis Date Noted  . Obstructive sleep apnea 10/05/2019  . Dyslipidemia 07/09/2019  . Annual physical exam 02/02/2019  . Diverticulitis of sigmoid colon 05/24/2016  . Morbid obesity ([place]) 04/25/2016  .  ADD (attention deficit disorder) 12/28/2015  . Diverticulitis of large intestine with abscess without bleeding 09/13/2015  . PCP NOTES >>>>>>>>>>>>>>>>>>>> 09/07/2015  . Elevated WBCs 01/27/2014  . Diabetes ([place]) 02/17/2013  . Tachycardia 08/29/2011  . TOBACCO USER 06/29/2010      Laureen Abrahams, PT, DPT 05/03/20 11:10 AM    [facility] [address] [place], [place], [address] Phone: [phone]   Fax:  [phone]  Name: Spencer Good MRN: [id number] Date of Birth: 1976/06/06

## 2020-05-05 ENCOUNTER — Encounter: Payer: Self-pay | Admitting: Physical Therapy

## 2020-05-05 ENCOUNTER — Other Ambulatory Visit: Payer: Self-pay

## 2020-05-05 ENCOUNTER — Ambulatory Visit (INDEPENDENT_AMBULATORY_CARE_PROVIDER_SITE_OTHER): Payer: BC Managed Care – PPO | Admitting: Physical Therapy

## 2020-05-05 DIAGNOSIS — G8929 Other chronic pain: Secondary | ICD-10-CM

## 2020-05-05 DIAGNOSIS — M25512 Pain in left shoulder: Secondary | ICD-10-CM | POA: Diagnosis not present

## 2020-05-05 DIAGNOSIS — R600 Localized edema: Secondary | ICD-10-CM

## 2020-05-05 DIAGNOSIS — M6281 Muscle weakness (generalized): Secondary | ICD-10-CM

## 2020-05-05 DIAGNOSIS — M25612 Stiffness of left shoulder, not elsewhere classified: Secondary | ICD-10-CM | POA: Diagnosis not present

## 2020-05-05 NOTE — Therapy (Signed)
[facility] [address] [place], [place], [address] Phone: [phone]   Fax:  [phone]  Physical Therapy Treatment  Patient Details  Name: Spencer Good MRN: [id number] Date of Birth: 21-Nov-1975 Referring Provider (PT): Dr. Marlou Sa   Encounter Date: 05/05/2020   PT End of Session - 05/05/20 1004    Visit Number 2    Number of Visits 24    PT Start Time 8250    PT Stop Time 1004    PT Time Calculation (min) 39 min    Activity Tolerance Patient tolerated treatment well    Behavior During Therapy [facility] for tasks assessed/performed           Past Medical History:  Diagnosis Date  . ADHD (attention deficit hyperactivity disorder)   . Diabetes ([place])   . Diverticulitis of colon with perforation    s/p sigmoid colectomy  . Gout   . Hernia, umbilical 5397   Laparoscopic umbilical hernia repair with mesh- s/p repair-resolved.  Marland Kitchen History of colon polyps   . Hyperlipidemia   . Hypertension   . Insomnia   . Obesity   . OSA (obstructive sleep apnea) dx 05-2013   Rx a Cpap 3-15, can't use - no longer has machine.  . Sleep apnea    doesn't use CPAP    Past Surgical History:  Procedure Laterality Date  . COLONOSCOPY    . EYE SURGERY    . INSERTION OF MESH N/A 02/01/2015   Procedure: INSERTION OF MESH;  Surgeon: Rolm Bookbinder, MD;  Location: [place];  Service: General;  Laterality: N/A;  . LAPAROSCOPIC SIGMOID COLECTOMY N/A 05/24/2016   Procedure: LAPAROSCOPIC SIGMOID COLECTOMY;  Surgeon: Excell Seltzer, MD;  [facility];  Service: General;  Laterality: N/A;  . LASIK Bilateral   . SHOULDER ARTHROSCOPY Left 04/24/2020   Procedure: LEFT SHOULDER MANIPULATION UNDER ANESTHESIA, ROTATOR INTERVAL RELEASE, DEBRIDEMENT, BICEPS TENODESIS;  Surgeon: Meredith Pel, MD;  Location: [place];  Service: Orthopedics;  Laterality: Left;  . UMBILICAL HERNIA REPAIR N/A 02/01/2015   Procedure: LAPAROSCOPIC UMBILICAL HERNIA REPAIR WITH MESH;   Surgeon: Rolm Bookbinder, MD;  Location: [place];  Service: General;  Laterality: N/A;    There were no vitals filed for this visit.   Subjective Assessment - 05/05/20 0928    Subjective got his COVID booster yesterday in Rt arm so that arm is sore today.    Pertinent History DM, ADHD, obesity    Patient Stated Goals regain use of Lt shoulder    Currently in Pain? Yes    Pain Score 3     Pain Location Shoulder    Pain Orientation Left    Pain Descriptors / Indicators Aching    Pain Type Chronic pain    Pain Onset More than a month ago    Pain Frequency Intermittent    Aggravating Factors  moving arm, lifting    Pain Relieving Factors meds                             [facility] Adult PT Treatment/Exercise - 05/05/20 0931      Exercises   Exercises Shoulder      Shoulder Exercises: Standing   External Rotation Left;20 reps;AAROM   2# bar   Internal Rotation AAROM;20 reps   2# bar   Flexion Both;20 reps;AAROM   2# bar   ABduction Left;AAROM;20 reps   2# bar   Row Both;20 reps;Theraband  Theraband Level (Shoulder Row) Level 4 (Blue)    Other Standing Exercises wall ladder flexion and scaption x 20 reps      Shoulder Exercises: Pulleys   Flexion 3 minutes    Scaption 3 minutes      Shoulder Exercises: Stretch   Other Shoulder Stretches ball roll forward on mat table 20 x 5 sec hold                    PT Short Term Goals - 05/03/20 1102      PT SHORT TERM GOAL #1   Title improve AROM Lt shoulder flexion and abduction to at least 140 deg for improved function    Status New    Target Date 05/31/20             PT Long Term Goals - 05/03/20 1103      PT LONG TERM GOAL #1   Title Patient will demonstrate/report pain at worst less than or equal to 2/10 to facilitate minimal limitation in daily activity secondary to pain symptoms.    Time 8    Period Weeks    Status On-going    Target Date 06/28/20      PT LONG TERM GOAL #2   Title  Patient will demonstrate independent use of home exercise program to facilitate ability to maintain/progress functional gains from skilled physical therapy services.    Time 8    Period Weeks    Status On-going    Target Date 06/28/20      PT LONG TERM GOAL #3   Title Patient will demonstrate return to work/recreational activity at previous level of function without limitations secondary due to condition    Time 8    Period Weeks    Status On-going    Target Date 06/28/20      PT LONG TERM GOAL #4   Title Patient will demonstrate Lt [place] joint mobility WFL to facilitate usual self care, dressing, reaching overhead at PLOF s limitation due to symptoms.    Time 8    Period Weeks    Status On-going    Target Date 06/28/20      PT LONG TERM GOAL #5   Title Patient will demonstrate Lt UE MMT 5/5 throughout to facilitate usual lifting, carrying in functional activity to PLOF s limitation.    Time 8    Period Weeks    Status On-going    Target Date 06/28/20      Additional Long Term Goals   Additional Long Term Goals Yes      PT LONG TERM GOAL #6   Title FOTO score improved to 70 for improved function    Status New    Target Date 06/28/20                 Plan - 05/05/20 1005    Clinical Impression Statement Pt tolerated session well today without increase in pain or soreness.  Overall progressing as expected.  No goals met as only 2nd visit.    Personal Factors and Comorbidities Other   DM, ADHS, obesity   Examination-Activity Limitations Sleep;Carry;Dressing;Hygiene/Grooming;Lift;Reach Overhead    Examination-Participation Restrictions Community Activity;Yard Work    Stability/Clinical Decision Making Stable/Uncomplicated    Rehab Potential Good    PT Frequency 2x / week    PT Duration 8 weeks    PT Treatment/Interventions ADLs/Self Care Home Management;Cryotherapy;Electrical Stimulation;Iontophoresis 4m/ml Dexamethasone;Moist Heat;Traction;Balance training;Therapeutic  exercise;Therapeutic activities;Functional mobility training;Ultrasound;Neuromuscular re-education;Patient/family education;Manual techniques;Vasopneumatic Device;Taping;Passive  range of motion;Spinal Manipulations;Joint Manipulations;Dry needling    PT Next Visit Plan focus on regaining AROM; gentle RTC strengthening, no active biceps strengthening for now    PT Home Exercise Plan Access Code: [email or web address]    Consulted and Agree with Plan of Care Patient           Patient will benefit from skilled therapeutic intervention in order to improve the following deficits and impairments:  Decreased endurance, Hypomobility, Obesity, Pain, Impaired UE functional use, Increased fascial restricitons, Decreased strength, Decreased activity tolerance, Decreased mobility, Increased muscle spasms, Impaired perceived functional ability, Decreased range of motion, Decreased coordination, Impaired flexibility, Increased edema  Visit Diagnosis: Chronic left shoulder pain  Muscle weakness (generalized)  Stiffness of left shoulder, not elsewhere classified  Localized edema     Problem List Patient Active Problem List   Diagnosis Date Noted  . Obstructive sleep apnea 10/05/2019  . Dyslipidemia 07/09/2019  . Annual physical exam 02/02/2019  . Diverticulitis of sigmoid colon 05/24/2016  . Morbid obesity ([place]) 04/25/2016  . ADD (attention deficit disorder) 12/28/2015  . Diverticulitis of large intestine with abscess without bleeding 09/13/2015  . PCP NOTES >>>>>>>>>>>>>>>>>>>> 09/07/2015  . Elevated WBCs 01/27/2014  . Diabetes ([place]) 02/17/2013  . Tachycardia 08/29/2011  . TOBACCO USER 06/29/2010      Laureen Abrahams, PT, DPT 05/05/20 10:07 AM    [facility] Physical Therapy [address] [place], [place], [address] Phone: [phone]   Fax:  [phone]  Name: Spencer Good MRN: [id number] Date of Birth: August 08, 1975

## 2020-05-06 ENCOUNTER — Encounter: Payer: Self-pay | Admitting: Orthopedic Surgery

## 2020-05-06 NOTE — Progress Notes (Signed)
Post-Op Visit Note   Patient: Spencer Good           Date of Birth: [birth year]           MRN: [id number] Visit Date: 05/01/2020 PCP: Colon Branch, MD   Assessment & Plan:  Chief Complaint:  Chief Complaint  Patient presents with  . Left Shoulder - Routine Post Op   Visit Diagnoses:  1. Adhesive capsulitis of left shoulder     Plan: Spencer Good is a 44 year old patient with left shoulder pain who underwent biceps tenodesis and manipulation under anesthesia 2020-04-24.  He has been doing reasonably well.  On exam his shoulder motion gains have been maintained.  He is at 90 degrees on the CPM.  Plan is to start physical therapy here 2 times a week for 4 weeks with no active biceps strengthening and to focus on manual PT for stretching and maintaining his range of motion gains.  Come back in 4 weeks for clinical recheck.  Okay to discontinue sling use but I do not want him doing any lifting with that left arm at this time.  Follow-Up Instructions: No follow-ups on file.   Orders:  Orders Placed This Encounter  Procedures  . Ambulatory referral to Physical Therapy   No orders of the defined types were placed in this encounter.   Imaging: No results found.  PMFS History: Patient Active Problem List   Diagnosis Date Noted  . Obstructive sleep apnea 10/05/2019  . Dyslipidemia 07/09/2019  . Annual physical exam 02/02/2019  . Diverticulitis of sigmoid colon 05/24/2016  . Morbid obesity ([place]) 04/25/2016  . ADD (attention deficit disorder) 12/28/2015  . Diverticulitis of large intestine with abscess without bleeding 09/13/2015  . PCP NOTES >>>>>>>>>>>>>>>>>>>> 09/07/2015  . Elevated WBCs 01/27/2014  . Diabetes ([place]) 02/17/2013  . Tachycardia 08/29/2011  . TOBACCO USER 06/29/2010   Past Medical History:  Diagnosis Date  . ADHD (attention deficit hyperactivity disorder)   . Diabetes ([place])   . Diverticulitis of colon with perforation    s/p sigmoid colectomy  . Gout   . Hernia,  umbilical 2202   Laparoscopic umbilical hernia repair with mesh- s/p repair-resolved.  Marland Kitchen History of colon polyps   . Hyperlipidemia   . Hypertension   . Insomnia   . Obesity   . OSA (obstructive sleep apnea) dx 05-2013   Rx a Cpap 3-15, can't use - no longer has machine.  . Sleep apnea    doesn't use CPAP    Family History  Problem Relation Age of Onset  . Stroke Father        F, PGF  . CAD Other        MGF  . Heart disease Maternal Grandfather   . Colon cancer Neg Hx   . Prostate cancer Neg Hx   . Diabetes Neg Hx        distant fam members  . Colon polyps Neg Hx   . Esophageal cancer Neg Hx   . Gallbladder disease Neg Hx   . Kidney disease Neg Hx   . Rectal cancer Neg Hx   . Stomach cancer Neg Hx     Past Surgical History:  Procedure Laterality Date  . COLONOSCOPY    . EYE SURGERY    . INSERTION OF MESH N/A 02/01/2015   Procedure: INSERTION OF MESH;  Surgeon: Rolm Bookbinder, MD;  Location: [place];  Service: General;  Laterality: N/A;  . LAPAROSCOPIC SIGMOID COLECTOMY N/A 05/24/2016  Procedure: LAPAROSCOPIC SIGMOID COLECTOMY;  Surgeon: Excell Seltzer, MD;  [facility];  Service: General;  Laterality: N/A;  . LASIK Bilateral   . SHOULDER ARTHROSCOPY Left 04/24/2020   Procedure: LEFT SHOULDER MANIPULATION UNDER ANESTHESIA, ROTATOR INTERVAL RELEASE, DEBRIDEMENT, BICEPS TENODESIS;  Surgeon: Meredith Pel, MD;  Location: [place];  Service: Orthopedics;  Laterality: Left;  . UMBILICAL HERNIA REPAIR N/A 02/01/2015   Procedure: LAPAROSCOPIC UMBILICAL HERNIA REPAIR WITH MESH;  Surgeon: Rolm Bookbinder, MD;  Location: [place];  Service: General;  Laterality: N/A;   Social History   Occupational History  . Occupation: works at a [employer]  . Smoking status: Former Smoker    Packs/day: 1.00    Years: 20.00    Pack years: 20.00    Types: Cigarettes    Start date: 07/30/1992    Quit date: 12/16/2014    Years since quitting: 5.3  . Smokeless tobacco: Never  Used  . Tobacco comment: + vaping , no cigarrets   Vaping Use  . Vaping Use: Every day  Substance and Sexual Activity  . Alcohol use: Yes    Alcohol/week: 0.0 standard drinks    Comment: 09/08/2015 "I'll have a few drinks/year"  . Drug use: No    Comment: Past  experimented some in college; not a regular user when I did use-none over 15 yrs  . Sexual activity: Not Currently

## 2020-05-08 ENCOUNTER — Other Ambulatory Visit: Payer: Self-pay

## 2020-05-08 ENCOUNTER — Encounter: Payer: Self-pay | Admitting: Rehabilitative and Restorative Service Providers"

## 2020-05-08 ENCOUNTER — Ambulatory Visit (INDEPENDENT_AMBULATORY_CARE_PROVIDER_SITE_OTHER): Payer: BC Managed Care – PPO | Admitting: Rehabilitative and Restorative Service Providers"

## 2020-05-08 DIAGNOSIS — M25512 Pain in left shoulder: Secondary | ICD-10-CM

## 2020-05-08 DIAGNOSIS — G8929 Other chronic pain: Secondary | ICD-10-CM

## 2020-05-08 DIAGNOSIS — M6281 Muscle weakness (generalized): Secondary | ICD-10-CM

## 2020-05-08 DIAGNOSIS — M25612 Stiffness of left shoulder, not elsewhere classified: Secondary | ICD-10-CM

## 2020-05-08 NOTE — Therapy (Signed)
[facility] Physical Therapy [address] [place], [place], [address] Phone: [phone]   Fax:  [phone]  Physical Therapy Treatment  Patient Details  Name: Spencer Good MRN: [id number] Date of Birth: 1975/11/04 Referring Provider (PT): Dr. Marlou Sa   Encounter Date: 05/08/2020   PT End of Session - 05/08/20 1553    Visit Number 3    Number of Visits 24    Progress Note Due on Visit 10    PT Start Time 1350    PT Stop Time 1430    PT Time Calculation (min) 40 min    Activity Tolerance Patient tolerated treatment well    Behavior During Therapy [facility] for tasks assessed/performed           Past Medical History:  Diagnosis Date  . ADHD (attention deficit hyperactivity disorder)   . Diabetes ([place])   . Diverticulitis of colon with perforation    s/p sigmoid colectomy  . Gout   . Hernia, umbilical 1740   Laparoscopic umbilical hernia repair with mesh- s/p repair-resolved.  Marland Kitchen History of colon polyps   . Hyperlipidemia   . Hypertension   . Insomnia   . Obesity   . OSA (obstructive sleep apnea) dx 05-2013   Rx a Cpap 3-15, can't use - no longer has machine.  . Sleep apnea    doesn't use CPAP    Past Surgical History:  Procedure Laterality Date  . COLONOSCOPY    . EYE SURGERY    . INSERTION OF MESH N/A 02/01/2015   Procedure: INSERTION OF MESH;  Surgeon: Rolm Bookbinder, MD;  Location: [place];  Service: General;  Laterality: N/A;  . LAPAROSCOPIC SIGMOID COLECTOMY N/A 05/24/2016   Procedure: LAPAROSCOPIC SIGMOID COLECTOMY;  Surgeon: Excell Seltzer, MD;  [facility];  Service: General;  Laterality: N/A;  . LASIK Bilateral   . SHOULDER ARTHROSCOPY Left 04/24/2020   Procedure: LEFT SHOULDER MANIPULATION UNDER ANESTHESIA, ROTATOR INTERVAL RELEASE, DEBRIDEMENT, BICEPS TENODESIS;  Surgeon: Meredith Pel, MD;  Location: [place];  Service: Orthopedics;  Laterality: Left;  . UMBILICAL HERNIA REPAIR N/A 02/01/2015   Procedure: LAPAROSCOPIC  UMBILICAL HERNIA REPAIR WITH MESH;  Surgeon: Rolm Bookbinder, MD;  Location: [place];  Service: General;  Laterality: N/A;    There were no vitals filed for this visit.   Subjective Assessment - 05/08/20 1551    Subjective Spencer Good notes soreness with returning to work today.    Pertinent History DM, ADHD, obesity    Patient Stated Goals regain use of Lt shoulder    Currently in Pain? Yes    Pain Score 3     Pain Location Shoulder    Pain Orientation Left    Pain Descriptors / Indicators Aching;Sore    Pain Type Chronic pain    Pain Onset More than a month ago    Pain Frequency Intermittent    Pain Relieving Factors L shoulder use    Effect of Pain on Daily Activities Limited L shoulder use    Multiple Pain Sites No              [facility] PT Assessment - 05/08/20 0001      AROM   Left Shoulder Flexion 140 Degrees    Left Shoulder Internal Rotation 30 Degrees    Left Shoulder External Rotation 60 Degrees    Left Shoulder Horizontal ADduction 20 Degrees      PROM   Right/Left Shoulder Left  [facility] Adult PT Treatment/Exercise - 05/08/20 0001      Exercises   Exercises Shoulder      Shoulder Exercises: Supine   Protraction Strengthening;Left;20 reps   3 seconds   Internal Rotation AROM;Left;20 reps   10 seconds     Shoulder Exercises: Seated   Retraction Strengthening;Both;10 reps   5 seconds     Shoulder Exercises: Standing   External Rotation AAROM;Left;20 reps   3 seconds 1# bar   Internal Rotation AAROM;Left;10 reps   10 seconds 2 exercises 1#   Flexion AAROM;Both;20 reps   1# bar   ABduction AAROM;Left;20 reps   1# bar                 PT Education - 05/08/20 1552    Education Details Reviewed and progressed HEP (scapular strengthening and IR AROM)    Person(s) Educated Patient    Methods Explanation;Demonstration;Tactile cues;Handout    Comprehension Verbal cues required;Returned demonstration;Need further  instruction;Tactile cues required;Verbalized understanding            PT Short Term Goals - 05/08/20 1552      PT SHORT TERM GOAL #1   Title improve AROM Lt shoulder flexion and abduction to at least 140 deg for improved function    Status Partially Met    Target Date 05/31/20             PT Long Term Goals - 05/08/20 1553      PT LONG TERM GOAL #1   Title Patient will demonstrate/report pain at worst less than or equal to 2/10 to facilitate minimal limitation in daily activity secondary to pain symptoms.    Time 8    Period Weeks    Status On-going      PT LONG TERM GOAL #2   Title Patient will demonstrate independent use of home exercise program to facilitate ability to maintain/progress functional gains from skilled physical therapy services.    Time 8    Period Weeks    Status On-going      PT LONG TERM GOAL #3   Title Patient will demonstrate return to work/recreational activity at previous level of function without limitations secondary due to condition    Time 8    Period Weeks    Status On-going      PT LONG TERM GOAL #4   Title Patient will demonstrate Lt [place] joint mobility WFL to facilitate usual self care, dressing, reaching overhead at PLOF s limitation due to symptoms.    Time 8    Period Weeks    Status On-going      PT LONG TERM GOAL #5   Title Patient will demonstrate Lt UE MMT 5/5 throughout to facilitate usual lifting, carrying in functional activity to PLOF s limitation.    Time 8    Period Weeks    Status On-going      PT LONG TERM GOAL #6   Title FOTO score improved to 70 for improved function    Status On-going                 Plan - 05/08/20 1553    Clinical Impression Statement Spencer Good is making early objective progress towards long-term goals established at evaluation.  AROM is the emphasis with light resistance with scapular strength as well.  Continue current plan to address impairments noted at evaluation.    Personal Factors and  Comorbidities Other   DM, ADHS, obesity   Examination-Activity Limitations Sleep;Carry;Dressing;Hygiene/Grooming;Lift;Reach Overhead  Examination-Participation Restrictions Community Activity;Yard Work    Stability/Clinical Decision Making Stable/Uncomplicated    Rehab Potential Good    PT Frequency 2x / week    PT Duration 8 weeks    PT Treatment/Interventions ADLs/Self Care Home Management;Cryotherapy;Electrical Stimulation;Iontophoresis 18m/ml Dexamethasone;Moist Heat;Traction;Balance training;Therapeutic exercise;Therapeutic activities;Functional mobility training;Ultrasound;Neuromuscular re-education;Patient/family education;Manual techniques;Vasopneumatic Device;Taping;Passive range of motion;Spinal Manipulations;Joint Manipulations;Dry needling    PT Next Visit Plan focus on regaining AROM; gentle RTC strengthening, no active biceps strengthening for now    PT Home Exercise Plan Access Code: [email or web address]    Consulted and Agree with Plan of Care Patient           Patient will benefit from skilled therapeutic intervention in order to improve the following deficits and impairments:  Decreased endurance, Hypomobility, Obesity, Pain, Impaired UE functional use, Increased fascial restricitons, Decreased strength, Decreased activity tolerance, Decreased mobility, Increased muscle spasms, Impaired perceived functional ability, Decreased range of motion, Decreased coordination, Impaired flexibility, Increased edema  Visit Diagnosis: Muscle weakness (generalized)  Stiffness of left shoulder, not elsewhere classified  Chronic left shoulder pain     Problem List Patient Active Problem List   Diagnosis Date Noted  . Obstructive sleep apnea 10/05/2019  . Dyslipidemia 07/09/2019  . Annual physical exam 02/02/2019  . Diverticulitis of sigmoid colon 05/24/2016  . Morbid obesity ([place] 04/25/2016  . ADD (attention deficit disorder) 12/28/2015  . Diverticulitis of large intestine with abscess  without bleeding 09/13/2015  . PCP NOTES >>>>>>>>>>>>>>>>>>>> 09/07/2015  . Elevated WBCs 01/27/2014  . Diabetes ([place] 02/17/2013  . Tachycardia 08/29/2011  . TOBACCO USER 06/29/2010    RFarley LyPT, MPT 05/08/2020, 3:56 PM  [facility] [address] [place] [address]Phone: [phone]  Fax:  [phone] Name: TJADAVION SPOELSTRAMRN: [id number] of Birth: [birth year]

## 2020-05-10 ENCOUNTER — Telehealth: Payer: Self-pay | Admitting: Rehabilitative and Restorative Service Providers"

## 2020-05-10 ENCOUNTER — Encounter: Payer: BC Managed Care – PPO | Admitting: Rehabilitative and Restorative Service Providers"

## 2020-05-10 NOTE — Telephone Encounter (Signed)
Called Pt. About no show for today's appointment.  Pt. Indicated he forgot and was tied up with something.  Reminded about next appointment time.  Scot Jun, PT, DPT, OCS, ATC 05/10/20  11:18 AM

## 2020-05-14 DIAGNOSIS — G4733 Obstructive sleep apnea (adult) (pediatric): Secondary | ICD-10-CM | POA: Diagnosis not present

## 2020-05-15 ENCOUNTER — Other Ambulatory Visit: Payer: Self-pay | Admitting: Internal Medicine

## 2020-05-15 ENCOUNTER — Encounter: Payer: Self-pay | Admitting: Internal Medicine

## 2020-05-15 MED ORDER — METFORMIN HCL 850 MG PO TABS
850.0000 mg | ORAL_TABLET | Freq: Three times a day (TID) | ORAL | 3 refills | Status: DC
Start: 2020-05-15 — End: 2020-07-06

## 2020-05-17 ENCOUNTER — Encounter: Payer: Self-pay | Admitting: Rehabilitative and Restorative Service Providers"

## 2020-05-17 ENCOUNTER — Other Ambulatory Visit: Payer: Self-pay

## 2020-05-17 ENCOUNTER — Ambulatory Visit (INDEPENDENT_AMBULATORY_CARE_PROVIDER_SITE_OTHER): Payer: BC Managed Care – PPO | Admitting: Rehabilitative and Restorative Service Providers"

## 2020-05-17 DIAGNOSIS — M25612 Stiffness of left shoulder, not elsewhere classified: Secondary | ICD-10-CM

## 2020-05-17 DIAGNOSIS — R600 Localized edema: Secondary | ICD-10-CM | POA: Diagnosis not present

## 2020-05-17 DIAGNOSIS — M25512 Pain in left shoulder: Secondary | ICD-10-CM

## 2020-05-17 DIAGNOSIS — M6281 Muscle weakness (generalized): Secondary | ICD-10-CM | POA: Diagnosis not present

## 2020-05-17 DIAGNOSIS — G8929 Other chronic pain: Secondary | ICD-10-CM

## 2020-05-17 NOTE — Patient Instructions (Signed)
Access Code: [email or web address] URL: [email or web address] Date: 05/17/2020 Prepared by: Scot Jun  Exercises Standing Shoulder Flexion AAROM with [place] - 2-3 x daily - 7 x weekly - 1 sets - 20 reps - 3-5 hold Standing Bilateral Shoulder Internal Rotation AAROM with [place] - 2-3 x daily - 7 x weekly - 1 sets - 10 reps - 10 seconds hold Standing Shoulder External Rotation AAROM with [place] - 2-3 x daily - 7 x weekly - 1 sets - 20 reps - 3-5 hold Standing Scapular Retraction - 5 x daily - 7 x weekly - 1 sets - 5 reps - 5 second hold Supine Scapular Protraction in Flexion with Dumbbells - 2-3 x daily - 7 x weekly - 1 sets - 20 reps - 3 seconds hold Supine Shoulder Internal Rotation - 2-3 x daily - 7 x weekly - 1 sets - 10 reps - 10 seconds hold Sidelying Shoulder Abduction Palm Forward - 1 x daily - 7 x weekly - 3 sets - 15 reps Sidelying Shoulder Flexion 15 Degrees - 1 x daily - 7 x weekly - 3 sets - 15 reps

## 2020-05-17 NOTE — Therapy (Signed)
[facility] [place], [place], [address] Phone: [phone]   Fax:  [phone]  Physical Therapy Treatment  Patient Details  Name: Spencer Good MRN: [id number] Date of Birth: Sep 11, 1975 Referring Provider (PT): Dr. Marlou Sa   Encounter Date: 05/17/2020   PT End of Session - 05/17/20 1308    Visit Number 4    Number of Visits 24    Progress Note Due on Visit 10    PT Start Time 1300    PT Stop Time 1340    PT Time Calculation (min) 40 min    Activity Tolerance Patient tolerated treatment well    Behavior During Therapy [facility] for tasks assessed/performed           Past Medical History:  Diagnosis Date  . ADHD (attention deficit hyperactivity disorder)   . Diabetes ([place])   . Diverticulitis of colon with perforation    s/p sigmoid colectomy  . Gout   . Hernia, umbilical 1941   Laparoscopic umbilical hernia repair with mesh- s/p repair-resolved.  Marland Kitchen History of colon polyps   . Hyperlipidemia   . Hypertension   . Insomnia   . Obesity   . OSA (obstructive sleep apnea) dx 05-2013   Rx a Cpap 3-15, can't use - no longer has machine.  . Sleep apnea    doesn't use CPAP    Past Surgical History:  Procedure Laterality Date  . COLONOSCOPY    . EYE SURGERY    . INSERTION OF MESH N/A 02/01/2015   Procedure: INSERTION OF MESH;  Surgeon: Rolm Bookbinder, MD;  Location: [place];  Service: General;  Laterality: N/A;  . LAPAROSCOPIC SIGMOID COLECTOMY N/A 05/24/2016   Procedure: LAPAROSCOPIC SIGMOID COLECTOMY;  Surgeon: Excell Seltzer, MD;  [facility];  Service: General;  Laterality: N/A;  . LASIK Bilateral   . SHOULDER ARTHROSCOPY Left 04/24/2020   Procedure: LEFT SHOULDER MANIPULATION UNDER ANESTHESIA, ROTATOR INTERVAL RELEASE, DEBRIDEMENT, BICEPS TENODESIS;  Surgeon: Meredith Pel, MD;  Location: [place];  Service: Orthopedics;  Laterality: Left;  . UMBILICAL HERNIA REPAIR N/A 02/01/2015   Procedure: LAPAROSCOPIC UMBILICAL  HERNIA REPAIR WITH MESH;  Surgeon: Rolm Bookbinder, MD;  Location: [place];  Service: General;  Laterality: N/A;    There were no vitals filed for this visit.   Subjective Assessment - 05/17/20 1304    Subjective Pt. indicated soreness complaints overall, generalized.  Noted c prolonged work activity.  Mentioned that HEP helps mobility later as well as home TENS unit helping.    Pertinent History DM, ADHD, obesity    Patient Stated Goals regain use of Lt shoulder    Currently in Pain? No/denies   generalized soreness   Pain Score --   mild   Pain Location Shoulder    Pain Orientation Left    Pain Descriptors / Indicators Sore    Pain Onset More than a month ago    Pain Frequency Intermittent    Aggravating Factors  prolonged working, insidious at times    Pain Relieving Factors home TENS unit, HEP                             [facility] Adult PT Treatment/Exercise - 05/17/20 0001      Neuro Re-ed    Neuro Re-ed Details  rhythmic stabilizations in 100 deg flexion 30 second bouts      Shoulder Exercises: Supine   Other Supine Exercises wand flexion 20x  2 lbs      Shoulder Exercises: Seated   Other Seated Exercises blue band lat pull down 3 x 15      Shoulder Exercises: Sidelying   ABduction 15 reps;Left      Shoulder Exercises: Standing   Row 20 reps;Both    Theraband Level (Shoulder Row) Level 4 (Blue)    Other Standing Exercises high row blue band 20x       Shoulder Exercises: ROM/Strengthening   UBE (Upper Arm Bike) Lvl 3 2 mins fwd/back each      Manual Therapy   Manual therapy comments g3-g4 inferior jt mobs in mid to end range flexion, scaption, MWM c ER in 75 deg                     PT Short Term Goals - 05/08/20 1552      PT SHORT TERM GOAL #1   Title improve AROM Lt shoulder flexion and abduction to at least 140 deg for improved function    Status Partially Met    Target Date 05/31/20             PT Long Term Goals - 05/08/20  1553      PT LONG TERM GOAL #1   Title Patient will demonstrate/report pain at worst less than or equal to 2/10 to facilitate minimal limitation in daily activity secondary to pain symptoms.    Time 8    Period Weeks    Status On-going      PT LONG TERM GOAL #2   Title Patient will demonstrate independent use of home exercise program to facilitate ability to maintain/progress functional gains from skilled physical therapy services.    Time 8    Period Weeks    Status On-going      PT LONG TERM GOAL #3   Title Patient will demonstrate return to work/recreational activity at previous level of function without limitations secondary due to condition    Time 8    Period Weeks    Status On-going      PT LONG TERM GOAL #4   Title Patient will demonstrate Lt [place] joint mobility WFL to facilitate usual self care, dressing, reaching overhead at PLOF s limitation due to symptoms.    Time 8    Period Weeks    Status On-going      PT LONG TERM GOAL #5   Title Patient will demonstrate Lt UE MMT 5/5 throughout to facilitate usual lifting, carrying in functional activity to PLOF s limitation.    Time 8    Period Weeks    Status On-going      PT LONG TERM GOAL #6   Title FOTO score improved to 70 for improved function    Status On-going                 Plan - 05/17/20 1334    Clinical Impression Statement Muscle restriction, pain and mild capsular restriction noted in elevation and rotation attempts for Lt GH jt at this time.  Elevation against gravity to 90 deg prior to shrug today.  Continued skilled PT warranted.    Personal Factors and Comorbidities Other   DM, ADHS, obesity   Examination-Activity Limitations Sleep;Carry;Dressing;Hygiene/Grooming;Lift;Reach Overhead    Examination-Participation Restrictions Community Activity;Yard Work    Stability/Clinical Decision Making Stable/Uncomplicated    Rehab Potential Good    PT Frequency 2x / week    PT Duration 8 weeks    PT  Treatment/Interventions ADLs/Self Care Home Management;Cryotherapy;Electrical Stimulation;Iontophoresis 97m/ml Dexamethasone;Moist Heat;Traction;Balance training;Therapeutic exercise;Therapeutic activities;Functional mobility training;Ultrasound;Neuromuscular re-education;Patient/family education;Manual techniques;Vasopneumatic Device;Taping;Passive range of motion;Spinal Manipulations;Joint Manipulations;Dry needling    PT Next Visit Plan focus on regaining AROM; gentle RTC strengthening, no active biceps strengthening for now    PT Home Exercise Plan Access Code: [email or web address]    Consulted and Agree with Plan of Care Patient           Patient will benefit from skilled therapeutic intervention in order to improve the following deficits and impairments:  Decreased endurance, Hypomobility, Obesity, Pain, Impaired UE functional use, Increased fascial restricitons, Decreased strength, Decreased activity tolerance, Decreased mobility, Increased muscle spasms, Impaired perceived functional ability, Decreased range of motion, Decreased coordination, Impaired flexibility, Increased edema  Visit Diagnosis: Chronic left shoulder pain  Muscle weakness (generalized)  Stiffness of left shoulder, not elsewhere classified  Localized edema     Problem List Patient Active Problem List   Diagnosis Date Noted  . Obstructive sleep apnea 10/05/2019  . Dyslipidemia 07/09/2019  . Annual physical exam 02/02/2019  . Diverticulitis of sigmoid colon 05/24/2016  . [place]bid [place] ([place] 04/25/2016  . ADD (attention deficit disorder) 12/28/2015  . Diverticulitis of large intestine with abscess without bleeding 09/13/2015  . PCP NOTES >>>>>>>>>>>>>>>>>>>> 09/07/2015  . Elevated WBCs 01/27/2014  . Diabetes ([place] 02/17/2013  . Tachycardia 08/29/2011  . TOBACCO USER 06/29/2010    MScot Jun PT, DPT, OCS, ATC 05/17/20  1:38 PM    [facility] [address]  [place] [address]Phone: [phone]  Fax:  [phone] Name: Spencer CHAIRESMRN: [id number] of Birth: [birth year]

## 2020-05-19 ENCOUNTER — Encounter: Payer: Self-pay | Admitting: Rehabilitative and Restorative Service Providers"

## 2020-05-19 ENCOUNTER — Ambulatory Visit (INDEPENDENT_AMBULATORY_CARE_PROVIDER_SITE_OTHER): Payer: BC Managed Care – PPO | Admitting: Rehabilitative and Restorative Service Providers"

## 2020-05-19 ENCOUNTER — Other Ambulatory Visit: Payer: Self-pay

## 2020-05-19 DIAGNOSIS — M25612 Stiffness of left shoulder, not elsewhere classified: Secondary | ICD-10-CM | POA: Diagnosis not present

## 2020-05-19 DIAGNOSIS — M25512 Pain in left shoulder: Secondary | ICD-10-CM | POA: Diagnosis not present

## 2020-05-19 DIAGNOSIS — R600 Localized edema: Secondary | ICD-10-CM | POA: Diagnosis not present

## 2020-05-19 DIAGNOSIS — G8929 Other chronic pain: Secondary | ICD-10-CM

## 2020-05-19 DIAGNOSIS — M6281 Muscle weakness (generalized): Secondary | ICD-10-CM | POA: Diagnosis not present

## 2020-05-19 NOTE — Therapy (Signed)
[place]pen[place], [place], [address] Phone: [phone]   Fax:  [phone]  Physical Therapy Treatment  Patient Details  Name: Spencer Good MRN: [id number] Date of Birth: [birth year] Referring Provider (PT): Dr. Marlou Sa   Encounter Date: 05/19/2020   PT End of Session - 05/19/20 1146    Visit Number 5    Number of Visits 24    Date for PT Re-Evaluation 06/28/20    Progress Note Due on Visit 10    PT Start Time 1143    PT Stop Time 1222    PT Time Calculation (min) 39 min    Activity Tolerance Patient tolerated treatment well    Behavior During Therapy Spencer Good for tasks assessed/performed           Past Medical History:  Diagnosis Date  . ADHD (attention deficit hyperactivity disorder)   . Diabetes ([place])   . Diverticulitis of colon with perforation    s/p sigmoid colectomy  . Gout   . Hernia, umbilical 4270   Laparoscopic umbilical hernia repair with mesh- s/p repair-resolved.  Spencer Good History of colon polyps   . Hyperlipidemia   . Hypertension   . Insomnia   . Obesity   . OSA (obstructive sleep apnea) dx 05-2013   Rx a Cpap 3-15, can't use - no longer has machine.  . Sleep apnea    doesn't use CPAP    Past Surgical History:  Procedure Laterality Date  . COLONOSCOPY    . EYE SURGERY    . INSERTION OF MESH N/A 02/01/2015   Procedure: INSERTION OF MESH;  Surgeon: Spencer Good;  Location: [place];  Service: General;  Laterality: N/A;  . LAPAROSCOPIC SIGMOID COLECTOMY N/A 05/24/2016   Procedure: LAPAROSCOPIC SIGMOID COLECTOMY;  Surgeon: Spencer Good;  [facility];  Service: General;  Laterality: N/A;  . LASIK Bilateral   . SHOULDER ARTHROSCOPY Left 04/24/2020   Procedure: LEFT SHOULDER MANIPULATION UNDER ANESTHESIA, ROTATOR INTERVAL RELEASE, DEBRIDEMENT, BICEPS TENODESIS;  Surgeon: Spencer Good;  Location: [place];  Service: Orthopedics;  Laterality: Left;  . UMBILICAL HERNIA REPAIR N/A  02/01/2015   Procedure: LAPAROSCOPIC UMBILICAL HERNIA REPAIR WITH MESH;  Surgeon: Spencer Good;  Location: [place];  Service: General;  Laterality: N/A;    There were no vitals filed for this visit.   Subjective Assessment - 05/19/20 1146    Subjective Pt. stated soreness with work conitnued similar but didn't have any pain to report upon arrival.    Pertinent History DM, ADHD, obesity    Patient Stated Goals regain use of Lt shoulder    Currently in Pain? No/denies    Pain Score 0-No pain    Pain Location Shoulder    Pain Orientation Left    Pain Descriptors / Indicators Sore    Pain Type Chronic pain    Pain Onset More than a month ago    Pain Frequency Intermittent    Aggravating Factors  working                             [facility] Adult PT Treatment/Exercise - 05/19/20 0001      Neuro Re-ed    Neuro Re-ed Details  rhythmic stabilizations in 100 deg flexion 30 second bouts      Shoulder Exercises: Supine   Horizontal ABduction Both;Strengthening   3 x 10 blue band     Shoulder Exercises: Standing  External Rotation 15 reps;Right   2 x 15, x 10 blue   Theraband Level (Shoulder External Rotation) Level 4 (Blue)    Other Standing Exercises BAPS screw in/out above shoulder 1 min x 2    Other Standing Exercises standing wall slides 2 x 10 c a couple second pause      Shoulder Exercises: ROM/Strengthening   UBE (Upper Arm Bike) Lvl 3 2 mins fwd/back each      Manual Therapy   Manual therapy comments g3-g4 inferior jt mobs in mid to end range flexion, scaption, MWM c ER in 75 deg , contract/relax Lt lat                    PT Short Term Goals - 05/08/20 1552      PT SHORT TERM GOAL #1   Title improve AROM Lt shoulder flexion and abduction to at least 140 deg for improved function    Status Partially Met    Target Date 05/31/20             PT Long Term Goals - 05/08/20 1553      PT LONG TERM GOAL #1   Title Patient will  demonstrate/report pain at worst less than or equal to 2/10 to facilitate minimal limitation in daily activity secondary to pain symptoms.    Time 8    Period Weeks    Status On-going      PT LONG TERM GOAL #2   Title Patient will demonstrate independent use of home exercise program to facilitate ability to maintain/progress functional gains from skilled physical therapy services.    Time 8    Period Weeks    Status On-going      PT LONG TERM GOAL #3   Title Patient will demonstrate return to work/recreational activity at previous level of function without limitations secondary due to condition    Time 8    Period Weeks    Status On-going      PT LONG TERM GOAL #4   Title Patient will demonstrate Lt [place] joint mobility WFL to facilitate usual self care, dressing, reaching overhead at PLOF s limitation due to symptoms.    Time 8    Period Weeks    Status On-going      PT LONG TERM GOAL #5   Title Patient will demonstrate Lt UE MMT 5/5 throughout to facilitate usual lifting, carrying in functional activity to PLOF s limitation.    Time 8    Period Weeks    Status On-going      PT LONG TERM GOAL #6   Title FOTO score improved to 70 for improved function    Status On-going                 Plan - 05/19/20 1223    Clinical Impression Statement Lat restriction and capsular restriction evident still at this time in elevation and ER mobility.  Beneift noted in passive movement after lat MET release techniques. Conitnued easy progression in strengthening indicated due to elevation restrictions against gravity.    Personal Factors and Comorbidities Other   DM, ADHS, obesity   Examination-Activity Limitations Sleep;Carry;Dressing;Hygiene/Grooming;Lift;Reach Overhead    Examination-Participation Restrictions Community Activity;Yard Work    Stability/Clinical Decision Making Stable/Uncomplicated    Rehab Potential Good    PT Frequency 2x / week    PT Duration 8 weeks    PT  Treatment/Interventions ADLs/Self Care Home Management;Cryotherapy;Electrical Stimulation;Iontophoresis 65m/ml Dexamethasone;Moist Heat;Traction;Balance training;Therapeutic exercise;Therapeutic activities;Functional  mobility training;Ultrasound;Neuromuscular re-education;Patient/family education;Manual techniques;Vasopneumatic Device;Taping;Passive range of motion;Spinal Manipulations;Joint Manipulations;Dry needling    PT Next Visit Plan focus on regaining AROM; gentle RTC strengthening, no active biceps strengthening for now    PT Home Exercise Plan Access Code: [email or web address]    Consulted and Agree with Plan of Care Patient           Patient will benefit from skilled therapeutic intervention in order to improve the following deficits and impairments:  Decreased endurance, Hypomobility, Obesity, Pain, Impaired UE functional use, Increased fascial restricitons, Decreased strength, Decreased activity tolerance, Decreased mobility, Increased muscle spasms, Impaired perceived functional ability, Decreased range of motion, Decreased coordination, Impaired flexibility, Increased edema  Visit Diagnosis: Chronic left shoulder pain  Muscle weakness (generalized)  Stiffness of left shoulder, not elsewhere classified  Localized edema     Problem List Patient Active Problem List   Diagnosis Date Noted  . Obstructive sleep apnea 10/05/2019  . Dyslipidemia 07/09/2019  . Annual physical exam 02/02/2019  . Diverticulitis of sigmoid colon 05/24/2016  . Morbid obesity ([place]) 04/25/2016  . ADD (attention deficit disorder) 12/28/2015  . Diverticulitis of large intestine with abscess without bleeding 09/13/2015  . PCP NOTES >>>>>>>>>>>>>>>>>>>> 09/07/2015  . Elevated WBCs 01/27/2014  . Diabetes ([place]) 02/17/2013  . Tachycardia 08/29/2011  . TOBACCO USER 06/29/2010    Scot Jun, PT, DPT, OCS, ATC 05/19/20  12:28 PM    [facility] [address] [place],  [place], [address] Phone: [phone]   Fax:  [phone]  Name: HELIX LAFONTAINE MRN: [id number] Date of Birth: 08-12-1975

## 2020-05-24 ENCOUNTER — Encounter: Payer: Self-pay | Admitting: Rehabilitative and Restorative Service Providers"

## 2020-05-24 ENCOUNTER — Other Ambulatory Visit: Payer: Self-pay

## 2020-05-24 ENCOUNTER — Ambulatory Visit (INDEPENDENT_AMBULATORY_CARE_PROVIDER_SITE_OTHER): Payer: BC Managed Care – PPO | Admitting: Rehabilitative and Restorative Service Providers"

## 2020-05-24 DIAGNOSIS — M25512 Pain in left shoulder: Secondary | ICD-10-CM | POA: Diagnosis not present

## 2020-05-24 DIAGNOSIS — M6281 Muscle weakness (generalized): Secondary | ICD-10-CM | POA: Diagnosis not present

## 2020-05-24 DIAGNOSIS — R600 Localized edema: Secondary | ICD-10-CM

## 2020-05-24 DIAGNOSIS — M25612 Stiffness of left shoulder, not elsewhere classified: Secondary | ICD-10-CM | POA: Diagnosis not present

## 2020-05-24 DIAGNOSIS — G8929 Other chronic pain: Secondary | ICD-10-CM

## 2020-05-24 NOTE — Therapy (Signed)
[facility] [address] [place], [place], [address] Phone: [phone]   Fax:  [phone]  Physical Therapy Treatment  Patient Details  Name: Spencer Good MRN: [id number] Date of Birth: [birth year] Referring Provider (PT): Dr. Marlou Sa   Encounter Date: 05/24/2020   PT End of Session - 05/24/20 1304    Visit Number 6    Number of Visits 24    Date for PT Re-Evaluation 06/28/20    Progress Note Due on Visit 10    PT Start Time 1300    PT Stop Time 1335    PT Time Calculation (min) 35 min    Activity Tolerance Patient limited by pain    Behavior During Therapy [facility] for tasks assessed/performed           Past Medical History:  Diagnosis Date  . ADHD (attention deficit hyperactivity disorder)   . Diabetes ([place])   . Diverticulitis of colon with perforation    s/p sigmoid colectomy  . Gout   . Hernia, umbilical 2248   Laparoscopic umbilical hernia repair with mesh- s/p repair-resolved.  Marland Kitchen History of colon polyps   . Hyperlipidemia   . Hypertension   . Insomnia   . Obesity   . OSA (obstructive sleep apnea) dx 05-2013   Rx a Cpap 3-15, can't use - no longer has machine.  . Sleep apnea    doesn't use CPAP    Past Surgical History:  Procedure Laterality Date  . COLONOSCOPY    . EYE SURGERY    . INSERTION OF MESH N/A 02/01/2015   Procedure: INSERTION OF MESH;  Surgeon: Rolm Bookbinder, MD;  Location: [place];  Service: General;  Laterality: N/A;  . LAPAROSCOPIC SIGMOID COLECTOMY N/A 05/24/2016   Procedure: LAPAROSCOPIC SIGMOID COLECTOMY;  Surgeon: Excell Seltzer, MD;  [facility];  Service: General;  Laterality: N/A;  . LASIK Bilateral   . SHOULDER ARTHROSCOPY Left 04/24/2020   Procedure: LEFT SHOULDER MANIPULATION UNDER ANESTHESIA, ROTATOR INTERVAL RELEASE, DEBRIDEMENT, BICEPS TENODESIS;  Surgeon: Meredith Pel, MD;  Location: [place];  Service: Orthopedics;  Laterality: Left;  . UMBILICAL HERNIA REPAIR N/A 02/01/2015    Procedure: LAPAROSCOPIC UMBILICAL HERNIA REPAIR WITH MESH;  Surgeon: Rolm Bookbinder, MD;  Location: [place];  Service: General;  Laterality: N/A;    There were no vitals filed for this visit.   Subjective Assessment - 05/24/20 1301    Subjective Pt. stated having pain increase in middle of weekend and Monday resulting in pain in anterior/lateral shoulder and into upper arm.  Pt. stated pain consistent regardless of movements.    Pertinent History DM, ADHD, obesity    Patient Stated Goals regain use of Lt shoulder    Pain Score 4    at worst 7/10   Pain Location Shoulder    Pain Orientation Left    Pain Descriptors / Indicators Sore;Sharp    Pain Onset More than a month ago    Pain Frequency Constant    Aggravating Factors  insidious worsened over weekend.    Pain Relieving Factors after surgery medication for sleeping                             [facility] Adult PT Treatment/Exercise - 05/24/20 0001      Shoulder Exercises: Supine   Other Supine Exercises wand flexion 1 lb 2 x 10, er hold in neutral 15 sec x 5 to tolerance  Shoulder Exercises: Pulleys   Flexion 2 minutes      Modalities   Modalities Electrical Stimulation      Electrical Stimulation   Electrical Stimulation Location 10    Electrical Stimulation Action IFC Lt shoulder    Electrical Stimulation Parameters to tolerance    Electrical Stimulation Goals Pain      Manual Therapy   Manual therapy comments g3 inferior jt mobs in flexion, scaption.  MWM c ER in 50 deg abd c AP glide , compression to Lt anterior deltoid, Lt lat, subscap                    PT Short Term Goals - 05/08/20 1552      PT SHORT TERM GOAL #1   Title improve AROM Lt shoulder flexion and abduction to at least 140 deg for improved function    Status Partially Met    Target Date 05/31/20             PT Long Term Goals - 05/08/20 1553      PT LONG TERM GOAL #1   Title Patient will demonstrate/report pain  at worst less than or equal to 2/10 to facilitate minimal limitation in daily activity secondary to pain symptoms.    Time 8    Period Weeks    Status On-going      PT LONG TERM GOAL #2   Title Patient will demonstrate independent use of home exercise program to facilitate ability to maintain/progress functional gains from skilled physical therapy services.    Time 8    Period Weeks    Status On-going      PT LONG TERM GOAL #3   Title Patient will demonstrate return to work/recreational activity at previous level of function without limitations secondary due to condition    Time 8    Period Weeks    Status On-going      PT LONG TERM GOAL #4   Title Patient will demonstrate Lt [place] joint mobility WFL to facilitate usual self care, dressing, reaching overhead at PLOF s limitation due to symptoms.    Time 8    Period Weeks    Status On-going      PT LONG TERM GOAL #5   Title Patient will demonstrate Lt UE MMT 5/5 throughout to facilitate usual lifting, carrying in functional activity to PLOF s limitation.    Time 8    Period Weeks    Status On-going      PT LONG TERM GOAL #6   Title FOTO score improved to 70 for improved function    Status On-going                 Plan - 05/24/20 1320    Clinical Impression Statement Pt. presented c increased severity of symptoms throughout Lt shoulder/upper arm with MOI.  Evaluation presented tenderness Lt GH anterior jt, anterior deltoid/infraspinatus, lat TrP.  Reduction in available tolerated range noted upon arrival but Pt. demonstrated improvement in passive mobility and assisted active ROM by end of treatment.    Personal Factors and Comorbidities Other   DM, ADHS, obesity   Examination-Activity Limitations Sleep;Carry;Dressing;Hygiene/Grooming;Lift;Reach Overhead    Examination-Participation Restrictions Community Activity;Yard Work    Stability/Clinical Decision Making Stable/Uncomplicated    Rehab Potential Good    PT Frequency  2x / week    PT Duration 8 weeks    PT Treatment/Interventions ADLs/Self Care Home Management;Cryotherapy;Electrical Stimulation;Iontophoresis 6m/ml Dexamethasone;Moist Heat;Traction;Balance training;Therapeutic exercise;Therapeutic activities;Functional mobility  training;Ultrasound;Neuromuscular re-education;Patient/family education;Manual techniques;Vasopneumatic Device;Taping;Passive range of motion;Spinal Manipulations;Joint Manipulations;Dry needling    PT Next Visit Plan Reassess pain response, ER/abd mobility gains, strengthening.    PT Home Exercise Plan Access Code: [email or web address]    Consulted and Agree with Plan of Care Patient           Patient will benefit from skilled therapeutic intervention in order to improve the following deficits and impairments:  Decreased endurance, Hypomobility, Obesity, Pain, Impaired UE functional use, Increased fascial restricitons, Decreased strength, Decreased activity tolerance, Decreased mobility, Increased muscle spasms, Impaired perceived functional ability, Decreased range of motion, Decreased coordination, Impaired flexibility, Increased edema  Visit Diagnosis: Chronic left shoulder pain  Muscle weakness (generalized)  Stiffness of left shoulder, not elsewhere classified  Localized edema     Problem List Patient Active Problem List   Diagnosis Date Noted  . Obstructive sleep apnea 10/05/2019  . Dyslipidemia 07/09/2019  . Annual physical exam 02/02/2019  . Diverticulitis of sigmoid colon 05/24/2016  . Morbid obesity ([place]) 04/25/2016  . ADD (attention deficit disorder) 12/28/2015  . Diverticulitis of large intestine with abscess without bleeding 09/13/2015  . PCP NOTES >>>>>>>>>>>>>>>>>>>> 09/07/2015  . Elevated WBCs 01/27/2014  . Diabetes ([place]) 02/17/2013  . Tachycardia 08/29/2011  . TOBACCO USER 06/29/2010    Scot Jun, PT, DPT, OCS, ATC 05/24/20  1:30 PM    [facility] [address] [place], [place], [address] Phone: [phone]   Fax:  [phone]  Name: Spencer Good MRN: [id number] Date of Birth: Dec 20, 1975

## 2020-05-26 ENCOUNTER — Ambulatory Visit (INDEPENDENT_AMBULATORY_CARE_PROVIDER_SITE_OTHER): Payer: BC Managed Care – PPO | Admitting: Rehabilitative and Restorative Service Providers"

## 2020-05-26 ENCOUNTER — Encounter: Payer: Self-pay | Admitting: Rehabilitative and Restorative Service Providers"

## 2020-05-26 ENCOUNTER — Other Ambulatory Visit: Payer: Self-pay

## 2020-05-26 DIAGNOSIS — M6281 Muscle weakness (generalized): Secondary | ICD-10-CM | POA: Diagnosis not present

## 2020-05-26 DIAGNOSIS — M25512 Pain in left shoulder: Secondary | ICD-10-CM | POA: Diagnosis not present

## 2020-05-26 DIAGNOSIS — G8929 Other chronic pain: Secondary | ICD-10-CM | POA: Diagnosis not present

## 2020-05-26 DIAGNOSIS — M25612 Stiffness of left shoulder, not elsewhere classified: Secondary | ICD-10-CM

## 2020-05-26 NOTE — Therapy (Addendum)
[facility] Physical Therapy [address] [place], [place], [address] Phone: [phone]   Fax:  [phone]  Physical Therapy Treatment  Patient Details  Name: Spencer Good MRN: [id number] Date of Birth: 08/19/1975 Referring Provider (PT): Dr. Marlou Sa  PHYSICAL THERAPY DISCHARGE SUMMARY  Visits from Start of Care: 7  Current functional level related to goals / functional outcomes: See note   Remaining deficits: See note   Education / Equipment: HEP  Plan:                                                    Patient goals were partially met. Patient is being discharged due to not returning since the last visit.  ?????     Encounter Date: 05/26/2020   PT End of Session - 05/26/20 1318    Visit Number 7    Number of Visits 24    Date for PT Re-Evaluation 06/28/20    Progress Note Due on Visit 10    PT Start Time 1148    PT Stop Time 1230    PT Time Calculation (min) 42 min    Activity Tolerance Patient tolerated treatment well;No increased pain    Behavior During Therapy WFL for tasks assessed/performed           Past Medical History:  Diagnosis Date  . ADHD (attention deficit hyperactivity disorder)   . Diabetes ([place])   . Diverticulitis of colon with perforation    s/p sigmoid colectomy  . Gout   . Hernia, umbilical 1245   Laparoscopic umbilical hernia repair with mesh- s/p repair-resolved.  Marland Kitchen History of colon polyps   . Hyperlipidemia   . Hypertension   . Insomnia   . Obesity   . OSA (obstructive sleep apnea) dx 05-2013   Rx a Cpap 3-15, can't use - no longer has machine.  . Sleep apnea    doesn't use CPAP    Past Surgical History:  Procedure Laterality Date  . COLONOSCOPY    . EYE SURGERY    . INSERTION OF MESH N/A 02/01/2015   Procedure: INSERTION OF MESH;  Surgeon: Rolm Bookbinder, MD;  Location: [place];  Service: General;  Laterality: N/A;  . LAPAROSCOPIC SIGMOID COLECTOMY N/A 05/24/2016   Procedure: LAPAROSCOPIC SIGMOID  COLECTOMY;  Surgeon: Excell Seltzer, MD;  [facility];  Service: General;  Laterality: N/A;  . LASIK Bilateral   . SHOULDER ARTHROSCOPY Left 04/24/2020   Procedure: LEFT SHOULDER MANIPULATION UNDER ANESTHESIA, ROTATOR INTERVAL RELEASE, DEBRIDEMENT, BICEPS TENODESIS;  Surgeon: Meredith Pel, MD;  Location: [place];  Service: Orthopedics;  Laterality: Left;  . UMBILICAL HERNIA REPAIR N/A 02/01/2015   Procedure: LAPAROSCOPIC UMBILICAL HERNIA REPAIR WITH MESH;  Surgeon: Rolm Bookbinder, MD;  Location: [place];  Service: General;  Laterality: N/A;    There were no vitals filed for this visit.   Subjective Assessment - 05/26/20 1315    Subjective [name] notes his pain has been better the past 2 days.  Stiffness is the #1 concern.    Pertinent History DM, ADHD, obesity    Patient Stated Goals regain use of Lt shoulder    Currently in Pain? Yes    Pain Score 3     Pain Location Shoulder    Pain Orientation Left    Pain Descriptors / Indicators Sore;Tightness;Aching    Pain  Type Chronic pain;Surgical pain    Pain Onset More than a month ago    Pain Frequency Intermittent    Aggravating Factors  End range AROM    Effect of Pain on Daily Activities Very stiff L shoulder with all ADLs                             [facility] Adult PT Treatment/Exercise - 05/26/20 0001      Exercises   Exercises Shoulder      Shoulder Exercises: Supine   Protraction Strengthening;Left;20 reps   3 seconds   Protraction Weight (lbs) 2#    External Rotation AROM;Left;20 reps    Theraband Level (Shoulder External Rotation) --   10 seconds   Internal Rotation AROM;Left;20 reps   10 seconds     Shoulder Exercises: Seated   Retraction Strengthening;Both;10 reps   5 seconds  Shoulder blade pinches   External Rotation AROM;Left;10 reps   5 seconds with elbow in, in standing   Flexion AAROM;Left;10 reps   10 seconds table slides palms in with protraction     Shoulder Exercises: Pulleys    Flexion 2 minutes   Palms in, protract 1st                 PT Education - 05/26/20 1317    Education Details Reviewed HEP with focus on scapular strengthening and capsular stretching.    Person(s) Educated Patient    Methods Explanation;Demonstration;Tactile cues;Verbal cues;Handout    Comprehension Verbal cues required;Returned demonstration;Need further instruction;Tactile cues required;Verbalized understanding            PT Short Term Goals - 05/26/20 1317      PT SHORT TERM GOAL #1   Title improve AROM Lt shoulder flexion and abduction to at least 140 deg for improved function    Status On-going    Target Date 05/31/20             PT Long Term Goals - 05/26/20 1318      PT LONG TERM GOAL #1   Title Patient will demonstrate/report pain at worst less than or equal to 2/10 to facilitate minimal limitation in daily activity secondary to pain symptoms.    Time 8    Period Weeks    Status On-going      PT LONG TERM GOAL #2   Title Patient will demonstrate independent use of home exercise program to facilitate ability to maintain/progress functional gains from skilled physical therapy services.    Time 8    Period Weeks    Status On-going      PT LONG TERM GOAL #3   Title Patient will demonstrate return to work/recreational activity at previous level of function without limitations secondary due to condition    Time 8    Period Weeks    Status On-going      PT LONG TERM GOAL #4   Title Patient will demonstrate Lt [place] joint mobility WFL to facilitate usual self care, dressing, reaching overhead at PLOF s limitation due to symptoms.    Time 8    Period Weeks    Status On-going      PT LONG TERM GOAL #5   Title Patient will demonstrate Lt UE MMT 5/5 throughout to facilitate usual lifting, carrying in functional activity to PLOF s limitation.    Time 8    Period Weeks    Status On-going  PT LONG TERM GOAL #6   Title FOTO score improved to 70 for improved  function    Status On-going                 Plan - 05/26/20 1319    Clinical Impression Statement Capsular flexibility and AROM are the priority with [name]'s rehabilitation.  Scapular strengthening is also being addressed to prepare him for return to full function.  HEP has a strong emphasis towards AROM and flexibility 5 weeks post-surgery.    Personal Factors and Comorbidities Other   DM, ADHS, obesity   Examination-Activity Limitations Sleep;Carry;Dressing;Hygiene/Grooming;Lift;Reach Overhead    Examination-Participation Restrictions Community Activity;Yard Work    Stability/Clinical Decision Making Stable/Uncomplicated    Rehab Potential Good    PT Frequency 2x / week    PT Duration 8 weeks    PT Treatment/Interventions ADLs/Self Care Home Management;Cryotherapy;Electrical Stimulation;Iontophoresis 110m/ml Dexamethasone;Moist Heat;Traction;Balance training;Therapeutic exercise;Therapeutic activities;Functional mobility training;Ultrasound;Neuromuscular re-education;Patient/family education;Manual techniques;Vasopneumatic Device;Taping;Passive range of motion;Spinal Manipulations;Joint Manipulations;Dry needling    PT Next Visit Plan Reassess pain response, ER/abd mobility gains, strengthening.    PT Home Exercise Plan Access Code: [email or web address]    Consulted and Agree with Plan of Care Patient           Patient will benefit from skilled therapeutic intervention in order to improve the following deficits and impairments:  Decreased endurance,Hypomobility,Obesity,Pain,Impaired UE functional use,Increased fascial restricitons,Decreased strength,Decreased activity tolerance,Decreased mobility,Increased muscle spasms,Impaired perceived functional ability,Decreased range of motion,Decreased coordination,Impaired flexibility,Increased edema  Visit Diagnosis: Chronic left shoulder pain  Stiffness of left shoulder, not elsewhere classified  Muscle weakness (generalized)     Problem  List Patient Active Problem List   Diagnosis Date Noted  . Obstructive sleep apnea 10/05/2019  . Dyslipidemia 07/09/2019  . Annual physical exam 02/02/2019  . Diverticulitis of sigmoid colon 05/24/2016  . Morbid obesity ([place] 04/25/2016  . ADD (attention deficit disorder) 12/28/2015  . Diverticulitis of large intestine with abscess without bleeding 09/13/2015  . PCP NOTES >>>>>>>>>>>>>>>>>>>> 09/07/2015  . Elevated WBCs 01/27/2014  . Diabetes ([place] 02/17/2013  . Tachycardia 08/29/2011  . TOBACCO USER 06/29/2010    RFarley LyPT, MPT 05/26/2020, 1:25 PM  [facility] [address]GSouth [place] [address]Phone: [phone]  Fax:  [phone] Name: TNIKHIL OSEIMRN: [id number] of Birth: 8Aug 12, 1977

## 2020-06-02 ENCOUNTER — Other Ambulatory Visit: Payer: Self-pay

## 2020-06-02 ENCOUNTER — Ambulatory Visit (INDEPENDENT_AMBULATORY_CARE_PROVIDER_SITE_OTHER): Payer: BC Managed Care – PPO | Admitting: Orthopedic Surgery

## 2020-06-02 ENCOUNTER — Ambulatory Visit: Payer: Self-pay

## 2020-06-02 DIAGNOSIS — M7502 Adhesive capsulitis of left shoulder: Secondary | ICD-10-CM | POA: Diagnosis not present

## 2020-06-02 MED ORDER — HYDROCODONE-ACETAMINOPHEN 5-325 MG PO TABS: ORAL_TABLET | ORAL | 0 refills | Status: DC

## 2020-06-02 MED ORDER — DICLOFENAC SODIUM 75 MG PO TBEC: DELAYED_RELEASE_TABLET | ORAL | 0 refills | Status: DC

## 2020-06-02 MED ORDER — METHOCARBAMOL 500 MG PO TABS: ORAL_TABLET | ORAL | 0 refills | Status: DC

## 2020-06-02 NOTE — Progress Notes (Signed)
u

## 2020-06-02 NOTE — Progress Notes (Signed)
Subjective: Patient is here for ultrasound-guided intra-articular left glenohumeral injection.  He has adhesive capsulitis.  History of diabetes, not adequately controlled.  Objective: Pain and decreased range of motion.  Procedure: Ultrasound guided injection is preferred based studies that show increased duration, increased effect, greater accuracy, decreased procedural pain, increased response rate, and decreased cost with ultrasound guided versus blind injection.   Verbal informed consent obtained.  Time-out conducted.  Noted no overlying erythema, induration, or other signs of local infection. Ultrasound-guided left glenohumeral injection: After sterile prep with Betadine, injected 4 cc 0.25% bupivocaine without epinephrine and 6 mg betamethasone using a 22-gauge spinal needle, passing the needle from posterior approach into the glenohumeral joint.  Injectate seen filling the joint capsule.  Emphasized the importance of proper diabetic diet.

## 2020-06-04 ENCOUNTER — Encounter: Payer: Self-pay | Admitting: Orthopedic Surgery

## 2020-06-04 NOTE — Progress Notes (Signed)
Post-Op Visit Note   Patient: Spencer Good           Date of Birth: December 28, 1975           MRN: [id number] Visit Date: 06/02/2020 PCP: Colon Branch, MD   Assessment & Plan:  Chief Complaint:  Chief Complaint  Patient presents with  . Left Shoulder - Pain   Visit Diagnoses:  1. Adhesive capsulitis of left shoulder     Plan: [name] is a 44 year old patient underwent left shoulder manipulation under anesthesia for longstanding frozen shoulder as well as biceps tenodesis.  Reports increased pain over the last 2 weeks.  Also describes stiffness and decreased range of motion.  He is 5 weeks out from surgery.  On examination his biceps tenodesis is holding up well.  No Popeye deformity.  Shoulder is becoming more stiff with about 20 degrees of external rotation 60 degrees of abduction and less than 90 of forward flexion.  Like to start him with some more therapy and try him on Robaxin intra-articular glenohumeral joint injection Voltaren taper and Norco for pain.  Come back in 4 weeks for clinical recheck.  He is really going to have to work exceedingly hard to regain his range of motion.  Follow-Up Instructions: Return in about 4 weeks (around 06/30/2020).   Orders:  Orders Placed This Encounter  Procedures  . US Guided Needle Placement - No Linked Charges   Meds ordered this encounter  Medications  . methocarbamol (ROBAXIN) 500 MG tablet    Sig: 1 po q 8hrs parn    Dispense:  30 tablet    Refill:  0  . diclofenac (VOLTAREN) 75 MG EC tablet    Sig: 1 po bid x 2 weeks then po q d x 2 weeks    Dispense:  60 tablet    Refill:  0  . HYDROcodone-acetaminophen (NORCO/VICODIN) 5-325 MG tablet    Sig: 1 po q 8 hrs prn pain    Dispense:  30 tablet    Refill:  0    Imaging: No results found.  PMFS History: Patient Active Problem List   Diagnosis Date Noted  . Obstructive sleep apnea 10/05/2019  . Dyslipidemia 07/09/2019  . Annual physical exam 02/02/2019  . Diverticulitis of  sigmoid colon 05/24/2016  . Morbid obesity ([place]) 04/25/2016  . ADD (attention deficit disorder) 12/28/2015  . Diverticulitis of large intestine with abscess without bleeding 09/13/2015  . PCP NOTES >>>>>>>>>>>>>>>>>>>> 09/07/2015  . Elevated WBCs 01/27/2014  . Diabetes ([place]) 02/17/2013  . Tachycardia 08/29/2011  . TOBACCO USER 06/29/2010   Past Medical History:  Diagnosis Date  . ADHD (attention deficit hyperactivity disorder)   . Diabetes ([place])   . Diverticulitis of colon with perforation    s/p sigmoid colectomy  . Gout   . Hernia, umbilical 4098   Laparoscopic umbilical hernia repair with mesh- s/p repair-resolved.  Marland Kitchen History of colon polyps   . Hyperlipidemia   . Hypertension   . Insomnia   . Obesity   . OSA (obstructive sleep apnea) dx 05-2013   Rx a Cpap 3-15, can't use - no longer has machine.  . Sleep apnea    doesn't use CPAP    Family History  Problem Relation Age of Onset  . Stroke Father        F, PGF  . CAD Other        MGF  . Heart disease Maternal Grandfather   . Colon cancer Neg Hx   .  Prostate cancer Neg Hx   . Diabetes Neg Hx        distant fam members  . Colon polyps Neg Hx   . Esophageal cancer Neg Hx   . Gallbladder disease Neg Hx   . Kidney disease Neg Hx   . Rectal cancer Neg Hx   . Stomach cancer Neg Hx     Past Surgical History:  Procedure Laterality Date  . COLONOSCOPY    . EYE SURGERY    . INSERTION OF MESH N/A 02/01/2015   Procedure: INSERTION OF MESH;  Surgeon: Rolm Bookbinder, MD;  Location: [place];  Service: General;  Laterality: N/A;  . LAPAROSCOPIC SIGMOID COLECTOMY N/A 05/24/2016   Procedure: LAPAROSCOPIC SIGMOID COLECTOMY;  Surgeon: Excell Seltzer, MD;  [facility];  Service: General;  Laterality: N/A;  . LASIK Bilateral   . SHOULDER ARTHROSCOPY Left 04/24/2020   Procedure: LEFT SHOULDER MANIPULATION UNDER ANESTHESIA, ROTATOR INTERVAL RELEASE, DEBRIDEMENT, BICEPS TENODESIS;  Surgeon: Meredith Pel, MD;  Location:  [place];  Service: Orthopedics;  Laterality: Left;  . UMBILICAL HERNIA REPAIR N/A 02/01/2015   Procedure: LAPAROSCOPIC UMBILICAL HERNIA REPAIR WITH MESH;  Surgeon: Rolm Bookbinder, MD;  Location: [place];  Service: General;  Laterality: N/A;   Social History   Occupational History  . Occupation: works at a [employer]  . Smoking status: Former Smoker    Packs/day: 1.00    Years: 20.00    Pack years: 20.00    Types: Cigarettes    Start date: 07/30/1992    Quit date: 12/16/2014    Years since quitting: 5.4  . Smokeless tobacco: Never Used  . Tobacco comment: + vaping , no cigarrets   Vaping Use  . Vaping Use: Every day  Substance and Sexual Activity  . Alcohol use: Yes    Alcohol/week: 0.0 standard drinks    Comment: 09/08/2015 "I'll have a few drinks/year"  . Drug use: No    Comment: Past  experimented some in college; not a regular user when I did use-none over 15 yrs  . Sexual activity: Not Currently

## 2020-06-05 DIAGNOSIS — M25512 Pain in left shoulder: Secondary | ICD-10-CM | POA: Diagnosis not present

## 2020-06-07 DIAGNOSIS — M25512 Pain in left shoulder: Secondary | ICD-10-CM | POA: Diagnosis not present

## 2020-06-11 ENCOUNTER — Other Ambulatory Visit: Payer: Self-pay | Admitting: Internal Medicine

## 2020-06-12 ENCOUNTER — Encounter: Payer: BC Managed Care – PPO | Admitting: Rehabilitative and Restorative Service Providers"

## 2020-06-12 MED ORDER — OZEMPIC (1 MG/DOSE) 2 MG/1.5ML ~~LOC~~ SOPN: 1.0000 mg | PEN_INJECTOR | SUBCUTANEOUS | 0 refills | Status: DC

## 2020-06-14 ENCOUNTER — Encounter: Payer: BC Managed Care – PPO | Admitting: Rehabilitative and Restorative Service Providers"

## 2020-06-19 ENCOUNTER — Encounter: Payer: BC Managed Care – PPO | Admitting: Physical Therapy

## 2020-06-21 ENCOUNTER — Other Ambulatory Visit: Payer: Self-pay | Admitting: Internal Medicine

## 2020-06-21 ENCOUNTER — Encounter: Payer: BC Managed Care – PPO | Admitting: Physical Therapy

## 2020-06-22 DIAGNOSIS — M25512 Pain in left shoulder: Secondary | ICD-10-CM | POA: Diagnosis not present

## 2020-06-26 ENCOUNTER — Encounter: Payer: BC Managed Care – PPO | Admitting: Physical Therapy

## 2020-06-28 ENCOUNTER — Encounter: Payer: BC Managed Care – PPO | Admitting: Physical Therapy

## 2020-06-28 DIAGNOSIS — M25512 Pain in left shoulder: Secondary | ICD-10-CM | POA: Diagnosis not present

## 2020-06-30 DIAGNOSIS — M25512 Pain in left shoulder: Secondary | ICD-10-CM | POA: Diagnosis not present

## 2020-07-02 ENCOUNTER — Other Ambulatory Visit: Payer: Self-pay | Admitting: Internal Medicine

## 2020-07-04 DIAGNOSIS — M25512 Pain in left shoulder: Secondary | ICD-10-CM | POA: Diagnosis not present

## 2020-07-06 ENCOUNTER — Other Ambulatory Visit: Payer: Self-pay | Admitting: Internal Medicine

## 2020-07-06 DIAGNOSIS — M25512 Pain in left shoulder: Secondary | ICD-10-CM | POA: Diagnosis not present

## 2020-07-11 DIAGNOSIS — M25512 Pain in left shoulder: Secondary | ICD-10-CM | POA: Diagnosis not present

## 2020-07-12 DIAGNOSIS — Z79899 Other long term (current) drug therapy: Secondary | ICD-10-CM | POA: Diagnosis not present

## 2020-07-12 DIAGNOSIS — F902 Attention-deficit hyperactivity disorder, combined type: Secondary | ICD-10-CM | POA: Diagnosis not present

## 2020-07-14 DIAGNOSIS — G4733 Obstructive sleep apnea (adult) (pediatric): Secondary | ICD-10-CM | POA: Diagnosis not present

## 2020-07-16 ENCOUNTER — Other Ambulatory Visit: Payer: Self-pay | Admitting: Internal Medicine

## 2020-07-17 ENCOUNTER — Ambulatory Visit: Payer: BC Managed Care – PPO | Admitting: Internal Medicine

## 2020-07-17 ENCOUNTER — Other Ambulatory Visit: Payer: Self-pay

## 2020-07-17 ENCOUNTER — Encounter: Payer: Self-pay | Admitting: Internal Medicine

## 2020-07-17 VITALS — BP 126/92 | HR 100 | Temp 97.7°F | Resp 16 | Ht 72.0 in | Wt 337.5 lb

## 2020-07-17 DIAGNOSIS — E1165 Type 2 diabetes mellitus with hyperglycemia: Secondary | ICD-10-CM | POA: Diagnosis not present

## 2020-07-17 DIAGNOSIS — Z23 Encounter for immunization: Secondary | ICD-10-CM | POA: Diagnosis not present

## 2020-07-17 LAB — HEMOGLOBIN A1C: Hgb A1c MFr Bld: 7.2 % — ABNORMAL HIGH (ref 4.6–6.5)

## 2020-07-17 LAB — BASIC METABOLIC PANEL
BUN: 19 mg/dL (ref 6–23)
CO2: 28 mEq/L (ref 19–32)
Calcium: 9.8 mg/dL (ref 8.4–10.5)
Chloride: 104 mEq/L (ref 96–112)
Creatinine, Ser: 1.03 mg/dL (ref 0.40–1.50)
GFR: 89.64 mL/min (ref >60.00)
Glucose, Bld: 158 mg/dL — ABNORMAL HIGH (ref 70–99)
Potassium: 4.4 mEq/L (ref 3.5–5.1)
Sodium: 138 mEq/L (ref 135–145)

## 2020-07-17 MED ORDER — RYBELSUS 7 MG PO TABS: 7.0000 mg | ORAL_TABLET | Freq: Every day | ORAL | 4 refills | Status: DC

## 2020-07-17 MED ORDER — LOSARTAN POTASSIUM 100 MG PO TABS: 100.0000 mg | ORAL_TABLET | Freq: Every day | ORAL | 4 refills | Status: DC

## 2020-07-17 MED ORDER — METFORMIN HCL 850 MG PO TABS: 850.0000 mg | ORAL_TABLET | Freq: Two times a day (BID) | ORAL | 4 refills | Status: DC

## 2020-07-17 NOTE — Progress Notes (Signed)
Pre visit review using our clinic review tool, if applicable. No additional management support is needed unless otherwise documented below in the visit note. 

## 2020-07-17 NOTE — Patient Instructions (Addendum)
Per our records you are due for an eye exam. Please contact your eye doctor to schedule an appointment. Please have them send copies of your office visit notes to [place]. Our fax number is [phone].   Take a daily walk!  Stop Ozempic Try Rybelsus 7 mg 1 tablet daily. If your blood sugars are not at goal in few weeks, please call because we can increase Rybelsus dose    Diabetes: Check your blood sugar  once a day  at different times of the day  GOALS: Fasting before a meal 70- 130 2 hours after a meal less than 180    GO TO THE LAB : Get the blood work     [place], [place] back for   a checkup in 3 to 4 months

## 2020-07-17 NOTE — Assessment & Plan Note (Signed)
DM: Ambulatory CBGs in the morning range from 90s to 120s, postprandially less than 180.  Currently on Metformin, jJardiance and Ozempic but he sometimes forgets to take his weekly Ozempic.  Request a change to a daily medication. Feet exam today normal, no symptoms. Plan: Switch Ozempic to Rybelsus,  check A1c. Encourage daily walking and a  healthy diet. HTN: On losartan, check a BMP Recent shoulder surgery: Doing great Preventive care: Tdap today, self DC'd aspirin, recommend to restart.    RTC 3 to 4 months

## 2020-07-17 NOTE — Progress Notes (Signed)
Subjective:    Patient ID: Spencer Good, male    DOB: [birth year], 45 y.o.   MRN: [id number]  DOS:  07/17/2020 Type of visit - description: f/u  In general feeling well. Had shoulder surgery: Doing great. Today we reviewed his ambulatory CBGs & BPs  Wt Readings from Last 3 Encounters:  07/17/20 (!) 337 lb 8 oz (153.1 kg)  04/24/20 (!) 342 lb (155.1 kg)  04/20/20 (!) 342 lb (155.1 kg)    Review of Systems Denies lower extremity paresthesias.   Past Medical History:  Diagnosis Date  . ADHD (attention deficit hyperactivity disorder)   . Diabetes ([place])   . Diverticulitis of colon with perforation    s/p sigmoid colectomy  . Gout   . Hernia, umbilical 1308   Laparoscopic umbilical hernia repair with mesh- s/p repair-resolved.  Marland Kitchen History of colon polyps   . Hyperlipidemia   . Hypertension   . Insomnia   . Obesity   . OSA (obstructive sleep apnea) dx 05-2013   Rx a Cpap 3-15, can't use - no longer has machine.  . Sleep apnea    doesn't use CPAP    Past Surgical History:  Procedure Laterality Date  . COLONOSCOPY    . EYE SURGERY    . INSERTION OF MESH N/A 02/01/2015   Procedure: INSERTION OF MESH;  Surgeon: Rolm Bookbinder, MD;  Location: [place];  Service: General;  Laterality: N/A;  . LAPAROSCOPIC SIGMOID COLECTOMY N/A 05/24/2016   Procedure: LAPAROSCOPIC SIGMOID COLECTOMY;  Surgeon: Excell Seltzer, MD;  [facility];  Service: General;  Laterality: N/A;  . LASIK Bilateral   . SHOULDER ARTHROSCOPY Left 04/24/2020   Procedure: LEFT SHOULDER MANIPULATION UNDER ANESTHESIA, ROTATOR INTERVAL RELEASE, DEBRIDEMENT, BICEPS TENODESIS;  Surgeon: Meredith Pel, MD;  Location: [place];  Service: Orthopedics;  Laterality: Left;  . UMBILICAL HERNIA REPAIR N/A 02/01/2015   Procedure: LAPAROSCOPIC UMBILICAL HERNIA REPAIR WITH MESH;  Surgeon: Rolm Bookbinder, MD;  Location: [place];  Service: General;  Laterality: N/A;    Allergies as of 07/17/2020   No Known Allergies      Medication List       Accurate as of July 17, 2020  8:21 AM. If you have any questions, ask your nurse or doctor.        aspirin EC 81 MG tablet Take 81 mg by mouth daily.   atorvastatin 20 MG tablet Commonly known as: LIPITOR Take 1 tablet (20 mg total) by mouth at bedtime.   blood glucose meter kit and supplies Dispense based on patient and insurance preference. Check blood sugars three times daily   diclofenac 75 MG EC tablet Commonly known as: VOLTAREN 1 po bid x 2 weeks then po q d x 2 weeks   empagliflozin 25 MG Tabs tablet Commonly known as: Jardiance Take 1 tablet (25 mg total) by mouth daily before breakfast.   fenofibrate 160 MG tablet Take 1 tablet (160 mg total) by mouth daily.   glucose blood test strip Commonly known as: OneTouch Verio Check blood sugar once daily   HYDROcodone-acetaminophen 5-325 MG tablet Commonly known as: NORCO/VICODIN 1 po q 8 hrs prn pain   losartan 100 MG tablet Commonly known as: COZAAR Take 1 tablet (100 mg total) by mouth daily.   metFORMIN 850 MG tablet Commonly known as: GLUCOPHAGE Take 1 tablet (850 mg total) by mouth 2 (two) times daily with a meal.   methocarbamol 500 MG tablet Commonly known as: Robaxin Take 1 tablet (  500 mg total) by mouth every 8 (eight) hours as needed.   methocarbamol 500 MG tablet Commonly known as: ROBAXIN 1 po q 8hrs parn   multivitamin tablet Take 1 tablet by mouth daily.   ONE TOUCH LANCETS Misc Check blood sugar once daily   OneTouch Verio Flex System w/Device Kit Check blood sugar once daily   oxyCODONE-acetaminophen 5-325 MG tablet Commonly known as: Percocet Take 1 tablet by mouth every 4 (four) hours as needed for severe pain.   Ozempic (1 MG/DOSE) 2 MG/1.5ML Sopn Generic drug: Semaglutide (1 MG/DOSE) Inject 1 mg into the skin once a week.   PROBIOTIC PO Take 1 tablet by mouth daily.   psyllium 58.6 % powder Commonly known as: METAMUCIL Take 1 packet by mouth 4  (four) times a week.   Vyvanse 50 MG capsule Generic drug: lisdexamfetamine Take 50 mg by mouth daily.          Objective:   Physical Exam BP (!) 126/92 (BP Location: Left Arm, Patient Position: Sitting, Cuff Size: Normal)   Pulse 100   Temp 97.7 F (36.5 C) (Oral)   Resp 16   Ht 6' (1.829 m)   Wt (!) 337 lb 8 oz (153.1 kg)   SpO2 97%   BMI 45.77 kg/m  General:   Well developed, NAD, BMI noted. HEENT:  Normocephalic . Face symmetric, atraumatic Lungs:  CTA B Normal respiratory effort, no intercostal retractions, no accessory muscle use. Heart: RRR,  no murmur. DM foot exam: No edema, good pulses, pinprick examination normal Skin: Not pale. Not jaundice Neurologic:  alert & oriented X3.  Speech normal, gait appropriate for age and unassisted Psych--  Cognition and judgment appear intact.  Cooperative with normal attention span and concentration.  Behavior appropriate. No anxious or depressed appearing.      Assessment      Assessment DM-- dx 2014, took invokana, metformin, dc ~06-2014 as diet improved, a1c 13 ( 08-2015), meds restarted HTN Dyslipidemia Gout Morbid obesity OSA ADHD- per other provider  Insomnia H/o diverticulitis, sigmoid,w/ perf, conservative Rx 10-2014 , Admitted again 08/2015, abscess, s/p drainage. Tobacco- wellbutrin intolerant   PLAN: DM: Ambulatory CBGs in the morning range from 90s to 120s, postprandially less than 180.  Currently on Metformin, jJardiance and Ozempic but he sometimes forgets to take his weekly Ozempic.  Request a change to a daily medication. Feet exam today normal, no symptoms. Plan: Switch Ozempic to Rybelsus,  check A1c. Encourage daily walking and a  healthy diet. HTN: On losartan, check a BMP Recent shoulder surgery: Doing great Preventive care: Tdap today, self DC'd aspirin, recommend to restart.    RTC 3 to 4 months   This visit occurred during the SARS-CoV-2 public health emergency.  Safety protocols were in  place, including screening questions prior to the visit, additional usage of staff PPE, and extensive cleaning of exam room while observing appropriate contact time as indicated for disinfecting solutions.

## 2020-07-18 DIAGNOSIS — M25512 Pain in left shoulder: Secondary | ICD-10-CM | POA: Diagnosis not present

## 2020-07-19 ENCOUNTER — Other Ambulatory Visit: Payer: Self-pay

## 2020-07-19 ENCOUNTER — Ambulatory Visit: Payer: BC Managed Care – PPO | Admitting: Orthopedic Surgery

## 2020-07-24 DIAGNOSIS — M25512 Pain in left shoulder: Secondary | ICD-10-CM | POA: Diagnosis not present

## 2020-07-26 DIAGNOSIS — M25512 Pain in left shoulder: Secondary | ICD-10-CM | POA: Diagnosis not present

## 2020-07-31 DIAGNOSIS — M25512 Pain in left shoulder: Secondary | ICD-10-CM | POA: Diagnosis not present

## 2020-08-03 ENCOUNTER — Ambulatory Visit: Payer: BC Managed Care – PPO | Admitting: Orthopedic Surgery

## 2020-08-03 DIAGNOSIS — M7502 Adhesive capsulitis of left shoulder: Secondary | ICD-10-CM

## 2020-08-06 ENCOUNTER — Encounter: Payer: Self-pay | Admitting: Orthopedic Surgery

## 2020-08-06 NOTE — Progress Notes (Signed)
Post-Op Visit Note   Patient: Spencer Good           Date of Birth: 1975/07/02           MRN: [id number] Visit Date: 08/03/2020 PCP: Colon Branch, MD   Assessment & Plan:  Chief Complaint:  Chief Complaint  Patient presents with  . Left Shoulder - Routine Post Op   Visit Diagnoses:  1. Adhesive capsulitis of left shoulder     Plan: Spencer Good is now about 3 months out from left shoulder manipulation and biceps tenodesis.  On exam he has external rotation to 50 degrees abduction 100 and forward flexion 170.  Overall he is doing well.  He is making progress in physical therapy and not taking any medications.  He is pleased with his result.  Continue with home exercise program and follow-up as needed  Follow-Up Instructions: Return if symptoms worsen or fail to improve.   Orders:  No orders of the defined types were placed in this encounter.  No orders of the defined types were placed in this encounter.   Imaging: No results found.  PMFS History: Patient Active Problem List   Diagnosis Date Noted  . Obstructive sleep apnea 10/05/2019  . Dyslipidemia 07/09/2019  . Annual physical exam 02/02/2019  . Diverticulitis of sigmoid colon 05/24/2016  . Morbid obesity ([place]) 04/25/2016  . ADD (attention deficit disorder) 12/28/2015  . Diverticulitis of large intestine with abscess without bleeding 09/13/2015  . PCP NOTES >>>>>>>>>>>>>>>>>>>> 09/07/2015  . Elevated WBCs 01/27/2014  . Diabetes ([place]) 02/17/2013  . Tachycardia 08/29/2011  . TOBACCO USER 06/29/2010   Past Medical History:  Diagnosis Date  . ADHD (attention deficit hyperactivity disorder)   . Diabetes ([place])   . Diverticulitis of colon with perforation    s/p sigmoid colectomy  . Gout   . Hernia, umbilical 4580   Laparoscopic umbilical hernia repair with mesh- s/p repair-resolved.  Marland Kitchen History of colon polyps   . Hyperlipidemia   . Hypertension   . Insomnia   . Obesity   . OSA (obstructive sleep apnea) dx  05-2013   Rx a Cpap 3-15, can't use - no longer has machine.  . Sleep apnea    doesn't use CPAP    Family History  Problem Relation Age of Onset  . Stroke Father        F, PGF  . CAD Other        MGF  . Heart disease Maternal Grandfather   . Colon cancer Neg Hx   . Prostate cancer Neg Hx   . Diabetes Neg Hx        distant fam members  . Colon polyps Neg Hx   . Esophageal cancer Neg Hx   . Gallbladder disease Neg Hx   . Kidney disease Neg Hx   . Rectal cancer Neg Hx   . Stomach cancer Neg Hx     Past Surgical History:  Procedure Laterality Date  . COLONOSCOPY    . EYE SURGERY    . INSERTION OF MESH N/A 02/01/2015   Procedure: INSERTION OF MESH;  Surgeon: Rolm Bookbinder, MD;  Location: [facility];  Service: General;  Laterality: N/A;  . LAPAROSCOPIC SIGMOID COLECTOMY N/A 05/24/2016   Procedure: LAPAROSCOPIC SIGMOID COLECTOMY;  Surgeon: Excell Seltzer, MD;  Location[facility];  Service: General;  Laterality: N/A;  . LASIK Bilateral   . SHOULDER ARTHROSCOPY Left 04/24/2020   Procedure: LEFT SHOULDER MANIPULATION UNDER ANESTHESIA, ROTATOR INTERVAL RELEASE, DEBRIDEMENT, BICEPS  TENODESIS;  Surgeon: Meredith Pel, MD;  Location: [place];  Service: Orthopedics;  Laterality: Left;  . UMBILICAL HERNIA REPAIR N/A 02/01/2015   Procedure: LAPAROSCOPIC UMBILICAL HERNIA REPAIR WITH MESH;  Surgeon: Rolm Bookbinder, MD;  Location: [facility];  Service: General;  Laterality: N/A;   Social History   Occupational History  . Occupation: works at a [employer]  . Smoking status: Former Smoker    Packs/day: 1.00    Years: 20.00    Pack years: 20.00    Types: Cigarettes    Start date: 07/30/1992    Quit date: 12/16/2014    Years since quitting: 5.6  . Smokeless tobacco: Never Used  . Tobacco comment: + vaping , no cigarrets   Vaping Use  . Vaping Use: Every day  Substance and Sexual Activity  . Alcohol use: Yes    Alcohol/week: 0.0 standard drinks    Comment: 09/08/2015 "I'll have a few  drinks/year"  . Drug use: No    Comment: Past  experimented some in college; not a regular user when I did use-none over 15 yrs  . Sexual activity: Not Currently

## 2020-08-07 ENCOUNTER — Other Ambulatory Visit: Payer: Self-pay | Admitting: Internal Medicine

## 2020-08-09 DIAGNOSIS — M25512 Pain in left shoulder: Secondary | ICD-10-CM | POA: Diagnosis not present

## 2020-08-14 DIAGNOSIS — M25512 Pain in left shoulder: Secondary | ICD-10-CM | POA: Diagnosis not present

## 2020-08-14 DIAGNOSIS — G4733 Obstructive sleep apnea (adult) (pediatric): Secondary | ICD-10-CM | POA: Diagnosis not present

## 2020-08-22 ENCOUNTER — Encounter: Payer: Self-pay | Admitting: Internal Medicine

## 2020-08-22 MED ORDER — RYBELSUS 14 MG PO TABS: 14.0000 mg | ORAL_TABLET | Freq: Every day | ORAL | 4 refills | Status: DC

## 2020-08-28 DIAGNOSIS — M25512 Pain in left shoulder: Secondary | ICD-10-CM | POA: Diagnosis not present

## 2020-09-04 DIAGNOSIS — M25512 Pain in left shoulder: Secondary | ICD-10-CM | POA: Diagnosis not present

## 2020-09-05 ENCOUNTER — Encounter: Payer: Self-pay | Admitting: Internal Medicine

## 2020-09-11 DIAGNOSIS — M25512 Pain in left shoulder: Secondary | ICD-10-CM | POA: Diagnosis not present

## 2020-09-11 DIAGNOSIS — G4733 Obstructive sleep apnea (adult) (pediatric): Secondary | ICD-10-CM | POA: Diagnosis not present

## 2020-09-13 ENCOUNTER — Other Ambulatory Visit: Payer: Self-pay | Admitting: Internal Medicine

## 2020-09-26 ENCOUNTER — Encounter: Payer: Self-pay | Admitting: Internal Medicine

## 2020-10-03 DIAGNOSIS — F902 Attention-deficit hyperactivity disorder, combined type: Secondary | ICD-10-CM | POA: Diagnosis not present

## 2020-10-03 DIAGNOSIS — Z79899 Other long term (current) drug therapy: Secondary | ICD-10-CM | POA: Diagnosis not present

## 2020-10-10 ENCOUNTER — Ambulatory Visit: Payer: BC Managed Care – PPO | Admitting: Internal Medicine

## 2020-10-10 ENCOUNTER — Other Ambulatory Visit: Payer: Self-pay

## 2020-10-10 VITALS — BP 138/94 | HR 94 | Temp 97.4°F | Ht 72.0 in | Wt 346.0 lb

## 2020-10-10 DIAGNOSIS — I10 Essential (primary) hypertension: Secondary | ICD-10-CM

## 2020-10-10 DIAGNOSIS — E785 Hyperlipidemia, unspecified: Secondary | ICD-10-CM | POA: Diagnosis not present

## 2020-10-10 DIAGNOSIS — E1165 Type 2 diabetes mellitus with hyperglycemia: Secondary | ICD-10-CM

## 2020-10-10 LAB — COMPREHENSIVE METABOLIC PANEL
ALT: 25 U/L (ref 0–53)
AST: 15 U/L (ref 0–37)
Albumin: 4.4 g/dL (ref 3.5–5.2)
Alkaline Phosphatase: 75 U/L (ref 39–117)
BUN: 22 mg/dL (ref 6–23)
CO2: 27 mEq/L (ref 19–32)
Calcium: 9.5 mg/dL (ref 8.4–10.5)
Chloride: 102 mEq/L (ref 96–112)
Creatinine, Ser: 1.06 mg/dL (ref 0.40–1.50)
GFR: 86.46 mL/min (ref >60.00)
Glucose, Bld: 151 mg/dL — ABNORMAL HIGH (ref 70–99)
Potassium: 4.7 mEq/L (ref 3.5–5.1)
Sodium: 138 mEq/L (ref 135–145)
Total Bilirubin: 0.5 mg/dL (ref 0.2–1.2)
Total Protein: 7.1 g/dL (ref 6.0–8.3)

## 2020-10-10 LAB — LIPID PANEL
Cholesterol: 148 mg/dL (ref 0–200)
HDL: 27.9 mg/dL — ABNORMAL LOW (ref >39.00)
NonHDL: 119.73
Total CHOL/HDL Ratio: 5
Triglycerides: 226 mg/dL — ABNORMAL HIGH (ref 0.0–149.0)
VLDL: 45.2 mg/dL — ABNORMAL HIGH (ref 0.0–40.0)

## 2020-10-10 LAB — LDL CHOLESTEROL, DIRECT: Direct LDL: 100 mg/dL

## 2020-10-10 LAB — HEMOGLOBIN A1C: Hgb A1c MFr Bld: 7.9 % — ABNORMAL HIGH (ref 4.6–6.5)

## 2020-10-10 NOTE — Assessment & Plan Note (Signed)
DM: Last A1c 7.2, was started on Rybelsus, currently on 14 mg, he also takes Jardiance, metformin.  Ambulatory CBGs in the morning remain about the same even after Rybelsus, around 144, 150.  Will check a A1c. Morbid obesity: Weight increase despite taking Jardiance and Rybelsus, admits to a poor diet.  Offered a referral to a nutritionist, he will think about it. HTN: Good compliance with losartan, BP today slightly up, at home BPs are reportedly normal.  No change.  Check a CMP High cholesterol: On Lipitor and fenofibrate, check FLP RTC 3 months.

## 2020-10-10 NOTE — Patient Instructions (Signed)
Check the  blood pressure regularly BP GOAL is between 110/65 and  135/85. If it is consistently higher or lower, let me know  When you are ready to talk to a nutritionist let us know   GO TO THE LAB : Get the blood work     [place], [place] back for   a checkup in 3 months

## 2020-10-10 NOTE — Progress Notes (Signed)
Subjective:    Patient ID: Spencer Good, male    DOB: 1975-10-21, 45 y.o.   MRN: [id number]  DOS:  10/10/2020 Type of visit - description: Routine visit Since the last office visit is feeling well. Good med compliance We talk about his ambulatory BPs, ambulatory CBGs. He reports is not doing well with diet. Weight gain noted.   Wt Readings from Last 3 Encounters:  10/10/20 (!) 346 lb (156.9 kg)  07/17/20 (!) 337 lb 8 oz (153.1 kg)  04/24/20 (!) 342 lb (155.1 kg)     Review of Systems See above   Past Medical History:  Diagnosis Date  . ADHD (attention deficit hyperactivity disorder)   . Diabetes ([place])   . Diverticulitis of colon with perforation    s/p sigmoid colectomy  . Gout   . Hernia, umbilical 2671   Laparoscopic umbilical hernia repair with mesh- s/p repair-resolved.  Marland Kitchen History of colon polyps   . Hyperlipidemia   . Hypertension   . Insomnia   . Obesity   . OSA (obstructive sleep apnea) dx 05-2013   Rx a Cpap 3-15, can't use - no longer has machine.  . Sleep apnea    doesn't use CPAP    Past Surgical History:  Procedure Laterality Date  . COLONOSCOPY    . EYE SURGERY    . INSERTION OF MESH N/A 02/01/2015   Procedure: INSERTION OF MESH;  Surgeon: Rolm Bookbinder, MD;  Location: [facility];  Service: General;  Laterality: N/A;  . LAPAROSCOPIC SIGMOID COLECTOMY N/A 05/24/2016   Procedure: LAPAROSCOPIC SIGMOID COLECTOMY;  Surgeon: Excell Seltzer, MD;  [facility];  Service: General;  Laterality: N/A;  . LASIK Bilateral   . SHOULDER ARTHROSCOPY Left 04/24/2020   Procedure: LEFT SHOULDER MANIPULATION UNDER ANESTHESIA, ROTATOR INTERVAL RELEASE, DEBRIDEMENT, BICEPS TENODESIS;  Surgeon: Meredith Pel, MD;  Location: [facility];  Service: Orthopedics;  Laterality: Left;  . UMBILICAL HERNIA REPAIR N/A 02/01/2015   Procedure: LAPAROSCOPIC UMBILICAL HERNIA REPAIR WITH MESH;  Surgeon: Rolm Bookbinder, MD;  Location: [facility];  Service: General;  Laterality: N/A;     Allergies as of 10/10/2020   No Known Allergies     Medication List       Accurate as of October 10, 2020  8:23 PM. If you have any questions, ask your nurse or doctor.        aspirin EC 81 MG tablet Take 81 mg by mouth daily.   atorvastatin 20 MG tablet Commonly known as: LIPITOR Take 1 tablet (20 mg total) by mouth at bedtime.   blood glucose meter kit and supplies Dispense based on patient and insurance preference. Check blood sugars three times daily   empagliflozin 25 MG Tabs tablet Commonly known as: Jardiance Take 1 tablet (25 mg total) by mouth daily.   fenofibrate 160 MG tablet TAKE 1 TABLET(160 MG) BY MOUTH DAILY   glucose blood test strip Commonly known as: OneTouch Verio Check blood sugar once daily   losartan 100 MG tablet Commonly known as: COZAAR Take 1 tablet (100 mg total) by mouth daily.   metFORMIN 850 MG tablet Commonly known as: GLUCOPHAGE Take 1 tablet (850 mg total) by mouth with breakfast, with lunch, and with evening meal.   multivitamin tablet Take 1 tablet by mouth daily.   ONE TOUCH LANCETS Misc Check blood sugar once daily   OneTouch Verio Flex System w/Device Kit Check blood sugar once daily   PROBIOTIC PO Take 1 tablet by mouth daily.  psyllium 58.6 % powder Commonly known as: METAMUCIL Take 1 packet by mouth 4 (four) times a week.   Rybelsus 14 MG Tabs Generic drug: Semaglutide Take 14 mg by mouth daily.   Vyvanse 50 MG capsule Generic drug: lisdexamfetamine Take 50 mg by mouth daily.          Objective:   Physical Exam BP (!) 138/94 (BP Location: Right Arm, Patient Position: Sitting, Cuff Size: Large)   Pulse 94   Temp (!) 97.4 F (36.3 C) (Temporal)   Ht 6' (1.829 m)   Wt (!) 346 lb (156.9 kg)   SpO2 98%   BMI 46.93 kg/m  General:   Well developed, NAD, BMI noted. HEENT:  Normocephalic . Face symmetric, atraumatic Lungs:  CTA B Normal respiratory effort, no intercostal retractions, no accessory  muscle use. Heart: RRR,  no murmur.  Lower extremities: no pretibial edema bilaterally  Skin: Not pale. Not jaundice Neurologic:  alert & oriented X3.  Speech normal, gait appropriate for age and unassisted Psych--  Cognition and judgment appear intact.  Cooperative with normal attention span and concentration.  Behavior appropriate. No anxious or depressed appearing.      Assessment      Assessment DM-- dx 2014, took invokana, metformin, dc ~06-2014 as diet improved, a1c 13 ( 08-2015), meds restarted HTN Dyslipidemia Gout Morbid obesity OSA ADHD- per other provider  Insomnia H/o diverticulitis, sigmoid,w/ perf, conservative Rx 10-2014 , Admitted again 08/2015, abscess, s/p drainage. Tobacco- wellbutrin intolerant   PLAN: DM: Last A1c 7.2, was started on Rybelsus, currently on 14 mg, he also takes Jardiance, metformin.  Ambulatory CBGs in the morning remain about the same even after Rybelsus, around 144, 150.  Will check a A1c. Morbid obesity: Weight increase despite taking Jardiance and Rybelsus, admits to a poor diet.  Offered a referral to a nutritionist, he will think about it. HTN: Good compliance with losartan, BP today slightly up, at home BPs are reportedly normal.  No change.  Check a CMP High cholesterol: On Lipitor and fenofibrate, check FLP RTC 3 months.  This visit occurred during the SARS-CoV-2 public health emergency.  Safety protocols were in place, including screening questions prior to the visit, additional usage of staff PPE, and extensive cleaning of exam room while observing appropriate contact time as indicated for disinfecting solutions.

## 2020-10-12 DIAGNOSIS — G4733 Obstructive sleep apnea (adult) (pediatric): Secondary | ICD-10-CM | POA: Diagnosis not present

## 2020-10-18 ENCOUNTER — Encounter: Payer: Self-pay | Admitting: Internal Medicine

## 2020-10-19 MED ORDER — METFORMIN HCL 850 MG PO TABS: 850.0000 mg | ORAL_TABLET | Freq: Three times a day (TID) | ORAL | 1 refills | Status: DC

## 2020-12-11 ENCOUNTER — Other Ambulatory Visit: Payer: Self-pay | Admitting: Internal Medicine

## 2021-01-04 DIAGNOSIS — F902 Attention-deficit hyperactivity disorder, combined type: Secondary | ICD-10-CM | POA: Diagnosis not present

## 2021-01-04 DIAGNOSIS — Z79899 Other long term (current) drug therapy: Secondary | ICD-10-CM | POA: Diagnosis not present

## 2021-01-10 ENCOUNTER — Other Ambulatory Visit: Payer: Self-pay | Admitting: Internal Medicine

## 2021-02-06 ENCOUNTER — Ambulatory Visit (INDEPENDENT_AMBULATORY_CARE_PROVIDER_SITE_OTHER): Payer: BC Managed Care – PPO | Admitting: Internal Medicine

## 2021-02-06 ENCOUNTER — Encounter: Payer: Self-pay | Admitting: Internal Medicine

## 2021-02-06 ENCOUNTER — Other Ambulatory Visit: Payer: Self-pay

## 2021-02-06 VITALS — BP 136/92 | HR 112 | Temp 98.2°F | Resp 18 | Ht 72.0 in | Wt 341.0 lb

## 2021-02-06 DIAGNOSIS — E119 Type 2 diabetes mellitus without complications: Secondary | ICD-10-CM

## 2021-02-06 DIAGNOSIS — Z808 Family history of malignant neoplasm of other organs or systems: Secondary | ICD-10-CM

## 2021-02-06 DIAGNOSIS — Z1283 Encounter for screening for malignant neoplasm of skin: Secondary | ICD-10-CM | POA: Diagnosis not present

## 2021-02-06 DIAGNOSIS — Z0100 Encounter for examination of eyes and vision without abnormal findings: Secondary | ICD-10-CM

## 2021-02-06 DIAGNOSIS — E1165 Type 2 diabetes mellitus with hyperglycemia: Secondary | ICD-10-CM | POA: Diagnosis not present

## 2021-02-06 LAB — HEMOGLOBIN A1C: Hgb A1c MFr Bld: 7.3 % — ABNORMAL HIGH (ref 4.6–6.5)

## 2021-02-06 NOTE — Progress Notes (Signed)
Subjective:    Patient ID: Spencer Good, male    DOB: 11/04/1975, 45 y.o.   MRN: [id number]  DOS:  02/06/2021 Type of visit - description: ROV  Routine follow-up. Since the last visit he is doing okay. Has lost some weight. Is concerned about multiple moles he has at the back.  Wt Readings from Last 3 Encounters:  02/06/21 (!) 341 lb (154.7 kg)  10/10/20 (!) 346 lb (156.9 kg)  07/17/20 (!) 337 lb 8 oz (153.1 kg)     Review of Systems Denies chest pain or difficulty breathing No nausea or vomiting No lower extremity paresthesias  Past Medical History:  Diagnosis Date   ADHD (attention deficit hyperactivity disorder)    Diabetes ([place])    Diverticulitis of colon with perforation    s/p sigmoid colectomy   Gout    Hernia, umbilical 9449   Laparoscopic umbilical hernia repair with mesh- s/p repair-resolved.   History of colon polyps    Hyperlipidemia    Hypertension    Insomnia    Obesity    OSA (obstructive sleep apnea) dx 05-2013   Rx a Cpap 3-15, can't use - no longer has machine.   Sleep apnea    doesn't use CPAP    Past Surgical History:  Procedure Laterality Date   COLONOSCOPY     EYE SURGERY     INSERTION OF MESH N/A 02/01/2015   Procedure: INSERTION OF MESH;  Surgeon: Rolm Bookbinder, MD;  Location: [facility];  Service: General;  Laterality: N/A;   LAPAROSCOPIC SIGMOID COLECTOMY N/A 05/24/2016   Procedure: LAPAROSCOPIC SIGMOID COLECTOMY;  Surgeon: Excell Seltzer, MD;  [facility];  Service: General;  Laterality: N/A;   LASIK Bilateral    SHOULDER ARTHROSCOPY Left 04/24/2020   Procedure: LEFT SHOULDER MANIPULATION UNDER ANESTHESIA, ROTATOR INTERVAL RELEASE, DEBRIDEMENT, BICEPS TENODESIS;  Surgeon: Meredith Pel, MD;  Location: [facility];  Service: Orthopedics;  Laterality: Left;   UMBILICAL HERNIA REPAIR N/A 02/01/2015   Procedure: LAPAROSCOPIC UMBILICAL HERNIA REPAIR WITH MESH;  Surgeon: Rolm Bookbinder, MD;  Location: [facility];  Service: General;   Laterality: N/A;    Allergies as of 02/06/2021   No Known Allergies      Medication List        Accurate as of February 06, 2021  1:02 PM. If you have any questions, ask your nurse or doctor.          STOP taking these medications    blood glucose meter kit and supplies Stopped by: Kathlene November, MD   [place] w/Device Kit Stopped by: Kathlene November, MD       TAKE these medications    aspirin EC 81 MG tablet Take 81 mg by mouth daily.   atorvastatin 20 MG tablet Commonly known as: LIPITOR Take 1 tablet (20 mg total) by mouth at bedtime.   empagliflozin 25 MG Tabs tablet Commonly known as: Jardiance Take 1 tablet (25 mg total) by mouth daily.   fenofibrate 160 MG tablet TAKE 1 TABLET(160 MG) BY MOUTH DAILY   glucose blood test strip Commonly known as: OneTouch Verio Check blood sugar once daily   losartan 100 MG tablet Commonly known as: COZAAR Take 1 tablet (100 mg total) by mouth daily.   metFORMIN 850 MG tablet Commonly known as: GLUCOPHAGE Take 1 tablet (850 mg total) by mouth with breakfast, with lunch, and with evening meal.   multivitamin tablet Take 1 tablet by mouth daily.   ONE TOUCH  LANCETS Misc Check blood sugar once daily   PROBIOTIC PO Take 1 tablet by mouth daily.   psyllium 58.6 % powder Commonly known as: METAMUCIL Take 1 packet by mouth 4 (four) times a week.   Rybelsus 14 MG Tabs Generic drug: Semaglutide Take 1 tablet by mouth daily.   Vyvanse 50 MG capsule Generic drug: lisdexamfetamine Take 50 mg by mouth daily.           Objective:   Physical Exam BP (!) 136/92 (BP Location: Left Arm, Patient Position: Sitting, Cuff Size: Normal)   Pulse (!) 112   Temp 98.2 F (36.8 C) (Oral)   Resp 18   Ht 6' (1.829 m)   Wt (!) 341 lb (154.7 kg)   SpO2 98%   BMI 46.25 kg/m  General:   Well developed, NAD, BMI noted. HEENT:  Normocephalic . Face symmetric, atraumatic Lungs:  CTA B Normal respiratory effort,  no intercostal retractions, no accessory muscle use. Heart: RRR,  no murmur.  Lower extremities: no pretibial edema bilaterally  Skin: Not pale. Not jaundice Neurologic:  alert & oriented X3.  Speech normal, gait appropriate for age and unassisted Psych--  Cognition and judgment appear intact.  Cooperative with normal attention span and concentration.  Behavior appropriate. No anxious or depressed appearing.      Assessment    Assessment DM-- dx 2014, took invokana, metformin, dc ~06-2014 as diet improved, a1c 13 ( 08-2015), meds restarted HTN Dyslipidemia Gout Morbid obesity OSA ADHD- per other provider  Insomnia H/o diverticulitis, sigmoid,w/ perf, conservative Rx 10-2014 , Admitted again 08/2015, abscess, s/p drainage. Tobacco- wellbutrin intolerant   PLAN: DM: Last A1c 7.9, no med changes were recommended but advised to improve lifestyle which he is trying.  He is taking 20-minute walk twice weekly, is eating less fast foods.  Modest weight loss noted. Plan: Recheck A1c, continue Jardiance, metformin, Rybelsus.  I again emphasized that this is his opportunity to lose weight if you to the appropriate effort as he is taking 2 medications that will help. FH skin cancer: Patient has multiple skin lesions, referred to dermatology at his request which I think is reasonable Morbid obesity: Diet and exercise discussed RTC 4 months CPX   This visit occurred during the SARS-CoV-2 public health emergency.  Safety protocols were in place, including screening questions prior to the visit, additional usage of staff PPE, and extensive cleaning of exam room while observing appropriate contact time as indicated for disinfecting solutions.

## 2021-02-06 NOTE — Assessment & Plan Note (Signed)
DM: Last A1c 7.9, no med changes were recommended but advised to improve lifestyle which he is trying.  He is taking 20-minute walk twice weekly, is eating less fast foods.  Modest weight loss noted. Plan: Recheck A1c, continue Jardiance, metformin, Rybelsus.  I again emphasized that this is his opportunity to lose weight if you to the appropriate effort as he is taking 2 medications that will help. FH skin cancer: Patient has multiple skin lesions, referred to dermatology at his request which I think is reasonable Morbid obesity: Diet and exercise discussed RTC 4 months CPX

## 2021-02-06 NOTE — Patient Instructions (Addendum)
Happy Birthday!  We have placed a referral to an ophthalmologist for your diabetic eye exam. It is important that you start doing these at least once yearly to protect your vision. Please expect a call in the next several days to schedule an appointment.   Check the  blood pressure weekly  BP GOAL is between 110/65 and  135/85. If it is consistently higher or lower, let me know   Try to increase your physical activity to a walk every day  GO TO THE LAB : Get the blood work     [place], [place] back for a   physical exam in 4 months

## 2021-02-16 IMAGING — MR MR SHOULDER*L* W/O CM
5 series · 40 of 40 positions shown · non-contrast
Comparison: Plain films left shoulder 01/24/2020.

CLINICAL DATA: Left shoulder pain and limited range of motion for 1
year.

EXAM:
MRI OF THE LEFT SHOULDER WITHOUT CONTRAST
TECHNIQUE: Multiplanar, multisequence MR imaging of the shoulder was performed.
No intravenous contrast was administered.

[Series 4: PD fat-sat · axial · 4.0mm · 0.59mm/px · z∈[-53,+31]mm · 8 of 21 slices shown (1 of 2)]
[im 1/21]
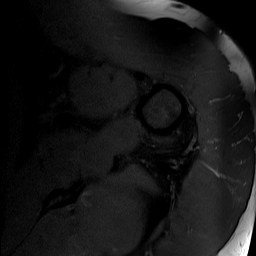
[im 3/21]
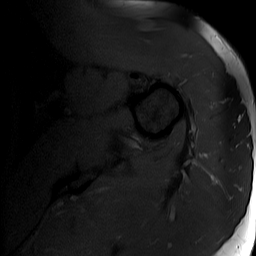
[im 6/21]
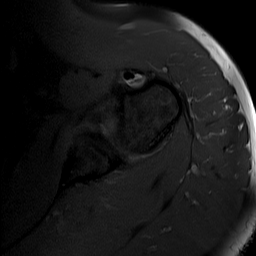
[im 9/21]
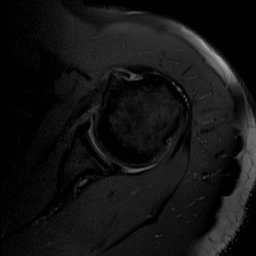
[im 12/21]
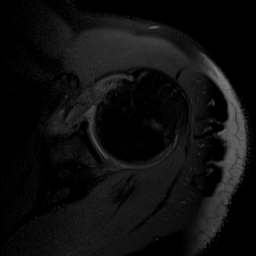
[im 15/21]
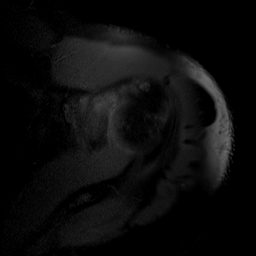
[im 18/21]
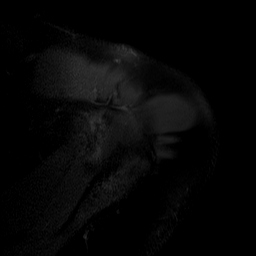
[im 21/21]
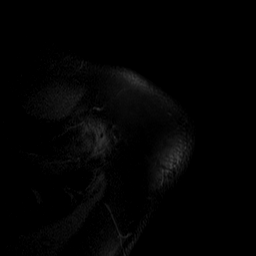

[Series 6: PD fat-sat · oblique · 4.0mm · 0.59mm/px · 7 of 19 slices shown (2 of 2)]
[im 1/19]
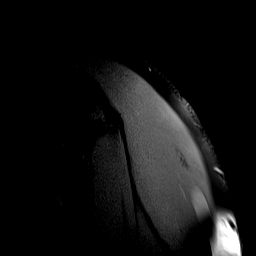
[im 4/19]
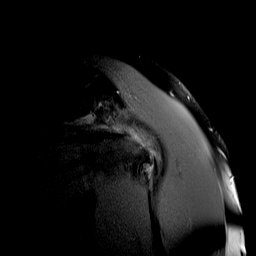
[im 7/19]
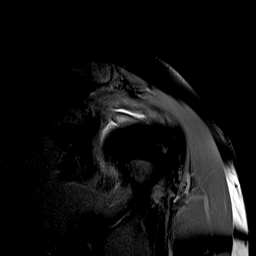
[im 10/19]
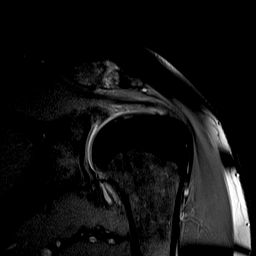
[im 13/19]
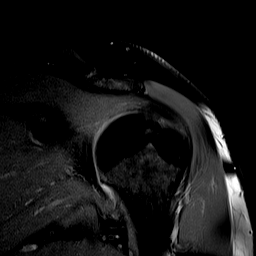
[im 16/19]
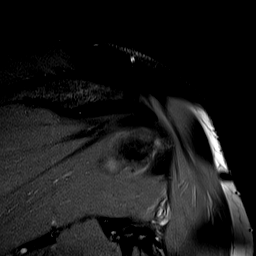
[im 19/19]
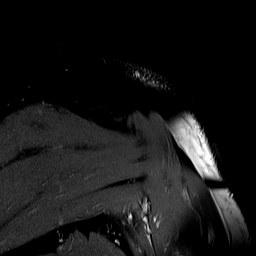

[Series 7: T1 · oblique · 4.0mm · 0.59mm/px · 9 of 23 slices shown]
[im 1/23]
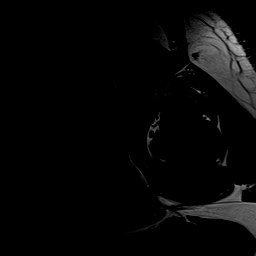
[im 3/23]
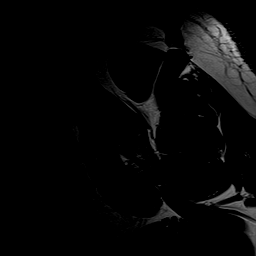
[im 6/23]
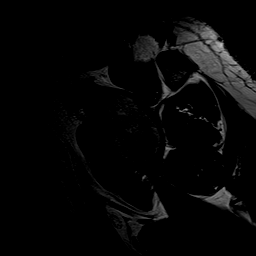
[im 9/23]
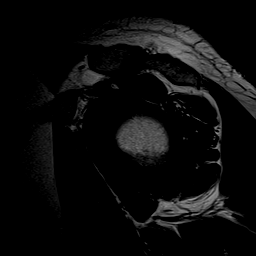
[im 12/23]
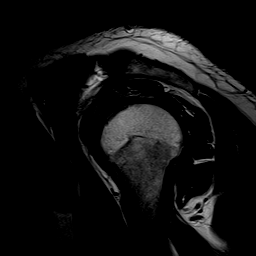
[im 14/23]
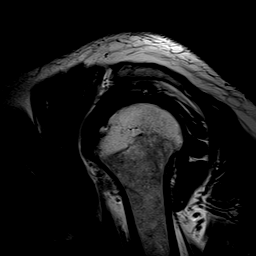
[im 17/23]
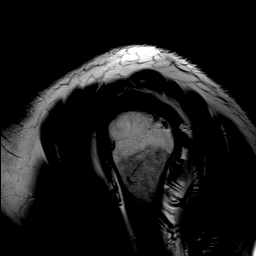
[im 20/23]
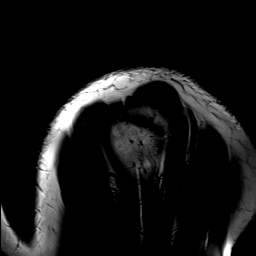
[im 23/23]
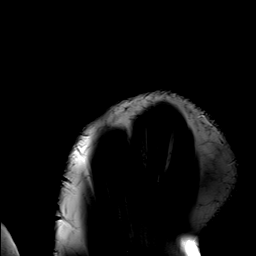

[Series 8: T2 fat-sat · oblique · 4.0mm · 0.59mm/px · 9 of 23 slices shown (1 of 2)]
[im 1/23]
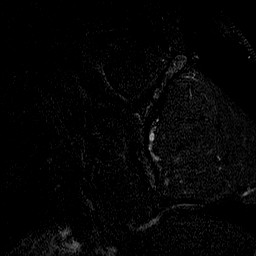
[im 3/23]
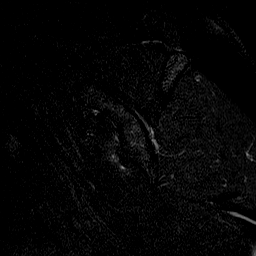
[im 6/23]
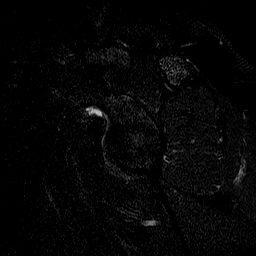
[im 9/23]
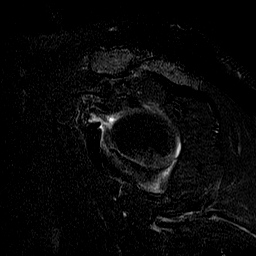
[im 12/23]
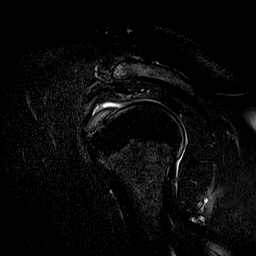
[im 14/23]
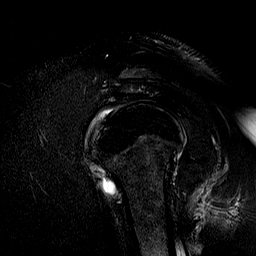
[im 17/23]
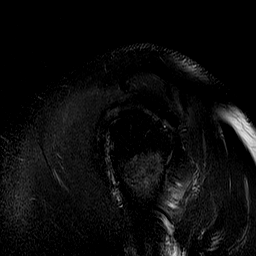
[im 20/23]
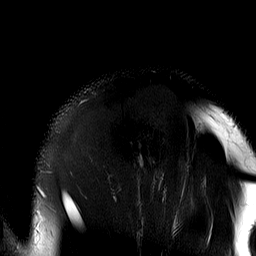
[im 23/23]
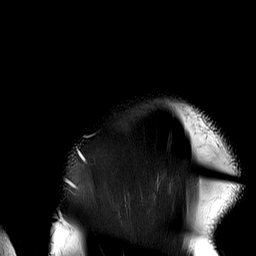

[Series 9: T2 fat-sat · oblique · 4.0mm · 0.62mm/px · 7 of 19 slices shown (2 of 2)]
[im 1/19]
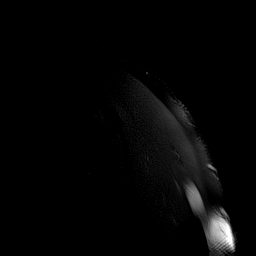
[im 4/19]
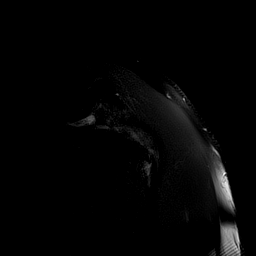
[im 7/19]
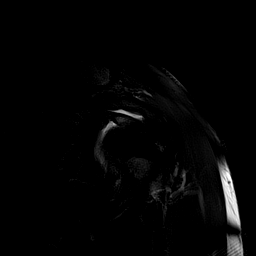
[im 10/19]
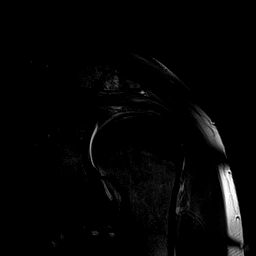
[im 13/19]
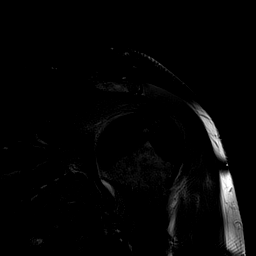
[im 16/19]
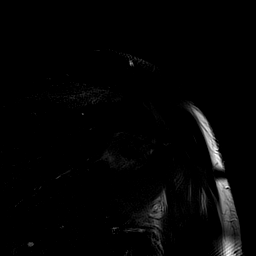
[im 19/19]
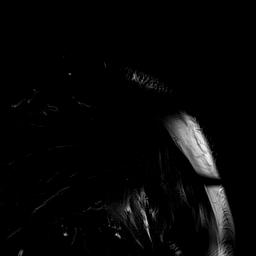

[40 of 40 positions shown; findings below may reference images not displayed]

FINDINGS: Rotator cuff: Intact. Rotator cuff tendinopathy is most notable in
the subscapularis and supraspinatus.

Muscles:  No atrophy or focal lesion.

Biceps long head: Intact. There is marked intrasubstance increased
T2 signal in the intra-articular segment of the tendon.

Acromioclavicular Joint: Moderate osteoarthritis. Type 2 acromion.
No subacromial/subdeltoid bursal fluid.

Glenohumeral Joint: Preserved.

Labrum: Marked fraying and degeneration of the superior labrum. The
superior labrum is likely torn at the biceps tendon attachment.

Bones:  No fracture, contusion or worrisome lesion.

Other: None.
IMPRESSION: Intact rotator cuff and long head of biceps. Tendinopathy appears
severe in the intra-articular long head of biceps, supraspinatus and
subscapularis.

Markedly frayed and irregular superior labrum at the biceps tendon
attachment with an appearance highly suggestive of tear.

Moderate acromioclavicular osteoarthritis.

## 2021-02-27 DIAGNOSIS — D485 Neoplasm of uncertain behavior of skin: Secondary | ICD-10-CM | POA: Diagnosis not present

## 2021-02-27 DIAGNOSIS — Z7984 Long term (current) use of oral hypoglycemic drugs: Secondary | ICD-10-CM | POA: Diagnosis not present

## 2021-02-27 DIAGNOSIS — D225 Melanocytic nevi of trunk: Secondary | ICD-10-CM | POA: Diagnosis not present

## 2021-02-27 DIAGNOSIS — E119 Type 2 diabetes mellitus without complications: Secondary | ICD-10-CM | POA: Diagnosis not present

## 2021-02-27 DIAGNOSIS — Z1283 Encounter for screening for malignant neoplasm of skin: Secondary | ICD-10-CM | POA: Diagnosis not present

## 2021-02-27 LAB — HM DIABETES EYE EXAM

## 2021-02-28 ENCOUNTER — Encounter: Payer: Self-pay | Admitting: Internal Medicine

## 2021-03-08 ENCOUNTER — Other Ambulatory Visit: Payer: Self-pay | Admitting: Internal Medicine

## 2021-03-11 ENCOUNTER — Other Ambulatory Visit: Payer: Self-pay | Admitting: Internal Medicine

## 2021-03-14 ENCOUNTER — Encounter: Payer: Self-pay | Admitting: Internal Medicine

## 2021-03-20 DIAGNOSIS — D485 Neoplasm of uncertain behavior of skin: Secondary | ICD-10-CM | POA: Diagnosis not present

## 2021-03-20 DIAGNOSIS — D2261 Melanocytic nevi of right upper limb, including shoulder: Secondary | ICD-10-CM | POA: Diagnosis not present

## 2021-03-20 DIAGNOSIS — D225 Melanocytic nevi of trunk: Secondary | ICD-10-CM | POA: Diagnosis not present

## 2021-04-09 DIAGNOSIS — F902 Attention-deficit hyperactivity disorder, combined type: Secondary | ICD-10-CM | POA: Diagnosis not present

## 2021-04-09 DIAGNOSIS — Z79899 Other long term (current) drug therapy: Secondary | ICD-10-CM | POA: Diagnosis not present

## 2021-05-11 ENCOUNTER — Other Ambulatory Visit: Payer: Self-pay | Admitting: Internal Medicine

## 2021-06-17 ENCOUNTER — Telehealth: Payer: BC Managed Care – PPO | Admitting: Emergency Medicine

## 2021-06-17 DIAGNOSIS — M5442 Lumbago with sciatica, left side: Secondary | ICD-10-CM

## 2021-06-17 MED ORDER — PREDNISONE 20 MG PO TABS: 20.0000 mg | ORAL_TABLET | Freq: Two times a day (BID) | ORAL | 0 refills | Status: AC

## 2021-06-17 MED ORDER — CYCLOBENZAPRINE HCL 10 MG PO TABS: 10.0000 mg | ORAL_TABLET | Freq: Every day | ORAL | 0 refills | Status: DC

## 2021-06-17 NOTE — Patient Instructions (Signed)
[name], thank you for joining Lestine Box, PA-C for today's virtual visit.  While this provider is not your primary care provider (PCP), if your PCP is located in our provider database this encounter information will be shared with them immediately following your visit.  Consent: (Patient) Spencer Good provided verbal consent for this virtual visit at the beginning of the encounter.  Current Medications:  Current Outpatient Medications:    cyclobenzaprine (FLEXERIL) 10 MG tablet, Take 1 tablet (10 mg total) by mouth at bedtime., Disp: 15 tablet, Rfl: 0   predniSONE (DELTASONE) 20 MG tablet, Take 1 tablet (20 mg total) by mouth 2 (two) times daily with a meal for 5 days., Disp: 10 tablet, Rfl: 0   aspirin EC 81 MG tablet, Take 81 mg by mouth daily., Disp: , Rfl:    atorvastatin (LIPITOR) 20 MG tablet, TAKE 1 TABLET(20 MG) BY MOUTH AT BEDTIME, Disp: 90 tablet, Rfl: 1   empagliflozin (JARDIANCE) 25 MG TABS tablet, TAKE 1 TABLET(25 MG) BY MOUTH DAILY, Disp: 90 tablet, Rfl: 1   fenofibrate 160 MG tablet, TAKE 1 TABLET(160 MG) BY MOUTH DAILY, Disp: 90 tablet, Rfl: 1   glucose blood (ONETOUCH VERIO) test strip, Check blood sugar once daily, Disp: 100 each, Rfl: 12   losartan (COZAAR) 100 MG tablet, TAKE 1 TABLET(100 MG) BY MOUTH DAILY, Disp: 90 tablet, Rfl: 1   metFORMIN (GLUCOPHAGE) 850 MG tablet, TAKE 1 TABLET BY MOUTH WITH BREAKFAST, 1 TABLET WITH LUNCH, AND 1 TABLET WITH EVENING MEAL, Disp: 270 tablet, Rfl: 1   Multiple Vitamin (MULTIVITAMIN) tablet, Take 1 tablet by mouth daily. , Disp: , Rfl:    ONE TOUCH LANCETS MISC, Check blood sugar once daily, Disp: 100 each, Rfl: 12   Probiotic Product (PROBIOTIC PO), Take 1 tablet by mouth daily. , Disp: , Rfl:    psyllium (METAMUCIL) 58.6 % powder, Take 1 packet by mouth 4 (four) times a week. , Disp: , Rfl:    Semaglutide (RYBELSUS) 14 MG TABS, TAKE 1 TABLET BY MOUTH DAILY, Disp: 90 tablet, Rfl: 1   VYVANSE 50 MG capsule, Take 50 mg by  mouth daily., Disp: , Rfl:    Medications ordered in this encounter:  Meds ordered this encounter  Medications   predniSONE (DELTASONE) 20 MG tablet    Sig: Take 1 tablet (20 mg total) by mouth 2 (two) times daily with a meal for 5 days.    Dispense:  10 tablet    Refill:  0    Order Specific Question:   Supervising Provider    Answer:   Sabra Heck, BRIAN [[id number]]   cyclobenzaprine (FLEXERIL) 10 MG tablet    Sig: Take 1 tablet (10 mg total) by mouth at bedtime.    Dispense:  15 tablet    Refill:  0    Order Specific Question:   Supervising Provider    Answer:   Sabra Heck, BRIAN [[id number]]     *If you need refills on other medications prior to your next appointment, please contact your pharmacy*  Follow-Up: Call back or seek an in-person evaluation if the symptoms worsen or if the condition fails to improve as anticipated.  Other Instructions Continue conservative management of rest, ice, and gentle stretches Prescribed prednisone Take cyclobenzaprine at nighttime for symptomatic relief. Avoid driving or operating heavy machinery while using medication. Follow up with PCP if symptoms persist Present to urgent care or ER if worsening or new symptoms (fever, chills, chest pain, abdominal pain, changes in bowel  or bladder habits, pain radiating into lower legs, etc...)    If you have been instructed to have an in-person evaluation today at a local Urgent Care facility, please use the link below. It will take you to a list of all of our available [facility], including address, phone number and hours of operation. Please do not delay care.  [facility]  If you or a family member do not have a primary care provider, use the link below to schedule a visit and establish care. When you choose a [facility] primary care physician or advanced practice provider, you gain a long-term partner in health. Find a Primary Care Provider  Learn more about [facility]'s in-office and  virtual care options: [facility]

## 2021-06-17 NOTE — Progress Notes (Signed)
Virtual Visit Consent   Spencer Good, you are scheduled for a virtual visit with a [facility] provider today.     Just as with appointments in the office, your consent must be obtained to participate.  Your consent will be active for this visit and any virtual visit you may have with one of our providers in the next 365 days.     If you have a MyChart account, a copy of this consent can be sent to you electronically.  All virtual visits are billed to your insurance company just like a traditional visit in the office.    As this is a virtual visit, video technology does not allow for your provider to perform a traditional examination.  This may limit your provider's ability to fully assess your condition.  If your provider identifies any concerns that need to be evaluated in person or the need to arrange testing (such as labs, EKG, etc.), we will make arrangements to do so.     Although advances in technology are sophisticated, we cannot ensure that it will always work on either your end or our end.  If the connection with a video visit is poor, the visit may have to be switched to a telephone visit.  With either a video or telephone visit, we are not always able to ensure that we have a secure connection.     I need to obtain your verbal consent now.   Are you willing to proceed with your visit today? Yes   Spencer Good has provided verbal consent on 06/17/2021 for a virtual visit (video or telephone).   Lestine Box, [place]   Date: 06/17/2021 4:34 PM   Virtual Visit via Video Note   I, Lestine Box, connected with  Spencer Good  ([id number], 13-Oct-1975) on 06/17/21 at  4:30 PM EST by a video-enabled telemedicine application and verified that I am speaking with the correct person using two identifiers.  Location: Patient: Virtual Visit Location Patient: Home Provider: Virtual Visit Location Provider: Home Office   I discussed the limitations of evaluation and management by  telemedicine and the availability of in person appointments. The patient expressed understanding and agreed to proceed.    History of Present Illness: Spencer Good is a 46 y.o. who identifies as a male who was assigned male at birth, and is being seen today for back/ buttock pain x 1 week, worsening over the past day.  He denies a precipitating event or specific injury.  Pain is intermittent.  Patient has tried stretching without relief.    Denies aggravating factors.  He denies similar symptoms in the past.   Denies fever, nausea, vomiting, saddle paresthesias, loss of bowel or bladder function.    HPI: HPI  Problems:  Patient Active Problem List   Diagnosis Date Noted   Obstructive sleep apnea 10/05/2019   Dyslipidemia 07/09/2019   Annual physical exam 02/02/2019   Diverticulitis of sigmoid colon 05/24/2016   Morbid obesity ([place]) 04/25/2016   ADD (attention deficit disorder) 12/28/2015   Diverticulitis of large intestine with abscess without bleeding 09/13/2015   PCP NOTES >>>>>>>>>>>>>>>>>>>> 09/07/2015   Elevated WBCs 01/27/2014   Diabetes ([place]) 02/17/2013   Tachycardia 08/29/2011   TOBACCO USER 06/29/2010    Allergies: No Known Allergies Medications:  Current Outpatient Medications:    aspirin EC 81 MG tablet, Take 81 mg by mouth daily., Disp: , Rfl:    atorvastatin (LIPITOR) 20 MG tablet, TAKE 1 TABLET(20 MG) BY  MOUTH AT BEDTIME, Disp: 90 tablet, Rfl: 1   empagliflozin (JARDIANCE) 25 MG TABS tablet, TAKE 1 TABLET(25 MG) BY MOUTH DAILY, Disp: 90 tablet, Rfl: 1   fenofibrate 160 MG tablet, TAKE 1 TABLET(160 MG) BY MOUTH DAILY, Disp: 90 tablet, Rfl: 1   glucose blood (ONETOUCH VERIO) test strip, Check blood sugar once daily, Disp: 100 each, Rfl: 12   losartan (COZAAR) 100 MG tablet, TAKE 1 TABLET(100 MG) BY MOUTH DAILY, Disp: 90 tablet, Rfl: 1   metFORMIN (GLUCOPHAGE) 850 MG tablet, TAKE 1 TABLET BY MOUTH WITH BREAKFAST, 1 TABLET WITH LUNCH, AND 1 TABLET WITH EVENING MEAL, Disp:  270 tablet, Rfl: 1   Multiple Vitamin (MULTIVITAMIN) tablet, Take 1 tablet by mouth daily. , Disp: , Rfl:    ONE TOUCH LANCETS MISC, Check blood sugar once daily, Disp: 100 each, Rfl: 12   Probiotic Product (PROBIOTIC PO), Take 1 tablet by mouth daily. , Disp: , Rfl:    psyllium (METAMUCIL) 58.6 % powder, Take 1 packet by mouth 4 (four) times a week. , Disp: , Rfl:    Semaglutide (RYBELSUS) 14 MG TABS, TAKE 1 TABLET BY MOUTH DAILY, Disp: 90 tablet, Rfl: 1   VYVANSE 50 MG capsule, Take 50 mg by mouth daily., Disp: , Rfl:   Observations/Objective: Patient is well-developed, well-nourished in no acute distress.  Resting comfortably  at home.  Head is normocephalic, atraumatic.  No labored breathing.  Speech is clear and coherent with logical content.  Patient is alert and oriented at baseline.    Assessment and Plan: 1. Acute left-sided low back pain with left-sided sciatica  Continue conservative management of rest, ice, and gentle stretches Prescribed prednisone Take cyclobenzaprine at nighttime for symptomatic relief. Avoid driving or operating heavy machinery while using medication. Follow up with PCP if symptoms persist Present to urgent care or ER if worsening or new symptoms (fever, chills, chest pain, abdominal pain, changes in bowel or bladder habits, pain radiating into lower legs, etc...)   Follow Up Instructions: I discussed the assessment and treatment plan with the patient. The patient was provided an opportunity to ask questions and all were answered. The patient agreed with the plan and demonstrated an understanding of the instructions.  A copy of instructions were sent to the patient via MyChart unless otherwise noted below.    The patient was advised to call back or seek an in-person evaluation if the symptoms worsen or if the condition fails to improve as anticipated.  Time:  I spent 15 minutes with the patient via telehealth technology discussing the above  problems/concerns.    Lestine Box, PA-C

## 2021-06-19 NOTE — Progress Notes (Signed)
° °  I, Wendy Poet, LAT, ATC, am serving as scribe for Dr. Lynne Leader.  Spencer Good is a 46 y.o. male who presents to [place] at [facility] today for low back pain w/ radiating pain into his L leg x approximately one week w no known MOI.  He was seen virtually on 06/17/21 for these c/o and was prescribed Flexeril and prednisone.  He locates his pain to his L lower back and buttocks.  He states that his low back and L leg pain has improved over the last few days.  Radiating pain: previously yes into his L post thigh but not currently LE numbness/tingling: yes intermittently but not currently Aggravating factors: standing; urination; taking care of his dogs Treatments tried: hyrdocodone; flexeril; prednisone   Pertinent review of systems: No fevers or chills  Relevant historical information: Abdominal surgery in 2017.   Exam:  BP 120/78 (BP Location: Right Arm, Patient Position: Sitting, Cuff Size: Large)    Pulse 91    Ht 6' (1.829 m)    Wt (!) 336 lb 9.6 oz (152.7 kg)    SpO2 94%    BMI 45.65 kg/m  General: Well Developed, well nourished, and in no acute distress.   MSK: L-spine nontender midline.  Decreased range of motion to flexion otherwise normal. Lower extremity strength sensation and reflexes are intact. Positive left-sided slump test mild.     Lab and Radiology Results  Straight images L-spine obtained today personally and independently interpreted. Images compared to L-spine images visible on CT scan abdomen and pelvis from December 2017 DDD L5-S1.  No acute fractures. Await formal radiology review    Assessment and Plan: 46 y.o. male with low back pain with left S1 radiculopathy improving with prednisone and Flexeril.  Prednisone and Flexeril prescribed by telemedicine provider on January 1.  His symptoms were quite severe prior to this however.  Plan for x-ray today and a bit of watchful waiting.  If his symptoms should return or worsen may  consider physical therapy or even MRI.  We will be happy to refill the Flexeril or potentially the prednisone in the future.   PDMP not reviewed this encounter. Orders Placed This Encounter  Procedures   DG Lumbar Spine 2-3 Views    Standing Status:   Future    Number of Occurrences:   1    Standing Expiration Date:   07/21/2021    Order Specific Question:   Reason for Exam (SYMPTOM  OR DIAGNOSIS REQUIRED)    Answer:   low back pain    Order Specific Question:   Preferred imaging location?    Answer:   [facility]   No orders of the defined types were placed in this encounter.    Discussed warning signs or symptoms. Please see discharge instructions. Patient expresses understanding.   The above documentation has been reviewed and is accurate and complete Lynne Leader, M.D.

## 2021-06-20 ENCOUNTER — Ambulatory Visit: Payer: BC Managed Care – PPO | Admitting: Family Medicine

## 2021-06-20 ENCOUNTER — Other Ambulatory Visit: Payer: Self-pay

## 2021-06-20 ENCOUNTER — Ambulatory Visit (INDEPENDENT_AMBULATORY_CARE_PROVIDER_SITE_OTHER): Payer: BC Managed Care – PPO

## 2021-06-20 ENCOUNTER — Encounter: Payer: Self-pay | Admitting: Family Medicine

## 2021-06-20 VITALS — BP 120/78 | HR 91 | Ht 72.0 in | Wt 336.6 lb

## 2021-06-20 DIAGNOSIS — M5442 Lumbago with sciatica, left side: Secondary | ICD-10-CM

## 2021-06-20 DIAGNOSIS — M5136 Other intervertebral disc degeneration, lumbar region: Secondary | ICD-10-CM | POA: Diagnosis not present

## 2021-06-20 NOTE — Progress Notes (Signed)
X-ray lumbar spine shows a little bit of arthritis changes in the lumbar spine

## 2021-06-20 NOTE — Patient Instructions (Addendum)
Good to see you today.  Please get an Xray today before you leave.  Follow-up: as needed

## 2021-07-03 ENCOUNTER — Encounter: Payer: Self-pay | Admitting: Internal Medicine

## 2021-07-03 ENCOUNTER — Ambulatory Visit (INDEPENDENT_AMBULATORY_CARE_PROVIDER_SITE_OTHER): Payer: BC Managed Care – PPO | Admitting: Internal Medicine

## 2021-07-03 VITALS — BP 126/86 | HR 97 | Temp 97.7°F | Resp 18 | Ht 72.0 in | Wt 341.4 lb

## 2021-07-03 DIAGNOSIS — E1165 Type 2 diabetes mellitus with hyperglycemia: Secondary | ICD-10-CM | POA: Diagnosis not present

## 2021-07-03 DIAGNOSIS — E785 Hyperlipidemia, unspecified: Secondary | ICD-10-CM | POA: Diagnosis not present

## 2021-07-03 DIAGNOSIS — Z0000 Encounter for general adult medical examination without abnormal findings: Secondary | ICD-10-CM | POA: Diagnosis not present

## 2021-07-03 LAB — LIPID PANEL
Cholesterol: 175 mg/dL (ref 0–200)
HDL: 26.6 mg/dL — ABNORMAL LOW (ref >39.00)
Total CHOL/HDL Ratio: 7
Triglycerides: 638 mg/dL — ABNORMAL HIGH (ref 0.0–149.0)

## 2021-07-03 LAB — CBC WITH DIFFERENTIAL/PLATELET
Basophils Absolute: 0.1 10*3/uL (ref 0.0–0.1)
Basophils Relative: 0.7 % (ref 0.0–3.0)
Eosinophils Absolute: 0.2 10*3/uL (ref 0.0–0.7)
Eosinophils Relative: 2.3 % (ref 0.0–5.0)
HCT: 44.8 % (ref 39.0–52.0)
Hemoglobin: 14.6 g/dL (ref 13.0–17.0)
Lymphocytes Relative: 33.5 % (ref 12.0–46.0)
Lymphs Abs: 2.9 10*3/uL (ref 0.7–4.0)
MCHC: 32.6 g/dL (ref 30.0–36.0)
MCV: 83.8 fl (ref 78.0–100.0)
Monocytes Absolute: 0.7 10*3/uL (ref 0.1–1.0)
Monocytes Relative: 8 % (ref 3.0–12.0)
Neutro Abs: 4.8 10*3/uL (ref 1.4–7.7)
Neutrophils Relative %: 55.5 % (ref 43.0–77.0)
Platelets: 244 10*3/uL (ref 150.0–400.0)
RBC: 5.35 Mil/uL (ref 4.22–5.81)
RDW: 14.7 % (ref 11.5–15.5)
WBC: 8.7 10*3/uL (ref 4.0–10.5)

## 2021-07-03 LAB — COMPREHENSIVE METABOLIC PANEL
ALT: 31 U/L (ref 0–53)
AST: 17 U/L (ref 0–37)
Albumin: 4.7 g/dL (ref 3.5–5.2)
Alkaline Phosphatase: 59 U/L (ref 39–117)
BUN: 18 mg/dL (ref 6–23)
CO2: 26 mEq/L (ref 19–32)
Calcium: 10 mg/dL (ref 8.4–10.5)
Chloride: 101 mEq/L (ref 96–112)
Creatinine, Ser: 1.03 mg/dL (ref 0.40–1.50)
GFR: 89.04 mL/min (ref >60.00)
Glucose, Bld: 132 mg/dL — ABNORMAL HIGH (ref 70–99)
Potassium: 4.3 mEq/L (ref 3.5–5.1)
Sodium: 138 mEq/L (ref 135–145)
Total Bilirubin: 0.4 mg/dL (ref 0.2–1.2)
Total Protein: 7.2 g/dL (ref 6.0–8.3)

## 2021-07-03 LAB — HEMOGLOBIN A1C: Hgb A1c MFr Bld: 7.7 % — ABNORMAL HIGH (ref 4.6–6.5)

## 2021-07-03 LAB — LDL CHOLESTEROL, DIRECT: Direct LDL: 81 mg/dL

## 2021-07-03 LAB — TSH: TSH: 2.26 u[IU]/mL (ref 0.35–5.50)

## 2021-07-03 NOTE — Progress Notes (Signed)
Subjective:    Patient ID: Spencer Good, male    DOB: [birth year], 46 y.o.   MRN: [id number]  DOS:  07/03/2021 Type of visit - description: CPX  Here for CPX Did not do well for 2 months regards diet and exercise. Develop sciatic pain, saw another doctor, that is resolved. Does not check ambulatory BPs are CBGs. Does not use a CPAP   Wt Readings from Last 3 Encounters:  07/03/21 (!) 341 lb 6 oz (154.8 kg)  06/20/21 (!) 336 lb 9.6 oz (152.7 kg)  02/06/21 (!) 341 lb (154.7 kg)     Review of Systems  Other than above, a 14 point review of systems is negative       Past Medical History:  Diagnosis Date   ADHD (attention deficit hyperactivity disorder)    Diabetes ([place])    Diverticulitis of colon with perforation    s/p sigmoid colectomy   Gout    Hernia, umbilical 4259   Laparoscopic umbilical hernia repair with mesh- s/p repair-resolved.   History of colon polyps    Hyperlipidemia    Hypertension    Insomnia    Obesity    OSA (obstructive sleep apnea) dx 05-2013   Rx a Cpap 3-15, can't use - no longer has machine.   Sleep apnea    doesn't use CPAP    Past Surgical History:  Procedure Laterality Date   COLONOSCOPY     EYE SURGERY     INSERTION OF MESH N/A 02/01/2015   Procedure: INSERTION OF MESH;  Surgeon: Rolm Bookbinder, MD;  Location: [facility];  Service: General;  Laterality: N/A;   LAPAROSCOPIC SIGMOID COLECTOMY N/A 05/24/2016   Procedure: LAPAROSCOPIC SIGMOID COLECTOMY;  Surgeon: Excell Seltzer, MD;  Location: [facility];  Service: General;  Laterality: N/A;   LASIK Bilateral    SHOULDER ARTHROSCOPY Left 04/24/2020   Procedure: LEFT SHOULDER MANIPULATION UNDER ANESTHESIA, ROTATOR INTERVAL RELEASE, DEBRIDEMENT, BICEPS TENODESIS;  Surgeon: Meredith Pel, MD;  Location: [place];  Service: Orthopedics;  Laterality: Left;   UMBILICAL HERNIA REPAIR N/A 02/01/2015   Procedure: LAPAROSCOPIC UMBILICAL HERNIA REPAIR WITH MESH;  Surgeon: Rolm Bookbinder, MD;   Location: [facility];  Service: General;  Laterality: N/A;   Social History   Socioeconomic History   Marital status: Single    Spouse name: Not on file   Number of children: 0   Years of education: Not on file   Highest education level: Not on file  Occupational History   Occupation: unemployed  Tobacco Use   Smoking status: Former    Packs/day: 1.00    Years: 20.00    Pack years: 20.00    Types: Cigarettes    Start date: 07/30/1992    Quit date: 12/16/2014    Years since quitting: 6.5   Smokeless tobacco: Never   Tobacco comments:    + vaping , no cigarrets   Vaping Use   Vaping Use: Every day  Substance and Sexual Activity   Alcohol use: Yes    Alcohol/week: 0.0 standard drinks    Comment: 09/08/2015 "I'll have a few drinks/year"   Drug use: No    Comment: Past  experimented some in college; not a regular user when I did use-none over 15 yrs   Sexual activity: Not Currently  Other Topics Concern   Not on file  Social History Narrative   Lives by himself    Social Determinants of Health   Financial Resource Strain: Not on file  Food  Insecurity: Not on file  Transportation Needs: Not on file  Physical Activity: Not on file  Stress: Not on file  Social Connections: Not on file  Intimate Partner Violence: Not on file    Current Outpatient Medications  Medication Instructions   aspirin EC 81 mg, Oral, Daily   atorvastatin (LIPITOR) 20 MG tablet TAKE 1 TABLET(20 MG) BY MOUTH AT BEDTIME   empagliflozin (JARDIANCE) 25 MG TABS tablet TAKE 1 TABLET(25 MG) BY MOUTH DAILY   fenofibrate 160 MG tablet TAKE 1 TABLET(160 MG) BY MOUTH DAILY   glucose blood (ONETOUCH VERIO) test strip Check blood sugar once daily   losartan (COZAAR) 100 MG tablet TAKE 1 TABLET(100 MG) BY MOUTH DAILY   metFORMIN (GLUCOPHAGE) 850 MG tablet TAKE 1 TABLET BY MOUTH WITH BREAKFAST, 1 TABLET WITH LUNCH, AND 1 TABLET WITH EVENING MEAL   Multiple Vitamin (MULTIVITAMIN) tablet 1 tablet, Oral, Daily   ONE  TOUCH LANCETS MISC Check blood sugar once daily   Probiotic Product (PROBIOTIC PO) 1 tablet, Oral, Daily   psyllium (METAMUCIL) 58.6 % powder 1 packet, Oral, 4 times weekly   Semaglutide (RYBELSUS) 14 MG TABS 1 tablet, Oral, Daily   Vyvanse 50 mg, Oral, Daily       Objective:   Physical Exam BP 126/86 (BP Location: Left Arm, Patient Position: Sitting, Cuff Size: Normal)    Pulse 97    Temp 97.7 F (36.5 C) (Oral)    Resp 18    Ht 6' (1.829 m)    Wt (!) 341 lb 6 oz (154.8 kg)    SpO2 96%    BMI 46.30 kg/m  General: Well developed, NAD, BMI noted Neck: No  thyromegaly  HEENT:  Normocephalic . Face symmetric, atraumatic Lungs:  CTA B Normal respiratory effort, no intercostal retractions, no accessory muscle use. Heart: RRR,  no murmur.  Abdomen:  Not distended, soft, non-tender. No rebound or rigidity.   Lower extremities: no pretibial edema bilaterally  Skin: Exposed areas without rash. Not pale. Not jaundice Neurologic:  alert & oriented X3.  Speech normal, gait appropriate for age and unassisted Strength symmetric and appropriate for age.  Psych: Cognition and judgment appear intact.  Cooperative with normal attention span and concentration.  Behavior appropriate. No anxious or depressed appearing.     Assessment     Assessment DM-- dx 2014, took invokana, metformin, dc ~06-2014 as diet improved, a1c 13 ( 08-2015), meds restarted HTN Dyslipidemia Gout Morbid obesity OSA - not using cpap as off 06-2021, intolerant. ADHD- per other provider  Insomnia H/o diverticulitis, sigmoid,w/ perf, conservative Rx 10-2014 , Admitted again 08/2015, abscess, s/p drainage. Tobacco- wellbutrin intolerant   PLAN: Here for CPX DM: For 2 months, did not eat well or exercise ("eat like hell"), trying to get back on a better routine.  No ambulatory CBGs.  Continue Jardiance, metformin, Rybelsus.  Check A1c.  Consider Endo referral. HTN: BP looks good today, continue losartan.  No ambulatory  BPs. Dyslipidemia: On Lipitor and fenofibrate.  Checking labs. Morbid obesity: Gaining weight again, again advised that he really needs to put a lot of personal effort on losing weight.  Referred to the wellness clinic. OSA: Intolerant to CPAP, recommend to keep trying, states he wont.  He is simply intolerant FH skin cancer: Since the last visit, reportedly saw dermatology, had 3 lesions taken off his back. Nicotine abuse, vaping: Counseled.  RTC 4 months.     This visit occurred during the SARS-CoV-2 public health emergency.  Safety  Safety protocols were in place, including screening questions prior to the visit, additional usage of staff PPE, and extensive cleaning of exam room while observing appropriate contact time as indicated for disinfecting solutions.

## 2021-07-03 NOTE — Assessment & Plan Note (Signed)
Here for CPX DM: For 2 months, did not eat well or exercise ("eat like hell"), trying to get back on a better routine.  No ambulatory CBGs.  Continue Jardiance, metformin, Rybelsus.  Check A1c.  Consider Endo referral. HTN: BP looks good today, continue losartan.  No ambulatory BPs. Dyslipidemia: On Lipitor and fenofibrate.  Checking labs. Morbid obesity: Gaining weight again, again advised that he really needs to put a lot of personal effort on losing weight.  Referred to the wellness clinic. OSA: Intolerant to CPAP, recommend to keep trying, states he wont.  He is simply intolerant FH skin cancer: Since the last visit, reportedly saw dermatology, had 3 lesions taken off his back. Nicotine abuse, vaping: Counseled.  RTC 4 months.

## 2021-07-03 NOTE — Assessment & Plan Note (Signed)
-  Td 06-2020 -PNM 23: 2016 - PNM 13 2017 -COVID vaccines: UTD - Had flu shot - cscope 12/2014, polyp, 5 years, C-scope 04/07/2020, next per GI , Dr Henrene Pastor.    -Labs: CMP, FLP, CBC, A1c, TSH -Diet and exercise: discussed  -ACP in chart

## 2021-07-03 NOTE — Patient Instructions (Signed)
Check the  blood pressure regularly BP GOAL is between 110/65 and  135/85. If it is consistently higher or lower, let me know   Diabetes: Check your blood sugar at different times  - early in AM fasting  ( goal 70-130) - 2 hours after a meal (goal less than 180 - bedtime (goal 90-150)    GO TO THE LAB : Get the blood work     [place], [employer] back for a checkup in 4 months

## 2021-07-04 NOTE — Progress Notes (Signed)
I, Wendy Poet, LAT, ATC, am serving as scribe for Dr. Lynne Leader.  Spencer Good is a 46 y.o. male who presents to [place] at [facility] today for R arm pain.  He was last seen by Dr. Georgina Snell on 06/20/21 for LBP w/ L LE radicular pain.  Today, pt reports R arm pain ongoing since 1/5-1/6.  He locates his pain to the posterior aspect of the R shoulder, lateral border of the R scapula, w/ radiating pain to mid-forearm, ulnar aspect. 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  Radiating pain: yes Neck pain: yes- bilat R UE numbness/tingling: no Aggravating factors: nothing in particular Treatments tried: IBU, Tylenol, TENS unit, elbow sleeve w/ pad   Pertinent review of systems: No fevers or chills  Relevant historical information: Prior sciatica.  Prior left shoulder rotator cuff problem.  Diabetes.   Exam:  BP 132/88    Pulse 94    Ht 6' (1.829 m)    Wt (!) 321 lb 6.4 oz (145.8 kg)    SpO2 97%    BMI 43.59 kg/m  General: Well Developed, well nourished, and in no acute distress.   MSK: C-spine: Normal-appearing Nontender midline. Normal cervical motion. Mildly positive right-sided Spurling's test. Number extremity strength is intact. Reflexes are intact. Sensation is intact.  Right shoulder normal-appearing Mildly tender palpation right trapezius. Normal shoulder motion. Negative impingement testing. Intact strength.  Right elbow normal.  Nontender lateral elbow at lateral epicondyle.  Minimal pain with resisted wrist and finger extension.  No pain with at lateral elbow with grip strength testing.    Lab and Radiology Results  X-ray images C-spine obtained today personally and independently interpreted Loss of cervical lordosis indicating spasm. Minimal DDD. No acute fractures. Await formal radiology review     Assessment and Plan: 46 y.o. male with right arm pain.  Pain is most likely C7 radiculopathy.  Although it is  possible that he has some isolated right trapezius spasm and right lateral epicondylitis.  This second issue is less likely. Plan for trial of prednisone and gabapentin and physical therapy.  Recheck in about 6 weeks.  If not improved consider cervical spine MRI for investigation of other issues if those are more likely.   PDMP not reviewed this encounter. Orders Placed This Encounter  Procedures   DG Cervical Spine Complete    Standing Status:   Future    Number of Occurrences:   1    Standing Expiration Date:   07/05/2022    Order Specific Question:   Reason for Exam (SYMPTOM  OR DIAGNOSIS REQUIRED)    Answer:   cervical radiculopathy    Order Specific Question:   Preferred imaging location?    Answer:   [facility]   Ambulatory referral to Physical Therapy    Referral Priority:   Routine    Referral Type:   Physical Medicine    Referral Reason:   Specialty Services Required    Requested Specialty:   Physical Therapy    Number of Visits Requested:   1   Meds ordered this encounter  Medications   gabapentin (NEURONTIN) 300 MG capsule    Sig: Take 1 capsule (300 mg total) by mouth 3 (three) times daily.    Dispense:  30 capsule    Refill:  3   predniSONE (DELTASONE) 50 MG tablet    Sig: Take 1 pill daily for 5 days    Dispense:  5  tablet    Refill:  0     Discussed warning signs or symptoms. Please see discharge instructions. Patient expresses understanding.   The above documentation has been reviewed and is accurate and complete Lynne Leader, M.D.

## 2021-07-05 ENCOUNTER — Other Ambulatory Visit: Payer: Self-pay

## 2021-07-05 ENCOUNTER — Ambulatory Visit: Payer: BC Managed Care – PPO | Admitting: Family Medicine

## 2021-07-05 ENCOUNTER — Ambulatory Visit (INDEPENDENT_AMBULATORY_CARE_PROVIDER_SITE_OTHER): Payer: BC Managed Care – PPO

## 2021-07-05 VITALS — BP 132/88 | HR 94 | Ht 72.0 in | Wt 321.4 lb

## 2021-07-05 DIAGNOSIS — M5412 Radiculopathy, cervical region: Secondary | ICD-10-CM

## 2021-07-05 MED ORDER — PREDNISONE 50 MG PO TABS: ORAL_TABLET | ORAL | 0 refills | Status: DC

## 2021-07-05 MED ORDER — GABAPENTIN 300 MG PO CAPS: 300.0000 mg | ORAL_CAPSULE | Freq: Three times a day (TID) | ORAL | 3 refills | Status: DC

## 2021-07-05 NOTE — Patient Instructions (Addendum)
Thank you for coming in today.   I've sent the Gabapentin and prednisone to your pharmacy  Please get an Xray today before you leave   I've referred you to Physical Therapy.  Let us know if you don't hear from them in one week.   Recheck back in 6-8 weeks.

## 2021-07-05 NOTE — Addendum Note (Signed)
Addended byDamita Dunnings D on: 07/05/2021 04:12 PM   Modules accepted: Orders

## 2021-07-06 NOTE — Progress Notes (Signed)
X-ray cervical spine shows potential for nerves to get pinched.  An MRI would tell us more in the future if needed.

## 2021-07-09 ENCOUNTER — Ambulatory Visit: Payer: BC Managed Care – PPO | Admitting: Physical Therapy

## 2021-07-11 ENCOUNTER — Encounter: Payer: Self-pay | Admitting: Physical Therapy

## 2021-07-11 ENCOUNTER — Other Ambulatory Visit: Payer: Self-pay

## 2021-07-11 ENCOUNTER — Ambulatory Visit: Payer: BC Managed Care – PPO | Admitting: Physical Therapy

## 2021-07-11 DIAGNOSIS — M5412 Radiculopathy, cervical region: Secondary | ICD-10-CM

## 2021-07-11 DIAGNOSIS — M25511 Pain in right shoulder: Secondary | ICD-10-CM

## 2021-07-11 NOTE — Patient Instructions (Addendum)
Access Code: [facility]

## 2021-07-11 NOTE — Therapy (Signed)
[facility] Physical Therapy [address] [place], [place], [address] Phone: [phone]   Fax:  [phone]  Physical Therapy Evaluation   Patient Details  Name: Spencer Good MRN: [id number] Date of Birth: 12-Oct-1975 Referring Provider (PT): [name], MD   Encounter Date: 07/11/2021   PT End of Session - 07/11/21 1030     Visit Number 1    Number of Visits 12    Date for PT Re-Evaluation 08/22/21    PT Start Time 5361    PT Stop Time 0933    PT Time Calculation (min) 46 min    Activity Tolerance Patient tolerated treatment well    Behavior During Therapy [facility] for tasks assessed/performed             Past Medical History:  Diagnosis Date   ADHD (attention deficit hyperactivity disorder)    Diabetes ([place])    Diverticulitis of colon with perforation    s/p sigmoid colectomy   Gout    Hernia, umbilical 4431   Laparoscopic umbilical hernia repair with mesh- s/p repair-resolved.   History of colon polyps    Hyperlipidemia    Hypertension    Insomnia    Obesity    OSA (obstructive sleep apnea) dx 05-2013   Rx a Cpap 3-15, can't use - no longer has machine.   Sleep apnea    doesn't use CPAP    Past Surgical History:  Procedure Laterality Date   COLONOSCOPY     EYE SURGERY     INSERTION OF MESH N/A 02/01/2015   Procedure: INSERTION OF MESH;  Surgeon: Rolm Bookbinder, MD;  Location: [place];  Service: General;  Laterality: N/A;   LAPAROSCOPIC SIGMOID COLECTOMY N/A 05/24/2016   Procedure: LAPAROSCOPIC SIGMOID COLECTOMY;  Surgeon: Excell Seltzer, MD;  Location: [facility];  Service: General;  Laterality: N/A;   LASIK Bilateral    SHOULDER ARTHROSCOPY Left 04/24/2020   Procedure: LEFT SHOULDER MANIPULATION UNDER ANESTHESIA, ROTATOR INTERVAL RELEASE, DEBRIDEMENT, BICEPS TENODESIS;  Surgeon: Meredith Pel, MD;  Location: [place];  Service: Orthopedics;  Laterality: Left;   UMBILICAL HERNIA REPAIR N/A 02/01/2015   Procedure: LAPAROSCOPIC  UMBILICAL HERNIA REPAIR WITH MESH;  Surgeon: Rolm Bookbinder, MD;  Location: [place];  Service: General;  Laterality: N/A;    There were no vitals filed for this visit.    Subjective Assessment - 07/11/21 1006     Subjective Pt. presents with acute R focal shoulder pain. This pain has been present since June 21, 2021. He describes the pain as achey, sharp pain that travels from the shoulder, to the lateral side of the arm and causes numbness/ tingling in the ring and pinky finger at times. He describes the pain at the worst 5-6/10 and 2-3/10 at it's best. He also notes stiffness of the neck with ROM movements.    Pertinent History Prior sciatica.  Prior left shoulder rotator cuff problem.  Diabetes. ADHD (attention deficit hyperactivity disorder), Tachycardia, Obesity    Limitations Reading;House hold activities;Sitting    Diagnostic tests "No acute findings. Mild spondylosis of the cervical spine. Minimal left-sided neural  foraminal narrowing at the C2-3, C3-4 and C6-7 levels." DG cervical spine complete    Patient Stated Goals Decrease pain, improve ADL's    Currently in Pain? Yes    Pain Score 3     Pain Location Shoulder    Pain Orientation Right    Pain Descriptors / Indicators Aching;Numbness;Sharp;Shooting;Pins and needles;Tingling;Tightness    Pain Type Acute pain  Pain Radiating Towards Lateral R arm, and pinky/ ring finger of R hand    Pain Onset 1 to 4 weeks ago    Pain Frequency Constant    Aggravating Factors  Putting eye drops in, sitting at computer for long periods of time    Pain Relieving Factors meds                [facility] PT Assessment - 07/11/21 0001       Assessment   Medical Diagnosis Cervical radiculopathy    Referring Provider (PT) [name], MD    Onset Date/Surgical Date 06/21/21    Hand Dominance Right    Next MD Visit 08/23/21    Prior Therapy yes      Precautions   Precautions None      Restrictions   Weight Bearing Restrictions No       Balance Screen   Has the patient fallen in the past 6 months No    Has the patient had a decrease in activity level because of a fear of falling?  No    Is the patient reluctant to leave their home because of a fear of falling?  No      Home Ecologist residence      Prior Function   Level of Independence Independent    Vocation Other (comment)    Vocation Careers adviser online course    Leisure fish      Cognition   Overall Cognitive Status Within Functional Limits for tasks assessed      Observation/Other Assessments   Focus on Therapeutic Outcomes (FOTO)  48%      ROM / Strength   AROM / PROM / Strength AROM;Strength      AROM   Overall AROM Comments Cervical AROM WFL in all directions with reported stiffness      Strength   Overall Strength Comments R Shoulder Strength 5/5 grossly      Palpation   Palpation comment active trigger points noted in Rt infraspinatus, supraspinatus, upper trap      Special Tests    Special Tests Cervical    Cervical Tests Dictraction;other      Distraction Test   Findngs Negative    Comment does relay this feels good      other    Findings Negative    Comment ULTT- Ulnar Nerve   R/L                       Objective measurements completed on examination: See above findings.       [place] Adult PT Treatment/Exercise - 07/11/21 0001       Manual Therapy   Manual therapy comments central and Rt unilateral PA mobs to C7 grade 2-3, skilled palpation with active compression during DN.              Trigger Point Dry Needling - 07/11/21 0001     Consent Given? Yes    Education Handout Provided --   verbal education provided   Muscles Treated Head and Neck Upper trapezius    Muscles Treated Upper Quadrant Supraspinatus;Infraspinatus    Dry Needling Comments good overall tolerance, twitch response noted in supraspinatus                   PT Education -  07/11/21 1029     Education Details Pt. educated on HEP and referral pattern distribution that may be  contributing to the pt's pain    Person(s) Educated Patient    Methods Explanation;Demonstration;Verbal cues;Handout    Comprehension Verbalized understanding;Returned demonstration;Need further instruction;Verbal cues required              PT Short Term Goals - 07/11/21 1045       PT SHORT TERM GOAL #1   Title Decrease pain with activity 25%    Baseline (5-6/10 currently)    Time 4    Period Weeks    Status New    Target Date 08/08/21               PT Long Term Goals - 07/11/21 1053       PT LONG TERM GOAL #1   Title Patient will demonstrate independent use of home exercise program to facilitate ability to maintain/progress functional gains from skilled physical therapy services.    Time 6    Period Weeks    Status New    Target Date 08/22/21      PT LONG TERM GOAL #2   Title Pt. will increase FOTO score to 70%    Baseline 48% now    Time 6    Period Weeks    Status New    Target Date 08/22/21      PT LONG TERM GOAL #3   Title Patient will decrease pain with activity 50%    Baseline (5-6/10 currently)    Time 6    Period Weeks    Status New    Target Date 08/22/21                    Plan - 07/11/21 0940     Clinical Impression Statement Pt. presents with  pain in his Rt scapula that refers down the lateral arm and leads to numbness in the lateral two fingers at some points. He has trigger points noted in his Rt parascapular muscles and upper trap.  Cervical AROM is full with some stiffness of the neck. Cervical PAM's exhibit concordant sx at C7, which could lead to the radiating pain down the arm. ULTT for the ulnar nerve were negative on both sides. Triggerpoint dry needling was performed today to address focal triggerpoint of R shoulder. Pt. responded well to this treatment. Neck mobility home exercises to address stiffness, and mobilize the  C7 vertebrae were adminstered today, as well as exercises to facilitate cervico-thoracic motion.    Personal Factors and Comorbidities Comorbidity 3+    Comorbidities Diabetes. ADHD (attention deficit hyperactivity disorder), Tachycardia, Obesity    Examination-Activity Limitations Carry;Reach Overhead;Lift;Hygiene/Grooming    Examination-Participation Restrictions Cleaning;Occupation;School;Community Activity    Stability/Clinical Decision Making Stable/Uncomplicated    Clinical Decision Making Low    Rehab Potential Good    PT Frequency 1x / week   1-2x/ week   PT Duration 6 weeks    PT Treatment/Interventions ADLs/Self Care Home Management;Moist Heat;Iontophoresis 4mg /ml Dexamethasone;Electrical Stimulation;Cryotherapy;Traction;Ultrasound;Therapeutic activities;Therapeutic exercise;Patient/family education;Manual techniques;Energy conservation;Dry needling;Passive range of motion;Vasopneumatic Device;Joint Manipulations;Spinal Manipulations    PT Next Visit Plan Assess HEP, and assess results of dry needling done at today's visit    PT Home Exercise Plan Access Code: [facility]  URL: [email or web address]  Date: 07/11/2021  Prepared by: Elsie Ra    Exercises  Cervical Extension AROM with Strap - 2 x daily - 6 x weekly - 1-2 sets - 10 reps  Seated Passive Cervical Retraction - 2 x daily - 6 x weekly - 1-2 sets - 10 reps  Seated Upper  Trapezius Stretch - 2 x daily - 6 x weekly - 1 sets - 5 reps - 10 sec hold  Seated Levator Scapulae Stretch - 2 x daily - 6 x weekly - 1 sets - 5 reps - 10 hold  Seated Thoracic Extension with Hands Behind Neck - 2 x daily - 6 x weekly - 1-2 sets - 10 reps - 5 sec hold  Sidelying Thoracic and Shoulder Rotation - 2 x daily - 6 x weekly - 1 sets - 5 reps - 10 hold    Consulted and Agree with Plan of Care Patient             Patient will benefit from skilled therapeutic intervention in order to improve the following deficits and impairments:   Decreased mobility, Hypomobility, Increased muscle spasms, Decreased activity tolerance, Impaired UE functional use, Pain, Postural dysfunction  Visit Diagnosis: Acute pain of right shoulder  Radiculopathy, cervical region     Problem List Patient Active Problem List   Diagnosis Date Noted   Obstructive sleep apnea 10/05/2019   Dyslipidemia 07/09/2019   Annual physical exam 02/02/2019   Diverticulitis of sigmoid colon 05/24/2016   Morbid obesity ([place]) 04/25/2016   ADD (attention deficit disorder) 12/28/2015   Diverticulitis of large intestine with abscess without bleeding 09/13/2015   PCP NOTES >>>>>>>>>>>>>>>>>>>> 09/07/2015   Elevated WBCs 01/27/2014   Diabetes ([place]) 02/17/2013   Tachycardia 08/29/2011   TOBACCO USER 45/85/9292    Merdis Snodgrass Singer, Student-PT 07/11/2021, 11:35 AM  [facility] Physical Therapy [address] [place], [place], [address] Phone: [phone]   Fax:  [phone]  Name: Spencer Good MRN: [id number] Date of Birth: Feb 26, 1976

## 2021-07-19 ENCOUNTER — Ambulatory Visit: Payer: BC Managed Care – PPO | Admitting: Physical Therapy

## 2021-07-19 ENCOUNTER — Encounter: Payer: Self-pay | Admitting: Physical Therapy

## 2021-07-19 ENCOUNTER — Other Ambulatory Visit: Payer: Self-pay

## 2021-07-19 DIAGNOSIS — R600 Localized edema: Secondary | ICD-10-CM | POA: Diagnosis not present

## 2021-07-19 DIAGNOSIS — M6281 Muscle weakness (generalized): Secondary | ICD-10-CM

## 2021-07-19 DIAGNOSIS — M5412 Radiculopathy, cervical region: Secondary | ICD-10-CM

## 2021-07-19 DIAGNOSIS — M25511 Pain in right shoulder: Secondary | ICD-10-CM

## 2021-07-19 NOTE — Therapy (Signed)
[facility] Physical Therapy [address] [place], [place], [address] Phone: [phone]   Fax:  [phone]  Physical Therapy Treatment  Patient Details  Name: Spencer Good MRN: [id number] Date of Birth: 11-29-1975 Referring Provider (PT): [name], MD   Encounter Date: 07/19/2021   PT End of Session - 07/19/21 0943     Visit Number 2    Number of Visits 12    Date for PT Re-Evaluation 08/22/21    PT Start Time 0930    PT Stop Time 9528    PT Time Calculation (min) 45 min    Activity Tolerance Patient tolerated treatment well    Behavior During Therapy [facility] for tasks assessed/performed             Past Medical History:  Diagnosis Date   ADHD (attention deficit hyperactivity disorder)    Diabetes ([place])    Diverticulitis of colon with perforation    s/p sigmoid colectomy   Gout    Hernia, umbilical 4132   Laparoscopic umbilical hernia repair with mesh- s/p repair-resolved.   History of colon polyps    Hyperlipidemia    Hypertension    Insomnia    Obesity    OSA (obstructive sleep apnea) dx 05-2013   Rx a Cpap 3-15, can't use - no longer has machine.   Sleep apnea    doesn't use CPAP    Past Surgical History:  Procedure Laterality Date   COLONOSCOPY     EYE SURGERY     INSERTION OF MESH N/A 02/01/2015   Procedure: INSERTION OF MESH;  Surgeon: Rolm Bookbinder, MD;  Location: [place];  Service: General;  Laterality: N/A;   LAPAROSCOPIC SIGMOID COLECTOMY N/A 05/24/2016   Procedure: LAPAROSCOPIC SIGMOID COLECTOMY;  Surgeon: Excell Seltzer, MD;  Location: [facility];  Service: General;  Laterality: N/A;   LASIK Bilateral    SHOULDER ARTHROSCOPY Left 04/24/2020   Procedure: LEFT SHOULDER MANIPULATION UNDER ANESTHESIA, ROTATOR INTERVAL RELEASE, DEBRIDEMENT, BICEPS TENODESIS;  Surgeon: Meredith Pel, MD;  Location: [place];  Service: Orthopedics;  Laterality: Left;   UMBILICAL HERNIA REPAIR N/A 02/01/2015   Procedure: LAPAROSCOPIC UMBILICAL  HERNIA REPAIR WITH MESH;  Surgeon: Rolm Bookbinder, MD;  Location: [place];  Service: General;  Laterality: N/A;    There were no vitals filed for this visit.   Subjective Assessment - 07/19/21 0934     Subjective Pt. states that the pain he is feeling today has increased since the last session. He reports a pain level of 3-4/10 in R shoulder. He states that he has also been having pain down towards the elbow, and point tenderness along the medial middle forearm. He is not sure where this pain stemmed from.    Pertinent History Prior sciatica.  Prior left shoulder rotator cuff problem.  Diabetes. ADHD (attention deficit hyperactivity disorder), Tachycardia, Obesity    Limitations Reading;House hold activities;Sitting    Diagnostic tests "No acute findings. Mild spondylosis of the cervical spine. Minimal left-sided neural  foraminal narrowing at the C2-3, C3-4 and C6-7 levels." DG cervical spine complete    Patient Stated Goals Decrease pain, improve ADL's    Pain Onset 1 to 4 weeks ago                               [facility] Adult PT Treatment/Exercise - 07/19/21 0001       Neck Exercises: Machines for Strengthening   UBE (Upper Arm  Bike) Lvl 4.8, 3 min forward/ 3 min backward seat 13      Modalities   Modalities Traction      Traction   Type of Traction Cervical    Min (lbs) 15    Max (lbs) 20    Hold Time 60 sec. intermittent    Time 10 minutes      Manual Therapy   Manual therapy comments active compression and skilled palpation with DN              Trigger Point Dry Needling - 07/19/21 0001     Consent Given? Yes    Education Handout Provided Previously provided    Muscles Treated Upper Quadrant Supraspinatus;Infraspinatus    Dry Needling Comments also performed at Rt elbow extensor group and combined all DN with Estim mili current at L2 frequency at tolerated intensity level. Good overall tolerance with DN. DN performed by Elsie Ra PT,DPT                    PT Education - 07/19/21 1309     Education Details traction rationale and Estim rationale combined with DN    Person(s) Educated Patient    Methods Explanation    Comprehension Verbalized understanding              PT Short Term Goals - 07/11/21 1045       PT SHORT TERM GOAL #1   Title Decrease pain with activity 25%    Baseline (5-6/10 currently)    Time 4    Period Weeks    Status New    Target Date 08/08/21               PT Long Term Goals - 07/11/21 1053       PT LONG TERM GOAL #1   Title Patient will demonstrate independent use of home exercise program to facilitate ability to maintain/progress functional gains from skilled physical therapy services.    Time 6    Period Weeks    Status New    Target Date 08/22/21      PT LONG TERM GOAL #2   Title Pt. will increase FOTO score to 70%    Baseline 48% now    Time 6    Period Weeks    Status New    Target Date 08/22/21      PT LONG TERM GOAL #3   Title Patient will decrease pain with activity 50%    Baseline (5-6/10 currently)    Time 6    Period Weeks    Status New    Target Date 08/22/21                   Plan - 07/19/21 0952     Clinical Impression Statement Today's session focused on pain modulation of the Rt. shoulder and Rt. forearm pain. This was accomplished today via dry needling to the R shoulder supraspinatus and infraspinatus areas, and to the R forearm extensor and supinator muscles. Pt. responded well to this treatment. Cervical traction was also utilized, in hopes of decreasing pain that may be caused from radicular components in the neck. Pt. also responded well to this treatment, and we will reassess the results of these treatments.    Personal Factors and Comorbidities Comorbidity 3+    Comorbidities Diabetes. ADHD (attention deficit hyperactivity disorder), Tachycardia, Obesity    Examination-Activity Limitations Carry;Reach  Overhead;Lift;Hygiene/Grooming    Examination-Participation Restrictions Cleaning;Occupation;School;Community Activity  Stability/Clinical Decision Making Stable/Uncomplicated    Rehab Potential Good    PT Frequency 1x / week   1-2x/ week   PT Duration 6 weeks    PT Treatment/Interventions ADLs/Self Care Home Management;Moist Heat;Iontophoresis 4mg /ml Dexamethasone;Electrical Stimulation;Cryotherapy;Traction;Ultrasound;Therapeutic activities;Therapeutic exercise;Patient/family education;Manual techniques;Energy conservation;Dry needling;Passive range of motion;Vasopneumatic Device;Joint Manipulations;Spinal Manipulations    PT Next Visit Plan Assess results of dry needling and cervical traction to determine continued use of these interventions.    PT Home Exercise Plan Access Code: [facility]  URL: [email or web address]  Date: 07/11/2021  Prepared by: Elsie Ra    Exercises  Cervical Extension AROM with Strap - 2 x daily - 6 x weekly - 1-2 sets - 10 reps  Seated Passive Cervical Retraction - 2 x daily - 6 x weekly - 1-2 sets - 10 reps  Seated Upper Trapezius Stretch - 2 x daily - 6 x weekly - 1 sets - 5 reps - 10 sec hold  Seated Levator Scapulae Stretch - 2 x daily - 6 x weekly - 1 sets - 5 reps - 10 hold  Seated Thoracic Extension with Hands Behind Neck - 2 x daily - 6 x weekly - 1-2 sets - 10 reps - 5 sec hold  Sidelying Thoracic and Shoulder Rotation - 2 x daily - 6 x weekly - 1 sets - 5 reps - 10 hold    Consulted and Agree with Plan of Care Patient             Patient will benefit from skilled therapeutic intervention in order to improve the following deficits and impairments:  Decreased mobility, Hypomobility, Increased muscle spasms, Decreased activity tolerance, Impaired UE functional use, Pain, Postural dysfunction  Visit Diagnosis: Acute pain of right shoulder  Radiculopathy, cervical region  Muscle weakness (generalized)  Localized edema     Problem  List Patient Active Problem List   Diagnosis Date Noted   Obstructive sleep apnea 10/05/2019   Dyslipidemia 07/09/2019   Annual physical exam 02/02/2019   Diverticulitis of sigmoid colon 05/24/2016   Morbid obesity ([place]) 04/25/2016   ADD (attention deficit disorder) 12/28/2015   Diverticulitis of large intestine with abscess without bleeding 09/13/2015   PCP NOTES >>>>>>>>>>>>>>>>>>>> 09/07/2015   Elevated WBCs 01/27/2014   Diabetes ([place]) 02/17/2013   Tachycardia 08/29/2011   TOBACCO USER 49/82/6415    Bahja Bence Singer, Student-PT 07/19/2021, 1:11 PM  [facility] Physical Therapy [address] [place], [place], [address] Phone: [phone]   Fax:  [phone]  Name: CHASTEN BLAZE MRN: [id number] Date of Birth: 05-16-1976

## 2021-07-24 ENCOUNTER — Other Ambulatory Visit: Payer: Self-pay

## 2021-07-24 ENCOUNTER — Ambulatory Visit: Payer: BC Managed Care – PPO | Admitting: Physical Therapy

## 2021-07-24 DIAGNOSIS — M5412 Radiculopathy, cervical region: Secondary | ICD-10-CM

## 2021-07-24 DIAGNOSIS — M6281 Muscle weakness (generalized): Secondary | ICD-10-CM

## 2021-07-24 DIAGNOSIS — M25511 Pain in right shoulder: Secondary | ICD-10-CM | POA: Diagnosis not present

## 2021-07-24 NOTE — Therapy (Signed)
[facility] [address] [place], [place], [address] Phone: [phone]   Fax:  [phone]  Physical Therapy Treatment  Patient Details  Name: Spencer Good MRN: [id number] Date of Birth: Sep 08, 1975 Referring Provider (PT): [name], MD   Encounter Date: 07/24/2021   PT End of Session - 07/24/21 1639     Visit Number 3    Number of Visits 12    Date for PT Re-Evaluation 08/22/21    PT Start Time 1603    PT Stop Time 1648    PT Time Calculation (min) 45 min    Activity Tolerance Patient tolerated treatment well    Behavior During Therapy [facility] for tasks assessed/performed             Past Medical History:  Diagnosis Date   ADHD (attention deficit hyperactivity disorder)    Diabetes ([place])    Diverticulitis of colon with perforation    s/p sigmoid colectomy   Gout    Hernia, umbilical 3154   Laparoscopic umbilical hernia repair with mesh- s/p repair-resolved.   History of colon polyps    Hyperlipidemia    Hypertension    Insomnia    Obesity    OSA (obstructive sleep apnea) dx 05-2013   Rx a Cpap 3-15, can't use - no longer has machine.   Sleep apnea    doesn't use CPAP    Past Surgical History:  Procedure Laterality Date   COLONOSCOPY     EYE SURGERY     INSERTION OF MESH N/A 02/01/2015   Procedure: INSERTION OF MESH;  Surgeon: Rolm Bookbinder, MD;  Location: [place];  Service: General;  Laterality: N/A;   LAPAROSCOPIC SIGMOID COLECTOMY N/A 05/24/2016   Procedure: LAPAROSCOPIC SIGMOID COLECTOMY;  Surgeon: Excell Seltzer, MD;  Location: [facility];  Service: General;  Laterality: N/A;   LASIK Bilateral    SHOULDER ARTHROSCOPY Left 04/24/2020   Procedure: LEFT SHOULDER MANIPULATION UNDER ANESTHESIA, ROTATOR INTERVAL RELEASE, DEBRIDEMENT, BICEPS TENODESIS;  Surgeon: Meredith Pel, MD;  Location: [place];  Service: Orthopedics;  Laterality: Left;   UMBILICAL HERNIA REPAIR N/A 02/01/2015   Procedure: LAPAROSCOPIC UMBILICAL  HERNIA REPAIR WITH MESH;  Surgeon: Rolm Bookbinder, MD;  Location: [place];  Service: General;  Laterality: N/A;    There were no vitals filed for this visit.   Subjective Assessment - 07/24/21 1613     Subjective He is open to trying the traction machine, it was discontinued early last time due to him feelin claustrophobic, he will chew gum this time as he relays this helps. He feels the DN made him feel sore and tight after but then he felt much better. He states he is no longer having Rt elbow pain but does still relay pain at his mid wrist extensors. He would like to have DN again.    Pertinent History Prior sciatica.  Prior left shoulder rotator cuff problem.  Diabetes. ADHD (attention deficit hyperactivity disorder), Tachycardia, Obesity    Limitations Reading;House hold activities;Sitting    Diagnostic tests "No acute findings. Mild spondylosis of the cervical spine. Minimal left-sided neural  foraminal narrowing at the C2-3, C3-4 and C6-7 levels." DG cervical spine complete    Patient Stated Goals Decrease pain, improve ADL's    Pain Onset 1 to 4 weeks ago                               [facility] Adult PT Treatment/Exercise -  07/24/21 0001       Exercises   Exercises Other Exercises      Neck Exercises: Seated   Other Seated Exercise wrist extension stretch 30 sec X2    Other Seated Exercise wrist flexion and extension AROM with self compression to wrist extensors at his site of pain      Modalities   Modalities Traction      Traction   Type of Traction Cervical    Min (lbs) 17    Max (lbs) 25    Hold Time 60 sec. intermittent    Time 10 minutes      Manual Therapy   Manual therapy comments Grade 3 central PA mobs and unilat to each side at C7. active compression and skilled palpation with DN              Trigger Point Dry Needling - 07/24/21 0001     Consent Given? Yes    Education Handout Provided Previously provided    Muscles Treated Upper  Quadrant Subscapularis    Muscles Treated Wrist/Hand --   Rt wrist extensor group   Dry Needling Comments combined all DN with Estim mili current at L2 frequency at tolerated intensity level. Good overall tolerance with DN. DN performed by Elsie Ra PT,DPT                     PT Short Term Goals - 07/11/21 1045       PT SHORT TERM GOAL #1   Title Decrease pain with activity 25%    Baseline (5-6/10 currently)    Time 4    Period Weeks    Status New    Target Date 08/08/21               PT Long Term Goals - 07/11/21 1053       PT LONG TERM GOAL #1   Title Patient will demonstrate independent use of home exercise program to facilitate ability to maintain/progress functional gains from skilled physical therapy services.    Time 6    Period Weeks    Status New    Target Date 08/22/21      PT LONG TERM GOAL #2   Title Pt. will increase FOTO score to 70%    Baseline 48% now    Time 6    Period Weeks    Status New    Target Date 08/22/21      PT LONG TERM GOAL #3   Title Patient will decrease pain with activity 50%    Baseline (5-6/10 currently)    Time 6    Period Weeks    Status New    Target Date 08/22/21                   Plan - 07/24/21 1651     Clinical Impression Statement We trialed mechanical traction however no immediate relief from this. We added wrist extension stretch and self compression with wrist ROM into flexion and extension to see if this helps further improve his wrist pain. Overall pain is now not in elbow and only at wrist extensor group so we are treating this in both isolation at wrist but also at the neck/shoulder level if it is in fact radicular/referred pain in nature.    Personal Factors and Comorbidities Comorbidity 3+    Comorbidities Diabetes. ADHD (attention deficit hyperactivity disorder), Tachycardia, Obesity    Examination-Activity Limitations Carry;Reach Overhead;Lift;Hygiene/Grooming     Examination-Participation  Restrictions Cleaning;Occupation;School;Community Activity    Stability/Clinical Decision Making Stable/Uncomplicated    Rehab Potential Good    PT Frequency 1x / week   1-2x/ week   PT Duration 6 weeks    PT Treatment/Interventions ADLs/Self Care Home Management;Moist Heat;Iontophoresis 4mg /ml Dexamethasone;Electrical Stimulation;Cryotherapy;Traction;Ultrasound;Therapeutic activities;Therapeutic exercise;Patient/family education;Manual techniques;Energy conservation;Dry needling;Passive range of motion;Vasopneumatic Device;Joint Manipulations;Spinal Manipulations    PT Next Visit Plan Assess results of dry needling and how is pain/soreness.    PT Home Exercise Plan Access Code: [facility]  URL: [email or web address]  Date: 07/11/2021  Prepared by: Elsie Ra    Exercises  Cervical Extension AROM with Strap - 2 x daily - 6 x weekly - 1-2 sets - 10 reps  Seated Passive Cervical Retraction - 2 x daily - 6 x weekly - 1-2 sets - 10 reps  Seated Upper Trapezius Stretch - 2 x daily - 6 x weekly - 1 sets - 5 reps - 10 sec hold  Seated Levator Scapulae Stretch - 2 x daily - 6 x weekly - 1 sets - 5 reps - 10 hold  Seated Thoracic Extension with Hands Behind Neck - 2 x daily - 6 x weekly - 1-2 sets - 10 reps - 5 sec hold  Sidelying Thoracic and Shoulder Rotation - 2 x daily - 6 x weekly - 1 sets - 5 reps - 10 hold    Consulted and Agree with Plan of Care Patient             Patient will benefit from skilled therapeutic intervention in order to improve the following deficits and impairments:  Decreased mobility, Hypomobility, Increased muscle spasms, Decreased activity tolerance, Impaired UE functional use, Pain, Postural dysfunction  Visit Diagnosis: Acute pain of right shoulder  Radiculopathy, cervical region  Muscle weakness (generalized)     Problem List Patient Active Problem List   Diagnosis Date Noted   Obstructive sleep apnea 10/05/2019    Dyslipidemia 07/09/2019   Annual physical exam 02/02/2019   Diverticulitis of sigmoid colon 05/24/2016   Morbid obesity ([place]) 04/25/2016   ADD (attention deficit disorder) 12/28/2015   Diverticulitis of large intestine with abscess without bleeding 09/13/2015   PCP NOTES >>>>>>>>>>>>>>>>>>>> 09/07/2015   Elevated WBCs 01/27/2014   Diabetes ([place]) 02/17/2013   Tachycardia 08/29/2011   TOBACCO USER 06/29/2010    Debbe Odea, PT.DPT 07/24/2021, 4:54 PM  [facility] [address] [place], [place], [address] Phone: [phone]   Fax:  [phone]  Name: RAIJON LINDFORS MRN: [id number] Date of Birth: 21-May-1976

## 2021-07-30 ENCOUNTER — Encounter: Payer: Self-pay | Admitting: Physical Therapy

## 2021-07-30 ENCOUNTER — Ambulatory Visit: Payer: BC Managed Care – PPO | Admitting: Physical Therapy

## 2021-07-30 ENCOUNTER — Other Ambulatory Visit: Payer: Self-pay

## 2021-07-30 DIAGNOSIS — M6281 Muscle weakness (generalized): Secondary | ICD-10-CM

## 2021-07-30 DIAGNOSIS — M5412 Radiculopathy, cervical region: Secondary | ICD-10-CM | POA: Diagnosis not present

## 2021-07-30 DIAGNOSIS — M25511 Pain in right shoulder: Secondary | ICD-10-CM | POA: Diagnosis not present

## 2021-07-30 NOTE — Therapy (Signed)
[facility] [address] [place], [place], [address] Phone: [phone]   Fax:  [phone]  Physical Therapy Treatment  Patient Details  Name: Spencer Good MRN: [id number] Date of Birth: Apr 27, 1976 Referring Provider (PT): [name], MD   Encounter Date: 07/30/2021   PT End of Session - 07/30/21 0839     Visit Number 4    Number of Visits 12    Date for PT Re-Evaluation 08/22/21    PT Start Time 0805    PT Stop Time 9371    PT Time Calculation (min) 42 min    Activity Tolerance Patient tolerated treatment well    Behavior During Therapy [facility] for tasks assessed/performed             Past Medical History:  Diagnosis Date   ADHD (attention deficit hyperactivity disorder)    Diabetes ([place])    Diverticulitis of colon with perforation    s/p sigmoid colectomy   Gout    Hernia, umbilical 6967   Laparoscopic umbilical hernia repair with mesh- s/p repair-resolved.   History of colon polyps    Hyperlipidemia    Hypertension    Insomnia    Obesity    OSA (obstructive sleep apnea) dx 05-2013   Rx a Cpap 3-15, can't use - no longer has machine.   Sleep apnea    doesn't use CPAP    Past Surgical History:  Procedure Laterality Date   COLONOSCOPY     EYE SURGERY     INSERTION OF MESH N/A 02/01/2015   Procedure: INSERTION OF MESH;  Surgeon: Rolm Bookbinder, MD;  Location: [place];  Service: General;  Laterality: N/A;   LAPAROSCOPIC SIGMOID COLECTOMY N/A 05/24/2016   Procedure: LAPAROSCOPIC SIGMOID COLECTOMY;  Surgeon: Excell Seltzer, MD;  Location[facility];  Service: General;  Laterality: N/A;   LASIK Bilateral    SHOULDER ARTHROSCOPY Left 04/24/2020   Procedure: LEFT SHOULDER MANIPULATION UNDER ANESTHESIA, ROTATOR INTERVAL RELEASE, DEBRIDEMENT, BICEPS TENODESIS;  Surgeon: Meredith Pel, MD;  Location: [place];  Service: Orthopedics;  Laterality: Left;   UMBILICAL HERNIA REPAIR N/A 02/01/2015   Procedure: LAPAROSCOPIC  UMBILICAL HERNIA REPAIR WITH MESH;  Surgeon: Rolm Bookbinder, MD;  Location: [place];  Service: General;  Laterality: N/A;    There were no vitals filed for this visit.   Subjective Assessment - 07/30/21 0809     Subjective Pt. reports that the shoulder and neck were sore over the weekend. However the pt. slept well last night, as well as shoulder and neck pain have decreased this morning. He states the forearm pain has relocated more superiorly on the R arm.    Pertinent History Prior sciatica.  Prior left shoulder rotator cuff problem.  Diabetes. ADHD (attention deficit hyperactivity disorder), Tachycardia, Obesity    Limitations Reading;House hold activities;Sitting    Diagnostic tests "No acute findings. Mild spondylosis of the cervical spine. Minimal left-sided neural  foraminal narrowing at the C2-3, C3-4 and C6-7 levels." DG cervical spine complete    Patient Stated Goals Decrease pain, improve ADL's    Pain Onset 1 to 4 weeks ago                               [facility] Adult PT Treatment/Exercise - 07/30/21 0001       Neck Exercises: Machines for Strengthening   UBE (Upper Arm Bike) Lvl 4.0, 3 min forward/ 3 min backward seat 13  Neck Exercises: Standing   Other Standing Exercises ER doorway stretch bilat. 10 sec. hold  x 10      Neck Exercises: Seated   Other Seated Exercise bilat Upper Trapezius stretch bilat. 10 x 10sec.    Other Seated Exercise Stretching strap behind neck with 5 sec hold x 10 cervical ext SNAG.; Horizontal Abduction with green theraband 2 x 10; ER with green theraband 2 x 10.      Manual Therapy   Manual therapy comments Grade 3 central PA mobs and unilat to each side at C5- C7. active compression and skilled palpation with DN              Trigger Point Dry Needling - 07/30/21 0001     Consent Given? Yes    Education Handout Provided Previously provided    Muscles Treated Head and Neck Cervical multifidi;Splenius  capitus;Upper trapezius    Muscles Treated Upper Quadrant Subscapularis    Muscles Treated Wrist/Hand --   Rt wrist extensor group   Dry Needling Comments good overall tolerance to this, twitch response elicited and pistoning technique used today                     PT Short Term Goals - 07/11/21 1045       PT SHORT TERM GOAL #1   Title Decrease pain with activity 25%    Baseline (5-6/10 currently)    Time 4    Period Weeks    Status New    Target Date 08/08/21               PT Long Term Goals - 07/11/21 1053       PT LONG TERM GOAL #1   Title Patient will demonstrate independent use of home exercise program to facilitate ability to maintain/progress functional gains from skilled physical therapy services.    Time 6    Period Weeks    Status New    Target Date 08/22/21      PT LONG TERM GOAL #2   Title Pt. will increase FOTO score to 70%    Baseline 48% now    Time 6    Period Weeks    Status New    Target Date 08/22/21      PT LONG TERM GOAL #3   Title Patient will decrease pain with activity 50%    Baseline (5-6/10 currently)    Time 6    Period Weeks    Status New    Target Date 08/22/21                   Plan - 07/30/21 0840     Clinical Impression Statement Session focused on stretching of the neck and shoulders to relieve increased muscle tightness in these areas. There were no noted radicular sx reproduced this time with CPA's/ UPA's of the cervical spine. DN of the cervical, subscapular, and forearm extensor muscles, were facilitated noting multiple focal trigger points. Pt. responded well to all treatment today.    Personal Factors and Comorbidities Comorbidity 3+    Comorbidities Diabetes. ADHD (attention deficit hyperactivity disorder), Tachycardia, Obesity    Examination-Activity Limitations Carry;Reach Overhead;Lift;Hygiene/Grooming    Examination-Participation Restrictions Cleaning;Occupation;School;Community Activity     Stability/Clinical Decision Making Stable/Uncomplicated    Rehab Potential Good    PT Frequency 1x / week   1-2x/ week   PT Duration 6 weeks    PT Treatment/Interventions ADLs/Self Care Home Management;Moist Heat;Iontophoresis 4mg /ml Dexamethasone;Electrical  Stimulation;Cryotherapy;Traction;Ultrasound;Therapeutic activities;Therapeutic exercise;Patient/family education;Manual techniques;Energy conservation;Dry needling;Passive range of motion;Vasopneumatic Device;Joint Manipulations;Spinal Manipulations    PT Next Visit Plan Assess effects of DN and new cervical and shoulder exercises    PT Home Exercise Plan Access Code: [facility]  URL: [email or web address]  Date: 07/11/2021  Prepared by: Elsie Ra    Exercises  Cervical Extension AROM with Strap - 2 x daily - 6 x weekly - 1-2 sets - 10 reps  Seated Passive Cervical Retraction - 2 x daily - 6 x weekly - 1-2 sets - 10 reps  Seated Upper Trapezius Stretch - 2 x daily - 6 x weekly - 1 sets - 5 reps - 10 sec hold  Seated Levator Scapulae Stretch - 2 x daily - 6 x weekly - 1 sets - 5 reps - 10 hold  Seated Thoracic Extension with Hands Behind Neck - 2 x daily - 6 x weekly - 1-2 sets - 10 reps - 5 sec hold  Sidelying Thoracic and Shoulder Rotation - 2 x daily - 6 x weekly - 1 sets - 5 reps - 10 hold    Consulted and Agree with Plan of Care Patient             Patient will benefit from skilled therapeutic intervention in order to improve the following deficits and impairments:  Decreased mobility, Hypomobility, Increased muscle spasms, Decreased activity tolerance, Impaired UE functional use, Pain, Postural dysfunction  Visit Diagnosis: Acute pain of right shoulder  Radiculopathy, cervical region  Muscle weakness (generalized)     Problem List Patient Active Problem List   Diagnosis Date Noted   Obstructive sleep apnea 10/05/2019   Dyslipidemia 07/09/2019   Annual physical exam 02/02/2019   Diverticulitis of sigmoid colon  05/24/2016   Morbid obesity ([place]) 04/25/2016   ADD (attention deficit disorder) 12/28/2015   Diverticulitis of large intestine with abscess without bleeding 09/13/2015   PCP NOTES >>>>>>>>>>>>>>>>>>>> 09/07/2015   Elevated WBCs 01/27/2014   Diabetes ([place]) 02/17/2013   Tachycardia 08/29/2011   TOBACCO USER 16/03/9603    Garnell Begeman Singer, Student-PT 07/30/2021, [facility] [address] [place], [place], [address] Phone: [phone]   Fax:  [phone]  Name: Spencer Good MRN: [id number] Date of Birth: 1976/04/13

## 2021-08-07 ENCOUNTER — Encounter: Payer: Self-pay | Admitting: Physical Therapy

## 2021-08-07 ENCOUNTER — Other Ambulatory Visit: Payer: Self-pay

## 2021-08-07 ENCOUNTER — Ambulatory Visit: Payer: BC Managed Care – PPO | Admitting: Physical Therapy

## 2021-08-07 DIAGNOSIS — M25511 Pain in right shoulder: Secondary | ICD-10-CM

## 2021-08-07 DIAGNOSIS — M6281 Muscle weakness (generalized): Secondary | ICD-10-CM | POA: Diagnosis not present

## 2021-08-07 DIAGNOSIS — M5412 Radiculopathy, cervical region: Secondary | ICD-10-CM | POA: Diagnosis not present

## 2021-08-07 DIAGNOSIS — R600 Localized edema: Secondary | ICD-10-CM | POA: Diagnosis not present

## 2021-08-07 NOTE — Therapy (Signed)
[facility] Physical Therapy [address] [place], [place], [address] Phone: [phone]   Fax:  [phone]  Physical Therapy Treatment  Patient Details  Name: Spencer Good MRN: [id number] Date of Birth: [birth year] Referring Provider (PT): [name], MD   Encounter Date: 08/07/2021   PT End of Session - 08/07/21 0856     Visit Number 5    Number of Visits 12    Date for PT Re-Evaluation 08/22/21    PT Start Time 0800    PT Stop Time 0845    PT Time Calculation (min) 45 min    Activity Tolerance Patient tolerated treatment well    Behavior During Therapy [facility] for tasks assessed/performed             Past Medical History:  Diagnosis Date   ADHD (attention deficit hyperactivity disorder)    Diabetes ([place])    Diverticulitis of colon with perforation    s/p sigmoid colectomy   Gout    Hernia, umbilical 1937   Laparoscopic umbilical hernia repair with mesh- s/p repair-resolved.   History of colon polyps    Hyperlipidemia    Hypertension    Insomnia    Obesity    OSA (obstructive sleep apnea) dx 05-2013   Rx a Cpap 3-15, can't use - no longer has machine.   Sleep apnea    doesn't use CPAP    Past Surgical History:  Procedure Laterality Date   COLONOSCOPY     EYE SURGERY     INSERTION OF MESH N/A 02/01/2015   Procedure: INSERTION OF MESH;  Surgeon: Rolm Bookbinder, MD;  Location: [place];  Service: General;  Laterality: N/A;   LAPAROSCOPIC SIGMOID COLECTOMY N/A 05/24/2016   Procedure: LAPAROSCOPIC SIGMOID COLECTOMY;  Surgeon: Excell Seltzer, MD;  Location[facility];  Service: General;  Laterality: N/A;   LASIK Bilateral    SHOULDER ARTHROSCOPY Left 04/24/2020   Procedure: LEFT SHOULDER MANIPULATION UNDER ANESTHESIA, ROTATOR INTERVAL RELEASE, DEBRIDEMENT, BICEPS TENODESIS;  Surgeon: Meredith Pel, MD;  Location: [place];  Service: Orthopedics;  Laterality: Left;   UMBILICAL HERNIA REPAIR N/A 02/01/2015   Procedure: LAPAROSCOPIC  UMBILICAL HERNIA REPAIR WITH MESH;  Surgeon: Rolm Bookbinder, MD;  Location: [place];  Service: General;  Laterality: N/A;    There were no vitals filed for this visit.   Subjective Assessment - 08/07/21 0803     Subjective Pt. reports that his pain has been consistent since the last viist. He states that he received a massage on Friday because of the level of pain he was in. He does not feel like this massage helped reduce the pain. Pt states that he has not been without pain for a full day this entire year, and that the treatments we have been exploring have provided temporary relief to his pain but have not consistently helped reduce his forearm and shoulder pain.    Pertinent History Prior sciatica.  Prior left shoulder rotator cuff problem.  Diabetes. ADHD (attention deficit hyperactivity disorder), Tachycardia, Obesity    Limitations Reading;House hold activities;Sitting    Diagnostic tests "No acute findings. Mild spondylosis of the cervical spine. Minimal left-sided neural  foraminal narrowing at the C2-3, C3-4 and C6-7 levels." DG cervical spine complete    Patient Stated Goals Decrease pain, improve ADL's    Pain Onset 1 to 4 weeks ago  [place] Adult PT Treatment/Exercise - 08/07/21 0001       Neck Exercises: Standing   Other Standing Exercises Cervical SNAG with Extension 2 x 10 5 sec. hold; Cervical Retraction 2 x 10 5 sec hold, then combined cervical retraction with SNAG extension 2X10 holding 5 sec      Neck Exercises: Seated   Other Seated Exercise Thoracic Extensions in chair x 10 5 sec hold (self manipulation/ stretch)    Other Seated Exercise sitting on green pball walking into table top position for supine thoracic ext. 5 x 5 sec (self manipulation/ stretch)      Manual Therapy   Manual Therapy --    Manual therapy comments Spinal Manipulation: Prone thoracic manipulation and central PA mobilizations, seated C-T  Manipulation,                       PT Short Term Goals - 08/07/21 0919       PT SHORT TERM GOAL #1   Title Decrease pain with activity 25%    Baseline (5-6/10 currently)    Time 4    Period Weeks    Status On-going    Target Date 08/08/21               PT Long Term Goals - 08/07/21 0919       PT LONG TERM GOAL #1   Title Patient will demonstrate independent use of home exercise program to facilitate ability to maintain/progress functional gains from skilled physical therapy services.    Time 6    Period Weeks    Status On-going    Target Date 08/22/21      PT LONG TERM GOAL #2   Title Pt. will increase FOTO score to 70%    Baseline 48% now    Time 6    Period Weeks    Status On-going    Target Date 08/22/21      PT LONG TERM GOAL #3   Title Patient will decrease pain with activity 50%    Baseline (5-6/10 currently)    Time 6    Period Weeks    Status On-going    Target Date 08/22/21      PT LONG TERM GOAL #4   Status On-going      PT LONG TERM GOAL #5   Status On-going                   Plan - 08/07/21 0857     Clinical Impression Statement Session focused on exploring new methods to help decrease pt pain. This included spianl manipulations, self- manipulations techniques and cervical self- mobilizations. Pt's previous treatment methods have not helped long term and the pain returns fairly soon after treatment. Pt is unsure about immediate benefit of treatment, he will trial updated HEP focused on cervical- thoracic mobilizations.  We will reassess if these methods helped at next session    Personal Factors and Comorbidities Comorbidity 3+    Comorbidities Diabetes. ADHD (attention deficit hyperactivity disorder), Tachycardia, Obesity    Examination-Activity Limitations Carry;Reach Overhead;Lift;Hygiene/Grooming    Examination-Participation Restrictions Cleaning;Occupation;School;Community Activity    Stability/Clinical Decision  Making Stable/Uncomplicated    Rehab Potential Good    PT Frequency 1x / week   1-2x/ week   PT Duration 6 weeks    PT Treatment/Interventions ADLs/Self Care Home Management;Moist Heat;Iontophoresis 4mg /ml Dexamethasone;Electrical Stimulation;Cryotherapy;Traction;Ultrasound;Therapeutic activities;Therapeutic exercise;Patient/family education;Manual techniques;Energy conservation;Dry needling;Passive range of motion;Vasopneumatic Device;Joint Manipulations;Spinal Manipulations    PT Next  Visit Plan update measurments, goals, FOTO for MD note Reassess updated HEP & effects of last treatment session, discuss anatomical findings with pt of rotator interval release as he has had this performed on other shoulder    PT Home Exercise Plan Access Code: [facility]  URL: [email or web address]  Date: 07/11/2021  Prepared by: Elsie Ra    Exercises  Cervical Extension AROM with Strap - 2 x daily - 6 x weekly - 1-2 sets - 10 reps  Seated Passive Cervical Retraction - 2 x daily - 6 x weekly - 1-2 sets - 10 reps  Seated Upper Trapezius Stretch - 2 x daily - 6 x weekly - 1 sets - 5 reps - 10 sec hold  Seated Levator Scapulae Stretch - 2 x daily - 6 x weekly - 1 sets - 5 reps - 10 hold  Seated Thoracic Extension with Hands Behind Neck - 2 x daily - 6 x weekly - 1-2 sets - 10 reps - 5 sec hold  Sidelying Thoracic and Shoulder Rotation - 2 x daily - 6 x weekly - 1 sets - 5 reps - 10 hold    Consulted and Agree with Plan of Care Patient             Patient will benefit from skilled therapeutic intervention in order to improve the following deficits and impairments:  Decreased mobility, Hypomobility, Increased muscle spasms, Decreased activity tolerance, Impaired UE functional use, Pain, Postural dysfunction  Visit Diagnosis: Acute pain of right shoulder  Radiculopathy, cervical region  Muscle weakness (generalized)  Localized edema     Problem List Patient Active Problem List   Diagnosis  Date Noted   Obstructive sleep apnea 10/05/2019   Dyslipidemia 07/09/2019   Annual physical exam 02/02/2019   Diverticulitis of sigmoid colon 05/24/2016   Morbid obesity ([place]) 04/25/2016   ADD (attention deficit disorder) 12/28/2015   Diverticulitis of large intestine with abscess without bleeding 09/13/2015   PCP NOTES >>>>>>>>>>>>>>>>>>>> 09/07/2015   Elevated WBCs 01/27/2014   Diabetes ([place]) 02/17/2013   Tachycardia 08/29/2011   TOBACCO USER 25/00/3704    Jilliann Subramanian Singer, Student-PT 08/07/2021, 9:21 AM  [facility] Physical Therapy [address] [place], [place], [address] Phone: [phone]   Fax:  [phone]  Name: Spencer Good MRN: [id number] Date of Birth: 22-May-1976

## 2021-08-14 ENCOUNTER — Ambulatory Visit: Payer: BC Managed Care – PPO | Admitting: Physical Therapy

## 2021-08-14 ENCOUNTER — Encounter: Payer: Self-pay | Admitting: Physical Therapy

## 2021-08-14 ENCOUNTER — Other Ambulatory Visit: Payer: Self-pay

## 2021-08-14 DIAGNOSIS — R600 Localized edema: Secondary | ICD-10-CM | POA: Diagnosis not present

## 2021-08-14 DIAGNOSIS — M6281 Muscle weakness (generalized): Secondary | ICD-10-CM

## 2021-08-14 DIAGNOSIS — M25511 Pain in right shoulder: Secondary | ICD-10-CM | POA: Diagnosis not present

## 2021-08-14 DIAGNOSIS — M5412 Radiculopathy, cervical region: Secondary | ICD-10-CM

## 2021-08-14 NOTE — Therapy (Signed)
[facility] Physical Therapy [address] [place], [place], [address] Phone: [phone]   Fax:  [phone]  Physical Therapy Treatment  Patient Details  Name: Spencer Good MRN: [id number] Date of Birth: [birth year] Referring Provider (PT): [name], MD   Encounter Date: 08/14/2021   PT End of Session - 08/14/21 0902     Visit Number 6    Number of Visits 12    Date for PT Re-Evaluation 08/22/21    PT Start Time 0805    PT Stop Time 0850    PT Time Calculation (min) 45 min    Activity Tolerance Patient tolerated treatment well    Behavior During Therapy [facility] for tasks assessed/performed             Past Medical History:  Diagnosis Date   ADHD (attention deficit hyperactivity disorder)    Diabetes ([place])    Diverticulitis of colon with perforation    s/p sigmoid colectomy   Gout    Hernia, umbilical 7564   Laparoscopic umbilical hernia repair with mesh- s/p repair-resolved.   History of colon polyps    Hyperlipidemia    Hypertension    Insomnia    Obesity    OSA (obstructive sleep apnea) dx 05-2013   Rx a Cpap 3-15, can't use - no longer has machine.   Sleep apnea    doesn't use CPAP    Past Surgical History:  Procedure Laterality Date   COLONOSCOPY     EYE SURGERY     INSERTION OF MESH N/A 02/01/2015   Procedure: INSERTION OF MESH;  Surgeon: Rolm Bookbinder, MD;  Location: [place];  Service: General;  Laterality: N/A;   LAPAROSCOPIC SIGMOID COLECTOMY N/A 05/24/2016   Procedure: LAPAROSCOPIC SIGMOID COLECTOMY;  Surgeon: Excell Seltzer, MD;  [facility];  Service: General;  Laterality: N/A;   LASIK Bilateral    SHOULDER ARTHROSCOPY Left 04/24/2020   Procedure: LEFT SHOULDER MANIPULATION UNDER ANESTHESIA, ROTATOR INTERVAL RELEASE, DEBRIDEMENT, BICEPS TENODESIS;  Surgeon: Meredith Pel, MD;  Location: [place];  Service: Orthopedics;  Laterality: Left;   UMBILICAL HERNIA REPAIR N/A 02/01/2015   Procedure: LAPAROSCOPIC  UMBILICAL HERNIA REPAIR WITH MESH;  Surgeon: Rolm Bookbinder, MD;  Location: [place];  Service: General;  Laterality: N/A;    There were no vitals filed for this visit.   Subjective Assessment - 08/14/21 0821     Subjective Pt states that his pain has improved slightly since the last visit. He can not pin point a specific activity or event when the pain got better, but he states he woke up yesterday and the Rt shoulder just felt better. He relays that the pain in the Rt shoulder is not the same pain that he had previously in the Lt shoulder. He also bought a neck and shoulder traction pillow, that may have contributed to his decrease in pain.    Pertinent History Prior sciatica.  Prior left shoulder rotator cuff problem.  Diabetes. ADHD (attention deficit hyperactivity disorder), Tachycardia, Obesity    Limitations Reading;House hold activities;Sitting    Diagnostic tests "No acute findings. Mild spondylosis of the cervical spine. Minimal left-sided neural  foraminal narrowing at the C2-3, C3-4 and C6-7 levels." DG cervical spine complete    Patient Stated Goals Decrease pain, improve ADL's    Pain Onset 1 to 4 weeks ago               [facility] Adult PT Treatment/Exercise - 08/14/21 0001  Neck Exercises: Machines for Strengthening   UBE (Upper Arm Bike) Lvl 3.0, 3 min forward/ 3 min backward seat 13 (P)       Neck Exercises: Standing   Other Standing Exercises standing doorway stretch mid 10 sec X10, and low 10 sec X10 for chest stretch, ER stretching, and bicep stretching 10 sec X5 ea      Modalities   Modalities Iontophoresis      Iontophoresis   Type of Iontophoresis Dexamethasone    Location Rt shoulder    Dose 1.0CC    Time 4-6 hour wear home patch      Manual Therapy   Manual therapy comments active compression and skilled palpation with DN, grade 3-4 GH mobs Rt shoulder for inferior and posterior glides, distraction glides              Trigger Point Dry Needling  - 08/14/21 0001     Consent Given? Yes    Education Handout Provided Previously provided    Muscles Treated Upper Quadrant Subscapularis;Pectoralis major;Pectoralis minor;Latissimus dorsi;Biceps    Dry Needling Comments good overall tolerance to this                     PT Short Term Goals - 08/07/21 0919       PT SHORT TERM GOAL #1   Title Decrease pain with activity 25%    Baseline (5-6/10 currently)    Time 4    Period Weeks    Status On-going    Target Date 08/08/21               PT Long Term Goals - 08/07/21 0919       PT LONG TERM GOAL #1   Title Patient will demonstrate independent use of home exercise program to facilitate ability to maintain/progress functional gains from skilled physical therapy services.    Time 6    Period Weeks    Status On-going    Target Date 08/22/21      PT LONG TERM GOAL #2   Title Pt. will increase FOTO score to 70%    Baseline 48% now    Time 6    Period Weeks    Status On-going    Target Date 08/22/21      PT LONG TERM GOAL #3   Title Patient will decrease pain with activity 50%    Baseline (5-6/10 currently)    Time 6    Period Weeks    Status On-going    Target Date 08/22/21      PT LONG TERM GOAL #4   Status On-going      PT LONG TERM GOAL #5   Status On-going                   Plan - 08/14/21 0908     Clinical Impression Statement He did not appear to benefit from cervical-thoracic manipulations so instead of performing this today we tried different approach. His pain is more in his anterior shoulder and there is suspicion for nerve entrapment/compression around subscap/bicep/and pec insertions based on his referred pain patterns into his Rt forearm. We did try DN to these areas and trialed ionto patch to this area to see if this will help give any benefit to his continued Rt shoulder/arm pain. We did spend some time today reviewing anatomy, DN approach, ionto rationale and instructions, and  instructions for his home cervical traction pillow. If he does not improve with this  would recommend follow up with MD for possible imaging.    Personal Factors and Comorbidities Comorbidity 3+    Comorbidities Diabetes. ADHD (attention deficit hyperactivity disorder), Tachycardia, Obesity    Examination-Activity Limitations Carry;Reach Overhead;Lift;Hygiene/Grooming    Examination-Participation Restrictions Cleaning;Occupation;School;Community Activity    Stability/Clinical Decision Making Stable/Uncomplicated    Rehab Potential Good    PT Frequency 1x / week   1-2x/ week   PT Duration 6 weeks    PT Treatment/Interventions ADLs/Self Care Home Management;Moist Heat;Iontophoresis 4mg /ml Dexamethasone;Electrical Stimulation;Cryotherapy;Traction;Ultrasound;Therapeutic activities;Therapeutic exercise;Patient/family education;Manual techniques;Energy conservation;Dry needling;Passive range of motion;Vasopneumatic Device;Joint Manipulations;Spinal Manipulations    PT Next Visit Plan will need MD note so will update measurements and foto next time.    PT Home Exercise Plan Access Code: [facility]  URL: [email or web address]  Date: 07/11/2021  Prepared by: Elsie Ra    Exercises  Cervical Extension AROM with Strap - 2 x daily - 6 x weekly - 1-2 sets - 10 reps  Seated Passive Cervical Retraction - 2 x daily - 6 x weekly - 1-2 sets - 10 reps  Seated Upper Trapezius Stretch - 2 x daily - 6 x weekly - 1 sets - 5 reps - 10 sec hold  Seated Levator Scapulae Stretch - 2 x daily - 6 x weekly - 1 sets - 5 reps - 10 hold  Seated Thoracic Extension with Hands Behind Neck - 2 x daily - 6 x weekly - 1-2 sets - 10 reps - 5 sec hold  Sidelying Thoracic and Shoulder Rotation - 2 x daily - 6 x weekly - 1 sets - 5 reps - 10 hold    Consulted and Agree with Plan of Care Patient             Patient will benefit from skilled therapeutic intervention in order to improve the following deficits and impairments:   Decreased mobility, Hypomobility, Increased muscle spasms, Decreased activity tolerance, Impaired UE functional use, Pain, Postural dysfunction  Visit Diagnosis: Acute pain of right shoulder  Radiculopathy, cervical region  Muscle weakness (generalized)  Localized edema     Problem List Patient Active Problem List   Diagnosis Date Noted   Obstructive sleep apnea 10/05/2019   Dyslipidemia 07/09/2019   Annual physical exam 02/02/2019   Diverticulitis of sigmoid colon 05/24/2016   Morbid obesity ([place]) 04/25/2016   ADD (attention deficit disorder) 12/28/2015   Diverticulitis of large intestine with abscess without bleeding 09/13/2015   PCP NOTES >>>>>>>>>>>>>>>>>>>> 09/07/2015   Elevated WBCs 01/27/2014   Diabetes ([place]) 02/17/2013   Tachycardia 08/29/2011   TOBACCO USER 06/29/2010    Debbe Odea, PT,DPT 08/14/2021, 9:14 AM  [facility] Physical Therapy [address] [place], [place], [address] Phone: [phone]   Fax:  [phone]  Name: Spencer Good MRN: [id number] Date of Birth: Oct 19, 1975

## 2021-08-16 ENCOUNTER — Encounter: Payer: Self-pay | Admitting: Internal Medicine

## 2021-08-20 ENCOUNTER — Encounter: Payer: Self-pay | Admitting: Physical Therapy

## 2021-08-20 ENCOUNTER — Ambulatory Visit: Payer: BC Managed Care – PPO | Admitting: Physical Therapy

## 2021-08-20 ENCOUNTER — Other Ambulatory Visit: Payer: Self-pay

## 2021-08-20 DIAGNOSIS — R600 Localized edema: Secondary | ICD-10-CM | POA: Diagnosis not present

## 2021-08-20 DIAGNOSIS — M6281 Muscle weakness (generalized): Secondary | ICD-10-CM

## 2021-08-20 DIAGNOSIS — M5412 Radiculopathy, cervical region: Secondary | ICD-10-CM | POA: Diagnosis not present

## 2021-08-20 DIAGNOSIS — M25511 Pain in right shoulder: Secondary | ICD-10-CM | POA: Diagnosis not present

## 2021-08-20 NOTE — Therapy (Signed)
[facility] Physical Therapy [address] [place], [place], [address] Phone: [phone]   Fax:  [phone]  Physical Therapy Treatment  Patient Details  Name: Spencer Good MRN: [id number] Date of Birth: [birth year] Referring Provider (PT): [name], MD   Encounter Date: 08/20/2021   PT End of Session - 08/20/21 1030     Visit Number 7    Number of Visits 12    Date for PT Re-Evaluation 08/22/21    PT Start Time 0937    PT Stop Time 1020    PT Time Calculation (min) 43 min    Activity Tolerance Patient tolerated treatment well    Behavior During Therapy [facility] for tasks assessed/performed             Past Medical History:  Diagnosis Date   ADHD (attention deficit hyperactivity disorder)    Diabetes ([place])    Diverticulitis of colon with perforation    s/p sigmoid colectomy   Gout    Hernia, umbilical 9371   Laparoscopic umbilical hernia repair with mesh- s/p repair-resolved.   History of colon polyps    Hyperlipidemia    Hypertension    Insomnia    Obesity    OSA (obstructive sleep apnea) dx 05-2013   Rx a Cpap 3-15, can't use - no longer has machine.   Sleep apnea    doesn't use CPAP    Past Surgical History:  Procedure Laterality Date   COLONOSCOPY     EYE SURGERY     INSERTION OF MESH N/A 02/01/2015   Procedure: INSERTION OF MESH;  Surgeon: Rolm Bookbinder, MD;  Location: [place];  Service: General;  Laterality: N/A;   LAPAROSCOPIC SIGMOID COLECTOMY N/A 05/24/2016   Procedure: LAPAROSCOPIC SIGMOID COLECTOMY;  Surgeon: Excell Seltzer, MD;  Location: [facility];  Service: General;  Laterality: N/A;   LASIK Bilateral    SHOULDER ARTHROSCOPY Left 04/24/2020   Procedure: LEFT SHOULDER MANIPULATION UNDER ANESTHESIA, ROTATOR INTERVAL RELEASE, DEBRIDEMENT, BICEPS TENODESIS;  Surgeon: Meredith Pel, MD;  Location: [place];  Service: Orthopedics;  Laterality: Left;   UMBILICAL HERNIA REPAIR N/A 02/01/2015   Procedure: LAPAROSCOPIC UMBILICAL  HERNIA REPAIR WITH MESH;  Surgeon: Rolm Bookbinder, MD;  Location: [place];  Service: General;  Laterality: N/A;    There were no vitals filed for this visit.    [place] Adult PT Treatment/Exercise - 08/20/21 0001       Neck Exercises: Standing   Other Standing Exercises radial nerve flosses from HEP X15 ea see below in plan section for details. Doorway stretch low and mid 30 sec X 3 ea      Manual Therapy   Manual therapy comments active compression and skilled palpation with DN              Trigger Point Dry Needling - 08/20/21 0001     Consent Given? Yes    Education Handout Provided Previously provided    Dry Needling Comments cervical and thoracic multifidi and paraspinals, good overall tolerance to this                     PT Short Term Goals - 08/07/21 0919       PT SHORT TERM GOAL #1   Title Decrease pain with activity 25%    Baseline (5-6/10 currently)    Time 4    Period Weeks    Status On-going    Target Date 08/08/21  PT Long Term Goals - 08/20/21 1033       PT LONG TERM GOAL #1   Title Patient will demonstrate independent use of home exercise program to facilitate ability to maintain/progress functional gains from skilled physical therapy services.    Baseline updated today 3/6    Time 6    Period Weeks    Status On-going    Target Date 08/22/21      PT LONG TERM GOAL #2   Title Pt. will increase FOTO score to 70%    Baseline 48% now    Time 6    Period Weeks    Status On-going    Target Date 08/22/21      PT LONG TERM GOAL #3   Title Patient will decrease pain with activity 50%    Baseline still very up and down pain levels    Time 6    Period Weeks    Status On-going    Target Date 08/22/21                   Plan - 08/20/21 1012     Clinical Impression Statement He appears to have hit plateau in his progress. Although he is not having as much pain at its worse he is still having high pain levels  at time and PT has only provided temporary relief. He will hold PT for nor and follow up with MD on 08/23/21. He may benefit from further diagnostic testing as he still has signs of radial nerve entrapment and or cervical radiculopathy. I did modify his HEP for more radial nerve mobilizations to trial in the meantime.    Personal Factors and Comorbidities Comorbidity 3+    Comorbidities Diabetes. ADHD (attention deficit hyperactivity disorder), Tachycardia, Obesity    Examination-Activity Limitations Carry;Reach Overhead;Lift;Hygiene/Grooming    Examination-Participation Restrictions Cleaning;Occupation;School;Community Activity    Stability/Clinical Decision Making Stable/Uncomplicated    Rehab Potential Good    PT Frequency 1x / week   1-2x/ week   PT Duration 6 weeks    PT Treatment/Interventions ADLs/Self Care Home Management;Moist Heat;Iontophoresis '4mg'$ /ml Dexamethasone;Electrical Stimulation;Cryotherapy;Traction;Ultrasound;Therapeutic activities;Therapeutic exercise;Patient/family education;Manual techniques;Energy conservation;Dry needling;Passive range of motion;Vasopneumatic Device;Joint Manipulations;Spinal Manipulations    PT Next Visit Plan refer back to MD    PT Home Exercise Plan Access Code: [facility]  URL: [email or web address]  Date: 08/20/2021  Prepared by: Elsie Ra    Exercises  Radial Nerve Flossing - 2 x daily - 6 x weekly - 1-2 sets - 10 reps  Radial nerve ball passes - 2 x daily - 6 x weekly - 1-2 sets - 20 reps  Radial nerve sliders with towel behind back - 2 x daily - 6 x weekly - 1 sets - 20 reps  Radial Nerve Cross-Over - 2 x daily - 6 x weekly - 1-2 sets - 10 reps  Doorway Pec Stretch at 60 Degrees Abduction with Arm Straight - 2 x daily - 6 x weekly - 3 sets - 30 hold  Doorway Pec Stretch at 90 Degrees Abduction - 2 x daily - 3-4 x weekly - 3 reps - 1 sets - 30 hold  Cervical Extension AROM with Strap - 2 x daily - 6 x weekly - 1-2 sets - 10 reps  Seated Passive  Cervical Retraction - 2 x daily - 6 x weekly - 1-2 sets - 10 reps  Seated Upper Trapezius Stretch - 2 x daily - 6 x weekly - 1 sets - 5 reps - 10 sec hold  Shoulder External Rotation and Scapular Retraction with Resistance - 2 x daily - 6 x weekly - 2 sets - 15 reps  Standing Shoulder Horizontal Abduction with Resistance - 2 x daily - 6 x weekly - 2 sets - 15 reps    Consulted and Agree with Plan of Care Patient             Patient will benefit from skilled therapeutic intervention in order to improve the following deficits and impairments:  Decreased mobility, Hypomobility, Increased muscle spasms, Decreased activity tolerance, Impaired UE functional use, Pain, Postural dysfunction  Visit Diagnosis: Acute pain of right shoulder  Radiculopathy, cervical region  Muscle weakness (generalized)  Localized edema     Problem List Patient Active Problem List   Diagnosis Date Noted   Obstructive sleep apnea 10/05/2019   Dyslipidemia 07/09/2019   Annual physical exam 02/02/2019   Diverticulitis of sigmoid colon 05/24/2016   Morbid obesity ([place]) 04/25/2016   ADD (attention deficit disorder) 12/28/2015   Diverticulitis of large intestine with abscess without bleeding 09/13/2015   PCP NOTES >>>>>>>>>>>>>>>>>>>> 09/07/2015   Elevated WBCs 01/27/2014   Diabetes ([place]) 02/17/2013   Tachycardia 08/29/2011   TOBACCO USER 06/29/2010    Debbe Odea, PT,DPT 08/20/2021, 10:54 AM  [facility] Physical Therapy [address] [place], [place], [address] Phone: [phone]   Fax:  [phone]  Name: Spencer Good MRN: [id number] Date of Birth: [birth year]

## 2021-08-21 NOTE — Progress Notes (Signed)
I, Wendy Poet, LAT, ATC, am serving as scribe for Dr. Lynne Leader.  Spencer Good is a 46 y.o. male who presents to [place] [facility] today for f/u of R arm pain thought to be due to cervical radiculopathy and/or radial nerve entrapment.  He was last seen by Dr. Georgina Snell on 07/05/21 and was referred to PT of which he's completed 7 visits and was put on hold due to plateau of symptoms.  He was also prescribed gabapentin and a short course of prednisone.  Today, pt reports that he con't to have r arm  pain that will run down to his R forearm.  He notes that his pain is inconsistent in nature and location.  He does report some improvement in his symptoms.  He is intermittently taking his Gabapentin.    Diagnostic testing: C-spine XR- 07/05/21  Pertinent review of systems: No fevers or chills  Relevant historical information: Sleep apnea.  Not currently using his CPAP machine.. .  MRI related claustrophobia.   Exam:  BP 114/80 (BP Location: Right Arm, Patient Position: Sitting, Cuff Size: Large)    Pulse 93    Ht 6' (1.829 m)    Wt (!) 340 lb 9.6 oz (154.5 kg)    SpO2 94%    BMI 46.19 kg/m  General: Well Developed, well nourished, and in no acute distress.   MSK: C-spine: Decreased cervical motion.  Upper extremity strength is intact.  Positive right-sided Spurling's test.    Lab and Radiology Results EXAM: CERVICAL SPINE - COMPLETE 4+ VIEW   COMPARISON:  None.   FINDINGS: Straightening of the normal cervical lordosis. Vertebral body heights and disc space heights are normal. There is mild spondylosis of the cervical spine. Prevertebral soft tissues are normal. Atlantoaxial articulation is normal. Mild uncovertebral joint spurring. Very minimal left-sided neural foraminal narrowing at the C2-3, C3-4 and C6-7 levels.   IMPRESSION: 1. No acute findings. 2. Mild spondylosis of the cervical spine. Minimal left-sided neural foraminal narrowing at the C2-3,  C3-4 and C6-7 levels.     Electronically Signed   By: Marin Olp M.D.   On: 07/05/2021 15:12   I, Lynne Leader, personally (independently) visualized and performed the interpretation of the images attached in this note.      Assessment and Plan: 46 y.o. male with right cervical radiculopathy probably at C7 dermatomal pattern.  He this point has failed conservative management including trial of prednisone and gabapentin as well as a trial of physical therapy and physician directed home exercise program over the last 6 weeks.  Plan for MRI to further evaluate source of pain and to determine future treatment options including likely epidural steroid injection.  Very likely will proceed directly to epidural steroid injection after the MRI. MRI related anxiety history.  Will prescribe lorazepam.  Recommended to him that he give CPAP another try as he has uncontrolled sleep apnea.   PDMP not reviewed this encounter. Orders Placed This Encounter  Procedures   MR Cervical Spine Wo Contrast    Standing Status:   Future    Standing Expiration Date:   08/24/2022    Order Specific Question:   What is the patient's sedation requirement?    Answer:   No Sedation    Order Specific Question:   Does the patient have a pacemaker or implanted devices?    Answer:   No    Order Specific Question:   Preferred imaging location?    Answer:  [facility] (table limit-350lbs)   Meds ordered this encounter  Medications   LORazepam (ATIVAN) 0.5 MG tablet    Sig: 1-2 tabs 30 - 60 min prior to MRI. Do not drive with this medicine.    Dispense:  4 tablet    Refill:  0     Discussed warning signs or symptoms. Please see discharge instructions. Patient expresses understanding.   The above documentation has been reviewed and is accurate and complete Lynne Leader, M.D.

## 2021-08-21 NOTE — Progress Notes (Signed)
? ?I, Wendy Poet, LAT, ATC, am serving as scribe for Dr. Lynne Leader. ? ?Spencer Good is a 46 y.o. male who presents to [place] [facility] today for f/u of R arm pain thought to be due to cervical radiculopathy and/or radial nerve entrapment.  He was last seen by Dr. Georgina Snell on 07/05/21 and was referred to PT of which he's completed 7 visits and was put on hold due to plateau of symptoms.  He was also prescribed gabapentin and a short course of prednisone.  Today, pt reports that he con't to have r arm  pain that will run down to his R forearm.  He notes that his pain is inconsistent in nature and location.  He does report some improvement in his symptoms.  He is intermittently taking his Gabapentin.   ? ?Diagnostic testing: C-spine XR- 07/05/21 ? ?Pertinent review of systems: No fevers or chills ? ?Relevant historical information: Sleep apnea.  Not currently using his CPAP machine.. ?.  MRI related claustrophobia. ? ? ?Exam:  ?BP 114/80 (BP Location: Right Arm, Patient Position: Sitting, Cuff Size: Large)   Pulse 93   Ht 6' (1.829 m)   Wt (!) 340 lb 9.6 oz (154.5 kg)   SpO2 94%   BMI 46.19 kg/m?  ?General: Well Developed, well nourished, and in no acute distress.  ? ?MSK: C-spine: Decreased cervical motion.  Upper extremity strength is intact.  Positive right-sided Spurling's test. ? ? ? ?Lab and Radiology Results ?EXAM: ?CERVICAL SPINE - COMPLETE 4+ VIEW ?  ?COMPARISON:  None. ?  ?FINDINGS: ?Straightening of the normal cervical lordosis. Vertebral body ?heights and disc space heights are normal. There is mild spondylosis ?of the cervical spine. Prevertebral soft tissues are normal. ?Atlantoaxial articulation is normal. Mild uncovertebral joint ?spurring. Very minimal left-sided neural foraminal narrowing at the ?C2-3, C3-4 and C6-7 levels. ?  ?IMPRESSION: ?1. No acute findings. ?2. Mild spondylosis of the cervical spine. Minimal left-sided neural ?foraminal narrowing at the C2-3,  C3-4 and C6-7 levels. ?  ?  ?Electronically Signed ?  By: Marin Olp M.D. ?  On: 07/05/2021 15:12 ?  ?I, Lynne Leader, personally (independently) visualized and performed the interpretation of the images attached in this note. ? ? ? ? ? ?Assessment and Plan: ?46 y.o. male with right cervical radiculopathy probably at C7 dermatomal pattern.  He this point has failed conservative management including trial of prednisone and gabapentin as well as a trial of physical therapy and physician directed home exercise program over the last 6 weeks.  Plan for MRI to further evaluate source of pain and to determine future treatment options including likely epidural steroid injection.  Very likely will proceed directly to epidural steroid injection after the MRI. ?MRI related anxiety history.  Will prescribe lorazepam. ? ?Recommended to him that he give CPAP another try as he has uncontrolled sleep apnea. ? ? ?PDMP not reviewed this encounter. ?Orders Placed This Encounter  ?Procedures  ? MR Cervical Spine Wo Contrast  ?  Standing Status:   Future  ?  Standing Expiration Date:   08/24/2022  ?  Order Specific Question:   What is the patient's sedation requirement?  ?  Answer:   No Sedation  ?  Order Specific Question:   Does the patient have a pacemaker or implanted devices?  ?  Answer:   No  ?  Order Specific Question:   Preferred imaging location?  ?  Answer:   [facility] (table  limit-350lbs)  ? ?Meds ordered this encounter  ?Medications  ? LORazepam (ATIVAN) 0.5 MG tablet  ?  Sig: 1-2 tabs 30 - 60 min prior to MRI. Do not drive with this medicine.  ?  Dispense:  4 tablet  ?  Refill:  0  ? ? ? ?Discussed warning signs or symptoms. Please see discharge instructions. Patient expresses understanding. ? ? ?The above documentation has been reviewed and is accurate and complete Lynne Leader, M.D. ? ? ?

## 2021-08-23 ENCOUNTER — Encounter: Payer: Self-pay | Admitting: Family Medicine

## 2021-08-23 ENCOUNTER — Other Ambulatory Visit: Payer: Self-pay

## 2021-08-23 ENCOUNTER — Ambulatory Visit: Payer: BC Managed Care – PPO | Admitting: Family Medicine

## 2021-08-23 VITALS — BP 114/80 | HR 93 | Ht 72.0 in | Wt 340.6 lb

## 2021-08-23 DIAGNOSIS — M5412 Radiculopathy, cervical region: Secondary | ICD-10-CM

## 2021-08-23 DIAGNOSIS — G4733 Obstructive sleep apnea (adult) (pediatric): Secondary | ICD-10-CM | POA: Diagnosis not present

## 2021-08-23 MED ORDER — LORAZEPAM 0.5 MG PO TABS: ORAL_TABLET | ORAL | 0 refills | Status: DC

## 2021-08-23 NOTE — Patient Instructions (Addendum)
Good to see you today. ? ?I've ordered an MRI of your neck to [facility].  That facility will call you to schedule but please let us know if you haven't heard in one week regarding scheduling. ? ?After the MRI I will likely order an injection.  ? ? ?

## 2021-08-26 ENCOUNTER — Other Ambulatory Visit: Payer: Self-pay

## 2021-08-26 ENCOUNTER — Ambulatory Visit: Payer: BC Managed Care – PPO

## 2021-08-27 ENCOUNTER — Encounter: Payer: Self-pay | Admitting: Family Medicine

## 2021-08-27 DIAGNOSIS — G4733 Obstructive sleep apnea (adult) (pediatric): Secondary | ICD-10-CM

## 2021-08-27 DIAGNOSIS — M5412 Radiculopathy, cervical region: Secondary | ICD-10-CM

## 2021-09-10 ENCOUNTER — Other Ambulatory Visit: Payer: Self-pay | Admitting: Internal Medicine

## 2021-09-18 ENCOUNTER — Encounter (HOSPITAL_COMMUNITY): Payer: Self-pay

## 2021-09-18 NOTE — Pre-Procedure Instructions (Signed)
PCP - n/a ?Cardiologist - n/a ? ?EKG - DOS ?Chest x-ray - n/a ?ECHO - n/a ?Cardiac Cath - n/a ?CPAP - Yes - does not wear ? ? ?Fasting Blood Sugar:  unknown ?Checks Blood Sugar:  1/month ? ?Blood Thinner Instructions: n/a ?Aspirin Instructions: na/ ?ERAS Protcol - yes ?COVID TEST- no ? ?Anesthesia review: no ? ?------------- ? ?SDW INSTRUCTIONS: ? ?Your procedure is scheduled on 09/20/21. Please report to [facility] Main Entrance "A" at 0730 A.M., and check in at the Admitting office. Call this number if you have problems the morning of surgery: [phone] ? ? ?Remember: Do not eat after midnight the night before your surgery ? ?You may drink clear liquids until 0700 the morning of your surgery.   ?Clear liquids allowed are: Water, Non-Citrus Juices (without pulp), Carbonated Beverages, Clear Tea, Black Coffee Only, and Gatorade ?  ?Medications to take morning of surgery with a sip of water include: ?fenofibrate ? ?As of today, STOP taking any Aspirin (unless otherwise instructed by your surgeon), Aleve, Naproxen, Ibuprofen, Motrin, Advil, Goody's, BC's, all herbal medications, fish oil, and all vitamins. ? ?  ?The Morning of Surgery ?Do not wear jewelry, make-up or nail polish. ?Do not wear lotions, powders, or perfumes/colognes, or deodorant ?Do not bring valuables to the hospital. ?Spencer Good is not responsible for any belongings or valuables. ? ?If you are a smoker, DO NOT Smoke 24 hours prior to surgery ? ?If you wear a CPAP at night please bring your mask the morning of surgery  ? ?Remember that you must have someone to transport you home after your surgery, and remain with you for 24 hours if you are discharged the same day. ? ?Please bring cases for contacts, glasses, hearing aids, dentures or bridgework because it cannot be worn into surgery.  ? ?Patients discharged the day of surgery will not be allowed to drive home.  ? ?Please shower the NIGHT BEFORE/MORNING OF SURGERY (use antibacterial soap like DIAL  soap if possible). Wear comfortable clothes the morning of surgery. Oral Hygiene is also important to reduce your risk of infection.  Remember - BRUSH YOUR TEETH THE MORNING OF SURGERY WITH YOUR REGULAR TOOTHPASTE ? ?Patient denies shortness of breath, fever, cough and chest pain.  ? ? ?   ? ?

## 2021-09-20 ENCOUNTER — Ambulatory Visit (HOSPITAL_COMMUNITY): Payer: BC Managed Care – PPO | Admitting: Certified Registered"

## 2021-09-20 ENCOUNTER — Encounter (HOSPITAL_COMMUNITY): Payer: Self-pay | Admitting: Family Medicine

## 2021-09-20 ENCOUNTER — Other Ambulatory Visit: Payer: Self-pay

## 2021-09-20 ENCOUNTER — Ambulatory Visit (HOSPITAL_COMMUNITY)
Admission: RE | Admit: 2021-09-20 | Discharge: 2021-09-20 | Disposition: A | Discharge status: Home / Self Care | Payer: BC Managed Care – PPO | Attending: Family Medicine | Admitting: Family Medicine

## 2021-09-20 ENCOUNTER — Telehealth: Payer: Self-pay | Admitting: Family Medicine

## 2021-09-20 ENCOUNTER — Encounter (HOSPITAL_COMMUNITY)
Admission: RE | Disposition: A | Discharge status: Home / Self Care | Payer: Self-pay | Source: Home / Self Care | Attending: Family Medicine

## 2021-09-20 ENCOUNTER — Ambulatory Visit (HOSPITAL_COMMUNITY)
Admission: RE | Admit: 2021-09-20 | Discharge: 2021-09-20 | Disposition: A | Discharge status: Home / Self Care | Payer: BC Managed Care – PPO | Source: Ambulatory Visit | Attending: Family Medicine | Admitting: Family Medicine

## 2021-09-20 DIAGNOSIS — E119 Type 2 diabetes mellitus without complications: Secondary | ICD-10-CM | POA: Diagnosis not present

## 2021-09-20 DIAGNOSIS — M5412 Radiculopathy, cervical region: Secondary | ICD-10-CM

## 2021-09-20 DIAGNOSIS — Z87891 Personal history of nicotine dependence: Secondary | ICD-10-CM | POA: Diagnosis not present

## 2021-09-20 DIAGNOSIS — Z6841 Body Mass Index (BMI) 40.0 and over, adult: Secondary | ICD-10-CM | POA: Diagnosis not present

## 2021-09-20 DIAGNOSIS — G4733 Obstructive sleep apnea (adult) (pediatric): Secondary | ICD-10-CM | POA: Diagnosis not present

## 2021-09-20 DIAGNOSIS — M50122 Cervical disc disorder at C5-C6 level with radiculopathy: Secondary | ICD-10-CM | POA: Diagnosis not present

## 2021-09-20 DIAGNOSIS — E785 Hyperlipidemia, unspecified: Secondary | ICD-10-CM | POA: Diagnosis not present

## 2021-09-20 DIAGNOSIS — M4802 Spinal stenosis, cervical region: Secondary | ICD-10-CM | POA: Insufficient documentation

## 2021-09-20 DIAGNOSIS — I10 Essential (primary) hypertension: Secondary | ICD-10-CM | POA: Diagnosis not present

## 2021-09-20 DIAGNOSIS — Z7984 Long term (current) use of oral hypoglycemic drugs: Secondary | ICD-10-CM | POA: Insufficient documentation

## 2021-09-20 DIAGNOSIS — I4510 Unspecified right bundle-branch block: Secondary | ICD-10-CM | POA: Insufficient documentation

## 2021-09-20 DIAGNOSIS — M2578 Osteophyte, vertebrae: Secondary | ICD-10-CM | POA: Diagnosis not present

## 2021-09-20 HISTORY — PX: RADIOLOGY WITH ANESTHESIA: SHX6223

## 2021-09-20 LAB — GLUCOSE, CAPILLARY
Glucose-Capillary: 158 mg/dL — ABNORMAL HIGH (ref 70–99)
Glucose-Capillary: 126 mg/dL — ABNORMAL HIGH (ref 70–99)

## 2021-09-20 LAB — BASIC METABOLIC PANEL
Anion gap: 10 (ref 5–15)
BUN: 18 mg/dL (ref 6–20)
CO2: 17 mmol/L — ABNORMAL LOW (ref 22–32)
Calcium: 8.5 mg/dL — ABNORMAL LOW (ref 8.9–10.3)
Chloride: 108 mmol/L (ref 98–111)
Creatinine, Ser: 0.87 mg/dL (ref 0.61–1.24)
GFR, Estimated: >60 mL/min (ref >60)
Glucose, Bld: 154 mg/dL — ABNORMAL HIGH (ref 70–99)
Potassium: 4.2 mmol/L (ref 3.5–5.1)
Sodium: 135 mmol/L (ref 135–145)

## 2021-09-20 SURGERY — MRI WITH ANESTHESIA
Anesthesia: Monitor Anesthesia Care

## 2021-09-20 MED ORDER — LACTATED RINGERS IV SOLN: INTRAVENOUS | Status: DC

## 2021-09-20 MED ORDER — CHLORHEXIDINE GLUCONATE 0.12 % MT SOLN
OROMUCOSAL | Status: AC
  Administered 2021-09-20: 15 mL via OROMUCOSAL
  Filled 2021-09-20: qty 15

## 2021-09-20 MED ORDER — PROPOFOL 10 MG/ML IV BOLUS
INTRAVENOUS | Status: DC | PRN
  Administered 2021-09-20 (×2): 30 mg via INTRAVENOUS
  Administered 2021-09-20: 20 mg via INTRAVENOUS

## 2021-09-20 MED ORDER — PROPOFOL 500 MG/50ML IV EMUL
INTRAVENOUS | Status: DC | PRN
  Administered 2021-09-20: 20 ug/kg/min via INTRAVENOUS

## 2021-09-20 MED ORDER — CHLORHEXIDINE GLUCONATE 0.12 % MT SOLN: 15.0000 mL | Freq: Once | OROMUCOSAL | Status: AC

## 2021-09-20 MED ORDER — INSULIN ASPART 100 UNIT/ML IJ SOLN: 0.0000 [IU] | INTRAMUSCULAR | Status: DC | PRN

## 2021-09-20 MED ORDER — INSULIN ASPART 100 UNIT/ML IJ SOLN
INTRAMUSCULAR | Status: AC
  Administered 2021-09-20: 2 [IU] via SUBCUTANEOUS
  Filled 2021-09-20: qty 1

## 2021-09-20 MED ORDER — ORAL CARE MOUTH RINSE: 15.0000 mL | Freq: Once | OROMUCOSAL | Status: AC

## 2021-09-20 NOTE — Anesthesia Procedure Notes (Signed)
Procedure Name: [place] ?Date/Time: 09/20/2021 10:40 AM ?Performed by: Amadeo Garnet, CRNA ?Pre-anesthesia Checklist: Patient identified, Emergency Drugs available, Suction available and Patient being monitored ?Patient Re-evaluated:Patient Re-evaluated prior to induction ?Oxygen Delivery Method: Simple face mask ?Preoxygenation: Pre-oxygenation with 100% oxygen ?Induction Type: IV induction ?Placement Confirmation: positive ETCO2 ?Dental Injury: Teeth and Oropharynx as per pre-operative assessment  ? ? ? ? ?

## 2021-09-20 NOTE — Telephone Encounter (Signed)
Epidural steroid injection ordered 

## 2021-09-20 NOTE — Anesthesia Postprocedure Evaluation (Signed)
Anesthesia Post Note ? ?Patient: [name] ? ?Procedure(s) Performed: MRI WITH CERVICAL SPINE WITHOUT CONTRAST ? ?  ? ?Patient location during evaluation: Phase II ?Anesthesia Type: MAC ?Level of consciousness: awake ?Pain management: pain level controlled ?Vital Signs Assessment: post-procedure vital signs reviewed and stable ?Respiratory status: spontaneous breathing ?Cardiovascular status: stable ?Postop Assessment: no apparent nausea or vomiting ?Anesthetic complications: no ? ? ?No notable events documented. ? ?Last Vitals:  ?Vitals:  ? 09/20/21 1125 09/20/21 1140  ?BP: (!) 145/90 (!) 143/95  ?Pulse: 84 77  ?Resp: 16 19  ?Temp: 36.8 ?C 36.8 ?C  ?SpO2: 97% 96%  ?  ?Last Pain:  ?Vitals:  ? 09/20/21 1125  ?TempSrc:   ?PainSc: 0-No pain  ? ? ?  ?  ?  ?  ?  ?  ? ?Spencer Good ? ? ? ? ?

## 2021-09-20 NOTE — Transfer of Care (Signed)
Immediate Anesthesia Transfer of Care Note ? ?Patient: Spencer Good ? ?Procedure(s) Performed: MRI WITH CERVICAL SPINE WITHOUT CONTRAST ? ?Patient Location: PACU ? ?Anesthesia Type:General ? ?Level of Consciousness: awake, alert  and oriented ? ?Airway & Oxygen Therapy: Patient Spontanous Breathing ? ?Post-op Assessment: Report given to RN and Post -op Vital signs reviewed and stable ? ?Post vital signs: Reviewed and stable ? ?Last Vitals:  ?Vitals Value Taken Time  ?BP 143/95 09/20/21 1140  ?Temp 36.8 ?C 09/20/21 1140  ?Pulse 77 09/20/21 1140  ?Resp 19 09/20/21 1140  ?SpO2 96 % 09/20/21 1140  ?Vitals shown include unvalidated device data. ? ?Last Pain:  ?Vitals:  ? 09/20/21 1125  ?TempSrc:   ?PainSc: 0-No pain  ?   ? ?  ? ?Complications: No notable events documented. ?

## 2021-09-20 NOTE — Progress Notes (Signed)
MRI shows that the C7 nerve is being pinched on the right significantly and mildly on the left at C6. ?This fits with your symptoms and I have already ordered an epidural steroid injection.  Please contact [facility] at [phone] or [phone] to get this scheduled.

## 2021-09-20 NOTE — Anesthesia Preprocedure Evaluation (Addendum)
Anesthesia Evaluation  ?Patient identified by MRN, date of birth, ID band ?Patient awake ? ? ? ?Reviewed: ?Allergy & Precautions, NPO status , Patient's Chart, lab work & pertinent test results ? ?Airway ?Mallampati: IV ? ?TM Distance: >3 FB ?Neck ROM: Full ? ? ? Dental ?no notable dental hx. ? ?  ?Pulmonary ?sleep apnea , former smoker,  ?  ?Pulmonary exam normal ? ? ? ? ? ? ? Cardiovascular ?hypertension, Pt. on medications ?Normal cardiovascular exam ? ?ECG: rate 102. Sinus tachycardia ?Incomplete right bundle branch block ?  ?Neuro/Psych ?PSYCHIATRIC DISORDERS negative neurological ROS ?   ? GI/Hepatic ?negative GI ROS, Neg liver ROS,   ?Endo/Other  ?diabetes, Type 2, Oral Hypoglycemic AgentsMorbid obesity ? Renal/GU ?negative Renal ROS  ?negative genitourinary ?  ?Musculoskeletal ?Gout  ? Abdominal ?(+) + obese,   ?Peds ? ?(+) ADHD Hematology ?HLD   ?Anesthesia Other Findings ? ? Reproductive/Obstetrics ? ?  ? ? ? ? ? ? ? ? ? ? ? ? ? ?  ?  ? ? ? ? ? ? ? ? ?Anesthesia Physical ? ?Anesthesia Plan ? ?ASA: III ? ?Anesthesia Plan: MAC  ? ?Post-op Pain Management:   ? ?Induction: Intravenous ? ?PONV Risk Score and Plan: 1 and Midazolam, Treatment may vary due to age or medical condition, Propofol infusion and TIVA ? ?Airway Management Planned: Natural Airway, Nasal Cannula and Simple Face Mask ? ?Additional Equipment: None ? ?Intra-op Plan:  ? ?Post-operative Plan: Extubation in OR ? ?Informed Consent: I have reviewed the patients History and Physical, chart, labs and discussed the procedure including the risks, benefits and alternatives for the proposed anesthesia with the patient or authorized representative who has indicated his/her understanding and acceptance.  ? ? ? ? ? ?Plan Discussed with: CRNA ? ?Anesthesia Plan Comments:   ? ? ? ? ? ? ?Anesthesia Quick Evaluation ? ?

## 2021-09-21 ENCOUNTER — Encounter (HOSPITAL_COMMUNITY): Payer: Self-pay | Admitting: Radiology

## 2021-09-24 ENCOUNTER — Other Ambulatory Visit: Payer: Self-pay | Admitting: Internal Medicine

## 2021-09-25 ENCOUNTER — Ambulatory Visit
Admit: 2021-09-25 | Discharge: 2021-09-25 | Disposition: A | Discharge status: Home / Self Care | Payer: BC Managed Care – PPO | Attending: Family Medicine | Admitting: Family Medicine

## 2021-09-25 DIAGNOSIS — M5412 Radiculopathy, cervical region: Secondary | ICD-10-CM

## 2021-09-25 DIAGNOSIS — M50122 Cervical disc disorder at C5-C6 level with radiculopathy: Secondary | ICD-10-CM | POA: Diagnosis not present

## 2021-09-25 DIAGNOSIS — M50123 Cervical disc disorder at C6-C7 level with radiculopathy: Secondary | ICD-10-CM | POA: Diagnosis not present

## 2021-09-25 MED ORDER — IOPAMIDOL (ISOVUE-M 300) INJECTION 61%
1.0000 mL | Freq: Once | INTRAMUSCULAR | Status: AC | PRN
  Administered 2021-09-25: 1 mL via EPIDURAL

## 2021-09-25 MED ORDER — TRIAMCINOLONE ACETONIDE 40 MG/ML IJ SUSP (RADIOLOGY)
60.0000 mg | Freq: Once | INTRAMUSCULAR | Status: AC
Start: 2021-09-25 — End: 2021-09-25
  Administered 2021-09-25: 60 mg via EPIDURAL

## 2021-09-25 NOTE — Discharge Instructions (Signed)

## 2021-10-01 ENCOUNTER — Encounter: Payer: Self-pay | Admitting: Internal Medicine

## 2021-10-01 MED ORDER — ACCU-CHEK GUIDE VI STRP: ORAL_STRIP | 12 refills | Status: AC

## 2021-10-12 DIAGNOSIS — Z79899 Other long term (current) drug therapy: Secondary | ICD-10-CM | POA: Diagnosis not present

## 2021-10-12 DIAGNOSIS — F902 Attention-deficit hyperactivity disorder, combined type: Secondary | ICD-10-CM | POA: Diagnosis not present

## 2021-10-15 DIAGNOSIS — F902 Attention-deficit hyperactivity disorder, combined type: Secondary | ICD-10-CM | POA: Diagnosis not present

## 2021-10-29 ENCOUNTER — Ambulatory Visit (INDEPENDENT_AMBULATORY_CARE_PROVIDER_SITE_OTHER): Payer: BC Managed Care – PPO | Admitting: Internal Medicine

## 2021-10-29 ENCOUNTER — Encounter: Payer: Self-pay | Admitting: Internal Medicine

## 2021-10-29 VITALS — BP 134/87 | HR 87 | Temp 98.3°F | Resp 18 | Ht 72.0 in | Wt 336.2 lb

## 2021-10-29 DIAGNOSIS — E1165 Type 2 diabetes mellitus with hyperglycemia: Secondary | ICD-10-CM | POA: Diagnosis not present

## 2021-10-29 DIAGNOSIS — E785 Hyperlipidemia, unspecified: Secondary | ICD-10-CM

## 2021-10-29 LAB — HEMOGLOBIN A1C: Hgb A1c MFr Bld: 7.4 % — ABNORMAL HIGH (ref 4.6–6.5)

## 2021-10-29 NOTE — Patient Instructions (Signed)
Check the  blood pressure regularly ?BP GOAL is between 110/65 and  135/85. ?If it is consistently higher or lower, let me know ? ?Check your blood sugar at different times  ?- early in AM fasting  ( goal 70-130) ?- 2 hours after a meal (goal less than 180) ?- bedtime (goal 90-150) ?  ? ?GO TO THE LAB : Get the blood work   ? ? ?[place], [place] ?Come back for   a checkup in 4 months ?

## 2021-10-29 NOTE — Progress Notes (Signed)
? ?Subjective:  ? ? Patient ID: Spencer Good, male    DOB: 11-Feb-1976, 46 y.o.   MRN: [id number] ? ?DOS:  10/29/2021 ?Type of visit - description: f/u ? ?Today we took about hypertension, DM and high cholesterol. ?Also, had neck pain, chart reviewed, doing better ? ?Wt Readings from Last 3 Encounters:  ?10/29/21 (!) 336 lb 4 oz (152.5 kg)  ?09/20/21 (!) 335 lb (152 kg)  ?08/23/21 (!) 340 lb 9.6 oz (154.5 kg)  ? ? ? ?Review of Systems ?States he is eating a little better.  Ambulatory CBGs typically in the 140s.  Ambulatory BPs in the 130/85 ? ?Past Medical History:  ?Diagnosis Date  ? ADHD (attention deficit hyperactivity disorder)   ? Diabetes ([place])   ? Diverticulitis of colon with perforation   ? s/p sigmoid colectomy  ? Gout   ? Hernia, umbilical 6378  ? Laparoscopic umbilical hernia repair with mesh- s/p repair-resolved.  ? History of colon polyps   ? Hyperlipidemia   ? Hypertension   ? Insomnia   ? Obesity   ? OSA (obstructive sleep apnea) dx 05-2013  ? Rx a Cpap 3-15, can't use - no longer has machine.  ? Sleep apnea   ? doesn't use CPAP  ? ? ?Past Surgical History:  ?Procedure Laterality Date  ? COLONOSCOPY    ? EYE SURGERY    ? INSERTION OF MESH N/A 02/01/2015  ? Procedure: INSERTION OF MESH;  Surgeon: Rolm Bookbinder, MD;  Location: [place];  Service: General;  Laterality: N/A;  ? LAPAROSCOPIC SIGMOID COLECTOMY N/A 05/24/2016  ? Procedure: LAPAROSCOPIC SIGMOID COLECTOMY;  Surgeon: Excell Seltzer, MD;  [facility];  Service: General;  Laterality: N/A;  ? LASIK Bilateral   ? RADIOLOGY WITH ANESTHESIA N/A 09/20/2021  ? Procedure: MRI WITH CERVICAL SPINE WITHOUT CONTRAST;  Surgeon: Radiologist, Medication, MD;  Location: [place];  Service: Radiology;  Laterality: N/A;  ? SHOULDER ARTHROSCOPY Left 04/24/2020  ? Procedure: LEFT SHOULDER MANIPULATION UNDER ANESTHESIA, ROTATOR INTERVAL RELEASE, DEBRIDEMENT, BICEPS TENODESIS;  Surgeon: Meredith Pel, MD;  Location: [place];  Service: Orthopedics;  Laterality:  Left;  ? UMBILICAL HERNIA REPAIR N/A 02/01/2015  ? Procedure: LAPAROSCOPIC UMBILICAL HERNIA REPAIR WITH MESH;  Surgeon: Rolm Bookbinder, MD;  Location: [place];  Service: General;  Laterality: N/A;  ? ? ?Current Outpatient Medications  ?Medication Instructions  ? atorvastatin (LIPITOR) 20 MG tablet TAKE 1 TABLET(20 MG) BY MOUTH AT BEDTIME  ? fenofibrate 160 MG tablet TAKE 1 TABLET(160 MG) BY MOUTH DAILY  ? glucose blood (ACCU-CHEK GUIDE) test strip Check blood sugars once daily  ? JARDIANCE 25 MG TABS tablet TAKE 1 TABLET(25 MG) BY MOUTH DAILY  ? losartan (COZAAR) 100 MG tablet TAKE 1 TABLET(100 MG) BY MOUTH DAILY  ? metFORMIN (GLUCOPHAGE) 850 MG tablet TAKE 1 TABLET BY MOUTH WITH BREAKFAST, 1 TABLET WITH LUNCH, AND 1 TABLET WITH EVENING MEAL  ? Multiple Vitamin (MULTIVITAMIN) tablet 1 tablet, Oral, Daily  ? Probiotic Product (PROBIOTIC PO) 1 tablet, Oral, Daily  ? psyllium (METAMUCIL) 58.6 % powder 1 packet, Oral, Daily PRN  ? Rybelsus 14 mg, Oral, Daily  ? Vyvanse 50 mg, Oral, Daily  ? ? ?   ?Objective:  ? Physical Exam ?BP 134/87 (BP Location: Left Arm, Patient Position: Sitting, Cuff Size: Normal)   Pulse 87   Temp 98.3 ?F (36.8 ?C) (Oral)   Resp 18   Ht 6' (1.829 m)   Wt (!) 336 lb 4 oz (152.5 kg)  SpO2 97%   BMI 45.60 kg/m?  ?General:   ?Well developed, NAD, BMI noted. ?HEENT:  ?Normocephalic . Face symmetric, atraumatic ?Lungs:  ?CTA B ?Normal respiratory effort, no intercostal retractions, no accessory muscle use. ?Heart: RRR,  no murmur.  ?DM foot exam: No edema, good pedal pulses, pinprick examination ?Skin: Not pale. Not jaundice ?Neurologic:  ?alert & oriented X3.  ?Speech normal, gait appropriate for age and unassisted ?Psych--  ?Cognition and judgment appear intact.  ?Cooperative with normal attention span and concentration.  ?Behavior appropriate. ?No anxious or depressed appearing.  ? ?   ?Assessment   ? ?Assessment ?DM-- dx 2014, took invokana, metformin, dc ~06-2014 as diet improved, a1c 13 (  08-2015), meds restarted ?HTN ?Dyslipidemia ?Gout ?Morbid obesity ?OSA - not using cpap as off 06-2021, intolerant. ?ADHD- per other provider  ?Insomnia ?H/o diverticulitis, sigmoid,w/ perf, conservative Rx 10-2014 , Admitted again 08/2015, abscess, s/p drainage. ?Tobacco- wellbutrin intolerant  ? ?PLAN: ?DM: Last A1c 7.7, worse than before, he continue with Jardiance, metformin and Rybelsus, was referred to endocrinology but referral failed.  States he has been busy.  He is eating a little better, no significant weight loss noted. ?Feet exam negative ?Plan: Continue present meds, strongly recommend a more structured diet such as Noom.  Continue checking ambulatory CBGs ?RAX:ENMMHWKG losartan, recent BMP okay. ?Dyslipidemia: Last triglycerides were over 638, was rec to continue Lipitor and fenofibrate, will recheck today.  He is fasting.  ?Neck pain: Was seen by sports medicine, they Rx a MRI cervical April 2023: DJD, moderate canal stenosis, L C5-6 and  R C6-7 neuroforaminal narrowing. S/p epidural, doing better.  Temporarily took gabapentin. ?RTC 4-month?  ?

## 2021-10-30 LAB — LIPID PANEL
Cholesterol: 146 mg/dL (ref 0–200)
HDL: 32.8 mg/dL — ABNORMAL LOW (ref >39.00)
LDL Cholesterol: 87 mg/dL (ref 0–99)
NonHDL: 113.47
Total CHOL/HDL Ratio: 4
Triglycerides: 133 mg/dL (ref 0.0–149.0)
VLDL: 26.6 mg/dL (ref 0.0–40.0)

## 2021-10-30 NOTE — Assessment & Plan Note (Signed)
DM: Last A1c 7.7, worse than before, he continue with Jardiance, metformin and Rybelsus, was referred to endocrinology but referral failed.  States he has been busy.  He is eating a little better, no significant weight loss noted. ?Feet exam negative ?Plan: Continue present meds, strongly recommend a more structured diet such as Noom.  Continue checking ambulatory CBGs ?QXI:HWTUUEKC losartan, recent BMP okay. ?Dyslipidemia: Last triglycerides were over 638, was rec to continue Lipitor and fenofibrate, will recheck today.  He is fasting.  ?Neck pain: Was seen by sports medicine, they Rx a MRI cervical April 2023: DJD, moderate canal stenosis, L C5-6 and  R C6-7 neuroforaminal narrowing. S/p epidural, doing better.  Temporarily took gabapentin. ?RTC 27-month?

## 2021-11-15 ENCOUNTER — Other Ambulatory Visit: Payer: Self-pay | Admitting: Internal Medicine

## 2021-12-23 ENCOUNTER — Other Ambulatory Visit: Payer: Self-pay | Admitting: Internal Medicine

## 2022-02-24 NOTE — Progress Notes (Unsigned)
Name: Spencer Good  MRN/ DOB: [id number], 10-Mar-1976   Age/ Sex: 46 y.o., male    PCP: Colon Branch, MD   Reason for Endocrinology Evaluation: Type 2 Diabetes Mellitus     Date of Initial Endocrinology Visit: 02/25/2022     PATIENT IDENTIFIER: [name] is a 46 y.o. male with a past medical history of T2DM and Dyslipidemia . The patient presented for initial endocrinology clinic visit on 02/25/2022 for consultative assistance with his diabetes management.    HPI: Spencer Good was    Diagnosed with DM 2014 Prior Medications tried/Intolerance: Ozempic - didn't remember  Currently checking blood sugars occasionally  Hypoglycemia episodes : no              Hemoglobin A1c has ranged from 7.2% in 2022, peaking at 13.9% in 2017. Patient has required hospitalization within the last 1 year from hyper or hypoglycemia: no  In terms of diet, the patient eats 2 meals a day, snacks occasionally  Avoids sugar-sweetened beverages    Data analyst  HOME DIABETES REGIMEN: Metformin 850  mg TID  Jardiance 25 mg daily  Rybelsus 14 mg daily      Statin: yes ACE-I/ARB: yes   METER DOWNLOAD SUMMARY: Did not   DIABETIC COMPLICATIONS: Microvascular complications:   Denies: CKD, retinopathy  Last eye exam: Completed 2022  Macrovascular complications:   Denies: CAD, PVD, CVA   PAST HISTORY: Past Medical History:  Past Medical History:  Diagnosis Date   ADHD (attention deficit hyperactivity disorder)    Diabetes ([place])    Diverticulitis of colon with perforation    s/p sigmoid colectomy   Gout    Hernia, umbilical 2671   Laparoscopic umbilical hernia repair with mesh- s/p repair-resolved.   History of colon polyps    Hyperlipidemia    Hypertension    Insomnia    Obesity    OSA (obstructive sleep apnea) dx 05-2013   Rx a Cpap 3-15, can't use - no longer has machine.   Sleep apnea    doesn't use CPAP   Past Surgical History:  Past Surgical History:  Procedure  Laterality Date   COLONOSCOPY     EYE SURGERY     INSERTION OF MESH N/A 02/01/2015   Procedure: INSERTION OF MESH;  Surgeon: Rolm Bookbinder, MD;  Location: [place];  Service: General;  Laterality: N/A;   LAPAROSCOPIC SIGMOID COLECTOMY N/A 05/24/2016   Procedure: LAPAROSCOPIC SIGMOID COLECTOMY;  Surgeon: Excell Seltzer, MD;  [facility];  Service: General;  Laterality: N/A;   LASIK Bilateral    RADIOLOGY WITH ANESTHESIA N/A 09/20/2021   Procedure: MRI WITH CERVICAL SPINE WITHOUT CONTRAST;  Surgeon: Radiologist, Medication, MD;  Location: [place];  Service: Radiology;  Laterality: N/A;   SHOULDER ARTHROSCOPY Left 04/24/2020   Procedure: LEFT SHOULDER MANIPULATION UNDER ANESTHESIA, ROTATOR INTERVAL RELEASE, DEBRIDEMENT, BICEPS TENODESIS;  Surgeon: Meredith Pel, MD;  Location: [place];  Service: Orthopedics;  Laterality: Left;   UMBILICAL HERNIA REPAIR N/A 02/01/2015   Procedure: LAPAROSCOPIC UMBILICAL HERNIA REPAIR WITH MESH;  Surgeon: Rolm Bookbinder, MD;  Location: [place];  Service: General;  Laterality: N/A;    Social History:  reports that he quit smoking about 7 years ago. His smoking use included cigarettes. He started smoking about 29 years ago. He has a 20.00 pack-year smoking history. He has never used smokeless tobacco. He reports current alcohol use. He reports that he does not use drugs. Family History:  Family History  Problem Relation  Age of Onset   Stroke Father        F, PGF   CAD Other        MGF   Heart disease Maternal Grandfather    Colon cancer Neg Hx    Prostate cancer Neg Hx    Diabetes Neg Hx        distant fam members   Colon polyps Neg Hx    Esophageal cancer Neg Hx    Gallbladder disease Neg Hx    Kidney disease Neg Hx    Rectal cancer Neg Hx    Stomach cancer Neg Hx      HOME MEDICATIONS: Allergies as of 02/25/2022   No Known Allergies      Medication List        Accurate as of February 25, 2022 11:56 AM. If you have any questions, ask  your nurse or doctor.          Accu-Chek Guide test strip Generic drug: glucose blood Check blood sugars once daily   atorvastatin 20 MG tablet Commonly known as: LIPITOR TAKE 1 TABLET(20 MG) BY MOUTH AT BEDTIME   fenofibrate 160 MG tablet TAKE 1 TABLET(160 MG) BY MOUTH DAILY   Jardiance 25 MG Tabs tablet Generic drug: empagliflozin TAKE 1 TABLET(25 MG) BY MOUTH DAILY   losartan 100 MG tablet Commonly known as: COZAAR TAKE 1 TABLET(100 MG) BY MOUTH DAILY   metFORMIN 850 MG tablet Commonly known as: GLUCOPHAGE Take 1 tablet (850 mg total) by mouth with breakfast, with lunch, and with evening meal.   multivitamin tablet Take 1 tablet by mouth daily.   PROBIOTIC PO Take 1 tablet by mouth daily.   psyllium 58.6 % powder Commonly known as: METAMUCIL Take 1 packet by mouth daily as needed (constipation).   Rybelsus 14 MG Tabs Generic drug: Semaglutide TAKE 1 TABLET BY MOUTH DAILY   Vyvanse 50 MG capsule Generic drug: lisdexamfetamine Take 50 mg by mouth daily.         ALLERGIES: No Known Allergies   REVIEW OF SYSTEMS: A comprehensive ROS was conducted with the patient and is negative except as per HPI and below:  ROS    OBJECTIVE:   VITAL SIGNS: BP 124/88 (BP Location: Left Arm, Patient Position: Sitting, Cuff Size: Large)   Pulse 100   Ht 6' (1.829 m)   Wt (!) 341 lb 12.8 oz (155 kg)   SpO2 95%   BMI 46.36 kg/m    PHYSICAL EXAM:  General: Pt appears well and is in NAD  Neck: General: Supple without adenopathy or carotid bruits. Thyroid: Thyroid size normal.  No goiter or nodules appreciated.   Lungs: Clear with good BS bilat with no rales, rhonchi, or wheezes  Heart: RRR   Abdomen:  soft, nontender  Extremities:  Lower extremities - No pretibial edema. No lesions.  Neuro: MS is good with appropriate affect, pt is alert and Ox3    DM foot exam: 02/25/2022  The skin of the feet is without sores or ulcerations, planat callus formation  noted The pedal pulses are 2+ on right and 2+ on left. The sensation is intact to a screening 5.07, 10 gram monofilament bilaterally   DATA REVIEWED:  Lab Results  Component Value Date   HGBA1C 7.4 (H) 10/29/2021   HGBA1C 7.7 (H) 07/03/2021   HGBA1C 7.3 (H) 02/06/2021   Lab Results  Component Value Date   MICROALBUR <0.7 06/07/2019   LDLCALC 87 10/29/2021   CREATININE 0.87 09/20/2021  Lab Results  Component Value Date   MICRALBCREAT 1.0 06/07/2019    Lab Results  Component Value Date   CHOL 146 10/29/2021   HDL 32.80 (L) 10/29/2021   LDLCALC 87 10/29/2021   LDLDIRECT 81.0 07/03/2021   TRIG 133.0 10/29/2021   CHOLHDL 4 10/29/2021        ASSESSMENT / PLAN / RECOMMENDATIONS:   1) Type 2 Diabetes Mellitus, Sub-optimally controlled, Without complications - Most recent A1c of 7.4 %. Goal A1c < 7.0 %.     -I have discussed with the patient the pathophysiology of diabetes. We went over the natural progression of the disease. We stressed the importance of lifestyle changes. I explained the complications associated with diabetes including retinopathy, nephropathy, neuropathy as well as increased risk of cardiovascular disease. We went over the benefit seen with glycemic control.   -I explained to the patient that diabetic patients are at higher than normal risk for amputations.  - We discussed reason for hyperglycemia is due to post-prandial hyperglycemia, we discussed ways to reduce CHO intake per meal  - Encouraged brisk walking  - He used to be on Ozempic but he would forget to take it because just once weekly, I did discuss the superiority of Mounjaro , and my preference to switch Rybelsus to [facility] -We also discussed the option of continuing Rybelsus, metformin, Jardiance but adding glipizide -We have opted to switch Rybelsus to [facility] for now   MEDICATIONS: Stop Rybelsus Start Mounjaro 5 mg once weekly Continue metformin 850 mg 3 times daily daily Continue  Jardiance 25 mg daily  EDUCATION / INSTRUCTIONS: BG monitoring instructions: Patient is instructed to check his blood sugars 2-3 times a week. Call [place] [facility] clinic if: BG persistently < 70  I reviewed the Rule of 15 for the treatment of hypoglycemia in detail with the patient. Literature supplied.   2) Diabetic complications:  Eye: Does not have known diabetic retinopathy.  Neuro/ Feet: Does not have known diabetic peripheral neuropathy. Renal: Patient does not have known baseline CKD. He is  on an ACEI/ARB at present.           Signed electronically by: Mack Guise, MD  [facility] Endocrinology  [facility] [address] [place], [place] [address] Phone: [phone] FAX: [phone]   CC: Colon Branch, [address] [place] [address] Phone: [phone]  Fax: [phone]    Return to Endocrinology clinic as below: Future Appointments  Date Time Provider [place]  04/09/2022  8:40 AM Colon Branch, MD [id number]

## 2022-02-25 ENCOUNTER — Encounter: Payer: Self-pay | Admitting: Internal Medicine

## 2022-02-25 ENCOUNTER — Ambulatory Visit (INDEPENDENT_AMBULATORY_CARE_PROVIDER_SITE_OTHER): Payer: BC Managed Care – PPO | Admitting: Internal Medicine

## 2022-02-25 VITALS — BP 124/88 | HR 100 | Ht 72.0 in | Wt 341.8 lb

## 2022-02-25 DIAGNOSIS — E1165 Type 2 diabetes mellitus with hyperglycemia: Secondary | ICD-10-CM | POA: Diagnosis not present

## 2022-02-25 LAB — POCT GLUCOSE (DEVICE FOR HOME USE): POC Glucose: 162 mg/dl — AB (ref 70–99)

## 2022-02-25 LAB — POCT GLYCOSYLATED HEMOGLOBIN (HGB A1C): Hemoglobin A1C: 7.4 % — AB (ref 4.0–5.6)

## 2022-02-25 MED ORDER — METFORMIN HCL 850 MG PO TABS: 850.0000 mg | ORAL_TABLET | Freq: Three times a day (TID) | ORAL | 3 refills | Status: DC

## 2022-02-25 MED ORDER — EMPAGLIFLOZIN 25 MG PO TABS
25.0000 mg | ORAL_TABLET | Freq: Every day | ORAL | 3 refills | Status: DC
Start: 2022-02-25 — End: 2023-01-16

## 2022-02-25 MED ORDER — TIRZEPATIDE 5 MG/0.5ML ~~LOC~~ SOAJ: 5.0000 mg | SUBCUTANEOUS | 1 refills | Status: DC

## 2022-02-25 NOTE — Patient Instructions (Signed)
I will attempt to switch Rybelsus to Mounjaro 5 mg ONCE weekly  Continue Metformin 850 mg , three times daily  Continue Jardiance 25 mg daily        HOW TO TREAT LOW BLOOD SUGARS (Blood sugar LESS THAN 70 MG/DL) Please follow the RULE OF 15 for the treatment of hypoglycemia treatment (when your (blood sugars are less than 70 mg/dL)   STEP 1: Take 15 grams of carbohydrates when your blood sugar is low, which includes:  3-4 GLUCOSE TABS  OR 3-4 OZ OF JUICE OR REGULAR SODA OR ONE TUBE OF GLUCOSE GEL    STEP 2: RECHECK blood sugar in 15 MINUTES STEP 3: If your blood sugar is still low at the 15 minute recheck --> then, go back to STEP 1 and treat AGAIN with another 15 grams of carbohydrates.

## 2022-03-08 ENCOUNTER — Other Ambulatory Visit: Payer: Self-pay | Admitting: Internal Medicine

## 2022-03-08 ENCOUNTER — Encounter: Payer: Self-pay | Admitting: Internal Medicine

## 2022-03-11 ENCOUNTER — Other Ambulatory Visit (HOSPITAL_COMMUNITY): Payer: Self-pay

## 2022-03-11 ENCOUNTER — Telehealth: Payer: Self-pay

## 2022-03-11 ENCOUNTER — Other Ambulatory Visit: Payer: Self-pay | Admitting: Internal Medicine

## 2022-03-11 MED ORDER — RYBELSUS 14 MG PO TABS: 14.0000 mg | ORAL_TABLET | Freq: Every day | ORAL | 3 refills | Status: DC

## 2022-03-11 NOTE — Telephone Encounter (Signed)
Patient Advocate Encounter  Prior Authorization for Rybelsus '14MG'$  tablets has been approved.    Key:  [id number] Effective dates: 03/11/22 through 03/11/23

## 2022-03-11 NOTE — Telephone Encounter (Signed)
Patient Advocate Encounter   Received notification that prior authorization for Rybelsus '14MG'$  tablets is required/requested.    PA submitted on 03/11/22 to [employer] via CoverMyMeds  Key [id number]  Status is pending

## 2022-03-26 ENCOUNTER — Encounter: Payer: Self-pay | Admitting: Internal Medicine

## 2022-03-26 DIAGNOSIS — Z7984 Long term (current) use of oral hypoglycemic drugs: Secondary | ICD-10-CM | POA: Diagnosis not present

## 2022-03-26 DIAGNOSIS — E119 Type 2 diabetes mellitus without complications: Secondary | ICD-10-CM | POA: Diagnosis not present

## 2022-03-26 LAB — HM DIABETES EYE EXAM

## 2022-03-28 ENCOUNTER — Other Ambulatory Visit (HOSPITAL_COMMUNITY): Payer: Self-pay

## 2022-03-28 ENCOUNTER — Telehealth: Payer: Self-pay

## 2022-03-28 NOTE — Telephone Encounter (Signed)
Gerald Stabs from [place] is calling about having a prior authorization back dated on patient.  CB: [phone].

## 2022-03-28 NOTE — Telephone Encounter (Addendum)
Gerald Stabs called back. He's calling on behalf of the pt's previous ins. Advised him to contact the pt's pharmacy to follow up with his previous provider. Pt's meds were billed to his old ins, but won't go through his new ins b/c we didn't have a PA in June.   Just in case it's needed, a ref # was given: [id number]

## 2022-03-28 NOTE — Telephone Encounter (Signed)
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 Left a msg that I was returning his call. Did not give the pt's name. He asked for a ref # when you leave a msg, but I don't have one.

## 2022-04-09 ENCOUNTER — Encounter: Payer: Self-pay | Admitting: Internal Medicine

## 2022-04-09 ENCOUNTER — Ambulatory Visit (INDEPENDENT_AMBULATORY_CARE_PROVIDER_SITE_OTHER): Payer: BC Managed Care – PPO | Admitting: Internal Medicine

## 2022-04-09 VITALS — BP 136/74 | HR 107 | Temp 98.1°F | Resp 16 | Ht 72.0 in | Wt 343.4 lb

## 2022-04-09 DIAGNOSIS — E1165 Type 2 diabetes mellitus with hyperglycemia: Secondary | ICD-10-CM

## 2022-04-09 DIAGNOSIS — Z23 Encounter for immunization: Secondary | ICD-10-CM

## 2022-04-09 LAB — BASIC METABOLIC PANEL
BUN: 21 mg/dL (ref 6–23)
CO2: 27 mEq/L (ref 19–32)
Calcium: 10.1 mg/dL (ref 8.4–10.5)
Chloride: 100 mEq/L (ref 96–112)
Creatinine, Ser: 0.93 mg/dL (ref 0.40–1.50)
GFR: 100.1 mL/min (ref >60.00)
Glucose, Bld: 184 mg/dL — ABNORMAL HIGH (ref 70–99)
Potassium: 4.5 mEq/L (ref 3.5–5.1)
Sodium: 136 mEq/L (ref 135–145)

## 2022-04-09 NOTE — Progress Notes (Unsigned)
Subjective:    Patient ID: Spencer Good, male    DOB: 1975/12/22, 46 y.o.   MRN: [id number]  DOS:  04/09/2022 Type of visit - description: Follow-up Here for follow-up Saw endocrinology, note reviewed. He is trying to do a little better with lifestyle but recognizes there is a still room for improvement. Had questions about vaccines Wt Readings from Last 3 Encounters:  04/09/22 (!) 343 lb 6 oz (155.8 kg)  02/25/22 (!) 341 lb 12.8 oz (155 kg)  10/29/21 (!) 336 lb 4 oz (152.5 kg)     Review of Systems See above   Past Medical History:  Diagnosis Date   ADHD (attention deficit hyperactivity disorder)    Diabetes ([place])    Diverticulitis of colon with perforation    s/p sigmoid colectomy   Gout    Hernia, umbilical 8250   Laparoscopic umbilical hernia repair with mesh- s/p repair-resolved.   History of colon polyps    Hyperlipidemia    Hypertension    Insomnia    Obesity    OSA (obstructive sleep apnea) dx 05-2013   Rx a Cpap 3-15, can't use - no longer has machine.   Sleep apnea    doesn't use CPAP    Past Surgical History:  Procedure Laterality Date   COLONOSCOPY     EYE SURGERY     INSERTION OF MESH N/A 02/01/2015   Procedure: INSERTION OF MESH;  Surgeon: Rolm Bookbinder, MD;  Location: [place];  Service: General;  Laterality: N/A;   LAPAROSCOPIC SIGMOID COLECTOMY N/A 05/24/2016   Procedure: LAPAROSCOPIC SIGMOID COLECTOMY;  Surgeon: Excell Seltzer, MD;  [facility];  Service: General;  Laterality: N/A;   LASIK Bilateral    RADIOLOGY WITH ANESTHESIA N/A 09/20/2021   Procedure: MRI WITH CERVICAL SPINE WITHOUT CONTRAST;  Surgeon: Radiologist, Medication, MD;  Location: [place];  Service: Radiology;  Laterality: N/A;   SHOULDER ARTHROSCOPY Left 04/24/2020   Procedure: LEFT SHOULDER MANIPULATION UNDER ANESTHESIA, ROTATOR INTERVAL RELEASE, DEBRIDEMENT, BICEPS TENODESIS;  Surgeon: Meredith Pel, MD;  Location: [place];  Service: Orthopedics;  Laterality: Left;    UMBILICAL HERNIA REPAIR N/A 02/01/2015   Procedure: LAPAROSCOPIC UMBILICAL HERNIA REPAIR WITH MESH;  Surgeon: Rolm Bookbinder, MD;  Location: [place];  Service: General;  Laterality: N/A;    Current Outpatient Medications  Medication Instructions   atorvastatin (LIPITOR) 20 mg, Oral, Daily   empagliflozin (JARDIANCE) 25 mg, Oral, Daily   fenofibrate 160 MG tablet TAKE 1 TABLET(160 MG) BY MOUTH DAILY   glucose blood (ACCU-CHEK GUIDE) test strip Check blood sugars once daily   losartan (COZAAR) 100 mg, Oral, Daily   metFORMIN (GLUCOPHAGE) 850 mg, Oral, 3 times daily with meals   Multiple Vitamin (MULTIVITAMIN) tablet 1 tablet, Oral, Daily   Probiotic Product (PROBIOTIC PO) 1 tablet, Oral, Daily   psyllium (METAMUCIL) 58.6 % powder 1 packet, Oral, Daily PRN   Rybelsus 14 mg, Oral, Daily   Vyvanse 50 mg, Oral, Daily       Objective:   Physical Exam BP 136/74   Pulse (!) 107   Temp 98.1 F (36.7 C) (Oral)   Resp 16   Ht 6' (1.829 m)   Wt (!) 343 lb 6 oz (155.8 kg)   SpO2 97%   BMI 46.57 kg/m  General:   Well developed, NAD, BMI noted. HEENT:  Normocephalic . Face symmetric, atraumatic Lungs:  CTA B Normal respiratory effort, no intercostal retractions, no accessory muscle use. Heart: RRR, slightly tachycardic, no murmur.  Lower extremities: no pretibial edema bilaterally  Skin: Not pale. Not jaundice Neurologic:  alert & oriented X3.  Speech normal, gait appropriate for age and unassisted Psych--  Cognition and judgment appear intact.  Cooperative with normal attention span and concentration.  Behavior appropriate. No anxious or depressed appearing.      Assessment    Assessment DM -- dx 2014, took invokana, metformin, dc ~06-2014 as diet improved, a1c 13 ( 08-2015), meds restarted --Sees Endo HTN Dyslipidemia Gout Morbid obesity OSA - not using cpap as off 06-2021, intolerant. ADHD- per other provider  Insomnia H/o diverticulitis, sigmoid,w/ perf, conservative  Rx 10-2014 , Admitted again 08/2015, abscess, s/p drainage. Tobacco- wellbutrin intolerant   PLAN: DM: Saw Endo 02/25/2022, A1c was 7.4.  Was rec to switch from Rybelsus to [facility] but is still on Rybelsus due to insurance issue.  Also on metformin and Jardiance. With about diet, exercise, he has 2 medications on board and will hands his efforts to lose weight. HTN: BP is very good, he is slightly tachycardic, recommend to monitor vital signs, continue losartan, check BMP. Dyslipidemia:Last FLP satisfactory, continue Lipitor and fenofibrate. Vaccine advice: Flu shot today, recommend to proceed with the COVID-vaccine. RTC CPX 4 months 5- DM: Last A1c 7.7, worse than before, he continue with Jardiance, metformin and Rybelsus, was referred to endocrinology but referral failed.  States he has been busy.  He is eating a little better, no significant weight loss noted. Feet exam negative Plan: Continue present meds, strongly recommend a more structured diet such as Noom.  Continue checking ambulatory CBGs NBV:APOLIDCV losartan, recent BMP okay. Dyslipidemia: Last triglycerides were over 638, was rec to continue Lipitor and fenofibrate, will recheck today.  He is fasting.  Neck pain: Was seen by sports medicine, they Rx a MRI cervical April 2023: DJD, moderate canal stenosis, L C5-6 and  R C6-7 neuroforaminal narrowing. S/p epidural, doing better.  Temporarily took gabapentin. RTC 55-month

## 2022-04-09 NOTE — Patient Instructions (Signed)
Check the  blood pressure regularly BP GOAL is between 110/65 and  135/85. If it is consistently higher or lower, let me know  You may also like to check your heart rate, should be less than 100.  Watch your diet closely, you are taking 2 medications that will help you lose weight, get healthier and get your diabetes under control.     GO TO THE LAB : Get the blood work     GO TO THE FRONT DESK, PLEASE SCHEDULE YOUR APPOINTMENTS Come back for physical exam in 4 months

## 2022-04-10 NOTE — Assessment & Plan Note (Signed)
DM: Saw Endo 02/25/2022, A1c was 7.4.  Was rec to switch from Rybelsus to [facility] but is still on Rybelsus due to insurance issue.  Also on metformin and Jardiance. Advised to do his best with a healthier diet and routine exercise, 2 of the  medication he takes will enhance his efforts to lose weight. Morbid obesity: See above HTN: BP is very good, he is slightly tachycardic, recommend to monitor vital signs, continue losartan, check BMP. Dyslipidemia:Last FLP satisfactory, continue Lipitor and fenofibrate. Vaccine advice: Flu shot today, recommend to proceed with the COVID-vaccine. RTC CPX 4 months

## 2022-04-26 DIAGNOSIS — Z79899 Other long term (current) drug therapy: Secondary | ICD-10-CM | POA: Diagnosis not present

## 2022-04-26 DIAGNOSIS — F902 Attention-deficit hyperactivity disorder, combined type: Secondary | ICD-10-CM | POA: Diagnosis not present

## 2022-05-01 NOTE — Progress Notes (Unsigned)
   I, Peterson Lombard, LAT, ATC acting as a scribe for Lynne Leader, MD.  Spencer Good is a 46 y.o. male who presents to [place] [facility] today for elbow pain. Pt was previously seen by Dr. Georgina Snell on 08/23/21 for cervical radiculopathy. Today, pt c/o elbow pain x /. Pt locates pain to   Elbow swelling: Grip strength: Paresthesia: Aggravates: Treatments tried:  Pertinent review of systems: ***  Relevant historical information: ***   Exam:  There were no vitals taken for this visit. General: Well Developed, well nourished, and in no acute distress.   MSK: ***    Lab and Radiology Results No results found for this or any previous visit (from the past 72 hour(s)). No results found.     Assessment and Plan: 46 y.o. male with ***   PDMP not reviewed this encounter. No orders of the defined types were placed in this encounter.  No orders of the defined types were placed in this encounter.    Discussed warning signs or symptoms. Please see discharge instructions. Patient expresses understanding.   ***

## 2022-05-02 ENCOUNTER — Ambulatory Visit (INDEPENDENT_AMBULATORY_CARE_PROVIDER_SITE_OTHER): Payer: BC Managed Care – PPO | Admitting: Family Medicine

## 2022-05-02 ENCOUNTER — Ambulatory Visit: Payer: Self-pay

## 2022-05-02 VITALS — BP 144/88 | HR 93 | Ht 72.0 in | Wt 344.0 lb

## 2022-05-02 DIAGNOSIS — M7712 Lateral epicondylitis, left elbow: Secondary | ICD-10-CM | POA: Diagnosis not present

## 2022-05-02 DIAGNOSIS — M25522 Pain in left elbow: Secondary | ICD-10-CM | POA: Diagnosis not present

## 2022-05-02 MED ORDER — NITROGLYCERIN 0.2 MG/HR TD PT24: 0.2000 mg | MEDICATED_PATCH | Freq: Every day | TRANSDERMAL | 1 refills | Status: DC

## 2022-05-02 NOTE — Patient Instructions (Addendum)
Thank you for coming in today.   Please complete the exercises that the athletic trainer went over with you:  View at [email or web address] using code: [email or web address]  Nitroglycerin Protocol Apply 1/4 nitroglycerin patch to affected area daily. Change position of patch within the affected area every 24 hours. You may experience a headache during the first 1-2 weeks of using the patch, these should subside. If you experience headaches after beginning nitroglycerin patch treatment, you may take your preferred over the counter pain reliever. Another side effect of the nitroglycerin patch is skin irritation or rash related to patch adhesive. Please notify our office if you develop more severe headaches or rash, and stop the patch. Tendon healing with nitroglycerin patch may require 12 to 24 weeks depending on the extent of injury. Men should not use if taking Viagra, Cialis, or Levitra.  Do not use if you have migraines or rosacea.    Check back in 1 month

## 2022-05-15 ENCOUNTER — Other Ambulatory Visit: Payer: Self-pay | Admitting: Internal Medicine

## 2022-07-17 ENCOUNTER — Encounter: Payer: Self-pay | Admitting: Internal Medicine

## 2022-07-17 ENCOUNTER — Ambulatory Visit (INDEPENDENT_AMBULATORY_CARE_PROVIDER_SITE_OTHER): Payer: BC Managed Care – PPO | Admitting: Internal Medicine

## 2022-07-17 ENCOUNTER — Telehealth: Payer: Self-pay | Admitting: Internal Medicine

## 2022-07-17 VITALS — BP 124/86 | HR 93 | Wt 344.6 lb

## 2022-07-17 DIAGNOSIS — E1165 Type 2 diabetes mellitus with hyperglycemia: Secondary | ICD-10-CM

## 2022-07-17 LAB — POCT GLYCOSYLATED HEMOGLOBIN (HGB A1C): Hemoglobin A1C: 7.9 % — AB (ref 4.0–5.6)

## 2022-07-17 MED ORDER — GLIMEPIRIDE 1 MG PO TABS: 1.0000 mg | ORAL_TABLET | Freq: Every day | ORAL | 2 refills | Status: DC

## 2022-07-17 MED ORDER — TIRZEPATIDE 5 MG/0.5ML ~~LOC~~ SOAJ: 5.0000 mg | SUBCUTANEOUS | 3 refills | Status: DC

## 2022-07-17 NOTE — Telephone Encounter (Signed)
Can you please proceed with PA for Spencer Good, Spencer Good A1c is trending up despite being on highest dose of Rybelsus     Thanks

## 2022-07-17 NOTE — Patient Instructions (Signed)
Start Glimepiride 1 mg, 1 tablet before Breakfast  Continue Metformin 850 mg , three times daily  Continue Jardiance 25 mg daily  We will attempt to switch Rybelsus to Mounjaro 5 mg ONCE weekly       HOW TO TREAT LOW BLOOD SUGARS (Blood sugar LESS THAN 70 MG/DL) Please follow the RULE OF 15 for the treatment of hypoglycemia treatment (when your (blood sugars are less than 70 mg/dL)   STEP 1: Take 15 grams of carbohydrates when your blood sugar is low, which includes:  3-4 GLUCOSE TABS  OR 3-4 OZ OF JUICE OR REGULAR SODA OR ONE TUBE OF GLUCOSE GEL    STEP 2: RECHECK blood sugar in 15 MINUTES STEP 3: If your blood sugar is still low at the 15 minute recheck --> then, go back to STEP 1 and treat AGAIN with another 15 grams of carbohydrates.

## 2022-07-17 NOTE — Progress Notes (Signed)
Name: Spencer Good  MRN/ DOB: [id number], 27-Feb-1976   Age/ Sex: 47 y.o., male    PCP: Colon Branch, MD   Reason for Endocrinology Evaluation: Type 2 Diabetes Mellitus     Date of Initial Endocrinology Visit: 07/17/2022     PATIENT IDENTIFIER: Spencer Good is a 47 y.o. male with a past medical history of T2DM, OSA and Dyslipidemia . The patient presented for initial endocrinology clinic visit on 07/17/2022 for consultative assistance with his diabetes management.    HPI: Spencer Good was    Diagnosed with DM 2014 Prior Medications tried/Intolerance: Ozempic - didn't remember  Currently checking blood sugars occasionally  Hypoglycemia episodes : no              Hemoglobin A1c has ranged from 7.2% in 2022, peaking at 13.9% in 2017. Patient has required hospitalization within the last 1 year from hyper or hypoglycemia: no  In terms of diet, the patient eats 2 meals a day, snacks occasionally  Avoids sugar-sweetened beverages    Data analyst  On his initial visit to our clinic his A1c was 7.4%, he was on metformin, Jardiance, and Rybelsus.  We switched Rybelsus to Mounjaro SUBJECTIVE:   During the last visit (02/25/2022): A1c 7.4%  Today (07/17/22): Spencer Good is here for follow-up on diabetes management.  He  checks his blood sugars occasionally daily. The patient has not had hypoglycemic episodes since the last clinic visit   Denies nausea, vomiting ut has loose bowels a couple of days ago due to eating work lunch   [place]: Metformin 850  mg 2 tabs Qam and 1 tab QPM Jardiance 25 mg daily  Rybelsus 14 mg daily      Statin: yes ACE-I/ARB: yes   METER DOWNLOAD SUMMARY: Did not  bring      DIABETIC COMPLICATIONS: Microvascular complications:   Denies: CKD, retinopathy  Last eye exam: Completed 2023  Macrovascular complications:   Denies: CAD, PVD, CVA   PAST HISTORY: Past Medical History:  Past Medical History:  Diagnosis Date    ADHD (attention deficit hyperactivity disorder)    Diabetes ([place])    Diverticulitis of colon with perforation    s/p sigmoid colectomy   Gout    Hernia, umbilical 0923   Laparoscopic umbilical hernia repair with mesh- s/p repair-resolved.   History of colon polyps    Hyperlipidemia    Hypertension    Insomnia    Obesity    OSA (obstructive sleep apnea) dx 05-2013   Rx a Cpap 3-15, can't use - no longer has machine.   Sleep apnea    doesn't use CPAP   Past Surgical History:  Past Surgical History:  Procedure Laterality Date   COLONOSCOPY     EYE SURGERY     INSERTION OF MESH N/A 02/01/2015   Procedure: INSERTION OF MESH;  Surgeon: Rolm Bookbinder, MD;  Location: [place];  Service: General;  Laterality: N/A;   LAPAROSCOPIC SIGMOID COLECTOMY N/A 05/24/2016   Procedure: LAPAROSCOPIC SIGMOID COLECTOMY;  Surgeon: Excell Seltzer, MD;  Location: [facility];  Service: General;  Laterality: N/A;   LASIK Bilateral    RADIOLOGY WITH ANESTHESIA N/A 09/20/2021   Procedure: MRI WITH CERVICAL SPINE WITHOUT CONTRAST;  Surgeon: Radiologist, Medication, MD;  Location: [place];  Service: Radiology;  Laterality: N/A;   SHOULDER ARTHROSCOPY Left 04/24/2020   Procedure: LEFT SHOULDER MANIPULATION UNDER ANESTHESIA, ROTATOR INTERVAL RELEASE, DEBRIDEMENT, BICEPS TENODESIS;  Surgeon: Meredith Pel, MD;  Location[facility];  Service: Orthopedics;  Laterality: Left;   UMBILICAL HERNIA REPAIR N/A 02/01/2015   Procedure: LAPAROSCOPIC UMBILICAL HERNIA REPAIR WITH MESH;  Surgeon: Rolm Bookbinder, MD;  Location: [place];  Service: General;  Laterality: N/A;    Social History:  reports that he quit smoking about 7 years ago. His smoking use included cigarettes. He started smoking about 29 years ago. He has a 20.00 pack-year smoking history. He has never used smokeless tobacco. He reports current alcohol use. He reports that he does not use drugs. Family History:  Family History  Problem Relation Age of Onset   Stroke  Father        F, PGF   CAD Other        MGF   Heart disease Maternal Grandfather    Colon cancer Neg Hx    Prostate cancer Neg Hx    Diabetes Neg Hx        distant fam members   Colon polyps Neg Hx    Esophageal cancer Neg Hx    Gallbladder disease Neg Hx    Kidney disease Neg Hx    Rectal cancer Neg Hx    Stomach cancer Neg Hx      HOME MEDICATIONS: Allergies as of 07/17/2022   No Known Allergies      Medication List        Accurate as of July 17, 2022  8:23 AM. If you have any questions, ask your nurse or doctor.          Accu-Chek Guide test strip Generic drug: glucose blood Check blood sugars once daily   atorvastatin 20 MG tablet Commonly known as: LIPITOR Take 1 tablet (20 mg total) by mouth daily.   empagliflozin 25 MG Tabs tablet Commonly known as: Jardiance Take 1 tablet (25 mg total) by mouth daily.   fenofibrate 160 MG tablet Take 1 tablet (160 mg total) by mouth daily.   losartan 100 MG tablet Commonly known as: COZAAR Take 1 tablet (100 mg total) by mouth daily.   metFORMIN 850 MG tablet Commonly known as: GLUCOPHAGE Take 1 tablet (850 mg total) by mouth with breakfast, with lunch, and with evening meal.   multivitamin tablet Take 1 tablet by mouth daily.   nitroGLYCERIN 0.2 mg/hr patch Commonly known as: NITRODUR - Dosed in mg/24 hr Place 1 patch (0.2 mg total) onto the skin daily. Place 1/4 of the patch over the affected area.   PROBIOTIC PO Take 1 tablet by mouth daily.   psyllium 58.6 % powder Commonly known as: METAMUCIL Take 1 packet by mouth daily as needed (constipation).   Rybelsus 14 MG Tabs Generic drug: Semaglutide Take 1 tablet (14 mg total) by mouth daily.   Vyvanse 50 MG capsule Generic drug: lisdexamfetamine Take 50 mg by mouth daily.         ALLERGIES: No Known Allergies   REVIEW OF SYSTEMS: A comprehensive ROS was conducted with the patient and is negative except as per HPI     OBJECTIVE:    VITAL SIGNS: BP 124/86   Pulse 93   Wt (!) 344 lb 9.6 oz (156.3 kg)   SpO2 94%   BMI 46.74 kg/m    PHYSICAL EXAM:  General: Pt appears well and is in NAD  Neck: General: Supple without adenopathy or carotid bruits. Thyroid: Thyroid size normal.  No goiter or nodules appreciated.   Lungs: Clear with good BS bilat with no rales, rhonchi, or wheezes  Heart: RRR  Abdomen:  soft, nontender  Extremities:  Lower extremities - No pretibial edema.   Neuro: MS is good with appropriate affect, pt is alert and Ox3    DM foot exam: 02/25/2022  The skin of the feet is without sores or ulcerations, planat callus formation noted The pedal pulses are 2+ on right and 2+ on left. The sensation is intact to a screening 5.07, 10 gram monofilament bilaterally   DATA REVIEWED:  Lab Results  Component Value Date   HGBA1C 7.9 (A) 07/17/2022   HGBA1C 7.4 (A) 02/25/2022   HGBA1C 7.4 (H) 10/29/2021   Lab Results  Component Value Date   MICROALBUR <0.7 06/07/2019   LDLCALC 87 10/29/2021   CREATININE 0.93 04/09/2022   Lab Results  Component Value Date   MICRALBCREAT 1.0 06/07/2019    Lab Results  Component Value Date   CHOL 146 10/29/2021   HDL 32.80 (L) 10/29/2021   LDLCALC 87 10/29/2021   LDLDIRECT 81.0 07/03/2021   TRIG 133.0 10/29/2021   CHOLHDL 4 10/29/2021        ASSESSMENT / PLAN / RECOMMENDATIONS:   1) Type 2 Diabetes Mellitus, Sub-optimally controlled, Without complications - Most recent A1c of 7.9 %. Goal A1c < 7.0 %.    - A1c trended up , most likely due dietary indiscretions through the holiday season  - His insurance did not approve Mounjaro , will try again this year, a message has been sent to our prior authorization team to start the process - Will also start Glimepiride as below  -He continues to work on lifestyle changes, he is not there yet, but he is motivated  MEDICATIONS: Start Glimepiride 1 mg, 1 tablet before Breakfast  Continue metformin 850 mg 3 times  daily daily Continue Jardiance 25 mg daily Will switch   EDUCATION / INSTRUCTIONS: BG monitoring instructions: Patient is instructed to check his blood sugars 2-3 times a week. Call [facility] [place] [facility] if: BG persistently < 70  I reviewed the Rule of 15 for the treatment of hypoglycemia in detail with the patient. Literature supplied.   2) Diabetic complications:  Eye: Does not have known diabetic retinopathy.  Neuro/ Feet: Does not have known diabetic peripheral neuropathy. Renal: Patient does not have known baseline CKD. He is  on an ACEI/ARB at present.      F/U in 6 months      Signed electronically by: Mack Guise, MD  [facility]  [facility] [address] [place], [place] [address] Phone: [phone] FAX: [phone]   CC: [facility], [address] [place] [address] Phone: [phone]  Fax: [phone]    Return to Endocrinology clinic as below: No future appointments.

## 2022-07-19 ENCOUNTER — Encounter: Payer: Self-pay | Admitting: Internal Medicine

## 2022-07-24 NOTE — Telephone Encounter (Signed)
Per patient message Darcel Bayley has been filled at pharmacy. PA not submitted.

## 2022-09-04 ENCOUNTER — Other Ambulatory Visit: Payer: Self-pay | Admitting: Internal Medicine

## 2022-10-06 ENCOUNTER — Other Ambulatory Visit: Payer: Self-pay | Admitting: Internal Medicine

## 2022-10-06 ENCOUNTER — Other Ambulatory Visit: Payer: Self-pay | Admitting: Family

## 2022-10-11 ENCOUNTER — Other Ambulatory Visit: Payer: Self-pay | Admitting: Internal Medicine

## 2022-10-11 ENCOUNTER — Other Ambulatory Visit (HOSPITAL_COMMUNITY): Payer: Self-pay

## 2022-10-11 ENCOUNTER — Other Ambulatory Visit (HOSPITAL_BASED_OUTPATIENT_CLINIC_OR_DEPARTMENT_OTHER): Payer: Self-pay

## 2022-10-11 ENCOUNTER — Encounter: Payer: Self-pay | Admitting: Internal Medicine

## 2022-10-11 MED ORDER — LISDEXAMFETAMINE DIMESYLATE 50 MG PO CAPS
50.0000 mg | ORAL_CAPSULE | Freq: Every day | ORAL | 0 refills | Status: DC
  Filled 2022-10-11: qty 30, 30d supply, fill #0

## 2022-10-14 ENCOUNTER — Other Ambulatory Visit (HOSPITAL_BASED_OUTPATIENT_CLINIC_OR_DEPARTMENT_OTHER): Payer: Self-pay

## 2022-10-14 ENCOUNTER — Encounter (HOSPITAL_BASED_OUTPATIENT_CLINIC_OR_DEPARTMENT_OTHER): Payer: Self-pay | Admitting: Pharmacist

## 2022-10-14 MED ORDER — TIRZEPATIDE 7.5 MG/0.5ML ~~LOC~~ SOAJ
7.5000 mg | SUBCUTANEOUS | 3 refills | Status: DC
  Filled 2022-10-14: qty 2, 28d supply, fill #0
  Filled 2022-11-06 – 2022-11-07 (×2): qty 2, 28d supply, fill #1
  Filled 2022-12-03: qty 2, 28d supply, fill #2
  Filled 2023-01-01: qty 2, 28d supply, fill #3

## 2022-10-14 MED ORDER — TIRZEPATIDE 5 MG/0.5ML ~~LOC~~ SOAJ
7.5000 mg | SUBCUTANEOUS | 3 refills | Status: DC
  Filled 2022-10-14: qty 2, 28d supply, fill #0

## 2022-10-22 DIAGNOSIS — Z79899 Other long term (current) drug therapy: Secondary | ICD-10-CM | POA: Diagnosis not present

## 2022-10-22 DIAGNOSIS — F902 Attention-deficit hyperactivity disorder, combined type: Secondary | ICD-10-CM | POA: Diagnosis not present

## 2022-10-22 DIAGNOSIS — G4733 Obstructive sleep apnea (adult) (pediatric): Secondary | ICD-10-CM | POA: Diagnosis not present

## 2022-11-05 ENCOUNTER — Other Ambulatory Visit: Payer: Self-pay | Admitting: Family

## 2022-11-06 ENCOUNTER — Other Ambulatory Visit (HOSPITAL_BASED_OUTPATIENT_CLINIC_OR_DEPARTMENT_OTHER): Payer: Self-pay

## 2022-11-07 ENCOUNTER — Encounter: Payer: Self-pay | Admitting: Internal Medicine

## 2022-11-07 ENCOUNTER — Other Ambulatory Visit: Payer: Self-pay

## 2022-11-07 ENCOUNTER — Other Ambulatory Visit (HOSPITAL_BASED_OUTPATIENT_CLINIC_OR_DEPARTMENT_OTHER): Payer: Self-pay

## 2022-11-07 MED ORDER — LOSARTAN POTASSIUM 100 MG PO TABS: 100.0000 mg | ORAL_TABLET | Freq: Every day | ORAL | 0 refills | Status: DC

## 2022-11-07 MED ORDER — ATORVASTATIN CALCIUM 20 MG PO TABS: 20.0000 mg | ORAL_TABLET | Freq: Every day | ORAL | 0 refills | Status: DC

## 2022-11-15 ENCOUNTER — Other Ambulatory Visit: Payer: Self-pay | Admitting: Internal Medicine

## 2022-11-24 ENCOUNTER — Telehealth: Payer: BC Managed Care – PPO | Admitting: Nurse Practitioner

## 2022-11-24 DIAGNOSIS — M5442 Lumbago with sciatica, left side: Secondary | ICD-10-CM

## 2022-11-24 MED ORDER — CYCLOBENZAPRINE HCL 10 MG PO TABS: 10.0000 mg | ORAL_TABLET | Freq: Three times a day (TID) | ORAL | 0 refills | Status: DC | PRN

## 2022-11-24 MED ORDER — PREDNISONE 20 MG PO TABS: 40.0000 mg | ORAL_TABLET | Freq: Every day | ORAL | 0 refills | Status: DC

## 2022-11-24 NOTE — Progress Notes (Signed)
Virtual Visit Consent   [name], you are scheduled for a virtual visit with a [facility] provider today. Just as with appointments in the office, your consent must be obtained to participate. Your consent will be active for this visit and any virtual visit you may have with one of our providers in the next 365 days. If you have a MyChart account, a copy of this consent can be sent to you electronically.  As this is a virtual visit, video technology does not allow for your provider to perform a traditional examination. This may limit your provider's ability to fully assess your condition. If your provider identifies any concerns that need to be evaluated in person or the need to arrange testing (such as labs, EKG, etc.), we will make arrangements to do so. Although advances in technology are sophisticated, we cannot ensure that it will always work on either your end or our end. If the connection with a video visit is poor, the visit may have to be switched to a telephone visit. With either a video or telephone visit, we are not always able to ensure that we have a secure connection.  By engaging in this virtual visit, you consent to the provision of healthcare and authorize for your insurance to be billed (if applicable) for the services provided during this visit. Depending on your insurance coverage, you may receive a charge related to this service.  I need to obtain your verbal consent now. Are you willing to proceed with your visit today? ALDRED MASE has provided verbal consent on 11/24/2022 for a virtual visit (video or telephone). Claiborne Rigg, NP  Date: 11/24/2022 12:40 PM  Virtual Visit via Video Note   I, Claiborne Rigg, connected with  Spencer Good  ([id number], June 18, 1975) on 11/24/22 at 12:30 PM EDT by a video-enabled telemedicine application and verified that I am speaking with the correct person using two identifiers.  Location: Patient: Virtual Visit Location Patient:  Home Provider: Virtual Visit Location Provider: Office/Clinic   I discussed the limitations of evaluation and management by telemedicine and the availability of in person appointments. The patient expressed understanding and agreed to proceed.    History of Present Illness: Spencer Good is a 47 y.o. who identifies as a male who was assigned male at birth, and is being seen today for low back pain with left sided sciatica.  Mr. Leeson states he injured his back last Thursday after helping move a washing machine and dryer last week. He is currently experiencing low back pain radiating down into his left leg. Pain described as tightness. States it has been hard to get comfortable. Denies any loss of bowel or bladder function   Problems:  Patient Active Problem List   Diagnosis Date Noted   Obstructive sleep apnea 10/05/2019   Dyslipidemia 07/09/2019   Annual physical exam 02/02/2019   Diverticulitis of sigmoid colon 05/24/2016   Morbid obesity (HCC) 04/25/2016   ADD (attention deficit disorder) 12/28/2015   Diverticulitis of large intestine with abscess without bleeding 09/13/2015   PCP NOTES >>>>>>>>>>>>>>>>>>>> 09/07/2015   Elevated WBCs 01/27/2014   Diabetes (HCC) 02/17/2013   Tachycardia 08/29/2011   TOBACCO USER 06/29/2010    Allergies: No Known Allergies Medications:  Current Outpatient Medications:    cyclobenzaprine (FLEXERIL) 10 MG tablet, Take 1 tablet (10 mg total) by mouth 3 (three) times daily as needed for muscle spasms., Disp: 30 tablet, Rfl: 0   predniSONE (DELTASONE) 20 MG tablet,  Take 2 tablets (40 mg total) by mouth daily with breakfast., Disp: 10 tablet, Rfl: 0   atorvastatin (LIPITOR) 20 MG tablet, Take 1 tablet (20 mg total) by mouth daily., Disp: 90 tablet, Rfl: 0   empagliflozin (JARDIANCE) 25 MG TABS tablet, Take 1 tablet (25 mg total) by mouth daily., Disp: 90 tablet, Rfl: 3   fenofibrate 160 MG tablet, TAKE 1 TABLET(160 MG) BY MOUTH DAILY, Disp: 90 tablet,  Rfl: 1   glimepiride (AMARYL) 1 MG tablet, Take 1 tablet (1 mg total) by mouth daily with breakfast., Disp: 90 tablet, Rfl: 2   glucose blood (ACCU-CHEK GUIDE) test strip, Check blood sugars once daily, Disp: 100 each, Rfl: 12   lisdexamfetamine (VYVANSE) 50 MG capsule, Take 1 capsule (50 mg total) by mouth daily., Disp: 30 capsule, Rfl: 0   losartan (COZAAR) 100 MG tablet, Take 1 tablet (100 mg total) by mouth daily., Disp: 90 tablet, Rfl: 0   metFORMIN (GLUCOPHAGE) 850 MG tablet, Take 1 tablet (850 mg total) by mouth with breakfast, with lunch, and with evening meal., Disp: 270 tablet, Rfl: 3   Multiple Vitamin (MULTIVITAMIN) tablet, Take 1 tablet by mouth daily. , Disp: , Rfl:    nitroGLYCERIN (NITRODUR - DOSED IN MG/24 HR) 0.2 mg/hr patch, Place 1 patch (0.2 mg total) onto the skin daily. Place 1/4 of the patch over the affected area., Disp: 30 patch, Rfl: 1   Probiotic Product (PROBIOTIC PO), Take 1 tablet by mouth daily. , Disp: , Rfl:    psyllium (METAMUCIL) 58.6 % powder, Take 1 packet by mouth daily as needed (constipation)., Disp: , Rfl:    tirzepatide (MOUNJARO) 7.5 MG/0.5ML Pen, Inject 7.5 mg into the skin once a week., Disp: 6 mL, Rfl: 3   VYVANSE 50 MG capsule, Take 50 mg by mouth daily., Disp: , Rfl:   Observations/Objective: Patient is well-developed, well-nourished in no acute distress.  Resting comfortably  at home.  Head is normocephalic, atraumatic.  No labored breathing.  Speech is clear and coherent with logical content.  Patient is alert and oriented at baseline.    Assessment and Plan: 1. Acute left-sided low back pain with left-sided sciatica - predniSONE (DELTASONE) 20 MG tablet; Take 2 tablets (40 mg total) by mouth daily with breakfast.  Dispense: 10 tablet; Refill: 0 - cyclobenzaprine (FLEXERIL) 10 MG tablet; Take 1 tablet (10 mg total) by mouth 3 (three) times daily as needed for muscle spasms.  Dispense: 30 tablet; Refill: 0  May alternate with heat and ice  application for pain relief. May also alternate with acetaminophen and Ibuprofen as prescribed for back pain. Other alternatives include massage, acupuncture and water aerobics.   Follow Up Instructions: I discussed the assessment and treatment plan with the patient. The patient was provided an opportunity to ask questions and all were answered. The patient agreed with the plan and demonstrated an understanding of the instructions.  A copy of instructions were sent to the patient via MyChart unless otherwise noted below.    The patient was advised to call back or seek an in-person evaluation if the symptoms worsen or if the condition fails to improve as anticipated.  Time:  I spent 12 minutes with the patient via telehealth technology discussing the above problems/concerns.    Claiborne Rigg, NP

## 2022-11-24 NOTE — Patient Instructions (Signed)
Spencer Good, thank you for joining Claiborne Rigg, NP for today's virtual visit.  While this provider is not your primary care provider (PCP), if your PCP is located in our provider database this encounter information will be shared with them immediately following your visit.   A [facility] MyChart account gives you access to today's visit and all your visits, tests, and labs performed at [facility] " click here if you don't have a [facility] MyChart account or go to [email or web address]  Consent: (Patient) Spencer Good provided verbal consent for this virtual visit at the beginning of the encounter.  Current Medications:  Current Outpatient Medications:    cyclobenzaprine (FLEXERIL) 10 MG tablet, Take 1 tablet (10 mg total) by mouth 3 (three) times daily as needed for muscle spasms., Disp: 30 tablet, Rfl: 0   predniSONE (DELTASONE) 20 MG tablet, Take 2 tablets (40 mg total) by mouth daily with breakfast., Disp: 10 tablet, Rfl: 0   atorvastatin (LIPITOR) 20 MG tablet, Take 1 tablet (20 mg total) by mouth daily., Disp: 90 tablet, Rfl: 0   empagliflozin (JARDIANCE) 25 MG TABS tablet, Take 1 tablet (25 mg total) by mouth daily., Disp: 90 tablet, Rfl: 3   fenofibrate 160 MG tablet, TAKE 1 TABLET(160 MG) BY MOUTH DAILY, Disp: 90 tablet, Rfl: 1   glimepiride (AMARYL) 1 MG tablet, Take 1 tablet (1 mg total) by mouth daily with breakfast., Disp: 90 tablet, Rfl: 2   glucose blood (ACCU-CHEK GUIDE) test strip, Check blood sugars once daily, Disp: 100 each, Rfl: 12   lisdexamfetamine (VYVANSE) 50 MG capsule, Take 1 capsule (50 mg total) by mouth daily., Disp: 30 capsule, Rfl: 0   losartan (COZAAR) 100 MG tablet, Take 1 tablet (100 mg total) by mouth daily., Disp: 90 tablet, Rfl: 0   metFORMIN (GLUCOPHAGE) 850 MG tablet, Take 1 tablet (850 mg total) by mouth with breakfast, with lunch, and with evening meal., Disp: 270 tablet, Rfl: 3   Multiple Vitamin (MULTIVITAMIN) tablet,  Take 1 tablet by mouth daily. , Disp: , Rfl:    nitroGLYCERIN (NITRODUR - DOSED IN MG/24 HR) 0.2 mg/hr patch, Place 1 patch (0.2 mg total) onto the skin daily. Place 1/4 of the patch over the affected area., Disp: 30 patch, Rfl: 1   Probiotic Product (PROBIOTIC PO), Take 1 tablet by mouth daily. , Disp: , Rfl:    psyllium (METAMUCIL) 58.6 % powder, Take 1 packet by mouth daily as needed (constipation)., Disp: , Rfl:    tirzepatide (MOUNJARO) 7.5 MG/0.5ML Pen, Inject 7.5 mg into the skin once a week., Disp: 6 mL, Rfl: 3   VYVANSE 50 MG capsule, Take 50 mg by mouth daily., Disp: , Rfl:    Medications ordered in this encounter:  Meds ordered this encounter  Medications   predniSONE (DELTASONE) 20 MG tablet    Sig: Take 2 tablets (40 mg total) by mouth daily with breakfast.    Dispense:  10 tablet    Refill:  0    Order Specific Question:   Supervising Provider    Answer:   Merrilee Jansky [[id number]]   cyclobenzaprine (FLEXERIL) 10 MG tablet    Sig: Take 1 tablet (10 mg total) by mouth 3 (three) times daily as needed for muscle spasms.    Dispense:  30 tablet    Refill:  0    Order Specific Question:   Supervising Provider    Answer:   Merrilee Jansky [id number]     *  If you need refills on other medications prior to your next appointment, please contact your pharmacy*  Follow-Up: Call back or seek an in-person evaluation if the symptoms worsen or if the condition fails to improve as anticipated.  [facility] [phone]  Other Instructions May alternate with heat and ice application for pain relief. May also alternate with acetaminophen and Ibuprofen as prescribed for back pain. Other alternatives include massage, acupuncture and water aerobics.    If you have been instructed to have an in-person evaluation today at a local Urgent Care facility, please use the link below. It will take you to a list of all of our available [facility], including address,  phone number and hours of operation. Please do not delay care.  [facility]  If you or a family member do not have a primary care provider, use the link below to schedule a visit and establish care. When you choose a [facility] primary care physician or advanced practice provider, you gain a long-term partner in health. Find a Primary Care Provider  Learn more about [facility]'s in-office and virtual care options: [facility] - Get Care Now

## 2022-12-03 ENCOUNTER — Other Ambulatory Visit (HOSPITAL_COMMUNITY): Payer: Self-pay

## 2022-12-03 ENCOUNTER — Encounter: Payer: Self-pay | Admitting: Internal Medicine

## 2022-12-03 ENCOUNTER — Other Ambulatory Visit: Payer: Self-pay

## 2022-12-03 ENCOUNTER — Ambulatory Visit (INDEPENDENT_AMBULATORY_CARE_PROVIDER_SITE_OTHER): Payer: BC Managed Care – PPO | Admitting: Internal Medicine

## 2022-12-03 VITALS — BP 132/84 | HR 109 | Temp 98.4°F | Resp 16 | Ht 72.0 in | Wt 325.1 lb

## 2022-12-03 DIAGNOSIS — Z0001 Encounter for general adult medical examination with abnormal findings: Secondary | ICD-10-CM | POA: Diagnosis not present

## 2022-12-03 DIAGNOSIS — E1165 Type 2 diabetes mellitus with hyperglycemia: Secondary | ICD-10-CM

## 2022-12-03 DIAGNOSIS — E1159 Type 2 diabetes mellitus with other circulatory complications: Secondary | ICD-10-CM

## 2022-12-03 DIAGNOSIS — E785 Hyperlipidemia, unspecified: Secondary | ICD-10-CM | POA: Diagnosis not present

## 2022-12-03 DIAGNOSIS — I152 Hypertension secondary to endocrine disorders: Secondary | ICD-10-CM

## 2022-12-03 DIAGNOSIS — M545 Low back pain, unspecified: Secondary | ICD-10-CM

## 2022-12-03 DIAGNOSIS — Z0000 Encounter for general adult medical examination without abnormal findings: Secondary | ICD-10-CM

## 2022-12-03 LAB — COMPREHENSIVE METABOLIC PANEL
ALT: 32 U/L (ref 0–53)
AST: 17 U/L (ref 0–37)
Albumin: 4.8 g/dL (ref 3.5–5.2)
Alkaline Phosphatase: 60 U/L (ref 39–117)
BUN: 24 mg/dL — ABNORMAL HIGH (ref 6–23)
CO2: 25 mEq/L (ref 19–32)
Calcium: 10.9 mg/dL — ABNORMAL HIGH (ref 8.4–10.5)
Chloride: 103 mEq/L (ref 96–112)
Creatinine, Ser: 1.19 mg/dL (ref 0.40–1.50)
GFR: 74.13 mL/min (ref >60.00)
Glucose, Bld: 130 mg/dL — ABNORMAL HIGH (ref 70–99)
Potassium: 4.9 mEq/L (ref 3.5–5.1)
Sodium: 139 mEq/L (ref 135–145)
Total Bilirubin: 0.4 mg/dL (ref 0.2–1.2)
Total Protein: 7.8 g/dL (ref 6.0–8.3)

## 2022-12-03 LAB — LIPID PANEL
Cholesterol: 170 mg/dL (ref 0–200)
HDL: 30.9 mg/dL — ABNORMAL LOW (ref >39.00)
NonHDL: 138.97
Total CHOL/HDL Ratio: 5
Triglycerides: 313 mg/dL — ABNORMAL HIGH (ref 0.0–149.0)
VLDL: 62.6 mg/dL — ABNORMAL HIGH (ref 0.0–40.0)

## 2022-12-03 LAB — CBC
HCT: 46.9 % (ref 39.0–52.0)
Hemoglobin: 15.5 g/dL (ref 13.0–17.0)
MCHC: 33 g/dL (ref 30.0–36.0)
MCV: 84.2 fl (ref 78.0–100.0)
Platelets: 270 10*3/uL (ref 150.0–400.0)
RBC: 5.58 Mil/uL (ref 4.22–5.81)
RDW: 14.8 % (ref 11.5–15.5)
WBC: 10.2 10*3/uL (ref 4.0–10.5)

## 2022-12-03 LAB — MICROALBUMIN / CREATININE URINE RATIO
Creatinine,U: 46 mg/dL
Microalb Creat Ratio: 1.5 mg/g (ref 0.0–30.0)
Microalb, Ur: <0.7 mg/dL (ref 0.0–1.9)

## 2022-12-03 LAB — LDL CHOLESTEROL, DIRECT: Direct LDL: 99 mg/dL

## 2022-12-03 NOTE — Patient Instructions (Addendum)
Vaccines I recommend: Covid booster Flu shot this fall  Check the  blood pressure regularly BP GOAL is between 110/65 and  135/85. If it is consistently higher or lower, let me know   Diabetes: You can check your sugars at different times, they right times to do it are: - early in AM fasting  ( blood sugar goal 70-130) - 2 hours after a meal (blood sugar goal less than 180)    GO TO THE LAB : Get the blood work     GO TO THE FRONT DESK, PLEASE SCHEDULE YOUR APPOINTMENTS Come back for   a checkup in 6 months

## 2022-12-03 NOTE — Progress Notes (Unsigned)
Subjective:    Patient ID: Spencer Good, male    DOB: 1976-04-05, 47 y.o.   MRN: [id number]  DOS:  12/03/2022 Type of visit - description: CPX  Here for CPX In general feels well. About 2 weeks ago he helped somebody moving a piece of furniture and injured his back, he called a "telemetry doc" and got some prednisone. Since then, pain is better.  Denies any bladder or bowel incontinence Has questionable radiation to the left buttock.  Wt Readings from Last 3 Encounters:  12/03/22 (!) 325 lb 2 oz (147.5 kg)  07/17/22 (!) 344 lb 9.6 oz (156.3 kg)  05/02/22 (!) 344 lb (156 kg)     Review of Systems See above   Past Medical History:  Diagnosis Date   ADHD (attention deficit hyperactivity disorder)    Diabetes (HCC)    Diverticulitis of colon with perforation    s/p sigmoid colectomy   Gout    Hernia, umbilical 2016   Laparoscopic umbilical hernia repair with mesh- s/p repair-resolved.   History of colon polyps    Hyperlipidemia    Hypertension    Insomnia    Obesity    OSA (obstructive sleep apnea) dx 05-2013   Rx a Cpap 3-15, can't use - no longer has machine.   Sleep apnea    doesn't use CPAP    Past Surgical History:  Procedure Laterality Date   COLONOSCOPY     EYE SURGERY     INSERTION OF MESH N/A 02/01/2015   Procedure: INSERTION OF MESH;  Surgeon: Emelia Loron, MD;  Location: [facility];  Service: General;  Laterality: N/A;   LAPAROSCOPIC SIGMOID COLECTOMY N/A 05/24/2016   Procedure: LAPAROSCOPIC SIGMOID COLECTOMY;  Surgeon: Glenna Fellows, MD;  Location: [facility];  Service: General;  Laterality: N/A;   LASIK Bilateral    RADIOLOGY WITH ANESTHESIA N/A 09/20/2021   Procedure: MRI WITH CERVICAL SPINE WITHOUT CONTRAST;  Surgeon: Radiologist, Medication, MD;  Location: [facility];  Service: Radiology;  Laterality: N/A;   SHOULDER ARTHROSCOPY Left 04/24/2020   Procedure: LEFT SHOULDER MANIPULATION UNDER ANESTHESIA, ROTATOR INTERVAL RELEASE, DEBRIDEMENT, BICEPS  TENODESIS;  Surgeon: Cammy Copa, MD;  Location: [facility];  Service: Orthopedics;  Laterality: Left;   UMBILICAL HERNIA REPAIR N/A 02/01/2015   Procedure: LAPAROSCOPIC UMBILICAL HERNIA REPAIR WITH MESH;  Surgeon: Emelia Loron, MD;  Location: [facility];  Service: General;  Laterality: N/A;    Current Outpatient Medications  Medication Instructions   atorvastatin (LIPITOR) 20 mg, Oral, Daily   cyclobenzaprine (FLEXERIL) 10 mg, Oral, 3 times daily PRN   empagliflozin (JARDIANCE) 25 mg, Oral, Daily   fenofibrate 160 MG tablet TAKE 1 TABLET(160 MG) BY MOUTH DAILY   glimepiride (AMARYL) 1 mg, Oral, Daily with breakfast   glucose blood (ACCU-CHEK GUIDE) test strip Check blood sugars once daily   lisdexamfetamine (VYVANSE) 50 mg, Oral, Daily   losartan (COZAAR) 100 mg, Oral, Daily   metFORMIN (GLUCOPHAGE) 850 mg, Oral, 3 times daily with meals   Mounjaro 7.5 mg, Subcutaneous, Weekly   Multiple Vitamin (MULTIVITAMIN) tablet 1 tablet, Oral, Daily   nitroGLYCERIN (NITRODUR - DOSED IN MG/24 HR) 0.2 mg, Transdermal, Daily, Place 1/4 of the patch over the affected area.   predniSONE (DELTASONE) 40 mg, Oral, Daily with breakfast   Probiotic Product (PROBIOTIC PO) 1 tablet, Oral, Daily   psyllium (METAMUCIL) 58.6 % powder 1 packet, Oral, Daily PRN   Vyvanse 50 mg, Oral, Daily       Objective:  Physical Exam BP 132/84   Pulse (!) 109   Temp 98.4 F (36.9 C) (Oral)   Resp 16   Ht 6' (1.829 m)   Wt (!) 325 lb 2 oz (147.5 kg)   SpO2 98%   BMI 44.09 kg/m  General: Well developed, NAD, BMI noted Neck: No  thyromegaly  HEENT:  Normocephalic . Face symmetric, atraumatic Lungs:  CTA B Normal respiratory effort, no intercostal retractions, no accessory muscle use. Heart: RRR,  no murmur.  Abdomen:  Not distended, soft, non-tender. No rebound or rigidity.   Lower extremities: no pretibial edema bilaterally  Skin: Exposed areas without rash. Not pale. Not jaundice Neurologic:  alert &  oriented X3.  Speech normal, gait appropriate for age and unassisted Strength symmetric and appropriate for age.  DTR symmetric.  Straight leg test negative. Psych: Cognition and judgment appear intact.  Cooperative with normal attention span and concentration.  Behavior appropriate. No anxious or depressed appearing.     Assessment    Assessment DM- per endo -- dx 2014, took invokana, metformin, dc ~06-2014 as diet improved, a1c 13 ( 08-2015), meds restarted HTN Dyslipidemia Gout Morbid obesity OSA - not using cpap as off 06-2021, intolerant. ADHD- per other provider  Insomnia H/o diverticulitis, sigmoid,w/ perf, conservative Rx 10-2014 , Admitted again 08/2015, abscess, s/p drainage. Tobacco- wellbutrin intolerant   PLAN: Here for CPX - Td 06-2020 -PNM 23: 2016 - PNM 13 2017 -Vaccines I recommend: COVID booster, flu shot this fall.  CCS- cscope 12/2014, polyp, 5 years, C-scope 04/07/2020, no polyps, 10 years, see report -Labs: CMP FLP CBC micro -Diet and exercise: discussed  -ACP in chart   DM: Saw Endo 06-2022, A1c 7.9, trending up however at this point he is on Mounjaro (Also on Jardiance, metformin), + weight loss noted, eating smaller portions and healthier. Praised  HTN: On losartan, BP okay, recommend ambulatory BPs, goals provided.  Labs. Dyslipidemia: On atorvastatin, checking labs. OSA: CPAP intolerant, reports that in the last few weeks is less sleepy (possibly from weight loss). Back pain: As described above, no red flags, recommend conservative treatment with some rest, judicious use of ibuprofen with GI precautions and Tylenol.  Call or come back if no better RTC 6 months   10=== DM: Saw Endo 02/25/2022, A1c was 7.4.  Was rec to switch from Rybelsus to [facility] but is still on Rybelsus due to insurance issue.  Also on metformin and Jardiance. Advised to do his best with a healthier diet and routine exercise, 2 of the  medication he takes will enhance his efforts  to lose weight. Morbid obesity: See above HTN: BP is very good, he is slightly tachycardic, recommend to monitor vital signs, continue losartan, check BMP. Dyslipidemia:Last FLP satisfactory, continue Lipitor and fenofibrate. Vaccine advice: Flu shot today, recommend to proceed with the COVID-vaccine. RTC CPX 4 months

## 2022-12-04 ENCOUNTER — Encounter: Payer: Self-pay | Admitting: Internal Medicine

## 2022-12-04 DIAGNOSIS — E1159 Type 2 diabetes mellitus with other circulatory complications: Secondary | ICD-10-CM | POA: Insufficient documentation

## 2022-12-04 NOTE — Assessment & Plan Note (Signed)
-   Td 06-2020 -PNM 23: 2016 - PNM 13 2017 -Vaccines I recommend: COVID booster, flu shot this fall.  CCS- cscope 12/2014, polyp, 5 years, C-scope 04/07/2020, no polyps, 10 years, see report -Labs: CMP FLP CBC micro -Diet and exercise: discussed  -ACP in chart

## 2022-12-05 ENCOUNTER — Other Ambulatory Visit (HOSPITAL_COMMUNITY): Payer: Self-pay

## 2022-12-05 MED ORDER — LISDEXAMFETAMINE DIMESYLATE 50 MG PO CAPS
50.0000 mg | ORAL_CAPSULE | Freq: Every day | ORAL | 0 refills | Status: DC
  Filled 2022-12-05: qty 30, 30d supply, fill #0

## 2022-12-09 ENCOUNTER — Other Ambulatory Visit (HOSPITAL_COMMUNITY): Payer: Self-pay

## 2023-01-10 ENCOUNTER — Other Ambulatory Visit (HOSPITAL_BASED_OUTPATIENT_CLINIC_OR_DEPARTMENT_OTHER): Payer: Self-pay

## 2023-01-10 MED ORDER — LISDEXAMFETAMINE DIMESYLATE 50 MG PO CAPS
50.0000 mg | ORAL_CAPSULE | Freq: Every day | ORAL | 0 refills | Status: DC
  Filled 2023-01-10: qty 30, 30d supply, fill #0

## 2023-01-11 ENCOUNTER — Other Ambulatory Visit (HOSPITAL_BASED_OUTPATIENT_CLINIC_OR_DEPARTMENT_OTHER): Payer: Self-pay

## 2023-01-13 ENCOUNTER — Other Ambulatory Visit: Payer: Self-pay

## 2023-01-15 NOTE — Progress Notes (Unsigned)
Name: Spencer Good  MRN/ DOB: [id number], 02/23/1976   Age/ Sex: 47 y.o., male    PCP: Wanda Plump, MD   Reason for Endocrinology Evaluation: Type 2 Diabetes Mellitus     Date of Initial Endocrinology Visit: 02/25/2022    PATIENT IDENTIFIER: Spencer Good is a 47 y.o. male with a past medical history of T2DM, OSA and Dyslipidemia . The patient presented for initial endocrinology clinic visit on 07/17/2022 for consultative assistance with his diabetes management.    HPI: Mr. Werling was    Diagnosed with DM 2014 Prior Medications tried/Intolerance: Ozempic - didn't remember            Hemoglobin A1c has ranged from 7.2% in 2022, peaking at 13.9% in 2017. Patient has required hospitalization within the last 1 year from hyper or hypoglycemia: no  In terms of diet, the patient eats 2 meals a day, snacks occasionally  Avoids sugar-sweetened beverages    Data analyst  On his initial visit to our clinic his A1c was 7.4%, he was on metformin, Jardiance, and Rybelsus.  We switched Rybelsus to Mounjaro   SUBJECTIVE:   During the last visit (07/17/2022): A1c 7.4%    Today (01/15/23): Ms. Marcus is here for follow-up on diabetes management.  He  checks his blood sugars occasionally. The patient has not had hypoglycemic episodes since the last clinic visit  Patient has been noted with weight loss He has OSA and does not use CPAP , sleeps long hours on weekend   Denies nausea, vomiting  Has occasional constipation with mounjaro     HOME DIABETES REGIMEN: Metformin 850  mg 2 tabs Qam and 1 tab QPM Jardiance 25 mg daily Mounjaro 7.5 mg weekly      Statin: yes ACE-I/ARB: yes   METER DOWNLOAD SUMMARY: Did not  bring      DIABETIC COMPLICATIONS: Microvascular complications:   Denies: CKD, retinopathy  Last eye exam: Completed 2023  Macrovascular complications:   Denies: CAD, PVD, CVA   PAST HISTORY: Past Medical History:  Past Medical History:   Diagnosis Date   ADHD (attention deficit hyperactivity disorder)    Diabetes (HCC)    Diverticulitis of colon with perforation    s/p sigmoid colectomy   Gout    Hernia, umbilical 2016   Laparoscopic umbilical hernia repair with mesh- s/p repair-resolved.   History of colon polyps    Hyperlipidemia    Hypertension    Insomnia    Obesity    OSA (obstructive sleep apnea) dx 05-2013   Rx a Cpap 3-15, can't use - no longer has machine.   Sleep apnea    doesn't use CPAP   Past Surgical History:  Past Surgical History:  Procedure Laterality Date   COLONOSCOPY     EYE SURGERY     INSERTION OF MESH N/A 02/01/2015   Procedure: INSERTION OF MESH;  Surgeon: Emelia Loron, MD;  [facility];  Service: General;  Laterality: N/A;   LAPAROSCOPIC SIGMOID COLECTOMY N/A 05/24/2016   Procedure: LAPAROSCOPIC SIGMOID COLECTOMY;  Surgeon: Glenna Fellows, MD;  Location: [facility];  Service: General;  Laterality: N/A;   LASIK Bilateral    RADIOLOGY WITH ANESTHESIA N/A 09/20/2021   Procedure: MRI WITH CERVICAL SPINE WITHOUT CONTRAST;  Surgeon: Radiologist, Medication, MD;  Location: [facility];  Service: Radiology;  Laterality: N/A;   SHOULDER ARTHROSCOPY Left 04/24/2020   Procedure: LEFT SHOULDER MANIPULATION UNDER ANESTHESIA, ROTATOR INTERVAL RELEASE, DEBRIDEMENT, BICEPS TENODESIS;  Surgeon: Cammy Copa,  MD;  Location[facility];  Service: Orthopedics;  Laterality: Left;   UMBILICAL HERNIA REPAIR N/A 02/01/2015   Procedure: LAPAROSCOPIC UMBILICAL HERNIA REPAIR WITH MESH;  Surgeon: Emelia Loron, MD;  Location: [facility];  Service: General;  Laterality: N/A;    Social History:  reports that he quit smoking about 8 years ago. His smoking use included cigarettes. He started smoking about 30 years ago. He has a 22.4 pack-year smoking history. He has never used smokeless tobacco. He reports current alcohol use. He reports that he does not use drugs. Family History:  Family History  Problem Relation Age  of Onset   Stroke Father        F, PGF   CAD Other        MGF   Heart disease Maternal Grandfather    Colon cancer Neg Hx    Prostate cancer Neg Hx    Diabetes Neg Hx        distant fam members   Colon polyps Neg Hx    Esophageal cancer Neg Hx    Gallbladder disease Neg Hx    Kidney disease Neg Hx    Rectal cancer Neg Hx    Stomach cancer Neg Hx      HOME MEDICATIONS: Allergies as of 01/16/2023   No Known Allergies      Medication List        Accurate as of January 15, 2023  2:55 PM. If you have any questions, ask your nurse or doctor.          Accu-Chek Guide test strip Generic drug: glucose blood Check blood sugars once daily   atorvastatin 20 MG tablet Commonly known as: LIPITOR Take 1 tablet (20 mg total) by mouth daily.   cyclobenzaprine 10 MG tablet Commonly known as: FLEXERIL Take 1 tablet (10 mg total) by mouth 3 (three) times daily as needed for muscle spasms.   empagliflozin 25 MG Tabs tablet Commonly known as: Jardiance Take 1 tablet (25 mg total) by mouth daily.   fenofibrate 160 MG tablet TAKE 1 TABLET(160 MG) BY MOUTH DAILY   losartan 100 MG tablet Commonly known as: COZAAR Take 1 tablet (100 mg total) by mouth daily.   metFORMIN 850 MG tablet Commonly known as: GLUCOPHAGE Take 1 tablet (850 mg total) by mouth with breakfast, with lunch, and with evening meal.   Mounjaro 7.5 MG/0.5ML Pen Generic drug: tirzepatide Inject 7.5 mg into the skin once a week.   multivitamin tablet Take 1 tablet by mouth daily.   PROBIOTIC PO Take 1 tablet by mouth daily.   psyllium 58.6 % powder Commonly known as: METAMUCIL Take 1 packet by mouth daily as needed (constipation).   Vyvanse 50 MG capsule Generic drug: lisdexamfetamine Take 50 mg by mouth daily.   lisdexamfetamine 50 MG capsule Commonly known as: VYVANSE Take 1 capsule (50 mg total) by mouth daily.         ALLERGIES: No Known Allergies   REVIEW OF SYSTEMS: A comprehensive  ROS was conducted with the patient and is negative except as per HPI     OBJECTIVE:   VITAL SIGNS: There were no vitals taken for this visit.   PHYSICAL EXAM:  General: Pt appears well and is in NAD  Lungs: Clear with Good BS bilat with no rales, rhonchi, or wheezes  Heart: RRR   Abdomen:  soft, nontender  Extremities:  Lower extremities - No pretibial edema.   Neuro: MS is Good with appropriate affect, pt is  alert and Ox3    DM foot exam: 01/16/2023  The skin of the feet is without sores or ulcerations, planat callus formation noted The pedal pulses are 2+ on right and 2+ on left. The sensation is intact to a screening 5.07, 10 gram monofilament bilaterally   DATA REVIEWED:  Lab Results  Component Value Date   HGBA1C 7.9 (A) 07/17/2022   HGBA1C 7.4 (A) 02/25/2022   HGBA1C 7.4 (H) 10/29/2021         Latest Reference Range & Units 12/03/22 10:37  COMPREHENSIVE METABOLIC PANEL  Rpt !  Sodium 135 - 145 mEq/L 139  Potassium 3.5 - 5.1 mEq/L 4.9  Chloride 96 - 112 mEq/L 103  CO2 19 - 32 mEq/L 25  Glucose 70 - 99 mg/dL 409 (H)  BUN 6 - 23 mg/dL 24 (H)  Creatinine 8.11 - 1.50 mg/dL 9.14  Calcium 8.4 - 78.2 mg/dL 95.6 (H)  Alkaline Phosphatase 39 - 117 U/L 60  Albumin 3.5 - 5.2 g/dL 4.8  AST 0 - 37 U/L 17  ALT 0 - 53 U/L 32  Total Protein 6.0 - 8.3 g/dL 7.8  Total Bilirubin 0.2 - 1.2 mg/dL 0.4  GFR >21.30 mL/min 74.13  Total CHOL/HDL Ratio  5  Cholesterol 0 - 200 mg/dL 865  HDL Cholesterol >78.46 mg/dL 96.29 (L)  Direct LDL mg/dL 52.8  MICROALB/CREAT RATIO 0.0 - 30.0 mg/g 1.5  NonHDL  138.97  Triglycerides 0.0 - 149.0 mg/dL 413.2 (H)  VLDL 0.0 - 44.0 mg/dL 10.2 (H)  !: Data is abnormal (H): Data is abnormally high (L): Data is abnormally low Rpt: View report in Results Review for more information  ASSESSMENT / PLAN / RECOMMENDATIONS:   1) Type 2 Diabetes Mellitus, Optimally controlled, Without complications - Most recent A1c of 6.5 %. Goal A1c < 7.0 %.    - A1c  has trended down to goal -Patient continues to lose weight, he is tolerating Mounjaro -No changes at this time  MEDICATIONS:  Continue metformin 850 mg 3 times daily daily Continue Jardiance 25 mg daily Continue Mounjaro 7.5 mg weekly   EDUCATION / INSTRUCTIONS: BG monitoring instructions: Patient is instructed to check his blood sugars 2-3 times a week. Call [facility] if: BG persistently < 70  I reviewed the Rule of 15 for the treatment of hypoglycemia in detail with the patient. Literature supplied.   2) Diabetic complications:  Eye: Does not have known diabetic retinopathy.  Neuro/ Feet: Does not have known diabetic peripheral neuropathy. Renal: Patient does not have known baseline CKD. He is  on an ACEI/ARB at present.  3) Dyslipidemia :   -I have reviewed his lipid panel that was PCPs office with elevated TG, prefer LDL to be lower than 99 mg/DL -He he is currently on atorvastatin and fenofibrate -Will continue to monitor and make adjustments if no improvement in the future    I spent 25 minutes preparing to see the patient by review of recent labs, imaging and procedures, obtaining and reviewing separately obtained history, communicating with the patient, ordering medications, tests or procedures, and documenting clinical information in the EHR including the differential Dx, treatment, and any further evaluation and other management    F/U in 6 months      Signed electronically by: Lyndle Herrlich, MD  [facility] Endocrinology  [facility] [address] [place], [place] [address] Phone: [phone] FAX: [phone]   CC: Wanda Plump, MD [address]  [address] [place] [address] Phone: [phone]  Fax: [phone]    Return to Endocrinology clinic as below: Future Appointments  Date Time Provider Department Center  01/16/2023  7:50 AM Linzy Laury, Konrad Dolores, MD [id number] None  06/03/2023   8:20 AM Wanda Plump, MD [id number]

## 2023-01-16 ENCOUNTER — Ambulatory Visit (INDEPENDENT_AMBULATORY_CARE_PROVIDER_SITE_OTHER): Payer: BC Managed Care – PPO | Admitting: Internal Medicine

## 2023-01-16 ENCOUNTER — Encounter: Payer: Self-pay | Admitting: Internal Medicine

## 2023-01-16 VITALS — BP 128/74 | HR 84 | Ht 72.0 in | Wt 326.0 lb

## 2023-01-16 DIAGNOSIS — Z7984 Long term (current) use of oral hypoglycemic drugs: Secondary | ICD-10-CM | POA: Diagnosis not present

## 2023-01-16 DIAGNOSIS — E785 Hyperlipidemia, unspecified: Secondary | ICD-10-CM | POA: Diagnosis not present

## 2023-01-16 DIAGNOSIS — E119 Type 2 diabetes mellitus without complications: Secondary | ICD-10-CM | POA: Diagnosis not present

## 2023-01-16 DIAGNOSIS — Z7985 Long-term (current) use of injectable non-insulin antidiabetic drugs: Secondary | ICD-10-CM

## 2023-01-16 LAB — POCT GLUCOSE (DEVICE FOR HOME USE): Glucose Fasting, POC: 161 mg/dL — AB (ref 70–99)

## 2023-01-16 LAB — POCT GLYCOSYLATED HEMOGLOBIN (HGB A1C): Hemoglobin A1C: 6.5 % — AB (ref 4.0–5.6)

## 2023-01-16 MED ORDER — METFORMIN HCL 850 MG PO TABS: 850.0000 mg | ORAL_TABLET | Freq: Three times a day (TID) | ORAL | 3 refills | Status: DC

## 2023-01-16 MED ORDER — EMPAGLIFLOZIN 25 MG PO TABS: 25.0000 mg | ORAL_TABLET | Freq: Every day | ORAL | 3 refills | Status: DC

## 2023-01-16 MED ORDER — TIRZEPATIDE 7.5 MG/0.5ML ~~LOC~~ SOAJ: 7.5000 mg | SUBCUTANEOUS | 3 refills | Status: DC

## 2023-01-16 NOTE — Patient Instructions (Addendum)
Continue Metformin 850 mg , three times daily  Continue Jardiance 25 mg daily  Continue Mounjaro 7.5 mg ONCE weekly       HOW TO TREAT LOW BLOOD SUGARS (Blood sugar LESS THAN 70 MG/DL) Please follow the RULE OF 15 for the treatment of hypoglycemia treatment (when your (blood sugars are less than 70 mg/dL)   STEP 1: Take 15 grams of carbohydrates when your blood sugar is low, which includes:  3-4 GLUCOSE TABS  OR 3-4 OZ OF JUICE OR REGULAR SODA OR ONE TUBE OF GLUCOSE GEL    STEP 2: RECHECK blood sugar in 15 MINUTES STEP 3: If your blood sugar is still low at the 15 minute recheck --> then, go back to STEP 1 and treat AGAIN with another 15 grams of carbohydrates.

## 2023-01-30 ENCOUNTER — Other Ambulatory Visit: Payer: Self-pay | Admitting: Internal Medicine

## 2023-02-12 ENCOUNTER — Other Ambulatory Visit (HOSPITAL_BASED_OUTPATIENT_CLINIC_OR_DEPARTMENT_OTHER): Payer: Self-pay

## 2023-02-15 ENCOUNTER — Other Ambulatory Visit (HOSPITAL_BASED_OUTPATIENT_CLINIC_OR_DEPARTMENT_OTHER): Payer: Self-pay

## 2023-02-15 MED ORDER — LISDEXAMFETAMINE DIMESYLATE 50 MG PO CAPS
50.0000 mg | ORAL_CAPSULE | Freq: Every day | ORAL | 0 refills | Status: DC
  Filled 2023-02-15: qty 30, 30d supply, fill #0

## 2023-02-18 ENCOUNTER — Other Ambulatory Visit (HOSPITAL_BASED_OUTPATIENT_CLINIC_OR_DEPARTMENT_OTHER): Payer: Self-pay

## 2023-03-10 ENCOUNTER — Other Ambulatory Visit: Payer: Self-pay | Admitting: Internal Medicine

## 2023-03-14 ENCOUNTER — Other Ambulatory Visit (HOSPITAL_BASED_OUTPATIENT_CLINIC_OR_DEPARTMENT_OTHER): Payer: Self-pay

## 2023-03-14 MED ORDER — LISDEXAMFETAMINE DIMESYLATE 50 MG PO CAPS
50.0000 mg | ORAL_CAPSULE | Freq: Every day | ORAL | 0 refills | Status: DC
  Filled 2023-03-14: qty 30, 30d supply, fill #0

## 2023-03-25 ENCOUNTER — Other Ambulatory Visit (HOSPITAL_COMMUNITY): Payer: Self-pay

## 2023-04-15 ENCOUNTER — Telehealth: Payer: BC Managed Care – PPO | Admitting: Nurse Practitioner

## 2023-04-15 DIAGNOSIS — J069 Acute upper respiratory infection, unspecified: Secondary | ICD-10-CM | POA: Diagnosis not present

## 2023-04-15 NOTE — Progress Notes (Signed)
Virtual Visit Consent   Spencer Good, you are scheduled for a virtual visit with a [facility] provider today. Just as with appointments in the office, your consent must be obtained to participate. Your consent will be active for this visit and any virtual visit you may have with one of our providers in the next 365 days. If you have a MyChart account, a copy of this consent can be sent to you electronically.  As this is a virtual visit, video technology does not allow for your provider to perform a traditional examination. This may limit your provider's ability to fully assess your condition. If your provider identifies any concerns that need to be evaluated in person or the need to arrange testing (such as labs, EKG, etc.), we will make arrangements to do so. Although advances in technology are sophisticated, we cannot ensure that it will always work on either your end or our end. If the connection with a video visit is poor, the visit may have to be switched to a telephone visit. With either a video or telephone visit, we are not always able to ensure that we have a secure connection.  By engaging in this virtual visit, you consent to the provision of healthcare and authorize for your insurance to be billed (if applicable) for the services provided during this visit. Depending on your insurance coverage, you may receive a charge related to this service.  I need to obtain your verbal consent now. Are you willing to proceed with your visit today? Spencer Good has provided verbal consent on 04/15/2023 for a virtual visit (video or telephone). Spencer Simas, FNP  Date: 04/15/2023 2:34 PM  Virtual Visit via Video Note   I, Spencer Good, connected with  Spencer Good  ([id number], 05-21-1976) on 04/15/23 at  2:45 PM EDT by a video-enabled telemedicine application and verified that I am speaking with the correct person using two identifiers.  Location: Patient: Virtual Visit Location Patient:  Home Provider: Virtual Visit Location Provider: Home Office   I discussed the limitations of evaluation and management by telemedicine and the availability of in person appointments. The patient expressed understanding and agreed to proceed.    History of Present Illness: Spencer Good is a 47 y.o. who identifies as a male who was assigned male at birth, and is being seen today for symptoms including   4 days of muscle aches, fatigue, chills and feeling hot without fever  Denies nasal congestion or cough   He did take a home COVID test that was negative  Has not had a flu shot yet this season   Denies any sick contacts   Denies diarrhea,  Has had decreased appetite slight nausea no vomiting   Denies any recent travel   He works from home  Has not had COVID in the past   He has DM, does monitor at home  Currently on Jardiance, Meformin and Mounjaro  Glucose during visit is 106 last meal was 4 hours ago- toast   History also include Sleep Apnea  Does not wear CPAP was not able to tolerated   ADD Takes Vyvanse daily, hasn't taken today but has the prior days     Problems:  Patient Active Problem List   Diagnosis Date Noted   Hypertension associated with diabetes (HCC) 12/04/2022   Obstructive sleep apnea 10/05/2019   Dyslipidemia 07/09/2019   Annual physical exam 02/02/2019   Diverticulitis of sigmoid colon 05/24/2016   Morbid obesity (HCC)  04/25/2016   ADD (attention deficit disorder) 12/28/2015   Diverticulitis of large intestine with abscess without bleeding 09/13/2015   PCP NOTES >>>>>>>>>>>>>>>>>>>> 09/07/2015   Elevated WBCs 01/27/2014   Diabetes (HCC) 02/17/2013   Tachycardia 08/29/2011   TOBACCO USER 06/29/2010    Allergies: No Known Allergies Medications:  Current Outpatient Medications:    atorvastatin (LIPITOR) 20 MG tablet, Take 1 tablet (20 mg total) by mouth daily., Disp: 90 tablet, Rfl: 1   losartan (COZAAR) 100 MG tablet, Take 1 tablet (100 mg  total) by mouth daily., Disp: 90 tablet, Rfl: 1   cyclobenzaprine (FLEXERIL) 10 MG tablet, Take 1 tablet (10 mg total) by mouth 3 (three) times daily as needed for muscle spasms., Disp: 30 tablet, Rfl: 0   fenofibrate 160 MG tablet, TAKE 1 TABLET(160 MG) BY MOUTH DAILY, Disp: 90 tablet, Rfl: 1   glucose blood (ACCU-CHEK GUIDE) test strip, Check blood sugars once daily, Disp: 100 each, Rfl: 12   JARDIANCE 25 MG TABS tablet, TAKE 1 TABLET(25 MG) BY MOUTH DAILY, Disp: 90 tablet, Rfl: 3   lisdexamfetamine (VYVANSE) 50 MG capsule, Take 1 capsule (50 mg total) by mouth daily., Disp: 30 capsule, Rfl: 0   metFORMIN (GLUCOPHAGE) 850 MG tablet, Take 1 tablet (850 mg total) by mouth with breakfast, with lunch, and with evening meal., Disp: 270 tablet, Rfl: 3   Multiple Vitamin (MULTIVITAMIN) tablet, Take 1 tablet by mouth daily. , Disp: , Rfl:    Probiotic Product (PROBIOTIC PO), Take 1 tablet by mouth daily. , Disp: , Rfl:    psyllium (METAMUCIL) 58.6 % powder, Take 1 packet by mouth daily as needed (constipation)., Disp: , Rfl:    tirzepatide (MOUNJARO) 7.5 MG/0.5ML Pen, Inject 7.5 mg into the skin once a week., Disp: 6 mL, Rfl: 3   VYVANSE 50 MG capsule, Take 50 mg by mouth daily., Disp: , Rfl:   Observations/Objective: Patient is well-developed, well-nourished in no acute distress.  Resting comfortably  at home.  Head is normocephalic, atraumatic.  No labored breathing.  Speech is clear and coherent with logical content.  Patient is alert and oriented at baseline.    Assessment and Plan:  1. Viral upper respiratory tract infection  Advised immune support with Vitamin C  Increase protein intake  Alternate tylenol and advil for body aches  Push fluids  Monitor glucose closely   Follow up with new/persistent symptoms as discussed       Follow Up Instructions: I discussed the assessment and treatment plan with the patient. The patient was provided an opportunity to ask questions and all  were answered. The patient agreed with the plan and demonstrated an understanding of the instructions.  A copy of instructions were sent to the patient via MyChart unless otherwise noted below.    The patient was advised to call back or seek an in-person evaluation if the symptoms worsen or if the condition fails to improve as anticipated.    Spencer Simas, FNP

## 2023-04-22 ENCOUNTER — Other Ambulatory Visit (HOSPITAL_BASED_OUTPATIENT_CLINIC_OR_DEPARTMENT_OTHER): Payer: Self-pay

## 2023-04-22 DIAGNOSIS — Z79899 Other long term (current) drug therapy: Secondary | ICD-10-CM | POA: Diagnosis not present

## 2023-04-22 DIAGNOSIS — F902 Attention-deficit hyperactivity disorder, combined type: Secondary | ICD-10-CM | POA: Diagnosis not present

## 2023-04-22 MED ORDER — LISDEXAMFETAMINE DIMESYLATE 50 MG PO CAPS
50.0000 mg | ORAL_CAPSULE | Freq: Every day | ORAL | 0 refills | Status: DC
Start: 2023-04-22 — End: 2023-06-04
  Filled 2023-04-22: qty 30, 30d supply, fill #0

## 2023-05-01 DIAGNOSIS — F902 Attention-deficit hyperactivity disorder, combined type: Secondary | ICD-10-CM | POA: Diagnosis not present

## 2023-05-14 ENCOUNTER — Other Ambulatory Visit: Payer: Self-pay | Admitting: Internal Medicine

## 2023-06-03 ENCOUNTER — Ambulatory Visit (INDEPENDENT_AMBULATORY_CARE_PROVIDER_SITE_OTHER): Payer: BC Managed Care – PPO | Admitting: Internal Medicine

## 2023-06-03 ENCOUNTER — Encounter: Payer: Self-pay | Admitting: Internal Medicine

## 2023-06-03 VITALS — BP 126/84 | HR 97 | Temp 97.6°F | Resp 18 | Ht 72.0 in | Wt 324.5 lb

## 2023-06-03 DIAGNOSIS — E1159 Type 2 diabetes mellitus with other circulatory complications: Secondary | ICD-10-CM

## 2023-06-03 DIAGNOSIS — E1165 Type 2 diabetes mellitus with hyperglycemia: Secondary | ICD-10-CM

## 2023-06-03 DIAGNOSIS — I152 Hypertension secondary to endocrine disorders: Secondary | ICD-10-CM

## 2023-06-03 DIAGNOSIS — Z23 Encounter for immunization: Secondary | ICD-10-CM

## 2023-06-03 DIAGNOSIS — E119 Type 2 diabetes mellitus without complications: Secondary | ICD-10-CM

## 2023-06-03 DIAGNOSIS — Z7984 Long term (current) use of oral hypoglycemic drugs: Secondary | ICD-10-CM

## 2023-06-03 LAB — BASIC METABOLIC PANEL
BUN: 14 mg/dL (ref 6–23)
CO2: 24 meq/L (ref 19–32)
Calcium: 9 mg/dL (ref 8.4–10.5)
Chloride: 105 meq/L (ref 96–112)
Creatinine, Ser: 0.96 mg/dL (ref 0.40–1.50)
GFR: 95.59 mL/min (ref >60.00)
Glucose, Bld: 166 mg/dL — ABNORMAL HIGH (ref 70–99)
Potassium: 4.6 meq/L (ref 3.5–5.1)
Sodium: 139 meq/L (ref 135–145)

## 2023-06-03 NOTE — Patient Instructions (Addendum)
Vaccines I recommend: Covid booster  Check the  blood pressure regularly Blood pressure goal:  between 110/65 and  135/85. If it is consistently higher or lower, let me know     GO TO THE LAB : Get the blood work     Next visit with me  in 6 months Please schedule it at the front desk    STOP BY THE FIRST FLOOR:  get the XR    If you have MyChart, please check frequently in the next few days for your results  Per our records you are due for your diabetic eye exam. Please contact your eye doctor to schedule an appointment. Please have them send copies of your office visit notes to [place]. Our fax number is [phone]. If you need a referral to an eye doctor please let us know.

## 2023-06-03 NOTE — Progress Notes (Signed)
Subjective:    Patient ID: Spencer Good, male    DOB: 12-30-1975, 47 y.o.   MRN: [id number]  DOS:  06/03/2023 Type of visit - description: f/u Since the last office visit is doing well. Chronic medical problems addressed.  Wt Readings from Last 3 Encounters:  06/03/23 (!) 324 lb 8 oz (147.2 kg)  01/16/23 (!) 326 lb (147.9 kg)  12/03/22 (!) 325 lb 2 oz (147.5 kg)    Review of Systems See above   Past Medical History:  Diagnosis Date   ADHD (attention deficit hyperactivity disorder)    Diabetes (HCC)    Diverticulitis of colon with perforation    s/p sigmoid colectomy   Gout    Hernia, umbilical 2016   Laparoscopic umbilical hernia repair with mesh- s/p repair-resolved.   History of colon polyps    Hyperlipidemia    Hypertension    Insomnia    Obesity    OSA (obstructive sleep apnea) dx 05-2013   Rx a Cpap 3-15, can't use - no longer has machine.   Sleep apnea    doesn't use CPAP    Past Surgical History:  Procedure Laterality Date   COLONOSCOPY     EYE SURGERY     INSERTION OF MESH N/A 02/01/2015   Procedure: INSERTION OF MESH;  Surgeon: Emelia Loron, MD;  [facility];  Service: General;  Laterality: N/A;   LAPAROSCOPIC SIGMOID COLECTOMY N/A 05/24/2016   Procedure: LAPAROSCOPIC SIGMOID COLECTOMY;  Surgeon: Glenna Fellows, MD;  Location: [facility];  Service: General;  Laterality: N/A;   LASIK Bilateral    RADIOLOGY WITH ANESTHESIA N/A 09/20/2021   Procedure: MRI WITH CERVICAL SPINE WITHOUT CONTRAST;  Surgeon: Radiologist, Medication, MD;  [facility]: [facility];  Service: Radiology;  Laterality: N/A;   SHOULDER ARTHROSCOPY Left 04/24/2020   Procedure: LEFT SHOULDER MANIPULATION UNDER ANESTHESIA, ROTATOR INTERVAL RELEASE, DEBRIDEMENT, BICEPS TENODESIS;  Surgeon: Cammy Copa, MD;  [facility];  Service: Orthopedics;  Laterality: Left;   UMBILICAL HERNIA REPAIR N/A 02/01/2015   Procedure: LAPAROSCOPIC UMBILICAL HERNIA REPAIR WITH MESH;  Surgeon: Emelia Loron, MD;  [facility];  Service: General;  Laterality: N/A;    Current Outpatient Medications  Medication Instructions   atorvastatin (LIPITOR) 20 mg, Oral, Daily   cyclobenzaprine (FLEXERIL) 10 mg, Oral, 3 times daily PRN   fenofibrate 160 mg, Oral, Daily   glucose blood (ACCU-CHEK GUIDE) test strip Check blood sugars once daily   JARDIANCE 25 MG TABS tablet TAKE 1 TABLET(25 MG) BY MOUTH DAILY   lisdexamfetamine (VYVANSE) 50 mg, Oral, Daily   losartan (COZAAR) 100 mg, Oral, Daily   metFORMIN (GLUCOPHAGE) 850 mg, Oral, 3 times daily with meals   Multiple Vitamin (MULTIVITAMIN) tablet 1 tablet, Daily   Probiotic Product (PROBIOTIC PO) 1 tablet, Daily   psyllium (METAMUCIL) 58.6 % powder 1 packet, Daily PRN   tirzepatide (MOUNJARO) 7.5 mg, Subcutaneous, Weekly       Objective:   Physical Exam BP 126/84   Pulse 97   Temp 97.6 F (36.4 C) (Oral)   Resp 18   Ht 6' (1.829 m)   Wt (!) 324 lb 8 oz (147.2 kg)   SpO2 97%   BMI 44.01 kg/m  General:   Well developed, NAD, BMI noted. HEENT:  Normocephalic . Face symmetric, atraumatic Lungs:  CTA B Normal respiratory effort, no intercostal retractions, no accessory muscle use. Heart: RRR,  no murmur.  Lower extremities: no pretibial edema bilaterally  Skin: Not pale. Not  jaundice Neurologic:  alert & oriented X3.  Speech normal, gait appropriate for age and unassisted Psych--  Cognition and judgment appear intact.  Cooperative with normal attention span and concentration.  Behavior appropriate. No anxious or depressed appearing.      Assessment     Assessment DM- per endo -- dx 2014, took invokana, metformin, dc ~06-2014 as diet improved, a1c 13 ( 08-2015), meds restarted HTN Dyslipidemia Gout Morbid obesity OSA - not using cpap as off 06-2021, intolerant. ADHD- per other provider  Insomnia H/o diverticulitis, sigmoid,w/ perf, conservative Rx 10-2014 , Admitted again 08/2015, abscess, s/p drainage. Tobacco-  wellbutrin intolerant   PLAN: DM: Per Endo, on Jardiance, metformin, Mounjaro, Morbid obesity: Has lost 20 pounds in the last year, Greggory Keen is helping decrease food portions, strongly encouraged to improve quality of food as well, increase fruits and vegetables,  which he is starting to do and increase physical activity. HTN: Seems well-controlled on losartan, check BMP. Preventive care flu shot today, plans to get a COVID-vaccine. RTC 6 months CPX

## 2023-06-03 NOTE — Assessment & Plan Note (Signed)
DM: Per Endo, on Jardiance, metformin, Mounjaro, Morbid obesity: Has lost 20 pounds in the last year, Greggory Keen is helping decrease food portions, strongly encouraged to improve quality of food as well, increase fruits and vegetables,  which he is starting to do and increase physical activity. HTN: Seems well-controlled on losartan, check BMP. Preventive care flu shot today, plans to get a COVID-vaccine. RTC 6 months CPX

## 2023-06-04 ENCOUNTER — Other Ambulatory Visit (HOSPITAL_BASED_OUTPATIENT_CLINIC_OR_DEPARTMENT_OTHER): Payer: Self-pay

## 2023-06-04 MED ORDER — LISDEXAMFETAMINE DIMESYLATE 50 MG PO CAPS
50.0000 mg | ORAL_CAPSULE | Freq: Every day | ORAL | 0 refills | Status: AC
Start: 2023-06-04 — End: ?
  Filled 2023-06-04: qty 90, 90d supply, fill #0

## 2023-07-21 NOTE — Progress Notes (Signed)
 Name: Spencer Good  MRN/ DOB: [id number], May 09, 1976   Age/ Sex: 48 y.o., male    PCP: Amon Aloysius BRAVO, MD   Reason for Endocrinology Evaluation: Type 2 Diabetes Mellitus     Date of Initial Endocrinology Visit: 02/25/2022    PATIENT IDENTIFIER: Spencer Good is a 48 y.o. male with a past medical history of T2DM, OSA and Dyslipidemia . The patient presented for initial endocrinology clinic visit on 07/17/2022 for consultative assistance with his diabetes management.    HPI: Spencer Good was    Diagnosed with DM 2014 Prior Medications tried/Intolerance: Ozempic  - didn't remember            Hemoglobin A1c has ranged from 7.2% in 2022, peaking at 13.9% in 2017. 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  We switched Rybelsus  to Mounjaro    SUBJECTIVE:   During the last visit (01/16/2023): A1c 6.5%    Today (07/22/23): Spencer Good is here for follow-up on diabetes management.  He  checks his blood sugars occasionally. The patient has not had hypoglycemic episodes since the last clinic visit  Patient has been noted with weight loss He has OSA and does not use CPAP , sleeps long hours on weekend   Denies nausea, vomiting  Continues with  occasional constipation, uses OTC stool softners     HOME DIABETES REGIMEN: Metformin  850  mg 2 tabs Qam and 1 tab QPM Jardiance  25 mg daily Mounjaro  7.5 mg weekly    Statin: yes ACE-I/ARB: yes   METER DOWNLOAD SUMMARY: unable to download 112- 154 mg/dL      DIABETIC COMPLICATIONS: Microvascular complications:   Denies: CKD, retinopathy  Last eye exam: Completed 2023  Macrovascular complications:   Denies: CAD, PVD, CVA   PAST HISTORY: Past Medical  History:  Past Medical History:  Diagnosis Date   ADHD (attention deficit hyperactivity disorder)    Diabetes (HCC)    Diverticulitis of colon with perforation    s/p sigmoid colectomy   Gout    Hernia, umbilical 2016   Laparoscopic umbilical hernia repair with mesh- s/p repair-resolved.   History of colon polyps    Hyperlipidemia    Hypertension    Insomnia    Obesity    OSA (obstructive sleep apnea) dx 05-2013   Rx a Cpap 3-15, can't use - no longer has machine.   Sleep apnea    doesn't use CPAP   Past Surgical History:  Past Surgical History:  Procedure Laterality Date   COLONOSCOPY     EYE SURGERY     INSERTION OF MESH N/A 02/01/2015   Procedure: INSERTION OF MESH;  Surgeon: Donnice Bury, MD;  [facility];  Service: General;  Laterality: N/A;   LAPAROSCOPIC SIGMOID COLECTOMY N/A 05/24/2016   Procedure: LAPAROSCOPIC SIGMOID COLECTOMY;  Surgeon: Morene Olives, MD;  Location: [facility];  Service: General;  Laterality: N/A;   LASIK Bilateral    RADIOLOGY WITH ANESTHESIA N/A 09/20/2021   Procedure: MRI WITH CERVICAL SPINE WITHOUT CONTRAST;  Surgeon: Radiologist, Medication, MD;  [facility];  Service: Radiology;  Laterality: N/A;   SHOULDER ARTHROSCOPY Left 04/24/2020   Procedure: LEFT SHOULDER MANIPULATION UNDER ANESTHESIA, ROTATOR INTERVAL RELEASE, DEBRIDEMENT, BICEPS TENODESIS;  Surgeon: Addie Cordella Hamilton, MD;  Location: [facility] OR;  Service: Orthopedics;  Laterality: Left;   UMBILICAL HERNIA REPAIR N/A 02/01/2015   Procedure: LAPAROSCOPIC UMBILICAL HERNIA REPAIR WITH MESH;  Surgeon: Donnice Bury, MD;  [facility];  Service: General;  Laterality: N/A;    Social History:  reports that he quit smoking about 8 years ago. His smoking use included cigarettes. He started smoking about 30 years ago. He has a 22.4 pack-year smoking history. He has never used smokeless tobacco. He reports current alcohol use. He reports that he does not use drugs. Family History:  Family  History  Problem Relation Age of Onset   Stroke Father        F, PGF   CAD Other        MGF   Heart disease Maternal Grandfather    Colon cancer Neg Hx    Prostate cancer Neg Hx    Diabetes Neg Hx        distant fam members   Colon polyps Neg Hx    Esophageal cancer Neg Hx    Gallbladder disease Neg Hx    Kidney disease Neg Hx    Rectal cancer Neg Hx    Stomach cancer Neg Hx      HOME MEDICATIONS: Allergies as of 07/22/2023   No Known Allergies      Medication List        Accurate as of July 22, 2023  7:58 AM. If you have any questions, ask your nurse or doctor.          Accu-Chek Guide test strip Generic drug: glucose blood Check blood sugars once daily   atorvastatin  20 MG tablet Commonly known as: LIPITOR Take 1 tablet (20 mg total) by mouth daily.   cyclobenzaprine  10 MG tablet Commonly known as: FLEXERIL  Take 1 tablet (10 mg total) by mouth 3 (three) times daily as needed for muscle spasms.   fenofibrate  160 MG tablet Take 1 tablet (160 mg total) by mouth daily.   Jardiance  25 MG Tabs tablet Generic drug: empagliflozin  TAKE 1 TABLET(25 MG) BY MOUTH DAILY   lisdexamfetamine 50 MG capsule Commonly known as: VYVANSE  Take 1 capsule (50 mg total) by mouth daily.   losartan  100 MG tablet Commonly known as: COZAAR  Take 1 tablet (100 mg total) by mouth daily.   metFORMIN  850 MG tablet Commonly known as: GLUCOPHAGE  Take 1 tablet (850 mg total) by mouth with breakfast, with lunch, and with evening meal.   multivitamin tablet Take 1 tablet by mouth daily.   PROBIOTIC PO Take 1 tablet by mouth daily.   psyllium 58.6 % powder Commonly known as: METAMUCIL Take 1 packet by mouth daily as needed (constipation).   tirzepatide  7.5 MG/0.5ML Pen Commonly known as: MOUNJARO  Inject 7.5 mg into the skin once a week.         ALLERGIES: No Known Allergies   REVIEW OF SYSTEMS: A comprehensive ROS was conducted with the patient and is negative  except as per HPI     OBJECTIVE:   VITAL SIGNS: BP 130/74 (BP Location: Right Arm, Patient Position: Sitting, Cuff Size: Normal)   Pulse 100   Ht 6' (1.829 m)   Wt (!) 320 lb (145.2 kg)   SpO2 96%   BMI 43.40 kg/m    PHYSICAL EXAM:  General: Pt appears well and is in NAD  Lungs: Clear with good BS bilat with no rales, rhonchi, or wheezes  Heart: RRR   Abdomen:  soft,  nontender  Extremities:  Lower extremities - No pretibial edema.   Neuro: MS is good with appropriate affect, pt is alert and Ox3    DM foot exam: 01/16/2023  The skin of the feet is without sores or ulcerations, planat callus formation noted The pedal pulses are 2+ on right and 2+ on left. The sensation is intact to a screening 5.07, 10 gram monofilament bilaterally   DATA REVIEWED:  Lab Results  Component Value Date   HGBA1C 6.4 (A) 07/22/2023   HGBA1C 6.5 (A) 01/16/2023   HGBA1C 7.9 (A) 07/17/2022         Latest Reference Range & Units 06/03/23 08:39  Sodium 135 - 145 mEq/L 139  Potassium 3.5 - 5.1 mEq/L 4.6  Chloride 96 - 112 mEq/L 105  CO2 19 - 32 mEq/L 24  Glucose 70 - 99 mg/dL 833 (H)  BUN 6 - 23 mg/dL 14  Creatinine 9.59 - 8.49 mg/dL 9.03  Calcium  8.4 - 10.5 mg/dL 9.0  GFR >39.99 mL/min 95.59  (H): Data is abnormally high    ASSESSMENT / PLAN / RECOMMENDATIONS:   1) Type 2 Diabetes Mellitus, Optimally controlled, Without complications - Most recent A1c of 6.4 %. Goal A1c < 7.0 %.    - A1c remains at optimal goal -Patient continues to lose weight, he is tolerating Mounjaro  -Will decrease metformin  as below  MEDICATIONS:  Decrease metformin  850 mg 2 times daily  Continue Jardiance  25 mg daily Continue Mounjaro  7.5 mg weekly   EDUCATION / INSTRUCTIONS: BG monitoring instructions: Patient is instructed to check his blood sugars 2-3 times a week. Call [facility] if: BG persistently < 70  I reviewed the Rule of 15 for the treatment of hypoglycemia in detail with the  patient. Literature supplied.   2) Diabetic complications:  Eye: Does not have known diabetic retinopathy.  Neuro/ Feet: Does not have known diabetic peripheral neuropathy. Renal: Patient does not have known baseline CKD. He is  on an ACEI/ARB at present.  3) Dyslipidemia :   -He he is currently on atorvastatin  and fenofibrate  -Will continue to monitor and make adjustments if no improvement in the future     F/U in 6 months      Signed electronically by: Stefano Redgie Butts, MD  [facility]  [facility] [address] [place] [place], [place] [address] Phone: [phone] FAX: [phone]   CC: Amon Aloysius BRAVO, MD [address] [place] [address] Phone: [phone]  Fax: [phone]    Return to Endocrinology clinic as below: Future Appointments  Date Time Provider Department Center  12/03/2023  8:00 AM Amon Aloysius BRAVO, MD [id number]

## 2023-07-22 ENCOUNTER — Ambulatory Visit (INDEPENDENT_AMBULATORY_CARE_PROVIDER_SITE_OTHER): Payer: BC Managed Care – PPO | Admitting: Internal Medicine

## 2023-07-22 VITALS — BP 130/74 | HR 100 | Ht 72.0 in | Wt 320.0 lb

## 2023-07-22 DIAGNOSIS — Z7985 Long-term (current) use of injectable non-insulin antidiabetic drugs: Secondary | ICD-10-CM

## 2023-07-22 DIAGNOSIS — Z7984 Long term (current) use of oral hypoglycemic drugs: Secondary | ICD-10-CM

## 2023-07-22 DIAGNOSIS — E119 Type 2 diabetes mellitus without complications: Secondary | ICD-10-CM

## 2023-07-22 LAB — POCT GLUCOSE (DEVICE FOR HOME USE): Glucose Fasting, POC: 147 mg/dL — AB (ref 70–99)

## 2023-07-22 LAB — POCT GLYCOSYLATED HEMOGLOBIN (HGB A1C): Hemoglobin A1C: 6.4 % — AB (ref 4.0–5.6)

## 2023-07-22 MED ORDER — EMPAGLIFLOZIN 25 MG PO TABS: 25.0000 mg | ORAL_TABLET | Freq: Every day | ORAL | 3 refills | Status: DC

## 2023-07-22 MED ORDER — METFORMIN HCL 850 MG PO TABS: 850.0000 mg | ORAL_TABLET | Freq: Two times a day (BID) | ORAL | 3 refills | Status: DC

## 2023-07-22 MED ORDER — TIRZEPATIDE 7.5 MG/0.5ML ~~LOC~~ SOAJ
7.5000 mg | SUBCUTANEOUS | 0 refills | Status: DC
  Filled 2023-09-29 – 2023-10-10 (×2): qty 6, 84d supply, fill #0

## 2023-07-22 NOTE — Patient Instructions (Addendum)
 Decrease  Metformin  850 mg , Two tablets  daily  Continue Jardiance  25 mg daily  Continue Mounjaro  7.5 mg ONCE weekly       HOW TO TREAT LOW BLOOD SUGARS (Blood sugar LESS THAN 70 MG/DL) Please follow the RULE OF 15 for the treatment of hypoglycemia treatment (when your (blood sugars are less than 70 mg/dL)   STEP 1: Take 15 grams of carbohydrates when your blood sugar is low, which includes:  3-4 GLUCOSE TABS  OR 3-4 OZ OF JUICE OR REGULAR SODA OR ONE TUBE OF GLUCOSE GEL    STEP 2: RECHECK blood sugar in 15 MINUTES STEP 3: If your blood sugar is still low at the 15 minute recheck --> then, go back to STEP 1 and treat AGAIN with another 15 grams of carbohydrates.

## 2023-07-31 ENCOUNTER — Other Ambulatory Visit: Payer: Self-pay | Admitting: Internal Medicine

## 2023-09-10 ENCOUNTER — Other Ambulatory Visit (HOSPITAL_BASED_OUTPATIENT_CLINIC_OR_DEPARTMENT_OTHER): Payer: Self-pay

## 2023-09-17 ENCOUNTER — Encounter (HOSPITAL_BASED_OUTPATIENT_CLINIC_OR_DEPARTMENT_OTHER): Payer: Self-pay

## 2023-09-17 ENCOUNTER — Other Ambulatory Visit (HOSPITAL_BASED_OUTPATIENT_CLINIC_OR_DEPARTMENT_OTHER): Payer: Self-pay

## 2023-09-18 ENCOUNTER — Other Ambulatory Visit (HOSPITAL_BASED_OUTPATIENT_CLINIC_OR_DEPARTMENT_OTHER): Payer: Self-pay

## 2023-09-18 MED ORDER — LISDEXAMFETAMINE DIMESYLATE 50 MG PO CAPS
50.0000 mg | ORAL_CAPSULE | Freq: Every day | ORAL | 0 refills | Status: DC
Start: 2023-09-18 — End: 2023-12-30
  Filled 2023-09-18: qty 90, 90d supply, fill #0

## 2023-09-20 ENCOUNTER — Other Ambulatory Visit (HOSPITAL_BASED_OUTPATIENT_CLINIC_OR_DEPARTMENT_OTHER): Payer: Self-pay

## 2023-09-22 ENCOUNTER — Other Ambulatory Visit (HOSPITAL_BASED_OUTPATIENT_CLINIC_OR_DEPARTMENT_OTHER): Payer: Self-pay

## 2023-09-29 ENCOUNTER — Other Ambulatory Visit (HOSPITAL_BASED_OUTPATIENT_CLINIC_OR_DEPARTMENT_OTHER): Payer: Self-pay

## 2023-10-10 ENCOUNTER — Other Ambulatory Visit (HOSPITAL_BASED_OUTPATIENT_CLINIC_OR_DEPARTMENT_OTHER): Payer: Self-pay

## 2023-11-07 DIAGNOSIS — F902 Attention-deficit hyperactivity disorder, combined type: Secondary | ICD-10-CM | POA: Diagnosis not present

## 2023-11-07 DIAGNOSIS — Z79899 Other long term (current) drug therapy: Secondary | ICD-10-CM | POA: Diagnosis not present

## 2023-11-09 ENCOUNTER — Other Ambulatory Visit: Payer: Self-pay | Admitting: Internal Medicine

## 2023-12-03 ENCOUNTER — Ambulatory Visit: Payer: BC Managed Care – PPO | Admitting: Internal Medicine

## 2023-12-03 ENCOUNTER — Encounter: Payer: Self-pay | Admitting: Internal Medicine

## 2023-12-03 VITALS — BP 132/80 | HR 94 | Temp 98.1°F | Resp 18 | Ht 72.0 in | Wt 323.1 lb

## 2023-12-03 DIAGNOSIS — E785 Hyperlipidemia, unspecified: Secondary | ICD-10-CM

## 2023-12-03 DIAGNOSIS — Z0000 Encounter for general adult medical examination without abnormal findings: Secondary | ICD-10-CM | POA: Diagnosis not present

## 2023-12-03 DIAGNOSIS — Z23 Encounter for immunization: Secondary | ICD-10-CM

## 2023-12-03 DIAGNOSIS — Z7985 Long-term (current) use of injectable non-insulin antidiabetic drugs: Secondary | ICD-10-CM

## 2023-12-03 DIAGNOSIS — E1165 Type 2 diabetes mellitus with hyperglycemia: Secondary | ICD-10-CM

## 2023-12-03 DIAGNOSIS — Z7984 Long term (current) use of oral hypoglycemic drugs: Secondary | ICD-10-CM

## 2023-12-03 LAB — CBC WITH DIFFERENTIAL/PLATELET
Basophils Absolute: 0 10*3/uL (ref 0.0–0.1)
Basophils Relative: 0.7 % (ref 0.0–3.0)
Eosinophils Absolute: 0.2 10*3/uL (ref 0.0–0.7)
Eosinophils Relative: 3.2 % (ref 0.0–5.0)
HCT: 42.9 % (ref 39.0–52.0)
Hemoglobin: 14.3 g/dL (ref 13.0–17.0)
Lymphocytes Relative: 39.1 % (ref 12.0–46.0)
Lymphs Abs: 2.6 10*3/uL (ref 0.7–4.0)
MCHC: 33.3 g/dL (ref 30.0–36.0)
MCV: 82.8 fl (ref 78.0–100.0)
Monocytes Absolute: 0.5 10*3/uL (ref 0.1–1.0)
Monocytes Relative: 7.9 % (ref 3.0–12.0)
Neutro Abs: 3.3 10*3/uL (ref 1.4–7.7)
Neutrophils Relative %: 49.1 % (ref 43.0–77.0)
Platelets: 254 10*3/uL (ref 150.0–400.0)
RBC: 5.17 Mil/uL (ref 4.22–5.81)
RDW: 14.8 % (ref 11.5–15.5)
WBC: 6.7 10*3/uL (ref 4.0–10.5)

## 2023-12-03 LAB — COMPREHENSIVE METABOLIC PANEL WITH GFR
ALT: 28 U/L (ref 0–53)
AST: 17 U/L (ref 0–37)
Albumin: 4.7 g/dL (ref 3.5–5.2)
Alkaline Phosphatase: 56 U/L (ref 39–117)
BUN: 19 mg/dL (ref 6–23)
CO2: 28 meq/L (ref 19–32)
Calcium: 9.4 mg/dL (ref 8.4–10.5)
Chloride: 107 meq/L (ref 96–112)
Creatinine, Ser: 0.92 mg/dL (ref 0.40–1.50)
GFR: 100.24 mL/min (ref >60.00)
Glucose, Bld: 136 mg/dL — ABNORMAL HIGH (ref 70–99)
Potassium: 4.3 meq/L (ref 3.5–5.1)
Sodium: 139 meq/L (ref 135–145)
Total Bilirubin: 0.4 mg/dL (ref 0.2–1.2)
Total Protein: 7.1 g/dL (ref 6.0–8.3)

## 2023-12-03 LAB — LIPID PANEL
Cholesterol: 148 mg/dL (ref 0–200)
HDL: 30.2 mg/dL — ABNORMAL LOW (ref >39.00)
LDL Cholesterol: 79 mg/dL (ref 0–99)
NonHDL: 117.8
Total CHOL/HDL Ratio: 5
Triglycerides: 194 mg/dL — ABNORMAL HIGH (ref 0.0–149.0)
VLDL: 38.8 mg/dL (ref 0.0–40.0)

## 2023-12-03 LAB — MICROALBUMIN / CREATININE URINE RATIO
Creatinine,U: 63.4 mg/dL
Microalb Creat Ratio: UNDETERMINED mg/g (ref 0.0–30.0)
Microalb, Ur: <0.7 mg/dL

## 2023-12-03 NOTE — Progress Notes (Signed)
 Subjective:    Patient ID: Spencer Good, male    DOB: 1975/07/09, 48 y.o.   MRN: [id number]  DOS:  12/03/2023 Type of visit - description: CPX  Here for CPX Other than the stress at work he feels well. Specifically denies LUTS, chest pain, does have occasional constipation.  Wt Readings from Last 3 Encounters:  12/03/23 (!) 323 lb 2 oz (146.6 kg)  07/22/23 (!) 320 lb (145.2 kg)  06/03/23 (!) 324 lb 8 oz (147.2 kg)     Review of Systems  Other than above, a 14 point review of systems is negative    Past Medical History:  Diagnosis Date   ADHD (attention deficit hyperactivity disorder)    Diabetes (HCC)    Diverticulitis of colon with perforation    s/p sigmoid colectomy   Gout    Hernia, umbilical 2016   Laparoscopic umbilical hernia repair with mesh- s/p repair-resolved.   History of colon polyps    Hyperlipidemia    Hypertension    Insomnia    Obesity    OSA (obstructive sleep apnea) dx 05-2013   Rx a Cpap 3-15, can't use - no longer has machine.   Sleep apnea    doesn't use CPAP    Past Surgical History:  Procedure Laterality Date   COLONOSCOPY     EYE SURGERY     INSERTION OF MESH N/A 02/01/2015   Procedure: INSERTION OF MESH;  Surgeon: Enid Harry, MD;  [facility];  Service: General;  Laterality: N/A;   LAPAROSCOPIC SIGMOID COLECTOMY N/A 05/24/2016   Procedure: LAPAROSCOPIC SIGMOID COLECTOMY;  Surgeon: Ayesha Lente, MD;  Location: [facility];  Service: General;  Laterality: N/A;   LASIK Bilateral    RADIOLOGY WITH ANESTHESIA N/A 09/20/2021   Procedure: MRI WITH CERVICAL SPINE WITHOUT CONTRAST;  Surgeon: Radiologist, Medication, MD;  [facility];  Service: Radiology;  Laterality: N/A;   SHOULDER ARTHROSCOPY Left 04/24/2020   Procedure: LEFT SHOULDER MANIPULATION UNDER ANESTHESIA, ROTATOR INTERVAL RELEASE, DEBRIDEMENT, BICEPS TENODESIS;  Surgeon: Jasmine Mesi, MD;  [facility];  Service: Orthopedics;  Laterality: Left;   UMBILICAL  HERNIA REPAIR N/A 02/01/2015   Procedure: LAPAROSCOPIC UMBILICAL HERNIA REPAIR WITH MESH;  Surgeon: Enid Harry, MD;  [facility];  Service: General;  Laterality: N/A;   Social History   Social History Narrative   Lives by himself     Current Outpatient Medications  Medication Instructions   atorvastatin  (LIPITOR) 20 mg, Oral, Daily   empagliflozin  (JARDIANCE ) 25 mg, Oral, Daily   fenofibrate  160 mg, Oral, Daily   glucose blood (ACCU-CHEK GUIDE) test strip Check blood sugars once daily   lisdexamfetamine (VYVANSE ) 50 MG capsule Take 1 capsule by mouth daily   losartan  (COZAAR ) 100 mg, Oral, Daily   metFORMIN  (GLUCOPHAGE ) 850 mg, Oral, 2 times daily with meals   Mounjaro  7.5 mg, Subcutaneous, Weekly   Multiple Vitamin (MULTIVITAMIN) tablet 1 tablet, Daily   Probiotic Product (PROBIOTIC PO) 1 tablet, Daily   psyllium (METAMUCIL) 58.6 % powder 1 packet, Daily PRN       Objective:   Physical Exam BP 132/80   Pulse 94   Temp 98.1 F (36.7 C) (Oral)   Resp 18   Ht 6' (1.829 m)   Wt (!) 323 lb 2 oz (146.6 kg)   SpO2 95%   BMI 43.82 kg/m  General: Well developed, NAD, BMI noted Neck: No  thyromegaly  HEENT:  Normocephalic . Face symmetric, atraumatic Lungs:  CTA B  Normal respiratory effort, no intercostal retractions, no accessory muscle use. Heart: RRR,  no murmur.  Abdomen:  Not distended, soft, non-tender. No rebound or rigidity.   DM foot exam: No edema, good pedal pulses, pinprick examination normal Skin: Exposed areas without rash. Not pale. Not jaundice Neurologic:  alert & oriented X3.  Speech normal, gait appropriate for age and unassisted Strength symmetric and appropriate for age.  Psych: Cognition and judgment appear intact.  Cooperative with normal attention span and concentration.  Behavior appropriate. No anxious or depressed appearing.     Assessment     Assessment DM- per endo -- dx 2014, took invokana , metformin , dc ~06-2014 as diet  improved, a1c 13 ( 08-2015), meds restarted HTN Dyslipidemia Gout Morbid obesity OSA - not using cpap as off 06-2021, intolerant. ADHD- per other provider  Insomnia H/o diverticulitis, sigmoid,w/ perf, conservative Rx 10-2014 , Admitted again 08/2015, abscess, s/p drainage. Nicotine: used to smoke,  wellbutrin  intolerant, now vapes   PLAN: Here for CPX - Td 06-2020 -PNM 23: 2016;  PNM 13 2017.  PNM 20 today -Vaccines I recommend:   COVID booster, flu shot this fall.  CCS- cscope 12/2014, polyp, 5 years, C-scope 04/07/2020, no polyps, 10 years, see report -Labs: See orders -Diet and exercise: discussed   Other issues addressed DM: LOV endo  07/2023, A1c 6.4.  On Jardiance , Mounjaro .  Foot exam negative.  Denies side effects from Jardiance .  No rash. HTN: Ambulatory BPs always WNL when checked, continue losartan .  Checking labs High cholesterol: On Lipitor, fenofibrate , checking labs. Morbid obesity: On 2 medications that help with weight loss, encourage a healthy diet, portion control. Gout: Silent for years. RTC 6 months

## 2023-12-03 NOTE — Assessment & Plan Note (Signed)
 Here for CPX - Td 06-2020 -PNM 23: 2016;  PNM 13 2017.  PNM 20 today -Vaccines I recommend:   COVID booster, flu shot this fall.  CCS- cscope 12/2014, polyp, 5 years, C-scope 04/07/2020, no polyps, 10 years, see report -Labs: See orders -Diet and exercise: discussed

## 2023-12-03 NOTE — Patient Instructions (Signed)
 Vaccines to consider COVID booster Flu shot every fall  Continue working on your diet: Portion control, calorie counting? Healthier choices, less carbohydrates  Check the  blood pressure regularly Blood pressure goal:  between 110/65 and  135/85. If it is consistently higher or lower, let me know     GO TO THE LAB :  Get the blood work   Your results will be posted on MyChart with my comments  Next office visit for a checkup in 6 months Please make an appointment before you leave today

## 2023-12-03 NOTE — Assessment & Plan Note (Signed)
 Here for CPX   Other issues addressed DM: LOV endo  07/2023, A1c 6.4.  On Jardiance , Mounjaro .  Foot exam negative.  Denies side effects from Jardiance .  No rash. HTN: Ambulatory BPs always WNL when checked, continue losartan .  Checking labs High cholesterol: On Lipitor, fenofibrate , checking labs. Morbid obesity: On 2 medications that help with weight loss, encourage a healthy diet, portion control. Gout: Silent for years. RTC 6 months

## 2023-12-04 ENCOUNTER — Ambulatory Visit: Payer: Self-pay | Admitting: Internal Medicine

## 2023-12-29 ENCOUNTER — Other Ambulatory Visit (HOSPITAL_BASED_OUTPATIENT_CLINIC_OR_DEPARTMENT_OTHER): Payer: Self-pay

## 2023-12-29 ENCOUNTER — Other Ambulatory Visit: Payer: Self-pay

## 2023-12-29 ENCOUNTER — Other Ambulatory Visit: Payer: Self-pay | Admitting: Internal Medicine

## 2023-12-29 MED ORDER — MOUNJARO 7.5 MG/0.5ML ~~LOC~~ SOAJ
7.5000 mg | SUBCUTANEOUS | 0 refills | Status: DC
  Filled 2023-12-29: qty 6, 84d supply, fill #0

## 2023-12-30 ENCOUNTER — Other Ambulatory Visit (HOSPITAL_BASED_OUTPATIENT_CLINIC_OR_DEPARTMENT_OTHER): Payer: Self-pay

## 2023-12-30 MED ORDER — LISDEXAMFETAMINE DIMESYLATE 50 MG PO CAPS
50.0000 mg | ORAL_CAPSULE | Freq: Every day | ORAL | 0 refills | Status: DC
  Filled 2023-12-30: qty 90, 90d supply, fill #0

## 2024-01-16 NOTE — Progress Notes (Signed)
 Name: Spencer Good  MRN/ DOB: [id number], Jun 05, 1976   Age/ Sex: 48 y.o., male    PCP: Amon Aloysius BRAVO, MD   Reason for Endocrinology Evaluation: Type 2 Diabetes Mellitus     Date of Initial Endocrinology Visit: 02/25/2022    PATIENT IDENTIFIER: Spencer Good is a 48 y.o. male with a past medical history of T2DM, OSA and Dyslipidemia . The patient presented for initial endocrinology clinic visit on 07/17/2022 for consultative assistance with his diabetes management.    HPI: Spencer Good was    Diagnosed with DM 2014 Prior Medications tried/Intolerance: Ozempic  - didn't remember            Hemoglobin A1c has ranged from 7.2% in 2022, peaking at 13.9% in 2017. 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  We switched Rybelsus  to Mounjaro    SUBJECTIVE:   During the last visit (07/22/2023): A1c 6.4%    Today (01/19/24): Spencer Good is here for follow-up on diabetes management.  He  checks his blood sugars occasionally. The patient has not had hypoglycemic episodes since the last clinic visit  Patient has been noted weight gain Denies nausea, vomiting  Continues with  occasional constipation, uses OTC stool softners    HOME DIABETES REGIMEN: Metformin  850  mg 2 tabs daily - continues with taking 3 tabs daily  Jardiance  25 mg daily Mounjaro  7.5 mg weekly    Statin: yes ACE-I/ARB: yes   METER DOWNLOAD SUMMARY: n/a     DIABETIC COMPLICATIONS: Microvascular complications:   Denies: CKD, retinopathy  Last eye exam: Completed 2023  Macrovascular complications:   Denies: CAD, PVD, CVA   PAST HISTORY: Past Medical History:  Past Medical History:  Diagnosis Date   ADHD (attention deficit  hyperactivity disorder)    Diabetes (HCC)    Diverticulitis of colon with perforation    s/p sigmoid colectomy   Gout    Hernia, umbilical 2016   Laparoscopic umbilical hernia repair with mesh- s/p repair-resolved.   History of colon polyps    Hyperlipidemia    Hypertension    Insomnia    Obesity    OSA (obstructive sleep apnea) dx 05-2013   Rx a Cpap 3-15, can't use - no longer has machine.   Sleep apnea    doesn't use CPAP   Past Surgical History:  Past Surgical History:  Procedure Laterality Date   COLONOSCOPY     EYE SURGERY     INSERTION OF MESH N/A 02/01/2015   Procedure: INSERTION OF MESH;  Surgeon: Donnice Bury, MD;  [facility];  Service: General;  Laterality: N/A;   LAPAROSCOPIC SIGMOID COLECTOMY N/A 05/24/2016   Procedure: LAPAROSCOPIC SIGMOID COLECTOMY;  Surgeon: Morene Olives, MD;  Location: [facility];  Service: General;  Laterality: N/A;   LASIK Bilateral    RADIOLOGY WITH ANESTHESIA N/A 09/20/2021   Procedure: MRI WITH CERVICAL SPINE WITHOUT CONTRAST;  Surgeon: Radiologist, Medication, MD;  Location: [facility];  Service: Radiology;  Laterality: N/A;   SHOULDER ARTHROSCOPY Left 04/24/2020   Procedure: LEFT SHOULDER MANIPULATION UNDER ANESTHESIA, ROTATOR INTERVAL RELEASE, DEBRIDEMENT, BICEPS TENODESIS;  Surgeon: Addie Cordella Hamilton, MD;  Location: [facility];  Service: Orthopedics;  Laterality: Left;   UMBILICAL HERNIA REPAIR  N/A 02/01/2015   Procedure: LAPAROSCOPIC UMBILICAL HERNIA REPAIR WITH MESH;  Surgeon: Donnice Bury, MD;  Location: [facility] OR;  Service: General;  Laterality: N/A;    Social History:  reports that he quit smoking about 9 years ago. His smoking use included cigarettes. He started smoking about 31 years ago. He has a 22.4 pack-year smoking history. He has never used smokeless tobacco. He reports current alcohol use. He reports that he does not use drugs. Family History:  Family History  Problem Relation Age of Onset   Stroke Father        F, PGF    CAD Other        MGF   Heart disease Maternal Grandfather    Colon cancer Neg Hx    Prostate cancer Neg Hx    Diabetes Neg Hx        distant fam members   Colon polyps Neg Hx    Esophageal cancer Neg Hx    Gallbladder disease Neg Hx    Kidney disease Neg Hx    Rectal cancer Neg Hx    Stomach cancer Neg Hx      HOME MEDICATIONS: Allergies as of 01/19/2024   No Known Allergies      Medication List        Accurate as of January 19, 2024  7:56 AM. If you have any questions, ask your nurse or doctor.          Accu-Chek Guide test strip Generic drug: glucose blood Check blood sugars once daily   atorvastatin  20 MG tablet Commonly known as: LIPITOR Take 1 tablet (20 mg total) by mouth daily.   empagliflozin  25 MG Tabs tablet Commonly known as: Jardiance  Take 1 tablet (25 mg total) by mouth daily.   fenofibrate  160 MG tablet Take 1 tablet (160 mg total) by mouth daily.   lisdexamfetamine 50 MG capsule Commonly known as: VYVANSE  Take 1 capsule (50 mg total) by mouth daily.   losartan  100 MG tablet Commonly known as: COZAAR  Take 1 tablet (100 mg total) by mouth daily.   metFORMIN  850 MG tablet Commonly known as: GLUCOPHAGE  Take 1 tablet (850 mg total) by mouth 2 (two) times daily with a meal.   Mounjaro  7.5 MG/0.5ML Pen Generic drug: tirzepatide  Inject 7.5 mg into the skin once a week.   multivitamin tablet Take 1 tablet by mouth daily.   PROBIOTIC PO Take 1 tablet by mouth daily.   psyllium 58.6 % powder Commonly known as: METAMUCIL Take 1 packet by mouth daily as needed (constipation).         ALLERGIES: No Known Allergies   REVIEW OF SYSTEMS: A comprehensive ROS was conducted with the patient and is negative except as per HPI     OBJECTIVE:   VITAL SIGNS: BP 128/82 (BP Location: Left Arm, Patient Position: Sitting, Cuff Size: Large)   Pulse (!) 104   Ht 6' (1.829 m)   Wt (!) 324 lb (147 kg)   SpO2 99%   BMI 43.94 kg/m     Filed  Weights   01/19/24 0751  Weight: (!) 324 lb (147 kg)     PHYSICAL EXAM:  General: Pt appears well and is in NAD  Lungs: Clear with good BS bilat   Heart: RRR   Abdomen:  soft, nontender  Extremities:  Lower extremities - No pretibial edema.   Neuro: MS is good with appropriate affect, pt is alert and Ox3    DM foot exam: 01/19/2024  The skin of the feet is without sores or ulcerations, plantar callus formation noted The pedal pulses are 2+ on right and 2+ on left. The sensation is intact to a screening 5.07, 10 gram monofilament bilaterally   DATA REVIEWED:  Lab Results  Component Value Date   HGBA1C 6.4 (A) 07/22/2023   HGBA1C 6.5 (A) 01/16/2023   HGBA1C 7.9 (A) 07/17/2022          Latest Reference Range & Units 12/03/23 08:55  Sodium 135 - 145 mEq/L 139  Potassium 3.5 - 5.1 mEq/L 4.3  Chloride 96 - 112 mEq/L 107  CO2 19 - 32 mEq/L 28  Glucose 70 - 99 mg/dL 863 (H)  BUN 6 - 23 mg/dL 19  Creatinine 9.59 - 8.49 mg/dL 9.07  Calcium  8.4 - 10.5 mg/dL 9.4  Alkaline Phosphatase 39 - 117 U/L 56  Albumin 3.5 - 5.2 g/dL 4.7  AST 0 - 37 U/L 17  ALT 0 - 53 U/L 28  Total Protein 6.0 - 8.3 g/dL 7.1  Total Bilirubin 0.2 - 1.2 mg/dL 0.4  GFR >39.99 mL/min 100.24    Latest Reference Range & Units 12/03/23 08:55  Creatinine,U mg/dL 36.5  Microalb, Ur mg/dL <9.2  MICROALB/CREAT RATIO 0.0 - 30.0 mg/g Unable to calculate    In office BG 139 Mg/DL  ASSESSMENT / PLAN / RECOMMENDATIONS:   1) Type 2 Diabetes Mellitus, Optimally controlled, Without complications - Most recent A1c of 6.5 %. Goal A1c < 7.0 %.    - A1c remains at optimal goal - He continues to take metformin  3 tablets daily, I have again advised the patient to decrease this to twice daily - I will increase Mounjaro , patient has been noted weight gain  MEDICATIONS:  Decrease Metformin  850 mg, 2 tabs daily  Continue Jardiance  25 mg daily Increase Mounjaro  10 mg weekly   EDUCATION / INSTRUCTIONS: BG  monitoring instructions: Patient is instructed to check his blood sugars 2-3 times a week. Call [facility] if: BG persistently < 70  I reviewed the Rule of 15 for the treatment of hypoglycemia in detail with the patient. Literature supplied.   2) Diabetic complications:  Eye: Does not have known diabetic retinopathy.  Neuro/ Feet: Does not have known diabetic peripheral neuropathy. Renal: Patient does not have known baseline CKD. He is  on an ACEI/ARB at present.  3) Dyslipidemia :   -He he is currently on atorvastatin  and fenofibrate  - TG's have trended down, LDL at goal   F/U in 6 months      Signed electronically by: Stefano Redgie Butts, MD  [facility]  [facility] [address] [place] [place], [place] [address] Phone: [phone] FAX: [phone]   CC: Amon Aloysius BRAVO, MD [address] [place] [address] Phone: [phone]  Fax: [phone]    Return to Endocrinology clinic as below: No future appointments.

## 2024-01-19 ENCOUNTER — Ambulatory Visit (INDEPENDENT_AMBULATORY_CARE_PROVIDER_SITE_OTHER): Payer: BC Managed Care – PPO | Admitting: Internal Medicine

## 2024-01-19 ENCOUNTER — Encounter: Payer: Self-pay | Admitting: Internal Medicine

## 2024-01-19 ENCOUNTER — Other Ambulatory Visit (HOSPITAL_BASED_OUTPATIENT_CLINIC_OR_DEPARTMENT_OTHER): Payer: Self-pay

## 2024-01-19 ENCOUNTER — Other Ambulatory Visit: Payer: Self-pay

## 2024-01-19 VITALS — BP 128/82 | HR 104 | Ht 72.0 in | Wt 324.0 lb

## 2024-01-19 DIAGNOSIS — Z7985 Long-term (current) use of injectable non-insulin antidiabetic drugs: Secondary | ICD-10-CM

## 2024-01-19 DIAGNOSIS — E785 Hyperlipidemia, unspecified: Secondary | ICD-10-CM | POA: Diagnosis not present

## 2024-01-19 DIAGNOSIS — E119 Type 2 diabetes mellitus without complications: Secondary | ICD-10-CM | POA: Diagnosis not present

## 2024-01-19 DIAGNOSIS — Z7984 Long term (current) use of oral hypoglycemic drugs: Secondary | ICD-10-CM

## 2024-01-19 LAB — POCT GLYCOSYLATED HEMOGLOBIN (HGB A1C): Hemoglobin A1C: 6.5 % — AB (ref 4.0–5.6)

## 2024-01-19 LAB — POCT GLUCOSE (DEVICE FOR HOME USE): POC Glucose: 139 mg/dL — AB (ref 70–99)

## 2024-01-19 MED ORDER — EMPAGLIFLOZIN 25 MG PO TABS
25.0000 mg | ORAL_TABLET | Freq: Every day | ORAL | 3 refills | Status: DC
  Filled 2024-01-19: qty 90, 90d supply, fill #0

## 2024-01-19 MED ORDER — TIRZEPATIDE 10 MG/0.5ML ~~LOC~~ SOAJ
10.0000 mg | SUBCUTANEOUS | 3 refills | Status: DC
  Filled 2024-01-19 – 2024-03-25 (×2): qty 6, 84d supply, fill #0
  Filled 2024-06-16: qty 6, 84d supply, fill #1

## 2024-01-19 NOTE — Patient Instructions (Addendum)
 Decrease Metformin  850 mg , Two tablets  daily  Continue Jardiance  25 mg daily  Increase Mounjaro  10 mg ONCE weekly       HOW TO TREAT LOW BLOOD SUGARS (Blood sugar LESS THAN 70 MG/DL) Please follow the RULE OF 15 for the treatment of hypoglycemia treatment (when your (blood sugars are less than 70 mg/dL)   STEP 1: Take 15 grams of carbohydrates when your blood sugar is low, which includes:  3-4 GLUCOSE TABS  OR 3-4 OZ OF JUICE OR REGULAR SODA OR ONE TUBE OF GLUCOSE GEL    STEP 2: RECHECK blood sugar in 15 MINUTES STEP 3: If your blood sugar is still low at the 15 minute recheck --> then, go back to STEP 1 and treat AGAIN with another 15 grams of carbohydrates.

## 2024-01-29 ENCOUNTER — Other Ambulatory Visit (HOSPITAL_BASED_OUTPATIENT_CLINIC_OR_DEPARTMENT_OTHER): Payer: Self-pay

## 2024-01-30 ENCOUNTER — Other Ambulatory Visit: Payer: Self-pay | Admitting: Internal Medicine

## 2024-03-25 ENCOUNTER — Other Ambulatory Visit (HOSPITAL_BASED_OUTPATIENT_CLINIC_OR_DEPARTMENT_OTHER): Payer: Self-pay

## 2024-03-25 ENCOUNTER — Other Ambulatory Visit: Payer: Self-pay

## 2024-03-25 MED ORDER — METFORMIN HCL 850 MG PO TABS
850.0000 mg | ORAL_TABLET | Freq: Two times a day (BID) | ORAL | 3 refills | Status: DC
  Filled 2024-05-04: qty 180, 90d supply, fill #0

## 2024-04-12 ENCOUNTER — Other Ambulatory Visit (HOSPITAL_BASED_OUTPATIENT_CLINIC_OR_DEPARTMENT_OTHER): Payer: Self-pay

## 2024-04-12 MED ORDER — LISDEXAMFETAMINE DIMESYLATE 50 MG PO CAPS
50.0000 mg | ORAL_CAPSULE | Freq: Every day | ORAL | 0 refills | Status: DC
  Filled 2024-04-12: qty 90, 90d supply, fill #0

## 2024-05-04 ENCOUNTER — Other Ambulatory Visit: Payer: Self-pay

## 2024-05-04 ENCOUNTER — Other Ambulatory Visit (HOSPITAL_BASED_OUTPATIENT_CLINIC_OR_DEPARTMENT_OTHER): Payer: Self-pay

## 2024-05-04 ENCOUNTER — Other Ambulatory Visit: Payer: Self-pay | Admitting: Internal Medicine

## 2024-05-04 MED ORDER — EMPAGLIFLOZIN 25 MG PO TABS
25.0000 mg | ORAL_TABLET | Freq: Every day | ORAL | 3 refills | Status: DC
  Filled 2024-05-31: qty 90, 90d supply, fill #0

## 2024-05-06 DIAGNOSIS — Z79899 Other long term (current) drug therapy: Secondary | ICD-10-CM | POA: Diagnosis not present

## 2024-05-06 DIAGNOSIS — F902 Attention-deficit hyperactivity disorder, combined type: Secondary | ICD-10-CM | POA: Diagnosis not present

## 2024-05-31 ENCOUNTER — Other Ambulatory Visit (HOSPITAL_BASED_OUTPATIENT_CLINIC_OR_DEPARTMENT_OTHER): Payer: Self-pay

## 2024-06-01 ENCOUNTER — Other Ambulatory Visit: Payer: Self-pay

## 2024-06-01 MED ORDER — EMPAGLIFLOZIN 25 MG PO TABS
25.0000 mg | ORAL_TABLET | Freq: Every day | ORAL | 3 refills | Status: DC
  Filled 2024-06-02: qty 90, 90d supply, fill #0

## 2024-06-02 ENCOUNTER — Other Ambulatory Visit (HOSPITAL_BASED_OUTPATIENT_CLINIC_OR_DEPARTMENT_OTHER): Payer: Self-pay

## 2024-06-02 LAB — OPHTHALMOLOGY REPORT-SCANNED

## 2024-06-18 ENCOUNTER — Other Ambulatory Visit (HOSPITAL_BASED_OUTPATIENT_CLINIC_OR_DEPARTMENT_OTHER): Payer: Self-pay

## 2024-07-13 ENCOUNTER — Telehealth (INDEPENDENT_AMBULATORY_CARE_PROVIDER_SITE_OTHER): Admitting: Internal Medicine

## 2024-07-13 ENCOUNTER — Other Ambulatory Visit (HOSPITAL_BASED_OUTPATIENT_CLINIC_OR_DEPARTMENT_OTHER): Payer: Self-pay

## 2024-07-13 ENCOUNTER — Encounter: Payer: Self-pay | Admitting: Internal Medicine

## 2024-07-13 VITALS — Ht 72.0 in | Wt 317.1 lb

## 2024-07-13 DIAGNOSIS — E785 Hyperlipidemia, unspecified: Secondary | ICD-10-CM

## 2024-07-13 DIAGNOSIS — Z7985 Long-term (current) use of injectable non-insulin antidiabetic drugs: Secondary | ICD-10-CM | POA: Diagnosis not present

## 2024-07-13 DIAGNOSIS — Z7984 Long term (current) use of oral hypoglycemic drugs: Secondary | ICD-10-CM

## 2024-07-13 DIAGNOSIS — E119 Type 2 diabetes mellitus without complications: Secondary | ICD-10-CM

## 2024-07-13 MED ORDER — EMPAGLIFLOZIN 25 MG PO TABS
25.0000 mg | ORAL_TABLET | Freq: Every day | ORAL | 3 refills | Status: AC
  Filled 2024-07-13: qty 90, 90d supply, fill #0

## 2024-07-13 MED ORDER — METFORMIN HCL 850 MG PO TABS
850.0000 mg | ORAL_TABLET | Freq: Two times a day (BID) | ORAL | 3 refills | Status: AC
  Filled 2024-07-13 – 2024-07-20 (×2): qty 180, 90d supply, fill #0

## 2024-07-13 MED ORDER — TIRZEPATIDE 12.5 MG/0.5ML ~~LOC~~ SOAJ
12.5000 mg | SUBCUTANEOUS | 3 refills | Status: AC
  Filled 2024-07-13: qty 6, 84d supply, fill #0

## 2024-07-13 NOTE — Progress Notes (Signed)
 Virtual Visit via Video Note  I connected with Spencer Good on 07/13/24 at  8:10 AM EST by a video enabled telemedicine application and verified that I am speaking with the correct person using two identifiers.   I discussed the limitations of evaluation and management by telemedicine and the availability of in person appointments. The patient expressed understanding and agreed to proceed.   -Location of the patient : home -Location of the provider : Office -The names of all persons participating in the telemedicine service : Pt and myself      Name: Spencer Good  MRN/ DOB: [id number], 1976-01-27   Age/ Sex: 49 y.o., male    PCP: Amon Aloysius BRAVO, MD   Reason for Endocrinology Evaluation: Type 2 Diabetes Mellitus     Date of Initial Endocrinology Visit: 02/25/2022    PATIENT IDENTIFIER: Spencer Good is a 49 y.o. male with a past medical history of T2DM, OSA and Dyslipidemia . The patient presented for initial endocrinology clinic visit on 07/17/2022 for consultative assistance with his diabetes management.    HPI: Spencer Good was    Diagnosed with DM 2014 Prior Medications tried/Intolerance: Ozempic  - didn't remember            Hemoglobin A1c has ranged from 7.2% in 2022, peaking at 13.9% in 2017. 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  We switched Rybelsus  to Mounjaro    SUBJECTIVE:   During the last visit (01/19/2024): A1c 6.5%    Today (07/13/24): Spencer Good is here for follow-up on diabetes management.  He  checks his blood sugars rarely. The patient has not had hypoglycemic episodes since the last clinic visit   Patient has noted initial weight loss with increasing dose of Mounjaro , but  over the past 2 weeks he has noted weight regain Denies recent nausea Has occasional constipation   HOME DIABETES REGIMEN: Metformin  850  mg 2 tabs daily  Jardiance  25 mg daily Mounjaro  10 mg weekly    Statin: yes ACE-I/ARB: yes   METER DOWNLOAD SUMMARY: n/a     DIABETIC COMPLICATIONS: Microvascular complications:   Denies: CKD, retinopathy  Last eye exam: Completed 2023  Macrovascular complications:   Denies: CAD, PVD, CVA   PAST HISTORY: Past Medical History:  Past Medical History:  Diagnosis Date   ADHD (attention deficit hyperactivity disorder)    Diabetes (HCC)    Diverticulitis of colon with perforation    s/p sigmoid colectomy   Gout    Hernia, umbilical 2016   Laparoscopic umbilical hernia repair with mesh- s/p repair-resolved.   History of colon polyps    Hyperlipidemia    Hypertension    Insomnia    Obesity    OSA (obstructive sleep apnea) dx 05-2013   Rx a Cpap 3-15, can't use - no longer has machine.   Sleep apnea    doesn't use CPAP   Past Surgical History:  Past Surgical History:  Procedure Laterality Date   COLONOSCOPY     EYE SURGERY     INSERTION OF MESH N/A 02/01/2015   Procedure: INSERTION OF MESH;  Surgeon: Donnice Bury, MD;  Location: [facility];  Service: General;  Laterality: N/A;   LAPAROSCOPIC SIGMOID COLECTOMY  N/A 05/24/2016   Procedure: LAPAROSCOPIC SIGMOID COLECTOMY;  Surgeon: Morene Olives, MD;  Location: [facility];  Service: General;  Laterality: N/A;   LASIK Bilateral    RADIOLOGY WITH ANESTHESIA N/A 09/20/2021   Procedure: MRI WITH CERVICAL SPINE WITHOUT CONTRAST;  Surgeon: Radiologist, Medication, MD;  Location: [facility];  Service: Radiology;  Laterality: N/A;   SHOULDER ARTHROSCOPY Left 04/24/2020   Procedure: LEFT SHOULDER MANIPULATION UNDER ANESTHESIA, ROTATOR INTERVAL RELEASE, DEBRIDEMENT, BICEPS TENODESIS;  Surgeon: Addie Cordella Hamilton, MD;  Location: [facility];  Service: Orthopedics;  Laterality: Left;   UMBILICAL HERNIA  REPAIR N/A 02/01/2015   Procedure: LAPAROSCOPIC UMBILICAL HERNIA REPAIR WITH MESH;  Surgeon: Donnice Bury, MD;  Location: [facility];  Service: General;  Laterality: N/A;    Social History:  reports that he quit smoking about 9 years ago. His smoking use included cigarettes. He started smoking about 31 years ago. He has a 22.4 pack-year smoking history. He has never used smokeless tobacco. He reports current alcohol use. He reports that he does not use drugs. Family History:  Family History  Problem Relation Age of Onset   Stroke Father        F, PGF   CAD Other        MGF   Heart disease Maternal Grandfather    Colon cancer Neg Hx    Prostate cancer Neg Hx    Diabetes Neg Hx        distant fam members   Colon polyps Neg Hx    Esophageal cancer Neg Hx    Gallbladder disease Neg Hx    Kidney disease Neg Hx    Rectal cancer Neg Hx    Stomach cancer Neg Hx      HOME MEDICATIONS: Allergies as of 07/13/2024   No Known Allergies      Medication List        Accurate as of July 13, 2024  6:56 AM. If you have any questions, ask your nurse or doctor.          Accu-Chek Guide test strip Generic drug: glucose blood Check blood sugars once daily   atorvastatin  20 MG tablet Commonly known as: LIPITOR Take 1 tablet (20 mg total) by mouth daily.   fenofibrate  160 MG tablet Take 1 tablet (160 mg total) by mouth daily.   Jardiance  25 MG Tabs tablet Generic drug: empagliflozin  Take 1 tablet (25 mg total) by mouth daily.   lisdexamfetamine  50 MG capsule Commonly known as: VYVANSE  Take 1 capsule (50 mg total) by mouth daily.   losartan  100 MG tablet Commonly known as: COZAAR  Take 1 tablet (100 mg total) by mouth daily.   metFORMIN  850 MG tablet Commonly known as: GLUCOPHAGE  Take 1 tablet (850 mg total) by mouth 2 (two) times daily with a meal.   Mounjaro  10 MG/0.5ML Pen Generic drug: tirzepatide  Inject 10 mg into the skin once a week.   multivitamin tablet Take 1  tablet by mouth daily.   PROBIOTIC PO Take 1 tablet by mouth daily.         ALLERGIES: No Known Allergies   REVIEW OF SYSTEMS: A comprehensive ROS was conducted with the patient and is negative except as per HPI     OBJECTIVE:   VITAL SIGNS: There were no vitals taken for this visit.    There were no vitals filed for this visit.    PHYSICAL EXAM:  General: Pt appears well and is in NAD  Neuro: MS is good with appropriate  affect, pt is alert and Ox3    DM foot exam: 01/19/2024  The skin of the feet is without sores or ulcerations, plantar callus formation noted The pedal pulses are 2+ on right and 2+ on left. The sensation is intact to a screening 5.07, 10 gram monofilament bilaterally   DATA REVIEWED:  Lab Results  Component Value Date   HGBA1C 6.5 (A) 01/19/2024   HGBA1C 6.4 (A) 07/22/2023   HGBA1C 6.5 (A) 01/16/2023          Latest Reference Range & Units 12/03/23 08:55  Sodium 135 - 145 mEq/L 139  Potassium 3.5 - 5.1 mEq/L 4.3  Chloride 96 - 112 mEq/L 107  CO2 19 - 32 mEq/L 28  Glucose 70 - 99 mg/dL 863 (H)  BUN 6 - 23 mg/dL 19  Creatinine 9.59 - 8.49 mg/dL 9.07  Calcium  8.4 - 10.5 mg/dL 9.4  Alkaline Phosphatase 39 - 117 U/L 56  Albumin 3.5 - 5.2 g/dL 4.7  AST 0 - 37 U/L 17  ALT 0 - 53 U/L 28  Total Protein 6.0 - 8.3 g/dL 7.1  Total Bilirubin 0.2 - 1.2 mg/dL 0.4  GFR >39.99 mL/min 100.24    Latest Reference Range & Units 12/03/23 08:55  Creatinine,U mg/dL 36.5  Microalb, Ur mg/dL <9.2  MICROALB/CREAT RATIO 0.0 - 30.0 mg/g Unable to calculate    ASSESSMENT / PLAN / RECOMMENDATIONS:   1) Type 2 Diabetes Mellitus, Optimally controlled, Without complications -. Goal A1c < 7.0 %.    - This was a virtual visit -Patient has done well with dietary modifications, he has not been able to incorporate exercise yet into his daily routine -Given weight regain, I have recommended increasing Mounjaro   MEDICATIONS:  Continue metformin  850 mg, 2 tabs  daily  Continue Jardiance  25 mg daily Increase Mounjaro  12.5 mg weekly   EDUCATION / INSTRUCTIONS: BG monitoring instructions: Patient is instructed to check his blood sugars 2-3 times a week. Call [facility] if: BG persistently < 70  I reviewed the Rule of 15 for the treatment of hypoglycemia in detail with the patient. Literature supplied.   2) Diabetic complications:  Eye: Does not have known diabetic retinopathy.  Neuro/ Feet: Does not have known diabetic peripheral neuropathy. Renal: Patient does not have known baseline CKD. He is  on an ACEI/ARB at present.  3) Dyslipidemia :   -He he is currently on atorvastatin  and fenofibrate  - Triglycerides have been trending down, LDL at goal - No changes, will continue to monitor   F/U in 6 months      Signed electronically by: Stefano Redgie Butts, MD  [facility]  [facility] [address] [place] [place], [place] [address] Phone: [phone] FAX: [phone]   CC: Amon Aloysius BRAVO, MD [address] [facility] [address] [address] [place] [address] Phone: [phone]  Fax: [phone]    Return to Endocrinology clinic as below: Future Appointments  Date Time Provider Department Center  07/13/2024  8:10 AM Mariangela Heldt, Donell Redgie, MD [id number] None

## 2024-07-14 ENCOUNTER — Other Ambulatory Visit: Payer: Self-pay

## 2024-07-14 ENCOUNTER — Other Ambulatory Visit (HOSPITAL_BASED_OUTPATIENT_CLINIC_OR_DEPARTMENT_OTHER): Payer: Self-pay

## 2024-07-19 ENCOUNTER — Other Ambulatory Visit (HOSPITAL_COMMUNITY): Payer: Self-pay

## 2024-07-19 ENCOUNTER — Telehealth: Payer: Self-pay

## 2024-07-20 ENCOUNTER — Other Ambulatory Visit (HOSPITAL_BASED_OUTPATIENT_CLINIC_OR_DEPARTMENT_OTHER): Payer: Self-pay

## 2024-07-20 ENCOUNTER — Other Ambulatory Visit: Payer: Self-pay | Admitting: Internal Medicine

## 2024-07-20 ENCOUNTER — Other Ambulatory Visit (HOSPITAL_COMMUNITY): Payer: Self-pay

## 2024-07-20 NOTE — Telephone Encounter (Signed)
 Pharmacy Patient Advocate Encounter  Received notification from [facility] that Prior Authorization for Mounjaro  12.5mg  has been APPROVED from 07/19/2024 to 07/19/2025   PA #/Case ID/Reference #: [id number]

## 2024-07-21 ENCOUNTER — Other Ambulatory Visit: Payer: Self-pay

## 2024-07-21 ENCOUNTER — Ambulatory Visit: Admitting: Internal Medicine

## 2024-07-21 ENCOUNTER — Other Ambulatory Visit (HOSPITAL_BASED_OUTPATIENT_CLINIC_OR_DEPARTMENT_OTHER): Payer: Self-pay

## 2024-07-21 MED ORDER — LISDEXAMFETAMINE DIMESYLATE 50 MG PO CAPS
50.0000 mg | ORAL_CAPSULE | Freq: Every day | ORAL | 0 refills | Status: AC
  Filled 2024-07-21: qty 90, 90d supply, fill #0

## 2024-07-21 MED ORDER — LOSARTAN POTASSIUM 100 MG PO TABS
100.0000 mg | ORAL_TABLET | Freq: Every day | ORAL | 0 refills | Status: AC
  Filled 2024-07-21: qty 30, 30d supply, fill #0

## 2024-07-21 MED ORDER — ATORVASTATIN CALCIUM 20 MG PO TABS
20.0000 mg | ORAL_TABLET | Freq: Every day | ORAL | 0 refills | Status: AC
  Filled 2024-07-21: qty 30, 30d supply, fill #0

## 2024-07-23 ENCOUNTER — Other Ambulatory Visit (HOSPITAL_BASED_OUTPATIENT_CLINIC_OR_DEPARTMENT_OTHER): Payer: Self-pay
